# Patient Record
Sex: Male | Born: 1935 | Race: White | Hispanic: No | Marital: Married | Smoking: Never smoker
Health system: Southern US, Community
[De-identification: ages and names within clinical notes are randomized; demographics above are authoritative.]

## PROBLEM LIST (undated history)

## (undated) DIAGNOSIS — G4730 Sleep apnea, unspecified: Secondary | ICD-10-CM

## (undated) DIAGNOSIS — M1990 Unspecified osteoarthritis, unspecified site: Secondary | ICD-10-CM

## (undated) DIAGNOSIS — H409 Unspecified glaucoma: Secondary | ICD-10-CM

## (undated) DIAGNOSIS — Z91018 Allergy to other foods: Secondary | ICD-10-CM

## (undated) DIAGNOSIS — M109 Gout, unspecified: Secondary | ICD-10-CM

## (undated) DIAGNOSIS — M810 Age-related osteoporosis without current pathological fracture: Secondary | ICD-10-CM

## (undated) DIAGNOSIS — G629 Polyneuropathy, unspecified: Secondary | ICD-10-CM

## (undated) DIAGNOSIS — I10 Essential (primary) hypertension: Secondary | ICD-10-CM

## (undated) DIAGNOSIS — I509 Heart failure, unspecified: Secondary | ICD-10-CM

## (undated) DIAGNOSIS — I4891 Unspecified atrial fibrillation: Secondary | ICD-10-CM

## (undated) DIAGNOSIS — C801 Malignant (primary) neoplasm, unspecified: Secondary | ICD-10-CM

## (undated) DIAGNOSIS — K219 Gastro-esophageal reflux disease without esophagitis: Secondary | ICD-10-CM

## (undated) DIAGNOSIS — R011 Cardiac murmur, unspecified: Secondary | ICD-10-CM

## (undated) DIAGNOSIS — I350 Nonrheumatic aortic (valve) stenosis: Secondary | ICD-10-CM

## (undated) DIAGNOSIS — J45909 Unspecified asthma, uncomplicated: Secondary | ICD-10-CM

## (undated) HISTORY — DX: Age-related osteoporosis without current pathological fracture: M81.0

## (undated) HISTORY — DX: Polyneuropathy, unspecified: G62.9

## (undated) HISTORY — DX: Gout, unspecified: M10.9

## (undated) HISTORY — DX: Unspecified glaucoma: H40.9

## (undated) HISTORY — DX: Unspecified atrial fibrillation: I48.91

## (undated) HISTORY — DX: Malignant (primary) neoplasm, unspecified: C80.1

## (undated) HISTORY — DX: Gastro-esophageal reflux disease without esophagitis: K21.9

## (undated) HISTORY — DX: Unspecified asthma, uncomplicated: J45.909

## (undated) HISTORY — PX: JOINT REPLACEMENT: SHX530

## (undated) HISTORY — PX: OTHER SURGICAL HISTORY: SHX169

## (undated) HISTORY — DX: Unspecified osteoarthritis, unspecified site: M19.90

## (undated) HISTORY — DX: Essential (primary) hypertension: I10

## (undated) HISTORY — PX: ARTHROPLASTY: SHX135

## (undated) HISTORY — PX: BUNIONECTOMY: SHX129

---

## 2000-03-30 DIAGNOSIS — C801 Malignant (primary) neoplasm, unspecified: Secondary | ICD-10-CM

## 2000-03-30 HISTORY — DX: Malignant (primary) neoplasm, unspecified: C80.1

## 2005-05-28 ENCOUNTER — Ambulatory Visit: Payer: Self-pay | Admitting: *Deleted

## 2007-01-25 ENCOUNTER — Ambulatory Visit: Payer: Self-pay | Admitting: *Deleted

## 2007-05-02 ENCOUNTER — Ambulatory Visit: Payer: Self-pay | Admitting: *Deleted

## 2009-09-24 ENCOUNTER — Ambulatory Visit: Payer: Self-pay | Admitting: Orthopedic Surgery

## 2009-12-26 ENCOUNTER — Ambulatory Visit: Payer: Self-pay | Admitting: Neurology

## 2009-12-31 ENCOUNTER — Encounter: Admission: RE | Admit: 2009-12-31 | Discharge: 2009-12-31 | Payer: Self-pay | Admitting: Neurology

## 2010-06-04 ENCOUNTER — Ambulatory Visit: Payer: Self-pay | Admitting: Family Medicine

## 2010-07-19 ENCOUNTER — Ambulatory Visit: Payer: Self-pay | Admitting: Internal Medicine

## 2010-09-08 ENCOUNTER — Ambulatory Visit: Payer: Self-pay | Admitting: Unknown Physician Specialty

## 2010-09-09 LAB — PATHOLOGY REPORT

## 2011-02-23 ENCOUNTER — Ambulatory Visit: Payer: Self-pay | Admitting: Ophthalmology

## 2011-02-23 DIAGNOSIS — I4891 Unspecified atrial fibrillation: Secondary | ICD-10-CM

## 2011-02-25 ENCOUNTER — Ambulatory Visit: Payer: Self-pay | Admitting: Ophthalmology

## 2011-05-01 ENCOUNTER — Ambulatory Visit: Payer: Self-pay | Admitting: Family Medicine

## 2011-05-13 ENCOUNTER — Ambulatory Visit: Payer: Self-pay | Admitting: Ophthalmology

## 2012-05-18 ENCOUNTER — Ambulatory Visit: Payer: Self-pay | Admitting: Family Medicine

## 2012-06-06 ENCOUNTER — Ambulatory Visit: Payer: Self-pay | Admitting: Family Medicine

## 2012-06-27 ENCOUNTER — Ambulatory Visit: Payer: Self-pay | Admitting: General Surgery

## 2012-09-22 ENCOUNTER — Ambulatory Visit: Payer: Self-pay | Admitting: Family Medicine

## 2013-07-13 ENCOUNTER — Encounter: Payer: Self-pay | Admitting: Otolaryngology

## 2013-07-28 ENCOUNTER — Encounter: Payer: Self-pay | Admitting: Otolaryngology

## 2014-01-29 ENCOUNTER — Ambulatory Visit: Payer: Self-pay | Admitting: Family Medicine

## 2014-07-22 NOTE — Op Note (Signed)
PATIENT NAME:  Donald Riley, Donald Riley MR#:  [id number] DATE OF BIRTH:  [birth year]  DATE OF PROCEDURE:  05/13/2011  PROCEDURES PERFORMED:  1. Pars plana vitrectomy of the right eye.  2. Internal limiting membrane peel of the right eye.  3. Focal retinal laser of the right eye.  4. Phacoemulsification intraocular lens insertion of the right eye.   PREOPERATIVE DIAGNOSES:  1. Epiretinal membrane of the right eye.  2. Retinal edema of the right eye.  3. Visually significant cataract, right eye.   POSTOPERATIVE DIAGNOSES:  1. Epiretinal membrane of the right eye.  2. Retinal edema of the right eye.  3. Visually significant cataract, right eye.  4. Retinal tear.   SURGEONS: 1. Teresa Pelton. Natia Fahmy, MD for vitrectomy, retinal laser and internal limiting membrane peel.  2. Leandrew Koyanagi, M.D. for phacoemulsification, intraocular lens insertion.   ANESTHESIA: Retrobulbar block of the right eye with monitored anesthesia care.   COMPLICATIONS: None.   ESTIMATED BLOOD LOSS: Less than 1 mL.   INDICATIONS FOR PROCEDURE: This is a patient who has slowly decreasing vision in his right eye. Examination reveals retinal edema associated with a macular pucker and epiretinal membrane. Examination also reveals a visually significant cataract. Risks, benefits and alteratives of the above procedure were discussed and the patient wished to proceed.   DETAILS OF PROCEDURE: After informed consent was obtained, the patient was brought into operative suite at [facility]. Patient was placed in supine position, was given a small dose of Alfenta and a retrobulbar block was performed on the right eye by Dr. Starling Manns without any complications. Right eye was prepped and draped by Dr. Wallace Going. After lid speculum was inserted phacoemulsification, intraocular lens insertion was performed by Dr. Wallace Going which he will dictate in a separate note.   Once the cataract surgery was completed,  attention was turned to the pars plana vitrectomy portion of the case. A 25-gauge trocar was placed inferotemporally through displaced conjunctiva in an oblique fashion 3 mm beyond the limbus. The infusion cannula was turned on and inserted through the trocar and secured into position with Steri-Strips. Two more trocars were placed in a similar fashion superotemporally and superonasally. The vitreous cutter and light pipe were introduced in the eye and a core vitrectomy was performed. Peripheral vitreous was trimmed for 360 degrees. At this time, it was noted that there was a very small retinal tear at approximately 11:30. Peripheral vitreous continued to be trimmed for 360 degrees. Once this was completed indocyanine green was injected onto the posterior and removed within 30 seconds. Epiretinal membrane was peeled for 360 degrees. Internal limiting membrane was then peeled for 360 degrees around the fovea. A scleral depressed exam was then performed for 360 degrees and only the original small retinal tear could be identified. Endolaser was introduced in the eye and three rows of laser was placed around this tear. A complete air-fluid exchange was then performed. All the trocars were removed and the wounds were noted to be airtight. Pressure was confirmed to be approximately 15 mmHg. The Toric lens was examined and its markings noted that it was in its perfect measured position. 5 mg of dexamethasone was given into the inferior fornix and the lid speculum was removed. The eye was cleaned and TobraDex was placed on the eye. A patch and shield were placed over the eye and the patient was taken to postanesthesia care with instructions to remain head up overnight.   ____________________________ Teresa Pelton. Starling Manns, MD mfa:cms  D: 05/13/2011 12:11:48 ET T: 05/13/2011 12:38:31 ET JOB#: [id number]  cc: Teresa Pelton. Starling Manns, MD, <Dictator> Coralee Rud MD ELECTRONICALLY SIGNED 05/20/2011 7:11

## 2014-07-22 NOTE — Op Note (Signed)
PATIENT NAME:  Donald Riley, Donald Riley MR#:  [id number] DATE OF BIRTH:  1933-11-02  DATE OF PROCEDURE:  05/13/2011  PREOPERATIVE DIAGNOSIS:  Senile cataract of the right eye.  POSTOPERATIVE DIAGNOSIS:  Senile cataract of the right eye.  PROCEDURE:  Phacoemulsification with posterior chamber with Toric intraocular lens placement of the right eye.   LENS:  SN6AT3 12.0-diopter posterior chamber Toric intraocular lens with 1.5 diopters of cylindrical power placed at axis 105 degrees.  ULTRASOUND TIME:  7.6% of 1 minute, 35 seconds, CDE 7.3.   SURGEON:  Mali Keimora Swartout, MD  ANESTHESIA:  Topical with tetracaine drops and 2% Xylocaine jelly.  COMPLICATIONS:  None.  DESCRIPTION OF PROCEDURE:  The patient was identified in the holding room and transported to the operating suite and placed in upright position.  The eye was marked so that the 0, 90 and 180 degree axis were clearly marked with purple ink.  Xylocaine gel was placed into the eye and then the eye was prepped and draped in the usual sterile ophthalmic fashion.  The operating microscope was placed into position.   A clear-corneal paracentesis incision was made at the 3 o'clock position.  The anterior chamber was filled with Viscoat.  A 2.6-millimeter near clear corneal incision was then made at the 12 o'clock position. A cystotome and capsulorrhexis forceps were then used to make a curvilinear capsulorrhexis.  Hydrodissect and hydrodelineation were then performed using balanced salt solution.   Phacoemulsification was then used in stop and chop fashion to remove the lens, nucleus and epinucleus.  The remaining cortex was aspirated using the irrigation and aspiration handpiece.  Provisc viscoelastic was then placed into the capsular bag to distend it for lens placement.  An axis marker was then dipped in purple ink and used to mark the axis position of 105 degrees for placement of the toric intraocular lens.  The 105 degree axis has been previously  determined by the toric lens calculator.   A SN6AT3 12.0-diopter lens was then injected into the capsular bag.  It was rotated clockwise until the axis marks on the lens were approximately 15 degrees in the counterclockwise direction to the 105 degree axis mark on the cornea.  The viscoelastic was aspirated from the eye using the irrigation aspiration handpiece.  Then, a Sinskey hook through the side-port incision was used to rotate the lens in a clockwise direction until the axis markings of the intraocular lens were lined up with the axis markings on the cornea.  Balanced salt solution was then used to hydrate the wounds.   The eye was noted to have a physiologic pressure and there was no wound leak noted.  A 10-0 nylon suture was placed through the center of the 2.6-mm incision. Upon completion of the cataract extraction and intraocular lens, Dr. Starling Manns then performed a posterior vitrectomy in its entirety. This is dictated separately by Dr. Starling Manns.   ____________________________ Wyonia Hough, MD crb:drc D: 05/13/2011 12:18:19 ET T: 05/13/2011 12:40:49 ET JOB#: [id number]  cc: Wyonia Hough, MD, <Dictator> Leandrew Koyanagi MD ELECTRONICALLY SIGNED 05/20/2011 13:07

## 2014-09-20 ENCOUNTER — Other Ambulatory Visit: Payer: Self-pay | Admitting: Physical Medicine and Rehabilitation

## 2014-09-20 DIAGNOSIS — M5416 Radiculopathy, lumbar region: Secondary | ICD-10-CM

## 2014-09-26 ENCOUNTER — Ambulatory Visit
Admission: RE | Admit: 2014-09-26 | Discharge: 2014-09-26 | Disposition: A | Discharge status: Home / Self Care | Payer: Medicare Other | Source: Ambulatory Visit | Attending: Physical Medicine and Rehabilitation | Admitting: Physical Medicine and Rehabilitation

## 2014-09-26 DIAGNOSIS — M4696 Unspecified inflammatory spondylopathy, lumbar region: Secondary | ICD-10-CM | POA: Diagnosis not present

## 2014-09-26 DIAGNOSIS — M5136 Other intervertebral disc degeneration, lumbar region: Secondary | ICD-10-CM | POA: Insufficient documentation

## 2014-09-26 DIAGNOSIS — M4697 Unspecified inflammatory spondylopathy, lumbosacral region: Secondary | ICD-10-CM | POA: Diagnosis not present

## 2014-09-26 DIAGNOSIS — M5416 Radiculopathy, lumbar region: Secondary | ICD-10-CM | POA: Diagnosis present

## 2014-09-26 DIAGNOSIS — M4806 Spinal stenosis, lumbar region: Secondary | ICD-10-CM | POA: Insufficient documentation

## 2014-12-25 ENCOUNTER — Encounter: Payer: Self-pay | Admitting: Podiatry

## 2014-12-25 ENCOUNTER — Ambulatory Visit (INDEPENDENT_AMBULATORY_CARE_PROVIDER_SITE_OTHER): Payer: Medicare Other | Admitting: Podiatry

## 2014-12-25 VITALS — BP 124/84 | HR 64 | Resp 18

## 2014-12-25 DIAGNOSIS — B079 Viral wart, unspecified: Secondary | ICD-10-CM | POA: Diagnosis not present

## 2014-12-25 DIAGNOSIS — L84 Corns and callosities: Secondary | ICD-10-CM

## 2014-12-25 DIAGNOSIS — B078 Other viral warts: Secondary | ICD-10-CM

## 2014-12-25 NOTE — Progress Notes (Signed)
   Subjective:    Patient ID: Donald Riley, male    DOB: 1933/12/08, 81 y.o.   MRN: [id number]  HPI  81 year old male presents the office with concerns of a callus in the ball of his left foot is been ongoing for greater than one year. He states he gets a pedicure typically has the area trim down. He has pain to the area as it builds up. He previously had a bunionectomy performed approximately 20 years ago. He denies any redness or drainage from the area. No other complaints at this time.    Review of Systems  Constitutional: Positive for activity change and fatigue.  HENT: Positive for hearing loss.        RINGING IN EARS  Genitourinary:       BLOOD IN URINE  Musculoskeletal: Positive for back pain and gait problem.       MUSCLE AND JOINT PAIN  Hematological: Bruises/bleeds easily.  All other systems reviewed and are negative.      Objective:   Physical Exam AAO x3, NAD DP/PT pulses palpable bilaterally, CRT less than 3 seconds Protective sensation decreased with Simms Weinstein monofilament There is a hyperkeratotic lesion left foot submetatarsal one. Upon debridement no underlying ulceration, drainage or other signs of infection. There is a scar from prior bunionectomy. Hallux it and abducted position the decreased range of motion and plantar flexion. There is good range of motion dorsiflexion of the first MTPJ. There is no swelling erythema or drainage. The distal aspect of the left fourth toe is hyperkeratotic lesion with pinpoint bleeding and evidence of verruca. There is no drainage or purulence. There is mostly erythema or pain to the lesion. No other lesions identified bilaterally. No areas of tenderness to bilateral lower extremities. MMT 5/5, ROM WNL.  No open lesions or pre-ulcerative lesions.  No overlying edema, erythema, increase in warmth to bilateral lower extremities.  No pain with calf compression, swelling, warmth, erythema bilaterally.      Assessment & Plan:    81 year old male left foot submetatarsal 1 hyperkeratotic lesion, fourth toe verruca -Treatment options discussed including all alternatives, risks, and complications -hyperkeratotic lesion sharply debrided x1 left foot submetatarsal one. Offloading pads applied to the patient's orthotic. Dispense other offloading pads. May need to consider new orthotics in the near future. -Left fourth toe lesion sharply debrided without complications. There is evidence of underlying verruca. Area was cleaned. A pad was place around the the lesion followed by salinocaine and a bandage. Post procedure tear was discussed the patient. The lesion does not heal within 2 weeks or call the office for followup appointment. Monitor for any clinical signs or symptoms of infection and directed to call the office immediately should any occur or go to the ER. Follow-up with PCP for other issues mentioned in the ROS.   Celesta Gentile, DPM

## 2014-12-26 ENCOUNTER — Encounter: Payer: Self-pay | Admitting: Podiatry

## 2015-06-12 ENCOUNTER — Other Ambulatory Visit: Payer: Self-pay | Admitting: Cardiology

## 2015-07-02 ENCOUNTER — Encounter: Payer: Self-pay | Admitting: *Deleted

## 2015-07-02 ENCOUNTER — Ambulatory Visit
Admission: RE | Admit: 2015-07-02 | Discharge: 2015-07-02 | Disposition: A | Discharge status: Home / Self Care | Payer: Medicare Other | Source: Ambulatory Visit | Attending: Cardiology | Admitting: Cardiology

## 2015-07-02 ENCOUNTER — Encounter
Admission: RE | Disposition: A | Discharge status: Home / Self Care | Payer: Self-pay | Source: Ambulatory Visit | Attending: Cardiology

## 2015-07-02 DIAGNOSIS — I2510 Atherosclerotic heart disease of native coronary artery without angina pectoris: Secondary | ICD-10-CM | POA: Diagnosis not present

## 2015-07-02 DIAGNOSIS — K449 Diaphragmatic hernia without obstruction or gangrene: Secondary | ICD-10-CM | POA: Diagnosis not present

## 2015-07-02 DIAGNOSIS — I4891 Unspecified atrial fibrillation: Secondary | ICD-10-CM | POA: Insufficient documentation

## 2015-07-02 DIAGNOSIS — I350 Nonrheumatic aortic (valve) stenosis: Secondary | ICD-10-CM | POA: Insufficient documentation

## 2015-07-02 DIAGNOSIS — J4520 Mild intermittent asthma, uncomplicated: Secondary | ICD-10-CM | POA: Insufficient documentation

## 2015-07-02 DIAGNOSIS — Z7901 Long term (current) use of anticoagulants: Secondary | ICD-10-CM | POA: Insufficient documentation

## 2015-07-02 DIAGNOSIS — G629 Polyneuropathy, unspecified: Secondary | ICD-10-CM | POA: Insufficient documentation

## 2015-07-02 DIAGNOSIS — I10 Essential (primary) hypertension: Secondary | ICD-10-CM | POA: Diagnosis not present

## 2015-07-02 DIAGNOSIS — R011 Cardiac murmur, unspecified: Secondary | ICD-10-CM | POA: Insufficient documentation

## 2015-07-02 HISTORY — PX: CARDIAC CATHETERIZATION: SHX172

## 2015-07-02 HISTORY — DX: Cardiac murmur, unspecified: R01.1

## 2015-07-02 HISTORY — DX: Sleep apnea, unspecified: G47.30

## 2015-07-02 SURGERY — RIGHT/LEFT HEART CATH AND CORONARY ANGIOGRAPHY
Anesthesia: Moderate Sedation

## 2015-07-02 MED ORDER — SODIUM CHLORIDE 0.9 % WEIGHT BASED INFUSION: 3.0000 mL/kg/h | INTRAVENOUS | Status: DC

## 2015-07-02 MED ORDER — HEPARIN (PORCINE) IN NACL 2-0.9 UNIT/ML-% IJ SOLN
INTRAMUSCULAR | Status: AC
  Filled 2015-07-02: qty 500

## 2015-07-02 MED ORDER — MIDAZOLAM HCL 2 MG/2ML IJ SOLN
INTRAMUSCULAR | Status: DC | PRN
  Administered 2015-07-02: 0.5 mg via INTRAVENOUS
  Administered 2015-07-02: 1 mg via INTRAVENOUS

## 2015-07-02 MED ORDER — SODIUM CHLORIDE 0.9% FLUSH: 3.0000 mL | INTRAVENOUS | Status: DC | PRN

## 2015-07-02 MED ORDER — MIDAZOLAM HCL 2 MG/2ML IJ SOLN
INTRAMUSCULAR | Status: AC
  Filled 2015-07-02: qty 2

## 2015-07-02 MED ORDER — SODIUM CHLORIDE 0.9% FLUSH
3.0000 mL | Freq: Two times a day (BID) | INTRAVENOUS | Status: DC
  Administered 2015-07-02: 3 mL via INTRAVENOUS

## 2015-07-02 MED ORDER — SODIUM CHLORIDE 0.9% FLUSH: 3.0000 mL | Freq: Two times a day (BID) | INTRAVENOUS | Status: DC

## 2015-07-02 MED ORDER — SODIUM CHLORIDE 0.9 % WEIGHT BASED INFUSION
1.0000 mL/kg/h | INTRAVENOUS | Status: DC
  Administered 2015-07-02: 1 mL/kg/h via INTRAVENOUS

## 2015-07-02 MED ORDER — FENTANYL CITRATE (PF) 100 MCG/2ML IJ SOLN
INTRAMUSCULAR | Status: AC
  Filled 2015-07-02: qty 2

## 2015-07-02 MED ORDER — ASPIRIN 81 MG PO CHEW: 81.0000 mg | CHEWABLE_TABLET | ORAL | Status: DC

## 2015-07-02 MED ORDER — SODIUM CHLORIDE 0.9 % IV SOLN: 250.0000 mL | INTRAVENOUS | Status: DC | PRN

## 2015-07-02 MED ORDER — IOPAMIDOL (ISOVUE-300) INJECTION 61%
INTRAVENOUS | Status: DC | PRN
  Administered 2015-07-02: 100 mL via INTRA_ARTERIAL

## 2015-07-02 MED ORDER — FENTANYL CITRATE (PF) 100 MCG/2ML IJ SOLN
INTRAMUSCULAR | Status: DC | PRN
  Administered 2015-07-02 (×2): 25 ug via INTRAVENOUS

## 2015-07-02 SURGICAL SUPPLY — 14 items
CATH INFINITI 5FR ANG PIGTAIL (CATHETERS) ×3 IMPLANT
CATH INFINITI 5FR JL4 (CATHETERS) ×3 IMPLANT
CATH INFINITI JR4 5F (CATHETERS) ×3 IMPLANT
CATH SWANZ 7F THERMO (CATHETERS) ×3 IMPLANT
GUIDEWIRE EMER 3M J .025X150CM (WIRE) ×3 IMPLANT
KIT MANI 3VAL PERCEP (MISCELLANEOUS) ×3 IMPLANT
KIT RIGHT HEART (MISCELLANEOUS) ×3 IMPLANT
NEEDLE PERC 18GX7CM (NEEDLE) ×3 IMPLANT
PACK CARDIAC CATH (CUSTOM PROCEDURE TRAY) ×3 IMPLANT
SHEATH AVANTI 6FR X 11CM (SHEATH) ×3 IMPLANT
SHEATH PINNACLE 5F 10CM (SHEATH) IMPLANT
SHEATH PINNACLE 7F 10CM (SHEATH) ×3 IMPLANT
WIRE EMERALD 3MM-J .035X150CM (WIRE) ×3 IMPLANT
WIRE EMERALD ST .035X150CM (WIRE) ×3 IMPLANT

## 2015-07-02 NOTE — H&P (Signed)
normal pulsation bilaterally Bruit: None Apex: apical impulse normal  Auscultation Rhythm: normal sinus rhythm S1: normal S2: normal Clicks: no Rub: no Murmurs: 2-3/6 medium pitched mid systolic musical at lower left sternal border  Gallop: None ABDOMEN Liver enlargement: no Pulsatile aorta: no Ascites: no Bruits: no  EXTREMITIES Clubbing: no Edema: 1+ bilateral pedal edema Pulses: peripheral pulses symmetrical Femoral Bruits: no Amputation: no SKIN Rash: no Cyanosis: no Embolic phemonenon: no Bruising: no NEURO Alert and Oriented: yes Non focal: yes LABS Last 3 CBC results: Lab Results  Component Value Date  WBC 6.5 12/06/2013  WBC 7.0 06/20/2013  WBC 15.0 (H) 05/31/2013   Lab Results  Component Value Date  HGB 14.6 12/06/2013  HGB 15.4 06/20/2013  HGB 15.8 05/31/2013   Lab Results  Component Value Date  HCT 0.44 12/06/2013  HCT 0.48 06/20/2013  HCT 0.49 05/31/2013   Lab Results  Component Value Date  MCV 100 (H) 12/06/2013  MCV 102 (H) 06/20/2013  MCV 102 (H) 05/31/2013   Lab Results  Component Value Date  PLT 193 12/06/2013  PLT 182 06/20/2013  PLT 224 05/31/2013   Lab Results  Component Value Date  CREATININE 0.9 03/15/2015  BUN 16 03/15/2015  NA 139 03/15/2015  K 4.8 03/15/2015  CL 103 03/15/2015  CO2 30 03/15/2015   Lab Results  Component Value Date  HDL 103 03/15/2015  HDL 102 01/24/2014  HDL 109 03/09/2013   Lab Results  Component Value Date  LDLCALC 126 03/15/2015  LDLCALC 122 01/24/2014  LDLCALC 81 03/09/2013   Lab Results  Component Value Date  TRIG 58 03/15/2015  TRIG 53 01/24/2014  TRIG 54 03/09/2013   Lab Results  Component Value Date  ALT 18 03/15/2015  AST 20 03/15/2015  ALKPHOS 80 03/15/2015   Lab Results  Component Value Date  TSH 0.99 12/06/2013   Assessment   82 y.o. male with  1. A-fib  2. Atrial fibrillation  Atrial fibrillation rate is well controlled. He remains in sinus rhythm. He is  tolerating current medical regimen including flecainide without any difficulty. He has had no breakthrough arrhythmias. His rate is controlled with diltiazem. He remains on Eliquis for anticoagulation. Will need to come off Eliquis 2 days prior to left cardiac cath.  3. Essential hypertension, benign  Blood pressure is under control  4. Essential hypertension  5. Asthma, mild intermittent, uncomplicated  6. Hiatal hernia  7. Peripheral neuropathy  8. Systolic murmur-echo shows evidence of moderate aortic stenosis. We will proceed with left and right heart cath to better assess the aortic valve anatomy and to evaluate for evidence of coronary disease to guide further therapy to consider S AVR versus T aVR.  Plan   1. Continue with current medications 2. Low fat low cholesterol sodium diet 3. Receive a left to right heart cath to evaluate coronary anatomy and better evaluation of the aortic stenosis. 4. Avoid activity causing symptoms 5. Continue with Eliquis at current dose, will stop 2 days prior to cardiac catheterization 6. Continue with flecainide and metoprolol  Orders Placed This Encounter  Procedures  . CBC w/auto Differential (5 Part)  . Basic Metabolic Panel (BMP)  . Prothrombin Time (INR)   No Follow-up on file.  These notes generated with voice recognition software. I apologize for typographical errors.  Sydnee Levans, MD

## 2015-07-02 NOTE — Discharge Instructions (Signed)

## 2016-01-13 ENCOUNTER — Encounter: Payer: Self-pay | Admitting: Emergency Medicine

## 2016-01-13 ENCOUNTER — Emergency Department
Admission: EM | Admit: 2016-01-13 | Discharge: 2016-01-14 | Payer: Medicare Other | Attending: Student in an Organized Health Care Education/Training Program | Admitting: Student in an Organized Health Care Education/Training Program

## 2016-01-13 DIAGNOSIS — J45909 Unspecified asthma, uncomplicated: Secondary | ICD-10-CM | POA: Insufficient documentation

## 2016-01-13 DIAGNOSIS — K625 Hemorrhage of anus and rectum: Secondary | ICD-10-CM | POA: Diagnosis present

## 2016-01-13 DIAGNOSIS — Z791 Long term (current) use of non-steroidal anti-inflammatories (NSAID): Secondary | ICD-10-CM | POA: Insufficient documentation

## 2016-01-13 DIAGNOSIS — Z7952 Long term (current) use of systemic steroids: Secondary | ICD-10-CM | POA: Insufficient documentation

## 2016-01-13 DIAGNOSIS — K922 Gastrointestinal hemorrhage, unspecified: Secondary | ICD-10-CM | POA: Insufficient documentation

## 2016-01-13 DIAGNOSIS — Z859 Personal history of malignant neoplasm, unspecified: Secondary | ICD-10-CM | POA: Diagnosis not present

## 2016-01-13 DIAGNOSIS — Z79899 Other long term (current) drug therapy: Secondary | ICD-10-CM | POA: Insufficient documentation

## 2016-01-13 LAB — TYPE AND SCREEN
ABO/RH(D): B POS
Antibody Screen: NEGATIVE

## 2016-01-13 LAB — CBC WITH DIFFERENTIAL/PLATELET
Basophils Absolute: 0.1 10*3/uL (ref 0–0.1)
Basophils Relative: 1 %
Eosinophils Absolute: 0.2 10*3/uL (ref 0–0.7)
Eosinophils Relative: 2 %
HCT: 39.7 % — ABNORMAL LOW (ref 40.0–52.0)
Hemoglobin: 13.3 g/dL (ref 13.0–18.0)
Lymphocytes Relative: 19 %
Lymphs Abs: 1.5 10*3/uL (ref 1.0–3.6)
MCH: 32.9 pg (ref 26.0–34.0)
MCHC: 33.4 g/dL (ref 32.0–36.0)
MCV: 98.4 fL (ref 80.0–100.0)
Monocytes Absolute: 0.8 10*3/uL (ref 0.2–1.0)
Monocytes Relative: 10 %
Neutro Abs: 5.5 10*3/uL (ref 1.4–6.5)
Neutrophils Relative %: 68 %
Platelets: 148 10*3/uL — ABNORMAL LOW (ref 150–440)
RBC: 4.03 MIL/uL — ABNORMAL LOW (ref 4.40–5.90)
RDW: 14.2 % (ref 11.5–14.5)
WBC: 8 10*3/uL (ref 3.8–10.6)

## 2016-01-13 LAB — COMPREHENSIVE METABOLIC PANEL
ALT: 16 U/L — ABNORMAL LOW (ref 17–63)
AST: 22 U/L (ref 15–41)
Albumin: 3.3 g/dL — ABNORMAL LOW (ref 3.5–5.0)
Alkaline Phosphatase: 71 U/L (ref 38–126)
Anion gap: 6 (ref 5–15)
BUN: 32 mg/dL — ABNORMAL HIGH (ref 6–20)
CO2: 24 mmol/L (ref 22–32)
Calcium: 8.7 mg/dL — ABNORMAL LOW (ref 8.9–10.3)
Chloride: 108 mmol/L (ref 101–111)
Creatinine, Ser: 1.01 mg/dL (ref 0.61–1.24)
GFR calc Af Amer: >60 mL/min (ref >60)
GFR calc non Af Amer: >60 mL/min (ref >60)
Glucose, Bld: 134 mg/dL — ABNORMAL HIGH (ref 65–99)
Potassium: 4.6 mmol/L (ref 3.5–5.1)
Sodium: 138 mmol/L (ref 135–145)
Total Bilirubin: 0.6 mg/dL (ref 0.3–1.2)
Total Protein: 5.8 g/dL — ABNORMAL LOW (ref 6.5–8.1)

## 2016-01-13 LAB — LACTIC ACID, PLASMA: Lactic Acid, Venous: 1.4 mmol/L (ref 0.5–1.9)

## 2016-01-13 LAB — PROTIME-INR
INR: 1.27
Prothrombin Time: 16 seconds — ABNORMAL HIGH (ref 11.4–15.2)

## 2016-01-13 MED ORDER — SODIUM CHLORIDE 0.9 % IV SOLN
80.0000 mg | Freq: Once | INTRAVENOUS | Status: AC
  Administered 2016-01-13: 80 mg via INTRAVENOUS
  Filled 2016-01-13: qty 80

## 2016-01-13 NOTE — ED Notes (Signed)
Pt comes in reporting bright red rectal bleeding that started at approximately 1830 this evening. Pt denies any abd pain, but reports feeling a "gurgling" sensation in his lower abd. Pt is tender to palpation in the lower quadrants. Pt reports that he has lost count of how many bloody stools he has had prior to coming in. Pt denies any chest pain, SOB, dizziness, N/V.

## 2016-01-13 NOTE — ED Triage Notes (Addendum)
Pt arrived to triage by wheelchair from bathroom with EDT. Pt filled toilet with bright red blood, with clots, assessed/seen by this RN. Pt taken to RM 17. Pt sts bleeding started this afternoon and denies any other symptom. Pt currently taking Eliquis.

## 2016-01-13 NOTE — ED Provider Notes (Signed)
[facility] Emergency Department Provider Note    First MD Initiated Contact with Patient 01/13/16 2127     (approximate)  I have reviewed the triage vital signs and the nursing notes.   HISTORY  Chief Complaint Rectal Bleeding    HPI Donald Riley is a 82 y.o. male  With a history  of Diverticulosis  As well a atrial fibillation on Eliquis.   Who presents with acute left lower quadrant pain and bright red blood per rectum.  Patient denies any recent fevers. Denies any nausea or vomiting. States the symptoms started at just after 6 PM. Has had over 8 episodes of bright red blood per rectum that fill of the toilet and have associated clots.  Patient went to the bathroom here in the ED and filled the commode with sanguinous stool.  Past Medical History:  Diagnosis Date  . Arthritis   . Asthma   . Atrial fibrillation ([place])   . Cancer ([place])   . GERD (gastroesophageal reflux disease)   . Glaucoma   . Gout   . Heart murmur   . Hypertension   . Neuropathy ([place])   . Osteoporosis   . Sleep apnea     There are no active problems to display for this patient.   Past Surgical History:  Procedure Laterality Date  . ARTHROPLASTY Right    ankle  . BUNIONECTOMY    . CARDIAC CATHETERIZATION N/A 07/02/2015   Procedure: Right/Left Heart Cath and Coronary Angiography;  Surgeon: Teodoro Spray, MD;  Location: [place] [facility];  Service: Cardiovascular;  Laterality: N/A;  . JOINT REPLACEMENT    . ostatectomy      Prior to Admission medications   Medication Sig Start Date End Date Taking? Authorizing Provider  albuterol (VENTOLIN HFA) 108 (90 BASE) MCG/ACT inhaler Inhale into the lungs every 4 (four) hours as needed.  01/30/14   Historical Provider, MD  alendronate (FOSAMAX) 70 MG tablet Take 70 mg by mouth. Reported on 07/02/2015    Historical Provider, MD  apixaban (ELIQUIS) 5 MG TABS tablet Take by mouth. 07/18/14   Historical Provider, MD  ARTIFICIAL TEARS  0.1-0.3 % SOLN Reported on 07/02/2015 05/19/12   Historical Provider, MD  beta carotene w/minerals (OCUVITE) tablet Take 1 tablet by mouth.    Historical Provider, MD  brimonidine-timolol (COMBIGAN) 0.2-0.5 % ophthalmic solution 1 drop 2 (two) times daily.    Historical Provider, MD  Calcium Carb-Cholecalciferol (CALCIUM PLUS VITAMIN D3) 600-500 MG-UNIT CAPS Take by mouth 2 (two) times daily.  05/19/12   Historical Provider, MD  colchicine (COLCRYS) 0.6 MG tablet Take 0.6 mg by mouth. 01/31/13   Historical Provider, MD  diltiazem (CARDIZEM CD) 240 MG 24 hr capsule Reported on 07/02/2015 12/19/14   Historical Provider, MD  flecainide (TAMBOCOR) 50 MG tablet Take 100 mg by mouth. Reported on 07/02/2015 05/19/12   Historical Provider, MD  glucosamine-chondroitin (GLUCOSAMINE-CHONDROITIN DS) 500-400 MG tablet Take by mouth.    Historical Provider, MD  Hydrocodone-Acetaminophen 5-300 MG TABS Take by mouth.    Historical Provider, MD  ketotifen (ZADITOR) 0.025 % ophthalmic solution  05/19/12   Historical Provider, MD  latanoprost (XALATAN) 0.005 % ophthalmic solution Reported on 07/02/2015 01/30/13   Historical Provider, MD  losartan (COZAAR) 100 MG tablet Take 100 mg by mouth. Reported on 07/02/2015 05/28/11   Historical Provider, MD  Lutein 20 MG CAPS Take 20 mg by mouth. 05/19/12   Historical Provider, MD  meloxicam (MOBIC) 15 MG  tablet Take 15 mg by mouth. Reported on 07/02/2015 05/28/11   Historical Provider, MD  metoprolol succinate (TOPROL-XL) 25 MG 24 hr tablet Take 25 mg by mouth. Reported on 07/02/2015 05/19/12   Historical Provider, MD  mometasone [facility]) 220 MCG/INH inhaler Inhale 2 puffs into the lungs daily.    Historical Provider, MD  mometasone-formoterol (DULERA) 200-5 MCG/ACT AERO Inhale into the lungs. Reported on 07/02/2015 01/30/14   Historical Provider, MD  pantoprazole (PROTONIX) 40 MG tablet Take 40 mg by mouth. Reported on 07/02/2015 05/19/12   Historical Provider, MD  pramipexole (MIRAPEX) 0.25 MG tablet  Take 0.25 mg by mouth.     Historical Provider, MD  predniSONE (DELTASONE) 20 MG tablet For asthma flare take 60 mg/day for 5 days, 40 mg/d x 5 days, 120 mg/d x 5 days and 10 mg/d x 5 days. 08/01/13   Historical Provider, MD  ranitidine (ZANTAC) 150 MG tablet  12/19/14   Historical Provider, MD  rosuvastatin (CRESTOR) 40 MG tablet Take 140 mg by mouth. Reported on 07/02/2015    Historical Provider, MD  tiotropium (SPIRIVA HANDIHALER) 18 MCG inhalation capsule Place into inhaler and inhale. Reported on 07/02/2015 01/30/14   Historical Provider, MD  traMADol (ULTRAM) 50 MG tablet 50 mg. 03/08/13   Historical Provider, MD    Allergies Review of patient's allergies indicates no known allergies.  History reviewed. No pertinent family history.  Social History Social History  Substance Use Topics  . Smoking status: Never Smoker  . Smokeless tobacco: Never Used  . Alcohol use No    Review of Systems Patient denies headaches, rhinorrhea, blurry vision, numbness, shortness of breath, chest pain, edema, cough, abdominal pain, nausea, vomiting, diarrhea, dysuria, fevers, rashes or hallucinations unless otherwise stated above in HPI. ____________________________________________   PHYSICAL EXAM:  VITAL SIGNS: Vitals:   01/13/16 2127  BP: 127/83  Pulse: 72  Resp: 18  Temp: 98.3 F (36.8 C)    Constitutional: Alert and oriented. Well appearing and in no acute distress. Eyes: Conjunctivae are normal. PERRL. EOMI. Head: Atraumatic. Nose: No congestion/rhinnorhea. Mouth/Throat: Mucous membranes are moist.  Oropharynx non-erythematous. Neck: No stridor. Painless ROM. No cervical spine tenderness to palpation Hematological/Lymphatic/Immunilogical: No cervical lymphadenopathy. Cardiovascular: Normal rate, regular rhythm. Grossly normal heart sounds.  Good peripheral circulation. Respiratory: Normal respiratory effort.  No retractions. Lungs CTAB. Gastrointestinal: Soft and nontender. No distention.  No abdominal bruits. No CVA tenderness. Musculoskeletal: No lower extremity tenderness nor edema.  No joint effusions. Neurologic:  Normal speech and language. No gross focal neurologic deficits are appreciated. No gait instability. Skin:  Skin is warm, dry and intact. No rash noted. Psychiatric: Mood and affect are normal. Speech and behavior are normal.  ____________________________________________   LABS (all labs ordered are listed, but only abnormal results are displayed)  Results for orders placed or performed during the hospital encounter of 01/13/16 (from the past 24 hour(s))  CBC with Differential/Platelet     Status: Abnormal   Collection Time: 01/13/16  9:33 PM  Result Value Ref Range   WBC 8.0 3.8 - 10.6 K/uL   RBC 4.03 (L) 4.40 - 5.90 MIL/uL   Hemoglobin 13.3 13.0 - 18.0 g/dL   HCT 39.7 (L) 40.0 - 52.0 %   MCV 98.4 80.0 - 100.0 fL   MCH 32.9 26.0 - 34.0 pg   MCHC 33.4 32.0 - 36.0 g/dL   RDW 14.2 11.5 - 14.5 %   Platelets 148 (L) 150 - 440 K/uL   Neutrophils Relative % 68 %  Neutro Abs 5.5 1.4 - 6.5 K/uL   Lymphocytes Relative 19 %   Lymphs Abs 1.5 1.0 - 3.6 K/uL   Monocytes Relative 10 %   Monocytes Absolute 0.8 0.2 - 1.0 K/uL   Eosinophils Relative 2 %   Eosinophils Absolute 0.2 0 - 0.7 K/uL   Basophils Relative 1 %   Basophils Absolute 0.1 0 - 0.1 K/uL  Comprehensive metabolic panel     Status: Abnormal   Collection Time: 01/13/16  9:33 PM  Result Value Ref Range   Sodium 138 135 - 145 mmol/L   Potassium 4.6 3.5 - 5.1 mmol/L   Chloride 108 101 - 111 mmol/L   CO2 24 22 - 32 mmol/L   Glucose, Bld 134 (H) 65 - 99 mg/dL   BUN 32 (H) 6 - 20 mg/dL   Creatinine, Ser 1.01 0.61 - 1.24 mg/dL   Calcium 8.7 (L) 8.9 - 10.3 mg/dL   Total Protein 5.8 (L) 6.5 - 8.1 g/dL   Albumin 3.3 (L) 3.5 - 5.0 g/dL   AST 22 15 - 41 U/L   ALT 16 (L) 17 - 63 U/L   Alkaline Phosphatase 71 38 - 126 U/L   Total Bilirubin 0.6 0.3 - 1.2 mg/dL   GFR calc non Af Amer >60 >60 mL/min    GFR calc Af Amer >60 >60 mL/min   Anion gap 6 5 - 15  Lactic acid, plasma     Status: None   Collection Time: 01/13/16  9:33 PM  Result Value Ref Range   Lactic Acid, Venous 1.4 0.5 - 1.9 mmol/L  Type and screen [facility]     Status: None (Preliminary result)   Collection Time: 01/13/16  9:33 PM  Result Value Ref Range   ABO/RH(D) PENDING    Antibody Screen PENDING    Sample Expiration 01/16/2016   Protime-INR     Status: Abnormal   Collection Time: 01/13/16  9:33 PM  Result Value Ref Range   Prothrombin Time 16.0 (H) 11.4 - 15.2 seconds   INR 1.27    ____________________________________________ ____________________________________________  RADIOLOGY  ________________________________________   PROCEDURES  Procedure(s) performed: none    Critical Care performed: no ____________________________________________   INITIAL IMPRESSION / ASSESSMENT AND PLAN / ED COURSE  Pertinent labs & imaging results that were available during my care of the patient were reviewed by me and considered in my medical decision making (see chart for details).  DDX: gi bleed, diverticular bleed, mass, ischemia, ulcerative bleed  JERMIAH LAMPING is a 82 y.o. who presents to the ED with Bright red blood per rectum. Patient is hemodynamic stable and does not have any signs or symptoms of acute blood loss anemia as of yet. Patient with multiple episodes of bright red blood per rectum but remains normotensive. Has a known history of diverticulosis.  Also has a history of reflux but given his left lower quadrant pain and bright red blood and he suspect that this is secondary to diverticular process. Patient be given IV Protonix bolus. I have spoken with Dr. Evelena Leyden at [facility] who agrees to accept patient to the ER for GI consultation.  Have discussed with the patient and available family all diagnostics and treatments performed thus far and all questions were answered to the best  of my ability. The patient demonstrates understanding and agreement with plan.   Clinical Course     ____________________________________________   FINAL CLINICAL IMPRESSION(S) / ED DIAGNOSES  Final diagnoses:  Acute GI bleeding      NEW MEDICATIONS STARTED DURING THIS VISIT:  New Prescriptions   No medications on file     Note:  This document was prepared using Dragon voice recognition software and may include unintentional dictation errors.    Merlyn Lot, MD 01/14/16 [phone]

## 2016-05-21 ENCOUNTER — Other Ambulatory Visit: Payer: Self-pay | Admitting: Family Medicine

## 2016-05-21 DIAGNOSIS — M810 Age-related osteoporosis without current pathological fracture: Secondary | ICD-10-CM

## 2016-09-30 ENCOUNTER — Inpatient Hospital Stay
Admission: EM | Admit: 2016-09-30 | Discharge: 2016-10-06 | DRG: 981 | Disposition: A | Discharge status: Home Health | Payer: Medicare Other | Attending: Internal Medicine | Admitting: Internal Medicine

## 2016-09-30 ENCOUNTER — Inpatient Hospital Stay: Payer: Medicare Other | Admitting: Anesthesiology

## 2016-09-30 ENCOUNTER — Emergency Department: Payer: Medicare Other

## 2016-09-30 DIAGNOSIS — H9190 Unspecified hearing loss, unspecified ear: Secondary | ICD-10-CM | POA: Diagnosis present

## 2016-09-30 DIAGNOSIS — J9600 Acute respiratory failure, unspecified whether with hypoxia or hypercapnia: Secondary | ICD-10-CM | POA: Diagnosis not present

## 2016-09-30 DIAGNOSIS — I4581 Long QT syndrome: Secondary | ICD-10-CM | POA: Diagnosis present

## 2016-09-30 DIAGNOSIS — T782XXA Anaphylactic shock, unspecified, initial encounter: Secondary | ICD-10-CM | POA: Diagnosis present

## 2016-09-30 DIAGNOSIS — Z8546 Personal history of malignant neoplasm of prostate: Secondary | ICD-10-CM

## 2016-09-30 DIAGNOSIS — K219 Gastro-esophageal reflux disease without esophagitis: Secondary | ICD-10-CM | POA: Diagnosis present

## 2016-09-30 DIAGNOSIS — I236 Thrombosis of atrium, auricular appendage, and ventricle as current complications following acute myocardial infarction: Secondary | ICD-10-CM | POA: Diagnosis present

## 2016-09-30 DIAGNOSIS — Z7951 Long term (current) use of inhaled steroids: Secondary | ICD-10-CM

## 2016-09-30 DIAGNOSIS — I350 Nonrheumatic aortic (valve) stenosis: Secondary | ICD-10-CM | POA: Diagnosis present

## 2016-09-30 DIAGNOSIS — I472 Ventricular tachycardia: Secondary | ICD-10-CM | POA: Diagnosis present

## 2016-09-30 DIAGNOSIS — I25118 Atherosclerotic heart disease of native coronary artery with other forms of angina pectoris: Secondary | ICD-10-CM | POA: Diagnosis present

## 2016-09-30 DIAGNOSIS — J988 Other specified respiratory disorders: Secondary | ICD-10-CM | POA: Diagnosis not present

## 2016-09-30 DIAGNOSIS — Z96661 Presence of right artificial ankle joint: Secondary | ICD-10-CM | POA: Diagnosis present

## 2016-09-30 DIAGNOSIS — Q382 Macroglossia: Secondary | ICD-10-CM

## 2016-09-30 DIAGNOSIS — M1990 Unspecified osteoarthritis, unspecified site: Secondary | ICD-10-CM | POA: Diagnosis present

## 2016-09-30 DIAGNOSIS — Z8249 Family history of ischemic heart disease and other diseases of the circulatory system: Secondary | ICD-10-CM

## 2016-09-30 DIAGNOSIS — H3530 Unspecified macular degeneration: Secondary | ICD-10-CM | POA: Diagnosis present

## 2016-09-30 DIAGNOSIS — J45909 Unspecified asthma, uncomplicated: Secondary | ICD-10-CM | POA: Diagnosis present

## 2016-09-30 DIAGNOSIS — M5136 Other intervertebral disc degeneration, lumbar region: Secondary | ICD-10-CM | POA: Diagnosis present

## 2016-09-30 DIAGNOSIS — E785 Hyperlipidemia, unspecified: Secondary | ICD-10-CM | POA: Diagnosis present

## 2016-09-30 DIAGNOSIS — G609 Hereditary and idiopathic neuropathy, unspecified: Secondary | ICD-10-CM | POA: Diagnosis present

## 2016-09-30 DIAGNOSIS — I214 Non-ST elevation (NSTEMI) myocardial infarction: Secondary | ICD-10-CM | POA: Diagnosis present

## 2016-09-30 DIAGNOSIS — Z85828 Personal history of other malignant neoplasm of skin: Secondary | ICD-10-CM

## 2016-09-30 DIAGNOSIS — T783XXA Angioneurotic edema, initial encounter: Secondary | ICD-10-CM | POA: Diagnosis present

## 2016-09-30 DIAGNOSIS — I2102 ST elevation (STEMI) myocardial infarction involving left anterior descending coronary artery: Secondary | ICD-10-CM | POA: Diagnosis not present

## 2016-09-30 DIAGNOSIS — I10 Essential (primary) hypertension: Secondary | ICD-10-CM | POA: Diagnosis present

## 2016-09-30 DIAGNOSIS — J9601 Acute respiratory failure with hypoxia: Secondary | ICD-10-CM | POA: Diagnosis present

## 2016-09-30 DIAGNOSIS — G2581 Restless legs syndrome: Secondary | ICD-10-CM | POA: Diagnosis present

## 2016-09-30 DIAGNOSIS — B379 Candidiasis, unspecified: Secondary | ICD-10-CM | POA: Diagnosis present

## 2016-09-30 DIAGNOSIS — G4733 Obstructive sleep apnea (adult) (pediatric): Secondary | ICD-10-CM | POA: Diagnosis present

## 2016-09-30 DIAGNOSIS — Z8262 Family history of osteoporosis: Secondary | ICD-10-CM

## 2016-09-30 DIAGNOSIS — I4729 Other ventricular tachycardia: Secondary | ICD-10-CM

## 2016-09-30 DIAGNOSIS — R0602 Shortness of breath: Secondary | ICD-10-CM

## 2016-09-30 DIAGNOSIS — I480 Paroxysmal atrial fibrillation: Secondary | ICD-10-CM | POA: Diagnosis present

## 2016-09-30 DIAGNOSIS — M109 Gout, unspecified: Secondary | ICD-10-CM | POA: Diagnosis present

## 2016-09-30 DIAGNOSIS — I2109 ST elevation (STEMI) myocardial infarction involving other coronary artery of anterior wall: Secondary | ICD-10-CM | POA: Diagnosis present

## 2016-09-30 DIAGNOSIS — M810 Age-related osteoporosis without current pathological fracture: Secondary | ICD-10-CM | POA: Diagnosis present

## 2016-09-30 DIAGNOSIS — H409 Unspecified glaucoma: Secondary | ICD-10-CM | POA: Diagnosis present

## 2016-09-30 DIAGNOSIS — M4802 Spinal stenosis, cervical region: Secondary | ICD-10-CM | POA: Diagnosis present

## 2016-09-30 DIAGNOSIS — Z79899 Other long term (current) drug therapy: Secondary | ICD-10-CM

## 2016-09-30 DIAGNOSIS — T783XXD Angioneurotic edema, subsequent encounter: Secondary | ICD-10-CM | POA: Diagnosis not present

## 2016-09-30 DIAGNOSIS — Z7901 Long term (current) use of anticoagulants: Secondary | ICD-10-CM

## 2016-09-30 DIAGNOSIS — E860 Dehydration: Secondary | ICD-10-CM | POA: Diagnosis present

## 2016-09-30 DIAGNOSIS — J9690 Respiratory failure, unspecified, unspecified whether with hypoxia or hypercapnia: Secondary | ICD-10-CM

## 2016-09-30 DIAGNOSIS — I2510 Atherosclerotic heart disease of native coronary artery without angina pectoris: Secondary | ICD-10-CM | POA: Diagnosis not present

## 2016-09-30 LAB — CBC WITH DIFFERENTIAL/PLATELET
Basophils Absolute: 0.1 10*3/uL (ref 0–0.1)
Basophils Relative: 1 %
Eosinophils Absolute: 0.2 10*3/uL (ref 0–0.7)
Eosinophils Relative: 2 %
HCT: 56.1 % — ABNORMAL HIGH (ref 40.0–52.0)
Hemoglobin: 18.6 g/dL — ABNORMAL HIGH (ref 13.0–18.0)
Lymphocytes Relative: 31 %
Lymphs Abs: 3.1 10*3/uL (ref 1.0–3.6)
MCH: 31.4 pg (ref 26.0–34.0)
MCHC: 33.1 g/dL (ref 32.0–36.0)
MCV: 94.8 fL (ref 80.0–100.0)
Monocytes Absolute: 0.9 10*3/uL (ref 0.2–1.0)
Monocytes Relative: 9 %
Neutro Abs: 5.7 10*3/uL (ref 1.4–6.5)
Neutrophils Relative %: 57 %
Platelets: 193 10*3/uL (ref 150–440)
RBC: 5.92 MIL/uL — ABNORMAL HIGH (ref 4.40–5.90)
RDW: 15.3 % — ABNORMAL HIGH (ref 11.5–14.5)
WBC: 10.1 10*3/uL (ref 3.8–10.6)

## 2016-09-30 MED ORDER — AMIODARONE HCL IN DEXTROSE 360-4.14 MG/200ML-% IV SOLN
INTRAVENOUS | Status: AC
  Administered 2016-09-30: 60 mg/h via INTRAVENOUS
  Filled 2016-09-30: qty 200

## 2016-09-30 MED ORDER — PROPOFOL 1000 MG/100ML IV EMUL
5.0000 ug/kg/min | Freq: Once | INTRAVENOUS | Status: DC
  Administered 2016-09-30: 50 mg/h via INTRAVENOUS
  Administered 2016-09-30: 5 ug/kg/min via INTRAVENOUS

## 2016-09-30 MED ORDER — PROPOFOL 1000 MG/100ML IV EMUL
INTRAVENOUS | Status: AC
  Administered 2016-09-30: 50 mg/h via INTRAVENOUS
  Filled 2016-09-30: qty 100

## 2016-09-30 MED ORDER — EPINEPHRINE PF 1 MG/ML IJ SOLN
0.5000 ug/min | INTRAVENOUS | Status: DC
  Administered 2016-09-30: 0.5 ug/min via INTRAVENOUS
  Filled 2016-09-30: qty 4

## 2016-09-30 NOTE — H&P (Signed)
History and Physical   [facility] - [place] @ [facility] Admission History and Physical [employer], D.O.    Patient Name: Donald Riley MR#: [id number] Date of Birth: 1933-05-21 Date of Admission: 09/30/2016  Referring MD/NP/PA: Dr. Beather Arbour Primary Care Physician: Hortencia Pilar, MD Patient coming from: Home  Chief Complaint:  Chief Complaint  Patient presents with  . Allergic Reaction  Please note the entire history is obtained from the patient's emergency department chart, emergency department provider and EMS records Patient's personal history is limited by sedation, intubation   HPI: Donald Riley is a 83 y.o. male with a known history of Atrial fibrillation, hypertension, asthma, GERD, osteoporosis, osteoarthritis and sleep apnea presents to the emergency department for evaluation of allergic reaction.  Patient was in a usual state of health until this evening prior to bed when he developed tongue swelling.  He had been eating food at a barbecue but no new foods or potential allergens. The only other significant finding per family was the use of a TENS unit on his neck for the first time tonight.  EMS/ED Course: Patient received intramuscular epinephrine 3, 125 mg IV Solu-Medrol, 60 mg IV Benadryl, 50 g IV Zantac, for DuoNeb's and 2 albuterol treatments. He was given succinylcholine and ketamine and was intubated by anesthesia in the emergency department. He received an additional 10 mg of IV Decadron and an epinephrine drip. Shortly after he developed a nonsustained run of V. tach for which the epi drip was discontinued and he was started on magnesium and amiodarone drip. Medical admission was requested for ongoing management of anaphylaxis, angioedema of unclear etiology  Review of Systems:  Unable to obtain secondary to sedation, intubation   Past Medical History:  Diagnosis Date  . Arthritis   . Asthma   . Atrial fibrillation ([place])   . Cancer ([place])   . GERD  (gastroesophageal reflux disease)   . Glaucoma   . Gout   . Heart murmur   . Hypertension   . Neuropathy   . Osteoporosis   . Sleep apnea   Active Problems Reconcile with Patient's Chart  Problem Noted Date  Pseudogout 09/21/2016  History of GI bleed 01/28/2016  Aortic stenosis, moderate 01/15/2016  Coronary artery disease of native artery of native heart with stable angina pectoris (CMS-HCC) 08/15/2015  Overview:   Cardiac catheterization done and April 2017 revealed Prox Cx lesion, 10% stenosed.  Ost LAD lesion, 40% stenosed.  1st Diag-1 lesion, 70% stenosed.  1st Diag-2 lesion, 50% stenosed.  Mid LAD lesion, 100% stenosed.  Mid RCA lesion, 10% stenosed. No significant aortic stenosis Preserved LV function   Paroxysmal atrial fibrillation (CMS-HCC) 12/19/2014  Lumbar radiculitis 10/19/2014  Lower extremity weakness 12/13/2013  Degeneration of intervertebral disc 10/31/2013  Esophageal reflux 10/31/2013  Malignant neoplasm of prostate (CMS-HCC) 10/31/2013  Overview:   Overview:  Laparoscopic prostatectomy 2002   Hematuria 04/05/2013  OSA on CPAP 10/18/2012  Asthma 02/18/2012  Ankle arthritis 12/23/2011  Hyperlipidemia, unspecified 03/31/2011  GOUT 11/27/2010  Essential hypertension, benign 11/27/2010  Osteopenia 11/27/2010  Restless leg 11/27/2010  GERD (gastroesophageal reflux disease) 11/27/2010  Hiatal hernia 11/27/2010  Peripheral neuropathy 11/27/2010  HEARING LOSS 11/27/2010  History of skin cancer 11/27/2010  Macular degeneration 11/27/2010  Cervical stenosis of spine 11/27/2010  DDD (degenerative disc disease), lumbar    Medical History   Medical History Date Comments  Atrial fibrillation , unspecified (CMS-HCC) 2000   Gout, unspecified    Essential hypertension, benign    Disorder of  bone and cartilage, unspecified    Asthma    Restless legs syndrome (RLS)    Esophageal reflux    Other specified idiopathic peripheral  neuropathy    Unspecified hearing loss    Personal history of diseases of skin and subcutaneous tissue    Macular degeneration (senile) of retina, unspecified    Spinal stenosis in cervical region    Degeneration of intervertebral disc, site unspecified    OSA on CPAP 10/18/2012   Allergic state 2000 induced hypertension, not a true allergy  Cancer (CMS-HCC) 2002 prostate  Cataract cortical, senile 2013   Glaucoma (increased eye pressure) 2012   Heart murmur, unspecified 1980   Hyperlipidemia, unspecified 2013   Heart murmur, systolic, unspecified 88/41/6606   Heart disease    Prostate cancer (CMS-HCC)    Acute blood loss anemia       Past Surgical History:  Procedure Laterality Date  . ARTHROPLASTY Right    ankle  . BUNIONECTOMY    . CARDIAC CATHETERIZATION N/A 07/02/2015   Procedure: Right/Left Heart Cath and Coronary Angiography;  Surgeon: Teodoro Spray, MD;  Location: [facility];  Service: Cardiovascular;  Laterality: N/A;  . JOINT REPLACEMENT    . ostatectomy    Surgical History   Surgery Date Laterality Comments  BUNIONECTOMY 1992 Left   PROSTATECTOMY 2002    HERNIA REPAIR 2003  RIGHT INGUINAL  ARTHROPLASTY TOTAL ANKLE 02/18/2012 Right Procedure: ARTHROPLASTY TOTAL ANKLE; Surgeon: Amada Kingfisher, MD; Location: [facility]; Service: Orthopedics; Laterality: Right;  INTRAOPERATIVE FLOUROSCOPY 02/18/2012  Procedure: FLUOROSCOPE EXAM >1 HR EXTENSIVE; Surgeon: Amada Kingfisher, MD; Location: [place]; Service: Orthopedics;;  RECESSION GASTROCNEMIUS BAKER LENGTHENING 02/18/2012  Procedure: RECESSION GASTROCNEMIUS BAKER LENGTHENING; Surgeon: Amada Kingfisher, MD; Location: [place]; Service: Orthopedics;;  skin cancer     CATARACT EXTRACTION 2013    JOINT REPLACEMENT 2013    VASECTOMY 1980    COLONOSCOPY 09/08/2010  07/23/2000; Int Hemorrhoids: CBF 06/2015; Recall Ltr mailed 06/03/2015 (dw)  EGD 09/08/2010  No  repeat per RTE  CARDIAC CATHETERIZATION         reports that he has never smoked. He has never used smokeless tobacco. He reports that he does not drink alcohol or use drugs.  No Known Allergies  Family History   Medical History Relation Name Comments  Alcohol abuse Brother    Heart disease Brother    High blood pressure (Hypertension) Brother    Osteoporosis (Thinning of bones) Brother    Skin cancer Brother    Osteoporosis (Thinning of bones) Brother    Skin cancer Brother    No Known Problems Daughter    Heart disease Father    Stroke Father    Coronary Artery Disease (Blocked arteries around heart) Maternal Aunt    Stroke Maternal Aunt    Stroke Maternal Aunt    Coronary Artery Disease (Blocked arteries around heart) Maternal Uncle    Stroke Maternal Uncle    Stroke Maternal Uncle    Gout Mother    Heart disease Mother    High blood pressure (Hypertension) Mother    Osteoporosis (Thinning of bones) Mother    Stroke Mother    Stroke Paternal Grandfather    Alcohol abuse Paternal Uncle    Diabetes Sister    Glaucoma Sister    High blood pressure (Hypertension) Sister    High blood pressure (Hypertension) Sister       Prior to Admission medications   Medication Sig Start Date  End Date Taking? Authorizing Provider  albuterol (VENTOLIN HFA) 108 (90 BASE) MCG/ACT inhaler Inhale into the lungs every 4 (four) hours as needed.  01/30/14   [provider]  alendronate (FOSAMAX) 70 MG tablet Take 70 mg by mouth. Reported on 07/02/2015    [provider]  apixaban (ELIQUIS) 5 MG TABS tablet Take by mouth. 07/18/14   [provider]  ARTIFICIAL TEARS 0.1-0.3 % SOLN Reported on 07/02/2015 05/19/12   [provider]  beta carotene w/minerals (OCUVITE) tablet Take 1 tablet by mouth.    [provider]  brimonidine-timolol (COMBIGAN) 0.2-0.5 % ophthalmic solution 1 drop 2 (two) times daily.     [provider]  Calcium Carb-Cholecalciferol (CALCIUM PLUS VITAMIN D3) 600-500 MG-UNIT CAPS Take by mouth 2 (two) times daily.  05/19/12   [provider]  colchicine (COLCRYS) 0.6 MG tablet Take 0.6 mg by mouth. 01/31/13   [provider]  diltiazem (CARDIZEM CD) 240 MG 24 hr capsule Reported on 07/02/2015 12/19/14   [provider]  flecainide (TAMBOCOR) 50 MG tablet Take 100 mg by mouth. Reported on 07/02/2015 05/19/12   [provider]  glucosamine-chondroitin (GLUCOSAMINE-CHONDROITIN DS) 500-400 MG tablet Take by mouth.    [provider]  Hydrocodone-Acetaminophen 5-300 MG TABS Take by mouth.    [provider]  ketotifen (ZADITOR) 0.025 % ophthalmic solution  05/19/12   [provider]  latanoprost (XALATAN) 0.005 % ophthalmic solution Reported on 07/02/2015 01/30/13   [provider]  losartan (COZAAR) 100 MG tablet Take 100 mg by mouth. Reported on 07/02/2015 05/28/11   [provider]  Lutein 20 MG CAPS Take 20 mg by mouth. 05/19/12   [provider]  meloxicam (MOBIC) 15 MG tablet Take 15 mg by mouth. Reported on 07/02/2015 05/28/11   [provider]  metoprolol succinate (TOPROL-XL) 25 MG 24 hr tablet Take 25 mg by mouth. Reported on 07/02/2015 05/19/12   [provider]  mometasone [facility]) 220 MCG/INH inhaler Inhale 2 puffs into the lungs daily.    [provider]  mometasone-formoterol (DULERA) 200-5 MCG/ACT AERO Inhale into the lungs. Reported on 07/02/2015 01/30/14   [provider]  pantoprazole (PROTONIX) 40 MG tablet Take 40 mg by mouth. Reported on 07/02/2015 05/19/12   [provider]  pramipexole (MIRAPEX) 0.25 MG tablet Take 0.25 mg by mouth.     [provider]  predniSONE (DELTASONE) 20 MG tablet For asthma flare take 60 mg/day for 5 days, 40 mg/d x 5 days, 120 mg/d x 5 days and 10 mg/d x 5 days. 08/01/13   [provider]  ranitidine  (ZANTAC) 150 MG tablet  12/19/14   [provider]  rosuvastatin (CRESTOR) 40 MG tablet Take 140 mg by mouth. Reported on 07/02/2015    [provider]  tiotropium (SPIRIVA HANDIHALER) 18 MCG inhalation capsule Place into inhaler and inhale. Reported son anaphylaxis 07/02/2015 01/30/14   [provider]  traMADol (ULTRAM) 50 MG tablet 50 mg. 03/08/13   [provider]    Physical Exam: Vitals:   09/30/16 2319  BP: (!) 179/124  Pulse: (!) 104  Resp: 19  SpO2: 100%    GENERAL: 83 y.o.-year-old male patient, well-developed, well-nourished lying in the bed in no acute distress.  Sedated, intubated HEENT: Head atraumatic, normocephalic. Pupils equal, round, reactive to light and accommodation. No scleral icterus. Extraocular muscles intact. Nares are patent. Oropharynx is clear. Mucus membranes moist. Severe tongue swelling.  NECK: Supple. No  JVD, no bruit heard. No thyroid enlargement, no tenderness, no cervical lymphadenopathy. CHEST: Normal breath sounds bilaterally. No wheezing, rales, rhonchi or crackles. No use of accessory muscles of respiration.   CARDIOVASCULAR: S1, S2 normal. No murmurs, rubs, or gallops. Cap refill <2 seconds. Pulses intact distally.  ABDOMEN: Soft, nondistended, nontender. No rebound, guarding, rigidity. Normoactive bowel sounds present in all four quadrants. No organomegaly or mass. EXTREMITIES: No pedal edema, cyanosis, or clubbing. No calf tenderness or Homan's sign.  NEUROLOGIC: Unable to comply with exam due to sedation.  SKIN: Warm, dry, and intact without obvious rash, lesion, or ulcer.    Labs on Admission:  CBC:  Recent Labs Lab 09/30/16 2320  WBC 10.1  NEUTROABS 5.7  HGB 18.6*  HCT 56.1*  MCV 94.8  PLT 892   Basic Metabolic Panel: No results for input(s): NA, K, CL, CO2, GLUCOSE, BUN, CREATININE, CALCIUM, MG, PHOS in the last 168 hours. GFR: CrCl cannot be calculated (Patient's most recent lab result is older  than the maximum 21 days allowed.). Liver Function Tests: No results for input(s): AST, ALT, ALKPHOS, BILITOT, PROT, ALBUMIN in the last 168 hours. No results for input(s): LIPASE, AMYLASE in the last 168 hours. No results for input(s): AMMONIA in the last 168 hours. Coagulation Profile: No results for input(s): INR, PROTIME in the last 168 hours. Cardiac Enzymes: No results for input(s): CKTOTAL, CKMB, CKMBINDEX, TROPONINI in the last 168 hours. BNP (last 3 results) No results for input(s): PROBNP in the last 8760 hours. HbA1C: No results for input(s): HGBA1C in the last 72 hours. CBG: No results for input(s): GLUCAP in the last 168 hours. Lipid Profile: No results for input(s): CHOL, HDL, LDLCALC, TRIG, CHOLHDL, LDLDIRECT in the last 72 hours. Thyroid Function Tests: No results for input(s): TSH, T4TOTAL, FREET4, T3FREE, THYROIDAB in the last 72 hours. Anemia Panel: No results for input(s): VITAMINB12, FOLATE, FERRITIN, TIBC, IRON, RETICCTPCT in the last 72 hours. Urine analysis: No results found for: COLORURINE, APPEARANCEUR, LABSPEC, PHURINE, GLUCOSEU, HGBUR, BILIRUBINUR, KETONESUR, PROTEINUR, UROBILINOGEN, NITRITE, LEUKOCYTESUR Sepsis Labs: @LABRCNTIP (procalcitonin:4,lacticidven:4) )No results found for this or any previous visit (from the past 240 hour(s)).   Radiological Exams on Admission: No results found.  EKG: Normal sinus rhythm at 88 bpm with leftward axis, IVCD and nonspecific ST-T wave changes.   Assessment/Plan  This is a 83 y.o. male with a history of Atrial fibrillation, hypertension, asthma, GERD, osteoporosis, osteoarthritis and sleep apnea  now being admitted with:  #. Anaphylaxis -Admit to ICU -Vent management and sedation per CCM -Continue Decadron, Pepcid, Benadryl -CCM consult did  #. Nonsustained V. Tach -Monitor on telemetry -Continue amiodarone drip -Trend troponins, check lipids and TSH -Cardiology consultation has been requested -Check  echo  #. Concern for pneumonia -We will start IV Zosyn and Vanco -Follow-up blood, sputum cultures  #. History of atrial fibrillation -All by mouth meds on hold  #. History of GERD - Continue IV Pepcid  Admission status: Inpatient, ICU IV Fluids: Normal saline Diet/Nutrition: Nothing by mouth Consults called: Critical care, cardiology  DVT Px: Lovenox, SCDs and early ambulation. Code Status: Full Code  Disposition Plan: To be determined  All the records are reviewed and case discussed with ED provider. Management plans discussed with the patient and/or family who express understanding and agree with plan of care.  Zekiah Coen D.O. on 09/30/2016 at 11:53 PM Between 7am to 6pm - Pager - [phone] After 6pm go to [email or web address] - [facility] Office  [phone] CC: Primary care physician; Hortencia Pilar, MD   09/30/2016, 11:53 PM

## 2016-09-30 NOTE — ED Triage Notes (Addendum)
Pt comes via [facility] with c/o respiratory distress. Pt alert at time of arrival with labored breathing and noted tongue swelling and facial swelling. Pt on nonrebreather at 94%. Per EMS pt was given 3 Epi IM, 125 Solu, 50 bendarly, 50 zantac, 4 duonebs, and 2 albuterol. MD and RT at bedside upon pt's arrival.

## 2016-09-30 NOTE — ED Provider Notes (Signed)
[facility] Emergency Department Provider Note   ____________________________________________   First MD Initiated Contact with Patient 09/30/16 2341     (approximate)  I have reviewed the triage vital signs and the nursing notes.   HISTORY  Chief Complaint Respiratory Distress  Level 5 caveat: Limited by respiratory distress  HPI Donald Riley is a 83 y.o. male brought to the ED by EMS with a chief complaint of anaphylaxis/angioedema. Per spouse, patient had no change in his routine or food intake today. Ate hotdogs and hamburgers with the family at 3 PM followed by an ice cream bar. He uses a TENS units nightly. States tonight was the first time he used it on his neck. Prior to going to bed, patient felt like his tongue was tingling, looked in the mirror and told his wife to call 911. En route to the ED, patient was given 3 IM epi, 125 mg IV Solu-Medrol, 50 mg IV Benadryl, 50 mg IV Zantac, for DuoNeb and 2 albuterol nebulizer treatments.Arrives to the treatment room on a nonrebreather at 94%, severe tongue angioedema, drooling and muffled voice.   Past Medical History:  Diagnosis Date  . Arthritis   . Asthma   . Atrial fibrillation ([place])   . Cancer ([place])   . GERD (gastroesophageal reflux disease)   . Glaucoma   . Gout   . Heart murmur   . Hypertension   . Neuropathy   . Osteoporosis   . Sleep apnea     Patient Active Problem List   Diagnosis Date Noted  . Anaphylaxis 09/30/2016    Past Surgical History:  Procedure Laterality Date  . ARTHROPLASTY Right    ankle  . BUNIONECTOMY    . CARDIAC CATHETERIZATION N/A 07/02/2015   Procedure: Right/Left Heart Cath and Coronary Angiography;  Surgeon: Teodoro Spray, MD;  Location: [facility];  Service: Cardiovascular;  Laterality: N/A;  . JOINT REPLACEMENT    . ostatectomy      Prior to Admission medications   Medication Sig Start Date End Date Taking? Authorizing Provider  albuterol  (VENTOLIN HFA) 108 (90 BASE) MCG/ACT inhaler Inhale into the lungs every 4 (four) hours as needed.  01/30/14   [provider]  alendronate (FOSAMAX) 70 MG tablet Take 70 mg by mouth. Reported on 07/02/2015    [provider]  apixaban (ELIQUIS) 5 MG TABS tablet Take by mouth. 07/18/14   [provider]  ARTIFICIAL TEARS 0.1-0.3 % SOLN Reported on 07/02/2015 05/19/12   [provider]  beta carotene w/minerals (OCUVITE) tablet Take 1 tablet by mouth.    [provider]  brimonidine-timolol (COMBIGAN) 0.2-0.5 % ophthalmic solution 1 drop 2 (two) times daily.    [provider]  Calcium Carb-Cholecalciferol (CALCIUM PLUS VITAMIN D3) 600-500 MG-UNIT CAPS Take by mouth 2 (two) times daily.  05/19/12   [provider]  colchicine (COLCRYS) 0.6 MG tablet Take 0.6 mg by mouth. 01/31/13   [provider]  diltiazem (CARDIZEM CD) 240 MG 24 hr capsule Reported on 07/02/2015 12/19/14   [provider]  flecainide (TAMBOCOR) 50 MG tablet Take 100 mg by mouth. Reported on 07/02/2015 05/19/12   [provider]  glucosamine-chondroitin (GLUCOSAMINE-CHONDROITIN DS) 500-400 MG tablet Take by mouth.    [provider]  Hydrocodone-Acetaminophen 5-300 MG TABS Take by mouth.    [provider]  ketotifen (ZADITOR) 0.025 % ophthalmic solution  05/19/12   [provider]  latanoprost (XALATAN) 0.005 %  ophthalmic solution Reported on 07/02/2015 01/30/13   [provider]  losartan (COZAAR) 100 MG tablet Take 100 mg by mouth. Reported on 07/02/2015 05/28/11   [provider]  Lutein 20 MG CAPS Take 20 mg by mouth. 05/19/12   [provider]  meloxicam (MOBIC) 15 MG tablet Take 15 mg by mouth. Reported on 07/02/2015 05/28/11   [provider]  metoprolol succinate (TOPROL-XL) 25 MG 24 hr tablet Take 25 mg by mouth. Reported on 07/02/2015 05/19/12   [provider]  mometasone [facility]) 220  MCG/INH inhaler Inhale 2 puffs into the lungs daily.    [provider]  mometasone-formoterol (DULERA) 200-5 MCG/ACT AERO Inhale into the lungs. Reported on 07/02/2015 01/30/14   [provider]  pantoprazole (PROTONIX) 40 MG tablet Take 40 mg by mouth. Reported on 07/02/2015 05/19/12   [provider]  pramipexole (MIRAPEX) 0.25 MG tablet Take 0.25 mg by mouth.     [provider]  predniSONE (DELTASONE) 20 MG tablet For asthma flare take 60 mg/day for 5 days, 40 mg/d x 5 days, 120 mg/d x 5 days and 10 mg/d x 5 days. 08/01/13   [provider]  ranitidine (ZANTAC) 150 MG tablet  12/19/14   [provider]  rosuvastatin (CRESTOR) 40 MG tablet Take 140 mg by mouth. Reported on 07/02/2015    [provider]  tiotropium (SPIRIVA HANDIHALER) 18 MCG inhalation capsule Place into inhaler and inhale. Reported on 07/02/2015 01/30/14   [provider]  traMADol (ULTRAM) 50 MG tablet 50 mg. 03/08/13   [provider]    Allergies Patient has no known allergies.  No family history on file.  Social History Social History  Substance Use Topics  . Smoking status: Never Smoker  . Smokeless tobacco: Never Used  . Alcohol use No    Review of Systems  Constitutional: No fever/chills. Eyes: No visual changes. ENT: Positive for tongue swelling. No sore throat. Cardiovascular: Denies chest pain. Respiratory: Positive for shortness of breath. Gastrointestinal: No abdominal pain.  No nausea, no vomiting.  No diarrhea.  No constipation. Genitourinary: Negative for dysuria. Musculoskeletal: Negative for back pain. Skin: Negative for rash. Neurological: Negative for headaches, focal weakness or numbness.   ____________________________________________   PHYSICAL EXAM:  VITAL SIGNS: ED Triage Vitals [09/30/16 2319]  Enc Vitals Group     BP (!) 179/124     Pulse Rate (!) 104     Resp 19     Temp      Temp src      SpO2 100 %      Weight      Height      Head Circumference      Peak Flow      Pain Score      Pain Loc      Pain Edu?      Excl. in [place]?     Constitutional: Alert and oriented. Ill appearing and in severe acute distress. Eyes: Conjunctivae are normal. PERRL. EOMI. Head: Atraumatic. Nose: No congestion/rhinnorhea. Mouth/Throat: Severe tongue angioedema. Drooling, unable to tolerate secretions. Muffled voice.  Neck: No stridor.  Brawny edema. Cardiovascular: Normal rate, regular rhythm. Grossly normal heart sounds.  Good peripheral circulation. Respiratory: Increased respiratory effort.  No retractions. Lungs diminished with gurgling. Gastrointestinal: Soft and nontender. No distention. No abdominal bruits. No CVA tenderness. Musculoskeletal: No lower extremity tenderness nor edema.  No joint effusions. Neurologic:  Normal speech and language. No gross focal neurologic deficits are  appreciated.  Skin:  Skin is warm, dry and intact. No rash noted. No urticaria. Psychiatric: Mood and affect are normal. Speech and behavior are normal.  ____________________________________________   LABS (all labs ordered are listed, but only abnormal results are displayed)  Labs Reviewed  CBC WITH DIFFERENTIAL/PLATELET - Abnormal; Notable for the following:       Result Value   RBC 5.92 (*)    Hemoglobin 18.6 (*)    HCT 56.1 (*)    RDW 15.3 (*)    All other components within normal limits  COMPREHENSIVE METABOLIC PANEL  TROPONIN I  BLOOD GAS, ARTERIAL  MAGNESIUM   ____________________________________________  EKG  ED ECG REPORT I, Trinitey Roache J, the attending physician, personally viewed and interpreted this ECG.   Date: 10/01/2016  EKG Time: 2349  Rate: 88  Rhythm: normal EKG, normal sinus rhythm  Axis: LAD  Intervals:nonspecific intraventricular conduction delay  ST&T Change: Nonspecific  ____________________________________________  RADIOLOGY  Dg Chest Portable 1 View  Result Date:  09/30/2016 CLINICAL DATA:  Endotracheal tube placement.  Initial encounter. EXAM: PORTABLE CHEST 1 VIEW COMPARISON:  Chest radiograph performed 07/19/2010 FINDINGS: The patient's endotracheal tube is seen ending 4 cm above the carina. Lung expansion is mildly decreased. Increased interstitial markings are noted. Right apical and perihilar opacity could reflect pneumonia. Mild bibasilar scarring is noted. No pleural effusion or pneumothorax is seen. The cardiomediastinal silhouette is borderline normal in size. No acute osseous abnormalities are seen. External pacing pads are noted. IMPRESSION: 1. Endotracheal tube seen ending 4 cm above the carina. 2. Increased interstitial markings. Right apical and perihilar opacity could reflect pneumonia. Followup PA and lateral chest X-ray is recommended in 3-4 weeks following trial of antibiotic therapy to ensure resolution and exclude underlying malignancy. Electronically Signed   By: Garald Balding M.D.   On: 09/30/2016 23:50    ____________________________________________   PROCEDURES  Procedure(s) performed:   Intubation performed by anesthesia secondary to difficult airway  Procedures  Critical Care performed: Yes, see critical care note(s)  CRITICAL CARE Performed by: Paulette Blanch   Total critical care time: 60 minutes  Critical care time was exclusive of separately billable procedures and treating other patients.  Critical care was necessary to treat or prevent imminent or life-threatening deterioration.  Critical care was time spent personally by me on the following activities: development of treatment plan with patient and/or surrogate as well as nursing, discussions with consultants, evaluation of patient's response to treatment, examination of patient, obtaining history from patient or surrogate, ordering and performing treatments and interventions, ordering and review of laboratory studies, ordering and review of radiographic studies, pulse  oximetry and re-evaluation of patient's condition. ____________________________________________   INITIAL IMPRESSION / ASSESSMENT AND PLAN / ED COURSE  Pertinent labs & imaging results that were available during my care of the patient were reviewed by me and considered in my medical decision making (see chart for details).  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 Anesthesia and ENT called immediately. Patient was given an additional 10 mg IV Decadron. Epi-drip was initiated. Patient successfully intubated per anesthesia under succinylcholine and ketamine. Epi-drip was subsequently held secondary to patient with nonsustained ventricular tachycardia. Magnesium, amiodarone bolus and drip administered. Discussed with hospitalist to evaluate patient in the emergency department for admission. Spouse and daughter updated in the family room.      ____________________________________________   FINAL CLINICAL IMPRESSION(S) / ED DIAGNOSES  Final diagnoses:  Anaphylaxis, initial encounter  Angioedema,  initial encounter  Nonsustained ventricular tachycardia (HCC)      NEW MEDICATIONS STARTED DURING THIS VISIT:  New Prescriptions   No medications on file     Note:  This document was prepared using Dragon voice recognition software and may include unintentional dictation errors.    Paulette Blanch, MD 10/01/16 [phone]

## 2016-10-01 ENCOUNTER — Encounter: Payer: Self-pay | Admitting: Anesthesiology

## 2016-10-01 ENCOUNTER — Inpatient Hospital Stay
Admit: 2016-10-01 | Discharge: 2016-10-01 | Disposition: A | Discharge status: Home / Self Care | Payer: Medicare Other | Attending: Family Medicine | Admitting: Family Medicine

## 2016-10-01 DIAGNOSIS — Q382 Macroglossia: Secondary | ICD-10-CM

## 2016-10-01 DIAGNOSIS — J988 Other specified respiratory disorders: Secondary | ICD-10-CM

## 2016-10-01 DIAGNOSIS — I4729 Other ventricular tachycardia: Secondary | ICD-10-CM

## 2016-10-01 DIAGNOSIS — I472 Ventricular tachycardia: Secondary | ICD-10-CM

## 2016-10-01 DIAGNOSIS — T783XXA Angioneurotic edema, initial encounter: Secondary | ICD-10-CM

## 2016-10-01 DIAGNOSIS — J9600 Acute respiratory failure, unspecified whether with hypoxia or hypercapnia: Secondary | ICD-10-CM

## 2016-10-01 DIAGNOSIS — T783XXD Angioneurotic edema, subsequent encounter: Secondary | ICD-10-CM

## 2016-10-01 DIAGNOSIS — G4733 Obstructive sleep apnea (adult) (pediatric): Secondary | ICD-10-CM

## 2016-10-01 DIAGNOSIS — I214 Non-ST elevation (NSTEMI) myocardial infarction: Secondary | ICD-10-CM

## 2016-10-01 LAB — COMPREHENSIVE METABOLIC PANEL
ALT: 17 U/L (ref 17–63)
ALT: 32 U/L (ref 17–63)
AST: 29 U/L (ref 15–41)
AST: 102 U/L — ABNORMAL HIGH (ref 15–41)
Albumin: 4.1 g/dL (ref 3.5–5.0)
Albumin: 3.4 g/dL — ABNORMAL LOW (ref 3.5–5.0)
Alkaline Phosphatase: 96 U/L (ref 38–126)
Alkaline Phosphatase: 80 U/L (ref 38–126)
Anion gap: 10 (ref 5–15)
Anion gap: 5 (ref 5–15)
BUN: 28 mg/dL — ABNORMAL HIGH (ref 6–20)
BUN: 27 mg/dL — ABNORMAL HIGH (ref 6–20)
CO2: 25 mmol/L (ref 22–32)
CO2: 25 mmol/L (ref 22–32)
Calcium: 9 mg/dL (ref 8.9–10.3)
Calcium: 8.4 mg/dL — ABNORMAL LOW (ref 8.9–10.3)
Chloride: 103 mmol/L (ref 101–111)
Chloride: 106 mmol/L (ref 101–111)
Creatinine, Ser: 1.07 mg/dL (ref 0.61–1.24)
Creatinine, Ser: 1.1 mg/dL (ref 0.61–1.24)
GFR calc Af Amer: >60 mL/min (ref >60)
GFR calc Af Amer: >60 mL/min (ref >60)
GFR calc non Af Amer: >60 mL/min (ref >60)
GFR calc non Af Amer: >60 mL/min (ref >60)
Glucose, Bld: 181 mg/dL — ABNORMAL HIGH (ref 65–99)
Glucose, Bld: 214 mg/dL — ABNORMAL HIGH (ref 65–99)
Potassium: 4.3 mmol/L (ref 3.5–5.1)
Potassium: 4.8 mmol/L (ref 3.5–5.1)
Sodium: 138 mmol/L (ref 135–145)
Sodium: 136 mmol/L (ref 135–145)
Total Bilirubin: 0.9 mg/dL (ref 0.3–1.2)
Total Bilirubin: 0.7 mg/dL (ref 0.3–1.2)
Total Protein: 7 g/dL (ref 6.5–8.1)
Total Protein: 6 g/dL — ABNORMAL LOW (ref 6.5–8.1)

## 2016-10-01 LAB — APTT: aPTT: 31 seconds (ref 24–36)

## 2016-10-01 LAB — BLOOD GAS, ARTERIAL
Acid-base deficit: 4.6 mmol/L — ABNORMAL HIGH (ref 0.0–2.0)
Bicarbonate: 21.7 mmol/L (ref 20.0–28.0)
FIO2: 1
MECHVT: 500 mL
O2 Saturation: 99.5 %
PEEP: 5 cmH2O
Patient temperature: 37
RATE: 20 resp/min
pCO2 arterial: 43 mmHg (ref 32.0–48.0)
pH, Arterial: 7.31 — ABNORMAL LOW (ref 7.350–7.450)
pO2, Arterial: 183 mmHg — ABNORMAL HIGH (ref 83.0–108.0)

## 2016-10-01 LAB — TSH: TSH: 0.303 u[IU]/mL — ABNORMAL LOW (ref 0.350–4.500)

## 2016-10-01 LAB — GLUCOSE, CAPILLARY
Glucose-Capillary: 147 mg/dL — ABNORMAL HIGH (ref 65–99)
Glucose-Capillary: 162 mg/dL — ABNORMAL HIGH (ref 65–99)
Glucose-Capillary: 164 mg/dL — ABNORMAL HIGH (ref 65–99)
Glucose-Capillary: 190 mg/dL — ABNORMAL HIGH (ref 65–99)
Glucose-Capillary: 200 mg/dL — ABNORMAL HIGH (ref 65–99)
Glucose-Capillary: 247 mg/dL — ABNORMAL HIGH (ref 65–99)

## 2016-10-01 LAB — MAGNESIUM: Magnesium: 2.1 mg/dL (ref 1.7–2.4)

## 2016-10-01 LAB — CBC
HCT: 49.3 % (ref 40.0–52.0)
Hemoglobin: 16.4 g/dL (ref 13.0–18.0)
MCH: 31.5 pg (ref 26.0–34.0)
MCHC: 33.3 g/dL (ref 32.0–36.0)
MCV: 94.6 fL (ref 80.0–100.0)
Platelets: 147 10*3/uL — ABNORMAL LOW (ref 150–440)
RBC: 5.21 MIL/uL (ref 4.40–5.90)
RDW: 15.2 % — ABNORMAL HIGH (ref 11.5–14.5)
WBC: 13.6 10*3/uL — ABNORMAL HIGH (ref 3.8–10.6)

## 2016-10-01 LAB — MRSA PCR SCREENING: MRSA by PCR: NEGATIVE

## 2016-10-01 LAB — PROTIME-INR
INR: 1.06
Prothrombin Time: 13.8 seconds (ref 11.4–15.2)

## 2016-10-01 LAB — LIPID PANEL
Cholesterol: 201 mg/dL — ABNORMAL HIGH (ref 0–200)
HDL: 82 mg/dL (ref >40)
LDL Cholesterol: 105 mg/dL — ABNORMAL HIGH (ref 0–99)
Total CHOL/HDL Ratio: 2.5 RATIO
Triglycerides: 68 mg/dL (ref <150)
VLDL: 14 mg/dL (ref 0–40)

## 2016-10-01 LAB — ECHOCARDIOGRAM COMPLETE
Height: 70 in
Weight: 3089.97 oz

## 2016-10-01 LAB — TROPONIN I
Troponin I: <0.03 ng/mL (ref <0.03)
Troponin I: 8.15 ng/mL (ref <0.03)
Troponin I: 18.13 ng/mL (ref <0.03)
Troponin I: 32.29 ng/mL (ref <0.03)

## 2016-10-01 LAB — TRIGLYCERIDES: Triglycerides: 87 mg/dL (ref <150)

## 2016-10-01 MED ORDER — PRAMIPEXOLE DIHYDROCHLORIDE 0.25 MG PO TABS: 0.2500 mg | ORAL_TABLET | Freq: Two times a day (BID) | ORAL | Status: DC

## 2016-10-01 MED ORDER — KETAMINE HCL 10 MG/ML IJ SOLN
100.0000 mg | Freq: Once | INTRAMUSCULAR | Status: AC
  Administered 2016-09-30: 100 mg via INTRAVENOUS

## 2016-10-01 MED ORDER — FUROSEMIDE 10 MG/ML IJ SOLN
40.0000 mg | Freq: Once | INTRAMUSCULAR | Status: AC
  Administered 2016-10-01: 40 mg via INTRAVENOUS
  Filled 2016-10-01: qty 4

## 2016-10-01 MED ORDER — SUCCINYLCHOLINE CHLORIDE 20 MG/ML IJ SOLN
180.0000 mg | Freq: Once | INTRAMUSCULAR | Status: AC
  Administered 2016-09-30: 180 mg via INTRAVENOUS

## 2016-10-01 MED ORDER — DIPHENHYDRAMINE HCL 50 MG/ML IJ SOLN
25.0000 mg | Freq: Four times a day (QID) | INTRAMUSCULAR | Status: DC
Start: 2016-10-01 — End: 2016-10-01
  Administered 2016-10-01: 25 mg via INTRAVENOUS
  Filled 2016-10-01: qty 1

## 2016-10-01 MED ORDER — AMIODARONE IV BOLUS ONLY 150 MG/100ML
150.0000 mg | Freq: Once | INTRAVENOUS | Status: AC
  Administered 2016-09-30: 150 mg via INTRAVENOUS

## 2016-10-01 MED ORDER — ONDANSETRON HCL 4 MG/2ML IJ SOLN: 4.0000 mg | Freq: Four times a day (QID) | INTRAMUSCULAR | Status: DC | PRN

## 2016-10-01 MED ORDER — MAGNESIUM SULFATE 2 GM/50ML IV SOLN
2.0000 g | Freq: Once | INTRAVENOUS | Status: AC
  Administered 2016-09-30: 2 g via INTRAVENOUS

## 2016-10-01 MED ORDER — MIDAZOLAM HCL 2 MG/2ML IJ SOLN
2.0000 mg | Freq: Once | INTRAMUSCULAR | Status: AC
  Administered 2016-10-01: 2 mg via INTRAVENOUS

## 2016-10-01 MED ORDER — ACETAMINOPHEN 325 MG PO TABS: 650.0000 mg | ORAL_TABLET | Freq: Four times a day (QID) | ORAL | Status: DC | PRN

## 2016-10-01 MED ORDER — DEXTROSE 5 % IV SOLN: 60.0000 mg/h | Freq: Once | INTRAVENOUS | Status: DC

## 2016-10-01 MED ORDER — ALUM & MAG HYDROXIDE-SIMETH 200-200-20 MG/5ML PO SUSP
15.0000 mL | ORAL | Status: DC | PRN
  Administered 2016-10-01 – 2016-10-02 (×2): 15 mL via ORAL
  Filled 2016-10-01 (×3): qty 30

## 2016-10-01 MED ORDER — SODIUM CHLORIDE 0.9 % IV SOLN
INTRAVENOUS | Status: DC
  Administered 2016-10-01: 03:00:00 via INTRAVENOUS

## 2016-10-01 MED ORDER — DEXAMETHASONE SODIUM PHOSPHATE 10 MG/ML IJ SOLN
10.0000 mg | Freq: Once | INTRAMUSCULAR | Status: AC
  Administered 2016-09-30: 10 mg via INTRAVENOUS

## 2016-10-01 MED ORDER — TRAMADOL HCL 50 MG PO TABS
50.0000 mg | ORAL_TABLET | Freq: Two times a day (BID) | ORAL | Status: DC
  Administered 2016-10-01 – 2016-10-02 (×3): 50 mg via ORAL
  Filled 2016-10-01 (×5): qty 1

## 2016-10-01 MED ORDER — PRAMIPEXOLE DIHYDROCHLORIDE 0.25 MG PO TABS
0.2500 mg | ORAL_TABLET | Freq: Two times a day (BID) | ORAL | Status: DC
  Administered 2016-10-01: 0.25 mg via ORAL
  Filled 2016-10-01 (×2): qty 1

## 2016-10-01 MED ORDER — ALBUTEROL SULFATE (2.5 MG/3ML) 0.083% IN NEBU: 2.5000 mg | INHALATION_SOLUTION | Freq: Four times a day (QID) | RESPIRATORY_TRACT | Status: DC | PRN

## 2016-10-01 MED ORDER — ACETAMINOPHEN 650 MG RE SUPP: 650.0000 mg | Freq: Four times a day (QID) | RECTAL | Status: DC | PRN

## 2016-10-01 MED ORDER — ORAL CARE MOUTH RINSE
15.0000 mL | OROMUCOSAL | Status: DC
  Administered 2016-10-01: 15 mL via OROMUCOSAL

## 2016-10-01 MED ORDER — INSULIN ASPART 100 UNIT/ML ~~LOC~~ SOLN
0.0000 [IU] | SUBCUTANEOUS | Status: DC
  Administered 2016-10-01: 3 [IU] via SUBCUTANEOUS
  Administered 2016-10-01: 5 [IU] via SUBCUTANEOUS
  Administered 2016-10-01 – 2016-10-02 (×2): 2 [IU] via SUBCUTANEOUS
  Administered 2016-10-02: 5 [IU] via SUBCUTANEOUS
  Administered 2016-10-02: 2 [IU] via SUBCUTANEOUS
  Administered 2016-10-02 (×2): 3 [IU] via SUBCUTANEOUS
  Filled 2016-10-01 (×9): qty 1

## 2016-10-01 MED ORDER — TRAMADOL HCL 50 MG PO TABS: 50.0000 mg | ORAL_TABLET | Freq: Two times a day (BID) | ORAL | Status: DC

## 2016-10-01 MED ORDER — DEXAMETHASONE SODIUM PHOSPHATE 10 MG/ML IJ SOLN
10.0000 mg | Freq: Four times a day (QID) | INTRAMUSCULAR | Status: DC
  Administered 2016-10-01: 10 mg via INTRAVENOUS
  Filled 2016-10-01 (×2): qty 1

## 2016-10-01 MED ORDER — DEXAMETHASONE SODIUM PHOSPHATE 10 MG/ML IJ SOLN
10.0000 mg | Freq: Four times a day (QID) | INTRAMUSCULAR | Status: DC
  Administered 2016-10-01 – 2016-10-04 (×11): 10 mg via INTRAVENOUS
  Filled 2016-10-01 (×15): qty 1

## 2016-10-01 MED ORDER — TRAMADOL HCL 50 MG PO TABS
50.0000 mg | ORAL_TABLET | Freq: Two times a day (BID) | ORAL | Status: DC
  Administered 2016-10-01: 50 mg via ORAL
  Filled 2016-10-01: qty 1

## 2016-10-01 MED ORDER — PANTOPRAZOLE SODIUM 40 MG PO TBEC
40.0000 mg | DELAYED_RELEASE_TABLET | Freq: Every day | ORAL | Status: DC
  Administered 2016-10-02 – 2016-10-06 (×6): 40 mg via ORAL
  Filled 2016-10-01 (×5): qty 1

## 2016-10-01 MED ORDER — VANCOMYCIN HCL IN DEXTROSE 1-5 GM/200ML-% IV SOLN
1000.0000 mg | Freq: Two times a day (BID) | INTRAVENOUS | Status: DC
  Filled 2016-10-01 (×2): qty 200

## 2016-10-01 MED ORDER — PRAMIPEXOLE DIHYDROCHLORIDE 0.25 MG PO TABS
0.2500 mg | ORAL_TABLET | Freq: Two times a day (BID) | ORAL | Status: DC
  Administered 2016-10-01: 0.25 mg via ORAL
  Filled 2016-10-01: qty 1

## 2016-10-01 MED ORDER — IPRATROPIUM BROMIDE 0.02 % IN SOLN: 0.5000 mg | Freq: Four times a day (QID) | RESPIRATORY_TRACT | Status: DC | PRN

## 2016-10-01 MED ORDER — ENOXAPARIN SODIUM 40 MG/0.4ML ~~LOC~~ SOLN
40.0000 mg | SUBCUTANEOUS | Status: DC
  Administered 2016-10-01 – 2016-10-02 (×2): 40 mg via SUBCUTANEOUS
  Filled 2016-10-01 (×2): qty 0.4

## 2016-10-01 MED ORDER — PIPERACILLIN-TAZOBACTAM 3.375 G IVPB
3.3750 g | Freq: Three times a day (TID) | INTRAVENOUS | Status: DC
  Administered 2016-10-01: 3.375 g via INTRAVENOUS
  Filled 2016-10-01 (×3): qty 50

## 2016-10-01 MED ORDER — VANCOMYCIN HCL IN DEXTROSE 1-5 GM/200ML-% IV SOLN
1000.0000 mg | Freq: Once | INTRAVENOUS | Status: AC
  Administered 2016-10-01: 1000 mg via INTRAVENOUS
  Filled 2016-10-01: qty 200

## 2016-10-01 MED ORDER — AMIODARONE HCL IN DEXTROSE 360-4.14 MG/200ML-% IV SOLN
30.0000 mg/h | INTRAVENOUS | Status: DC
  Administered 2016-09-30: 60 mg/h via INTRAVENOUS
  Administered 2016-10-01: 30 mg/h via INTRAVENOUS
  Filled 2016-10-01: qty 200

## 2016-10-01 MED ORDER — AMIODARONE HCL IN DEXTROSE 360-4.14 MG/200ML-% IV SOLN
60.0000 mg/h | INTRAVENOUS | Status: DC
Start: 2016-10-01 — End: 2016-10-01
  Administered 2016-09-30: 60 mg/h via INTRAVENOUS

## 2016-10-01 MED ORDER — ONDANSETRON HCL 4 MG PO TABS: 4.0000 mg | ORAL_TABLET | Freq: Four times a day (QID) | ORAL | Status: DC | PRN

## 2016-10-01 MED ORDER — DEXAMETHASONE SODIUM PHOSPHATE 10 MG/ML IJ SOLN
20.0000 mg | Freq: Four times a day (QID) | INTRAMUSCULAR | Status: DC
  Filled 2016-10-01: qty 2

## 2016-10-01 MED ORDER — DIPHENHYDRAMINE HCL 50 MG/ML IJ SOLN
12.5000 mg | Freq: Four times a day (QID) | INTRAMUSCULAR | Status: AC
  Administered 2016-10-01 – 2016-10-02 (×4): 12.5 mg via INTRAVENOUS
  Filled 2016-10-01 (×4): qty 1

## 2016-10-01 MED ORDER — FAMOTIDINE IN NACL 20-0.9 MG/50ML-% IV SOLN
20.0000 mg | Freq: Two times a day (BID) | INTRAVENOUS | Status: DC
  Administered 2016-10-01 (×2): 20 mg via INTRAVENOUS
  Filled 2016-10-01 (×2): qty 50

## 2016-10-01 MED ORDER — ORAL CARE MOUTH RINSE
15.0000 mL | Freq: Two times a day (BID) | OROMUCOSAL | Status: DC
  Administered 2016-10-01 – 2016-10-06 (×6): 15 mL via OROMUCOSAL

## 2016-10-01 MED ORDER — CHLORHEXIDINE GLUCONATE 0.12% ORAL RINSE (MEDLINE KIT)
15.0000 mL | Freq: Two times a day (BID) | OROMUCOSAL | Status: DC
  Administered 2016-10-01: 15 mL via OROMUCOSAL

## 2016-10-01 MED ORDER — PROPOFOL 1000 MG/100ML IV EMUL
5.0000 ug/kg/min | INTRAVENOUS | Status: DC
  Administered 2016-10-01: 30 ug/kg/min via INTRAVENOUS
  Administered 2016-10-01: 50 ug/kg/min via INTRAVENOUS
  Administered 2016-10-01: 30 ug/kg/min via INTRAVENOUS
  Filled 2016-10-01 (×2): qty 100

## 2016-10-01 MED FILL — Medication: Qty: 1 | Status: AC

## 2016-10-01 NOTE — Anesthesia Postprocedure Evaluation (Signed)
Anesthesia Post Note  Patient: ALEXI GEIBEL  Procedure(s) Performed: * No procedures listed *  Patient location during evaluation: SICU Anesthesia Type: General Level of consciousness: sedated Pain management: pain level controlled Vital Signs Assessment: post-procedure vital signs reviewed and stable Respiratory status: patient remains intubated per anesthesia plan Cardiovascular status: stable Anesthetic complications: no Comments: Patient was intubated twice back to back. Patient had RSI for intubation induction.  With first intubation ETT was visualized passing through vocal cords with glidescope.  But poor color change with CO2 monitor and prolonged desaturation to 50% post intubation so patient was extubated and re intubated by MDA using the glidescope.  However, his saturation increased to 90% between intubations without any mask ventilation.  Posterior oropharynx was clear with no gastric secretions or contents for both intubations.       Last Vitals:  Vitals:   10/01/16 0400 10/01/16 0500  BP: 94/72 94/68  Pulse: 64 61  Resp: 18 20  Temp: 37.5 C 37.3 C    Last Pain:  Vitals:   10/01/16 0315  TempSrc: Oral                 Precious Haws Piscitello

## 2016-10-01 NOTE — Consult Note (Signed)
Name: Donald Riley MRN: [id number] DOB: June 24, 1933    ADMISSION DATE:  09/30/2016  CONSULTATION DATE: 10/01/16  REFERRING MD :  Dr. Almyra Free  CHIEF COMPLAINT: Allergic reaction  BRIEF PATIENT DESCRIPTION: 83 year old male emergently intubated for airway protection secondary to anaphylactic reaction with unclear etiology.  SIGNIFICANT EVENTS  7/5 Patient presented to the ED with respiratory distress secondary to angioedema 7/5 Patient was emergently Intubated for airway protection  STUDIES:  none  HISTORY OF PRESENT ILLNESS:  [name] is an 83 year old male with known history of Asthma, Atrial Fibrillation,GERD,Hypertension, Osteoporosis and sleep apnea.  Patient presents to ED on 7/4 in respiratory distress.  His tongue and throat were noted to be very edematous. Patient had ate at a barbecue place but he did not order any new food and he does not have any food allergies either.  The only other significant finding per family was the use of TENS unit on his neck for the first time.   Patient was emergently intubated  For airway protection by anesthetist as he was a difficult intubation.  Patient is sent to the ICU and CCM team was consulted for further management.  PAST MEDICAL HISTORY :   has a past medical history of Arthritis; Asthma; Atrial fibrillation ([place]); Cancer [facility]); GERD (gastroesophageal reflux disease); Glaucoma; Gout; Heart murmur; Hypertension; Neuropathy; Osteoporosis; and Sleep apnea.  has a past surgical history that includes Bunionectomy; ostatectomy; Joint replacement; Arthroplasty (Right); and Cardiac catheterization (N/A, 07/02/2015). Prior to Admission medications   Medication Sig Start Date End Date Taking? Authorizing Provider  albuterol (VENTOLIN HFA) 108 (90 BASE) MCG/ACT inhaler Inhale into the lungs every 4 (four) hours as needed.  01/30/14   [provider]  alendronate (FOSAMAX) 70 MG tablet Take 70 mg by mouth. Reported on 07/02/2015    [provider]  apixaban (ELIQUIS) 5 MG TABS tablet Take by mouth. 07/18/14   [provider]  ARTIFICIAL TEARS 0.1-0.3 % SOLN Reported on 07/02/2015 05/19/12   [provider]  beta carotene w/minerals (OCUVITE) tablet Take 1 tablet by mouth.    [provider]  brimonidine-timolol (COMBIGAN) 0.2-0.5 % ophthalmic solution 1 drop 2 (two) times daily.    [provider]  Calcium Carb-Cholecalciferol (CALCIUM PLUS VITAMIN D3) 600-500 MG-UNIT CAPS Take by mouth 2 (two) times daily.  05/19/12   [provider]  colchicine (COLCRYS) 0.6 MG tablet Take 0.6 mg by mouth. 01/31/13   [provider]  diltiazem (CARDIZEM CD) 240 MG 24 hr capsule Reported on 07/02/2015 12/19/14   [provider]  flecainide (TAMBOCOR) 50 MG tablet Take 100 mg by mouth. Reported on 07/02/2015 05/19/12   [provider]  glucosamine-chondroitin (GLUCOSAMINE-CHONDROITIN DS) 500-400 MG tablet Take by mouth.    [provider]  Hydrocodone-Acetaminophen 5-300 MG TABS Take by mouth.    [provider]  ketotifen (ZADITOR) 0.025 % ophthalmic solution  05/19/12   [provider]  latanoprost (XALATAN) 0.005 % ophthalmic solution Reported on 07/02/2015 01/30/13   [provider]  losartan (COZAAR) 100 MG tablet Take 100 mg by mouth. Reported on 07/02/2015 05/28/11   [provider]  Lutein 20 MG CAPS Take 20 mg by mouth. 05/19/12   [provider]  meloxicam (MOBIC) 15 MG tablet Take 15 mg by mouth. Reported on 07/02/2015 05/28/11   [provider]  metoprolol succinate (TOPROL-XL) 25 MG 24 hr tablet Take 25 mg by mouth. Reported on 07/02/2015 05/19/12  [provider]  mometasone [facility]) 220 MCG/INH inhaler Inhale 2 puffs into the lungs daily.    [provider]  mometasone-formoterol (DULERA) 200-5 MCG/ACT AERO Inhale into the lungs. Reported on 07/02/2015 01/30/14   [provider]  pantoprazole  (PROTONIX) 40 MG tablet Take 40 mg by mouth. Reported on 07/02/2015 05/19/12   [provider]  pramipexole (MIRAPEX) 0.25 MG tablet Take 0.25 mg by mouth.     [provider]  predniSONE (DELTASONE) 20 MG tablet For asthma flare take 60 mg/day for 5 days, 40 mg/d x 5 days, 120 mg/d x 5 days and 10 mg/d x 5 days. 08/01/13   [provider]  ranitidine (ZANTAC) 150 MG tablet  12/19/14   [provider]  rosuvastatin (CRESTOR) 40 MG tablet Take 140 mg by mouth. Reported on 07/02/2015    [provider]  tiotropium (SPIRIVA HANDIHALER) 18 MCG inhalation capsule Place into inhaler and inhale. Reported on 07/02/2015 01/30/14   [provider]  traMADol (ULTRAM) 50 MG tablet 50 mg. 03/08/13   [provider]   No Known Allergies  FAMILY HISTORY:  family history is not on file. SOCIAL HISTORY:  reports that he has never smoked. He has never used smokeless tobacco. He reports that he does not drink alcohol or use drugs.  REVIEW OF SYSTEMS:   Constitutional: Negative for fever, chills, weight loss, malaise/fatigue and diaphoresis.  HENT: Negative for hearing loss, ear pain, nosebleeds, congestion, sore throat, neck pain, tinnitus and ear discharge.   Eyes: Negative for blurred vision, double vision, photophobia, pain, discharge and redness.  Respiratory: Negative for cough, hemoptysis, sputum production, shortness of breath, wheezing and stridor.   Cardiovascular: Negative for chest pain, palpitations, orthopnea, claudication, leg swelling and PND.  Gastrointestinal: Negative for heartburn, nausea, vomiting, abdominal pain, diarrhea, constipation, blood in stool and melena.  Genitourinary: Negative for dysuria, urgency, frequency, hematuria and flank pain.  Musculoskeletal: Negative for myalgias, back pain, joint pain and falls.  Skin: Negative for itching and rash.  Neurological: Negative for dizziness, tingling, tremors, sensory change, speech  change, focal weakness, seizures, loss of consciousness, weakness and headaches.  Endo/Heme/Allergies: Negative for environmental allergies and polydipsia. Does not bruise/bleed easily.  SUBJECTIVE: unable to obtain as the patient is intubated and on mechanical ventilation  VITAL SIGNS: Temp:  [97.3 F (36.3 C)-100.6 F (38.1 C)] 99.9 F (37.7 C) (07/05 0230) Pulse Rate:  [28-105] 70 (07/05 0230) Resp:  [14-28] 22 (07/05 0230) BP: (107-179)/(70-160) 116/88 (07/05 0230) SpO2:  [74 %-100 %] 98 % (07/05 0230) FiO2 (%):  [100 %] 100 % (07/04 2345) Weight:  [190 lb (86.2 kg)] 190 lb (86.2 kg) (07/04 2313)  PHYSICAL EXAMINATION: General:  Elderly male, intubated and on mechanical ventilation Neuro: sedated HEENT:  AT,[place], Swollen lips and tongue, no JVD Cardiovascular:  S1S2,regular Lungs:  Clear bilaterally , no wheezes,crackles,rhonchi Abdomen: obese,round,soft,+BS Musculoskeletal:  No edema,cyanosis noted Skin:  Warm,dry and intact   Recent Labs Lab 09/30/16 2320  NA 138  K 4.3  CL 103  CO2 25  BUN 28*  CREATININE 1.07  GLUCOSE 181*    Recent Labs Lab 09/30/16 2320  HGB 18.6*  HCT 56.1*  WBC 10.1  PLT 193   Dg Chest Portable 1 View  Result Date: 09/30/2016 CLINICAL DATA:  Endotracheal tube placement.  Initial encounter. EXAM: PORTABLE CHEST 1 VIEW COMPARISON:  Chest radiograph performed 07/19/2010 FINDINGS: The patient's endotracheal tube is seen ending 4 cm above the carina. Lung expansion  is mildly decreased. Increased interstitial markings are noted. Right apical and perihilar opacity could reflect pneumonia. Mild bibasilar scarring is noted. No pleural effusion or pneumothorax is seen. The cardiomediastinal silhouette is borderline normal in size. No acute osseous abnormalities are seen. External pacing pads are noted. IMPRESSION: 1. Endotracheal tube seen ending 4 cm above the carina. 2. Increased interstitial markings. Right apical and perihilar opacity could  reflect pneumonia. Followup PA and lateral chest X-ray is recommended in 3-4 weeks following trial of antibiotic therapy to ensure resolution and exclude underlying malignancy. Electronically Signed   By: Garald Balding M.D.   On: 09/30/2016 23:50    ASSESSMENT / PLAN:  Acute Respiratory failure secondary to angioedema of uncertain etiology Anaphylactic reaction, source unknown ?PNA Non Sustained V-Tach  Plan Vent settings established VAP bundle implemented Continue Decadron Continue Pepcid Continue Benadryl Vanc/zosyn  Follow cultures Monitor fever,cbc F/u Echo Trend troponin F/u TSH Bronchodilators  Amio gtt Cards consulted   Bincy Varughese,AG-ACNP Pulmonary and Critical Care Medicine [facility]   10/01/2016, 2:38 AM  PCCM ATTENDING ATTESTATION: I have evaluated patient with the APP Varughese, reviewed database in its entirety and discussed care plan in detail. In addition, this patient was discussed on multidisciplinary rounds.   Important exam findings: Intubated, lethargic Tongue swelling appears resolved Cuff leak present when endotracheal tube cuff is deflated Regular, no murmurs Chest clear Abdomen soft Extremities warm No focal neurologic deficits  CXR: Edema pattern Troponin I: 8.15 EKG: No acute ischemic changes  Major problems addressed by PCCM team: Macroglossia - suspected angioedema. Appears to be markedly improved Elevated troponin I - non-STEMI. Has history of coronary artery disease Doubt pneumonia   PLAN/REC: Extubated under my supervision this morning - his speech pattern suggests mild persistent tongue swelling but there is no evidence of airway obstruction Continue IV diphenhydramine, famotidine Continue to monitor in ICU post extubation NPO until tongue swelling resolved Cardiology consultation requested. Echocardiogram ordered. Discontinue antibiotics  CCM X 45 mins including multiple rechecks post extubation  Merton Border, MD PCCM service Mobile [phone] Pager [phone] 10/01/2016 11:57 AM

## 2016-10-01 NOTE — Anesthesia Preprocedure Evaluation (Signed)
Anesthesia Evaluation  Patient identified by MRN, date of birth, ID band Patient awake    Reviewed: Allergy & Precautions, H&P , NPO status , Patient's Chart, lab work & pertinent test results  Airway Mallampati: IV  TM Distance: >3 FB Neck ROM: limited    Dental  (+) Poor Dentition, Chipped   Pulmonary asthma , sleep apnea ,           Cardiovascular hypertension, + Valvular Problems/Murmurs      Neuro/Psych negative neurological ROS  negative psych ROS   GI/Hepatic Neg liver ROS, GERD  ,  Endo/Other  negative endocrine ROS  Renal/GU      Musculoskeletal  (+) Arthritis ,   Abdominal   Peds  Hematology negative hematology ROS (+)   Anesthesia Other Findings Angioedema    Past Medical History: No date: Arthritis No date: Asthma No date: Atrial fibrillation (HCC) No date: Cancer ([place]) No date: GERD (gastroesophageal reflux disease) No date: Glaucoma No date: Gout No date: Heart murmur No date: Hypertension No date: Neuropathy No date: Osteoporosis No date: Sleep apnea  Past Surgical History: No date: ARTHROPLASTY Right     Comment: ankle No date: BUNIONECTOMY 07/02/2015: CARDIAC CATHETERIZATION N/A     Comment: Procedure: Right/Left Heart Cath and Coronary               Angiography;  Surgeon: Teodoro Spray, MD;                Location: [facility];  Service:               Cardiovascular;  Laterality: N/A; No date: JOINT REPLACEMENT No date: ostatectomy  BMI    Body Mass Index:  30.67 kg/m      Reproductive/Obstetrics negative OB ROS                             Anesthesia Physical Anesthesia Plan  ASA: V and emergent  Anesthesia Plan: General ETT, Rapid Sequence and Cricoid Pressure   Post-op Pain Management:    Induction: Intravenous  PONV Risk Score and Plan:   Airway Management Planned: Oral ETT  Additional Equipment:   Intra-op Plan:    Post-operative Plan: Extubation in OR  Informed Consent: I have reviewed the patients History and Physical, chart, labs and discussed the procedure including the risks, benefits and alternatives for the proposed anesthesia with the patient or authorized representative who has indicated his/her understanding and acceptance.   Dental Advisory Given  Plan Discussed with: Anesthesiologist, CRNA and Surgeon  Anesthesia Plan Comments: (Emergency verbal consent   Patient consented for risks of anesthesia including but not limited to:  - adverse reactions to medications - damage to teeth, lips or other oral mucosa - sore throat or hoarseness - Damage to heart, brain, lungs or loss of life  Patient voiced understanding.)        Anesthesia Quick Evaluation

## 2016-10-01 NOTE — Progress Notes (Signed)
Pt. Was suctioned prior to extubation for a small amount of clear secretions. Per Dr. Alva Garnet order, the pt. Was extubated. He was placed on a 4 L nasal cannula. He has a fairly strong cough and a lot of oral secretions. Minutes later he is voicing.

## 2016-10-01 NOTE — Progress Notes (Signed)
CRITICAL VALUE ALERT  Critical Value:  Troponin 8.15  Date & Time Notied:  10/01/2016 0820  Provider Notified: Dr. Alva Garnet

## 2016-10-01 NOTE — Progress Notes (Signed)
PT Cancellation Note  Patient Details Name: Donald Riley MRN: [id number] DOB: 03/28/1934   Cancelled Treatment:    Reason Eval/Treat Not Completed: Other (comment): Order received and chart reviewed. Today's evaluation contraindicated due to pt's rising troponin levels (<.03 to 8.15 to 18.13) this morning. Will re-attempt evaluation once pt is medically stable/appropriate for therapy.  [name], PT, SPT  [place] 10/01/2016, 1:18 PM

## 2016-10-01 NOTE — Anesthesia Procedure Notes (Signed)
Procedure Name: Intubation Date/Time: 09/30/2016 11:30 AM Performed by: Andria Frames Pre-anesthesia Checklist: Patient identified, Patient being monitored, Timeout performed, Emergency Drugs available and Suction available Patient Re-evaluated:Patient Re-evaluated prior to inductionOxygen Delivery Method: Ambu bag Preoxygenation: Pre-oxygenation with 100% oxygen Intubation Type: IV induction, Rapid sequence and Cricoid Pressure applied Ventilation: Mask ventilation without difficulty Laryngoscope Size: Glidescope and 4 Grade View: Grade II Tube type: Oral Tube size: 6.0 mm Number of attempts: 2 (First by CRNA, 2nd by MD) Airway Equipment and Method: Stylet,  Bougie stylet and Video-laryngoscopy Placement Confirmation: ETT inserted through vocal cords under direct vision,  breath sounds checked- equal and bilateral,  CO2 detector and positive ETCO2 Secured at: 24 cm Tube secured with: Tape Dental Injury: Teeth and Oropharynx as per pre-operative assessment  Difficulty Due To: Difficulty was anticipated, Difficult Airway- due to large tongue, Difficult Airway- due to reduced neck mobility, Difficult Airway- due to anterior larynx, Difficult Airway-  due to edematous airway and Difficult Airway- due to limited oral opening Comments: With first intubation by CRNA ETT visualized passing through vocal cords with glidescope.  But poor color change with CO2 monitor.  Prolong desaturation to 50% post intubation so patient was extubated and re intubated by MD. No gastric contents seen in posterior OP with either intubation.

## 2016-10-01 NOTE — ED Notes (Signed)
Pt very restless. Sedation medication increased

## 2016-10-01 NOTE — ED Notes (Signed)
Pt very restless at this time

## 2016-10-01 NOTE — Progress Notes (Signed)
CRITICAL VALUE ALERT  Critical Value: Troponin 18.13  Date & Time Notied:  10/01/2016 1220  Provider Notified: Dr. Ubaldo Glassing

## 2016-10-01 NOTE — Progress Notes (Signed)
Transported pt to ICU 3 on the vent without incident.

## 2016-10-01 NOTE — Consult Note (Signed)
[facility]  CARDIOLOGY CONSULT NOTE  Patient ID: Donald Riley MRN: [id number] DOB/AGE: [birth year] 83 y.o.  Admit date: 09/30/2016 Referring Physician Dr. Bridgett Larsson Primary Physician   Primary Cardiologist Dr. Ubaldo Glassing Reason for Consultation anaphalaxis/elevated troponin/nsvt  HPI: Pt is an 83 yo male with history of paroxysmal afib which has been in nsr for quite some time on flecainide and diltiazem, not anticoagulated due to history of bleeding, history of hypertension, mild aortic stenosis, cad with cardiac cath in 4/17 revealing occluded lad with collaterals from the left with insignificant disease in lcx and rca with normal lv function who was doing well with no symptoms until yesterday evening developed acute onset of tongue swelling and sob. He was brought to er where he was intubated for airway protection and felt to have an anaphalactic reaction. He was given iv epinephrine for the symptoms and developed nonsustained wide complex tachycardia which spontaneously resolved. He was loaded with iv amiodarone and has subsequantly been extubated. He is currently somewhat weak but denies chet pain or sob. He had taken no new medications yesterday and as far as he knows, ate no new food types. His serum troponin has increased to 18. EKG currently shows nsr. He is hemodynamically stable. Echo is pending.  Review of Systems  Constitutional: Negative.   HENT: Positive for sore throat.   Eyes: Negative.   Respiratory: Negative.   Cardiovascular: Negative.   Gastrointestinal: Negative.   Genitourinary: Negative.   Musculoskeletal: Negative.   Skin: Negative.   Neurological: Negative.   Endo/Heme/Allergies: Negative.   Psychiatric/Behavioral: Negative.     Past Medical History:  Diagnosis Date  . Arthritis   . Asthma   . Atrial fibrillation ([place])   . Cancer ([place])   . GERD (gastroesophageal reflux disease)   . Glaucoma   . Gout   . Heart  murmur   . Hypertension   . Neuropathy   . Osteoporosis   . Sleep apnea     History reviewed. No pertinent family history.  Social History   Social History  . Marital status: Married    Spouse name: N/A  . Number of children: N/A  . Years of education: N/A   Occupational History  . Not on file.   Social History Main Topics  . Smoking status: Never Smoker  . Smokeless tobacco: Never Used  . Alcohol use No  . Drug use: No  . Sexual activity: Not on file   Other Topics Concern  . Not on file   Social History Narrative  . No narrative on file    Past Surgical History:  Procedure Laterality Date  . ARTHROPLASTY Right    ankle  . BUNIONECTOMY    . CARDIAC CATHETERIZATION N/A 07/02/2015   Procedure: Right/Left Heart Cath and Coronary Angiography;  Surgeon: Teodoro Spray, MD;  Location: [facility] [place] [facility];  Service: Cardiovascular;  Laterality: N/A;  . JOINT REPLACEMENT    . ostatectomy       Prescriptions Prior to Admission  Medication Sig Dispense Refill Last Dose  . albuterol (VENTOLIN HFA) 108 (90 BASE) MCG/ACT inhaler Inhale into the lungs every 4 (four) hours as needed.    prn at prn  . Calcium Carb-Cholecalciferol (CALCIUM PLUS VITAMIN D3) 600-500 MG-UNIT CAPS Take by mouth 2 (two) times daily.    Unknown at Unknown  . colchicine (COLCRYS) 0.6 MG tablet Take 0.6 mg by mouth daily.    Past Week at Unknown  time  . diltiazem (CARDIZEM CD) 120 MG 24 hr capsule Take 240 mg by mouth daily.    Past Week at Unknown time  . flecainide (TAMBOCOR) 100 MG tablet Take 100 mg by mouth 2 (two) times daily.    Past Week at Unknown time  . gabapentin (NEURONTIN) 100 MG capsule Take 100 mg by mouth 2 (two) times daily.   Past Week at Unknown time  . glucosamine-chondroitin (GLUCOSAMINE-CHONDROITIN DS) 500-400 MG tablet Take by mouth.   Unknown at Unknown  . meloxicam (MOBIC) 15 MG tablet Take 15 mg by mouth daily as needed for pain. Reported on 07/02/2015   Past Week at Unknown time    . pantoprazole (PROTONIX) 40 MG tablet Take 40 mg by mouth daily. Reported on 07/02/2015   Past Week at Unknown time  . pramipexole (MIRAPEX) 0.25 MG tablet Take 0.25-0.5 mg by mouth 2 (two) times daily.    Past Week at Unknown time  . latanoprost (XALATAN) 0.005 % ophthalmic solution Place 1 drop into both eyes at bedtime.    Not Taking at Unknown time  . tiotropium (SPIRIVA HANDIHALER) 18 MCG inhalation capsule Place 18 mcg into inhaler and inhale daily.    Not Taking at Unknown time    Physical Exam: Blood pressure (!) 148/104, pulse 85, temperature 97.6 F (36.4 C), temperature source Oral, resp. rate 19, height 5\' 10"  (1.778 m), weight 87.6 kg (193 lb 2 oz), SpO2 96 %.   Wt Readings from Last 1 Encounters:  10/01/16 87.6 kg (193 lb 2 oz)     General appearance: alert and cooperative Head: Normocephalic, without obvious abnormality, atraumatic Resp: clear to auscultation bilaterally Chest wall: no tenderness Cardio: regular rate and rhythm GI: soft, non-tender; bowel sounds normal; no masses,  no organomegaly Extremities: extremities normal, atraumatic, no cyanosis or edema Pulses: 2+ and symmetric Neurologic: Grossly normal  Labs:   Lab Results  Component Value Date   WBC 13.6 (H) 10/01/2016   HGB 16.4 10/01/2016   HCT 49.3 10/01/2016   MCV 94.6 10/01/2016   PLT 147 (L) 10/01/2016    Recent Labs Lab 10/01/16 0623  NA 136  K 4.8  CL 106  CO2 25  BUN 27*  CREATININE 1.10  CALCIUM 8.4*  PROT 6.0*  BILITOT 0.7  ALKPHOS 80  ALT 32  AST 102*  GLUCOSE 214*   Lab Results  Component Value Date   TROPONINI 18.13 ([place]) 10/01/2016      Radiology: mild interstitial markings with possible airspace disease. No pulmonary edema EKG: nsr with no ischemia  ASSESSMENT AND PLAN:  83 yo male with history of cad with cardiac cath in April 2017 showing occluded lad with collaterals , insignificant disease in the rca and lcx, Normal ef, remote paroxysmal afib treated with  flecainide and diltiazem and has been in nsr for many years, history of gi bleed with anticoagulation, who was admitted after suffering an anaphalctic reaction of unclear etiology. Had no symptoms of acs prior to or after the event. Troponin elevated to 18 so far. This may be demand due to collaterals in coronary anatomy with episode of hypoxia and difficulty breathing along with an episode of wide complex tachycardia after receiving iv epinephrine. ACS is possible etiology but unlikely etiology. Will review echo when available and if there is an appreciable change, would consider cath , otherwise, will continue amiodarone iv and convert to po. Will d/c flecainide and diltiazem. No anticoagulation at present given gi bleed.  Continue to  attempt to determine etiology of anaphelaxis.  Signed: Teodoro Spray MD, [facility] 10/01/2016, 2:12 PM

## 2016-10-01 NOTE — Progress Notes (Signed)
Pharmacy Antibiotic Note  Donald Riley is a 83 y.o. male admitted on 09/30/2016 with aspiration pneumonia.  Pharmacy has been consulted for vanc/zosyn dosing.  Plan: Will start patient on vanc 1g IV x 1 and zosyn 3.375g IV q8h extended interval infusion  Will follow up w/ vanc 1g q12h w/ 6 hour stack dose. Will draw trough 7/6 @ 2200 prior to 4th dose. Ke 0.0544 T1/2 12 hours Goal trough 15 - 20 mcg/mL  Height: 5\' 10"  (177.8 cm) Weight: 193 lb 2 oz (87.6 kg) IBW/kg (Calculated) : 73  Temp (24hrs), Avg:100 F (37.8 C), Min:97.3 F (36.3 C), Max:100.6 F (38.1 C)   Recent Labs Lab 09/30/16 2320  WBC 10.1  CREATININE 1.07    Estimated Creatinine Clearance: 60.3 mL/min (by C-G formula based on SCr of 1.07 mg/dL).    No Known Allergies   Thank you for allowing pharmacy to be a part of this patient's care.  Tobie Lords, PharmD, BCPS Clinical Pharmacist 10/01/2016

## 2016-10-01 NOTE — Progress Notes (Signed)
[place] [facility] [place] NAME: Donald Riley    MR#:  [id number]  DATE OF BIRTH:  [birth year]  SUBJECTIVE:  CHIEF COMPLAINT:   Chief Complaint  Patient presents with  . Respiratory Distress   No chest pain, palpitation, SOB or leg swelling. Status post extubation this morning, on O2 [place] 4 L. REVIEW OF SYSTEMS:  Review of Systems  Constitutional: Positive for malaise/fatigue. Negative for chills and fever.  HENT: Negative for sore throat.   Eyes: Negative for blurred vision and double vision.  Respiratory: Negative for cough, shortness of breath, wheezing and stridor.   Cardiovascular: Negative for chest pain, palpitations and leg swelling.  Gastrointestinal: Negative for abdominal pain, blood in stool, diarrhea, melena, nausea and vomiting.  Genitourinary: Negative for dysuria, flank pain and hematuria.  Musculoskeletal: Negative for back pain.  Neurological: Negative for dizziness, focal weakness, loss of consciousness, weakness and headaches.  Psychiatric/Behavioral: Negative for depression. The patient is not nervous/anxious.     DRUG ALLERGIES:  No Known Allergies VITALS:  Blood pressure (!) 143/92, pulse 88, temperature 98.2 F (36.8 C), temperature source Axillary, resp. rate (!) 21, height 5\' 10"  (1.778 m), weight 193 lb 2 oz (87.6 kg), SpO2 93 %. PHYSICAL EXAMINATION:  Physical Exam  Constitutional: He is oriented to person, place, and time and well-developed, well-nourished, and in no distress.  HENT:  Head: Normocephalic.  Mouth/Throat: Oropharynx is clear and moist.  Eyes: Conjunctivae and EOM are normal.  Neck: Normal range of motion. Neck supple. No JVD present. No tracheal deviation present.  Cardiovascular: Normal rate and regular rhythm.   Murmur heard. Pulmonary/Chest: Effort normal and breath sounds normal. No respiratory distress. He has no wheezes. He has no rales.  Abdominal: Soft. Bowel sounds are normal. He  exhibits no distension. There is no tenderness.  Musculoskeletal: Normal range of motion. He exhibits no edema or tenderness.  Neurological: He is alert and oriented to person, place, and time. No cranial nerve deficit.  Skin: No rash noted. No erythema.  Psychiatric: Affect normal.   LABORATORY PANEL:  Male CBC  Recent Labs Lab 10/01/16 0623  WBC 13.6*  HGB 16.4  HCT 49.3  PLT 147*   ------------------------------------------------------------------------------------------------------------------ Chemistries   Recent Labs Lab 09/30/16 2320 10/01/16 0623  NA 138 136  K 4.3 4.8  CL 103 106  CO2 25 25  GLUCOSE 181* 214*  BUN 28* 27*  CREATININE 1.07 1.10  CALCIUM 9.0 8.4*  MG 2.1  --   AST 29 102*  ALT 17 32  ALKPHOS 96 80  BILITOT 0.9 0.7   RADIOLOGY:  Dg Chest Portable 1 View  Result Date: 09/30/2016 CLINICAL DATA:  Endotracheal tube placement.  Initial encounter. EXAM: PORTABLE CHEST 1 VIEW COMPARISON:  Chest radiograph performed 07/19/2010 FINDINGS: The patient's endotracheal tube is seen ending 4 cm above the carina. Lung expansion is mildly decreased. Increased interstitial markings are noted. Right apical and perihilar opacity could reflect pneumonia. Mild bibasilar scarring is noted. No pleural effusion or pneumothorax is seen. The cardiomediastinal silhouette is borderline normal in size. No acute osseous abnormalities are seen. External pacing pads are noted. IMPRESSION: 1. Endotracheal tube seen ending 4 cm above the carina. 2. Increased interstitial markings. Right apical and perihilar opacity could reflect pneumonia. Followup PA and lateral chest X-ray is recommended in 3-4 weeks following trial of antibiotic therapy to ensure resolution and exclude underlying malignancy. Electronically Signed   By: Francoise Schaumann.D.  On: 09/30/2016 23:50   ASSESSMENT AND PLAN:   This is a 83 y.o. male with a history of Atrial fibrillation, hypertension, asthma, GERD,  osteoporosis, osteoarthritis and sleep apnea  now being admitted with:  #. Anaphylaxis, Macroglossia - suspected angioedema. -Continue Decadron, Pepcid, Benadryl NPO until tongue swelling resolved  Acute respiratory failure due to above. Status post extubation. Try to wean off oxygen [place]. NEB prn.  #. Elevated troponin, possible due to demanding ischemia secondary to Nonsustained V. Tach, history of CAD. Tronponin 18, ACS is possible etiology but unlikely etiology per Dr. Ubaldo Glassing. given amiodarone 150 mg iv once. Echo: EF 40-45%. Mild aortic stenosis.  #. No pneumonia Discontinued IV Zosyn and Vanco  #. History of atrial fibrillation Not on anticoagulation due to history of GI bleeding.  #. History of GERD - Continue IV Pepcid  I discussed with Dr. Ubaldo Glassing. All the records are reviewed and case discussed with Care Management/Social Worker. Management plans discussed with the patient, His wife and they are in agreement.  CODE STATUS: Full Code  TOTAL TIME TAKING CARE OF THIS PATIENT: 37 minutes.   More than 50% of the time was spent in counseling/coordination of care: YES  POSSIBLE D/C IN 2 DAYS, DEPENDING ON CLINICAL CONDITION.   Demetrios Loll M.D on 10/01/2016 at 5:23 PM  Between 7am to 6pm - Pager - [phone]  After 6pm go to [email or web address] - Proofreader  [facility]  Office  [phone]  CC: Primary care physician; Hortencia Pilar, MD  Note: This dictation was prepared with Dragon dictation along with smaller phrase technology. Any transcriptional errors that result from this process are unintentional.

## 2016-10-01 NOTE — Progress Notes (Signed)
Patient doing well post extubation. Patient alert, oriented, and talking with staff. Patient suctioning own mouth as needed. Patient stated multiple doctors have asked him about what he ate before the reaction. He said he just remembered his night time medications. He states he took Gabapentin, Mirapex, Tramadol, and Latanoprost. Patient states he feels like there was something else but he cannot remember any other medications at this time.

## 2016-10-01 NOTE — Progress Notes (Signed)
*  PRELIMINARY RESULTS* Echocardiogram 2D Echocardiogram has been performed.  Donald Riley 10/01/2016, 3:05 PM

## 2016-10-02 ENCOUNTER — Inpatient Hospital Stay: Payer: Medicare Other

## 2016-10-02 ENCOUNTER — Encounter
Admission: EM | Disposition: A | Discharge status: Home / Self Care | Payer: Self-pay | Source: Home / Self Care | Attending: Internal Medicine

## 2016-10-02 ENCOUNTER — Encounter: Payer: Self-pay | Admitting: *Deleted

## 2016-10-02 DIAGNOSIS — T783XXA Angioneurotic edema, initial encounter: Secondary | ICD-10-CM

## 2016-10-02 DIAGNOSIS — I2510 Atherosclerotic heart disease of native coronary artery without angina pectoris: Secondary | ICD-10-CM

## 2016-10-02 DIAGNOSIS — I2109 ST elevation (STEMI) myocardial infarction involving other coronary artery of anterior wall: Secondary | ICD-10-CM

## 2016-10-02 DIAGNOSIS — I2102 ST elevation (STEMI) myocardial infarction involving left anterior descending coronary artery: Secondary | ICD-10-CM

## 2016-10-02 HISTORY — PX: CORONARY/GRAFT ACUTE MI REVASCULARIZATION: CATH118305

## 2016-10-02 LAB — CBC
HCT: 50.3 % (ref 40.0–52.0)
Hemoglobin: 16.5 g/dL (ref 13.0–18.0)
MCH: 30.8 pg (ref 26.0–34.0)
MCHC: 32.7 g/dL (ref 32.0–36.0)
MCV: 94.3 fL (ref 80.0–100.0)
Platelets: 147 10*3/uL — ABNORMAL LOW (ref 150–440)
RBC: 5.34 MIL/uL (ref 4.40–5.90)
RDW: 15 % — ABNORMAL HIGH (ref 11.5–14.5)
WBC: 15.3 10*3/uL — ABNORMAL HIGH (ref 3.8–10.6)

## 2016-10-02 LAB — BASIC METABOLIC PANEL
Anion gap: 8 (ref 5–15)
BUN: 25 mg/dL — ABNORMAL HIGH (ref 6–20)
CO2: 25 mmol/L (ref 22–32)
Calcium: 8.7 mg/dL — ABNORMAL LOW (ref 8.9–10.3)
Chloride: 104 mmol/L (ref 101–111)
Creatinine, Ser: 0.9 mg/dL (ref 0.61–1.24)
GFR calc Af Amer: >60 mL/min (ref >60)
GFR calc non Af Amer: >60 mL/min (ref >60)
Glucose, Bld: 160 mg/dL — ABNORMAL HIGH (ref 65–99)
Potassium: 4.1 mmol/L (ref 3.5–5.1)
Sodium: 137 mmol/L (ref 135–145)

## 2016-10-02 LAB — GLUCOSE, CAPILLARY
Glucose-Capillary: 183 mg/dL — ABNORMAL HIGH (ref 65–99)
Glucose-Capillary: 138 mg/dL — ABNORMAL HIGH (ref 65–99)
Glucose-Capillary: 157 mg/dL — ABNORMAL HIGH (ref 65–99)
Glucose-Capillary: 199 mg/dL — ABNORMAL HIGH (ref 65–99)
Glucose-Capillary: 206 mg/dL — ABNORMAL HIGH (ref 65–99)

## 2016-10-02 LAB — TROPONIN I: Troponin I: 24.35 ng/mL (ref <0.03)

## 2016-10-02 LAB — POCT ACTIVATED CLOTTING TIME: Activated Clotting Time: 395 seconds

## 2016-10-02 SURGERY — CORONARY/GRAFT ACUTE MI REVASCULARIZATION
Anesthesia: Moderate Sedation

## 2016-10-02 MED ORDER — CLOPIDOGREL BISULFATE 300 MG PO TABS
ORAL_TABLET | ORAL | Status: AC
  Filled 2016-10-02: qty 2

## 2016-10-02 MED ORDER — ASPIRIN 81 MG PO CHEW
CHEWABLE_TABLET | ORAL | Status: DC | PRN
  Administered 2016-10-02: 324 mg via ORAL

## 2016-10-02 MED ORDER — FENTANYL CITRATE (PF) 100 MCG/2ML IJ SOLN
INTRAMUSCULAR | Status: DC | PRN
  Administered 2016-10-02 (×2): 25 ug via INTRAVENOUS

## 2016-10-02 MED ORDER — VERAPAMIL HCL 2.5 MG/ML IV SOLN
INTRAVENOUS | Status: DC | PRN
  Administered 2016-10-02: 2.5 mg via INTRA_ARTERIAL

## 2016-10-02 MED ORDER — ASPIRIN 81 MG PO CHEW
81.0000 mg | CHEWABLE_TABLET | ORAL | Status: DC
Start: 2016-10-03 — End: 2016-10-02

## 2016-10-02 MED ORDER — SODIUM CHLORIDE 0.9% FLUSH
3.0000 mL | Freq: Two times a day (BID) | INTRAVENOUS | Status: DC
  Administered 2016-10-02 – 2016-10-05 (×3): 3 mL via INTRAVENOUS

## 2016-10-02 MED ORDER — SODIUM CHLORIDE 0.9 % IV SOLN: 250.0000 mL | INTRAVENOUS | Status: DC | PRN

## 2016-10-02 MED ORDER — MIDAZOLAM HCL 2 MG/2ML IJ SOLN
INTRAMUSCULAR | Status: DC | PRN
  Administered 2016-10-02: 1 mg via INTRAVENOUS

## 2016-10-02 MED ORDER — SODIUM CHLORIDE 0.9% FLUSH: 3.0000 mL | INTRAVENOUS | Status: DC | PRN

## 2016-10-02 MED ORDER — NITROGLYCERIN 5 MG/ML IV SOLN
INTRAVENOUS | Status: AC
  Filled 2016-10-02: qty 10

## 2016-10-02 MED ORDER — PRAMIPEXOLE DIHYDROCHLORIDE 0.25 MG PO TABS
0.2500 mg | ORAL_TABLET | Freq: Three times a day (TID) | ORAL | Status: DC | PRN
  Administered 2016-10-02 – 2016-10-03 (×3): 0.25 mg via ORAL
  Filled 2016-10-02 (×2): qty 1

## 2016-10-02 MED ORDER — CLOPIDOGREL BISULFATE 75 MG PO TABS
75.0000 mg | ORAL_TABLET | Freq: Every day | ORAL | Status: DC
  Administered 2016-10-03 – 2016-10-06 (×4): 75 mg via ORAL
  Filled 2016-10-02 (×4): qty 1

## 2016-10-02 MED ORDER — ASPIRIN 81 MG PO CHEW
81.0000 mg | CHEWABLE_TABLET | Freq: Every day | ORAL | Status: DC
  Administered 2016-10-03 – 2016-10-06 (×5): 81 mg via ORAL
  Filled 2016-10-02 (×4): qty 1

## 2016-10-02 MED ORDER — NITROGLYCERIN 0.4 MG SL SUBL
SUBLINGUAL_TABLET | SUBLINGUAL | Status: AC
  Filled 2016-10-02: qty 1

## 2016-10-02 MED ORDER — FENTANYL CITRATE (PF) 100 MCG/2ML IJ SOLN
INTRAMUSCULAR | Status: AC
  Filled 2016-10-02: qty 2

## 2016-10-02 MED ORDER — SODIUM CHLORIDE 0.9 % IV SOLN
INTRAVENOUS | Status: AC
  Administered 2016-10-02: 12:00:00 via INTRAVENOUS

## 2016-10-02 MED ORDER — ATORVASTATIN CALCIUM 20 MG PO TABS
20.0000 mg | ORAL_TABLET | Freq: Every day | ORAL | Status: DC
  Administered 2016-10-02: 20 mg via ORAL
  Filled 2016-10-02: qty 1

## 2016-10-02 MED ORDER — SODIUM CHLORIDE 0.9% FLUSH: 3.0000 mL | Freq: Two times a day (BID) | INTRAVENOUS | Status: DC

## 2016-10-02 MED ORDER — ASPIRIN 81 MG PO CHEW
CHEWABLE_TABLET | ORAL | Status: AC
Start: 2016-10-02 — End: 2016-10-02
  Filled 2016-10-02: qty 4

## 2016-10-02 MED ORDER — MIDAZOLAM HCL 2 MG/2ML IJ SOLN
INTRAMUSCULAR | Status: AC
  Filled 2016-10-02: qty 2

## 2016-10-02 MED ORDER — VERAPAMIL HCL 2.5 MG/ML IV SOLN
INTRAVENOUS | Status: AC
  Filled 2016-10-02: qty 2

## 2016-10-02 MED ORDER — HEPARIN (PORCINE) IN NACL 100-0.45 UNIT/ML-% IJ SOLN
1050.0000 [IU]/h | INTRAMUSCULAR | Status: DC
  Administered 2016-10-02: 1150 [IU]/h via INTRAVENOUS
  Filled 2016-10-02: qty 250

## 2016-10-02 MED ORDER — HEPARIN SODIUM (PORCINE) 1000 UNIT/ML IJ SOLN
INTRAMUSCULAR | Status: AC
  Filled 2016-10-02: qty 1

## 2016-10-02 MED ORDER — IOPAMIDOL (ISOVUE-300) INJECTION 61%
INTRAVENOUS | Status: DC | PRN
  Administered 2016-10-02: 300 mL via INTRA_ARTERIAL

## 2016-10-02 MED ORDER — SODIUM CHLORIDE 0.9 % WEIGHT BASED INFUSION
3.0000 mL/kg/h | INTRAVENOUS | Status: DC
  Administered 2016-10-02: 3 mL/kg/h via INTRAVENOUS

## 2016-10-02 MED ORDER — MORPHINE SULFATE (PF) 2 MG/ML IV SOLN
INTRAVENOUS | Status: AC
  Filled 2016-10-02: qty 1

## 2016-10-02 MED ORDER — CLOPIDOGREL BISULFATE 75 MG PO TABS
ORAL_TABLET | ORAL | Status: DC | PRN
  Administered 2016-10-02: 600 mg via ORAL

## 2016-10-02 MED ORDER — INSULIN ASPART 100 UNIT/ML ~~LOC~~ SOLN
0.0000 [IU] | Freq: Three times a day (TID) | SUBCUTANEOUS | Status: DC
  Administered 2016-10-03: 2 [IU] via SUBCUTANEOUS
  Administered 2016-10-03: 3 [IU] via SUBCUTANEOUS
  Administered 2016-10-03: 2 [IU] via SUBCUTANEOUS
  Administered 2016-10-03 – 2016-10-04 (×2): 3 [IU] via SUBCUTANEOUS
  Administered 2016-10-04: 2 [IU] via SUBCUTANEOUS
  Administered 2016-10-04: 3 [IU] via SUBCUTANEOUS
  Administered 2016-10-04 – 2016-10-05 (×3): 2 [IU] via SUBCUTANEOUS
  Filled 2016-10-02 (×10): qty 1

## 2016-10-02 MED ORDER — SODIUM CHLORIDE 0.9 % WEIGHT BASED INFUSION: 1.0000 mL/kg/h | INTRAVENOUS | Status: DC

## 2016-10-02 MED ORDER — HEPARIN SODIUM (PORCINE) 1000 UNIT/ML IJ SOLN
INTRAMUSCULAR | Status: DC | PRN
  Administered 2016-10-02: 10000 [IU] via INTRAVENOUS

## 2016-10-02 MED ORDER — NITROGLYCERIN 1 MG/10 ML FOR IR/CATH LAB
INTRA_ARTERIAL | Status: DC | PRN
  Administered 2016-10-02: 100 ug via INTRACORONARY
  Administered 2016-10-02: 200 ug via INTRACORONARY

## 2016-10-02 SURGICAL SUPPLY — 12 items
BALLN TREK RX 2.5X15 (BALLOONS) ×3
BALLOON TREK RX 2.5X15 (BALLOONS) ×1 IMPLANT
CATH INFINITI JR4 5F (CATHETERS) ×3 IMPLANT
CATH VISTA GUIDE 6FR XB3.5 (CATHETERS) ×3 IMPLANT
DEVICE INFLAT 30 PLUS (MISCELLANEOUS) ×3 IMPLANT
DEVICE RAD TR BAND REGULAR (VASCULAR PRODUCTS) ×3 IMPLANT
GLIDESHEATH SLEND SS 6F .021 (SHEATH) ×3 IMPLANT
KIT MANI 3VAL PERCEP (MISCELLANEOUS) ×3 IMPLANT
PACK CARDIAC CATH (CUSTOM PROCEDURE TRAY) ×3 IMPLANT
STENT RESOLUTE ONYX 3.0X18 (Permanent Stent) ×3 IMPLANT
WIRE INTUITION PROPEL ST 180CM (WIRE) ×6 IMPLANT
WIRE ROSEN-J .035X260CM (WIRE) ×3 IMPLANT

## 2016-10-02 NOTE — Progress Notes (Signed)
[facility]  SUBJECTIVE: Chest, right arm pain with impeding doom in precath area.   Vitals:   10/02/16 0600 10/02/16 0700 10/02/16 0800 10/02/16 0914  BP: (!) 136/95 (!) 138/98 (!) 138/99 (!) 133/93  Pulse: 80 83 84 87  Resp: 15 18 (!) 22 17  Temp:    98.1 F (36.7 C)  TempSrc:    Oral  SpO2: 93% 95% 95% 92%  Weight:      Height:        Intake/Output Summary (Last 24 hours) at 10/02/16 1242 Last data filed at 10/02/16 1218  Gross per 24 hour  Intake              480 ml  Output             1010 ml  Net             -530 ml    LABS: Basic Metabolic Panel:  Recent Labs  09/30/16 2320 10/01/16 0623 10/02/16 0309  NA 138 136 137  K 4.3 4.8 4.1  CL 103 106 104  CO2 25 25 25   GLUCOSE 181* 214* 160*  BUN 28* 27* 25*  CREATININE 1.07 1.10 0.90  CALCIUM 9.0 8.4* 8.7*  MG 2.1  --   --    Liver Function Tests:  Recent Labs  09/30/16 2320 10/01/16 0623  AST 29 102*  ALT 17 32  ALKPHOS 96 80  BILITOT 0.9 0.7  PROT 7.0 6.0*  ALBUMIN 4.1 3.4*   No results for input(s): LIPASE, AMYLASE in the last 72 hours. CBC:  Recent Labs  09/30/16 2320 10/01/16 0623 10/02/16 0309  WBC 10.1 13.6* 15.3*  NEUTROABS 5.7  --   --   HGB 18.6* 16.4 16.5  HCT 56.1* 49.3 50.3  MCV 94.8 94.6 94.3  PLT 193 147* 147*   Cardiac Enzymes:  Recent Labs  10/01/16 1136 10/01/16 1754 10/02/16 0309  TROPONINI 18.13* 32.29* 24.35*   BNP: Invalid input(s): POCBNP D-Dimer: No results for input(s): DDIMER in the last 72 hours. Hemoglobin A1C: No results for input(s): HGBA1C in the last 72 hours. Fasting Lipid Panel:  Recent Labs  10/01/16 0623  CHOL 201*  HDL 82  LDLCALC 105*  TRIG 68  87  CHOLHDL 2.5   Thyroid Function Tests:  Recent Labs  10/01/16 0623  TSH 0.303*   Anemia Panel: No results for input(s): VITAMINB12, FOLATE, FERRITIN, TIBC, IRON, RETICCTPCT in the last 72 hours.   Physical Exam: Blood  pressure (!) 133/93, pulse 87, temperature 98.1 F (36.7 C), temperature source Oral, resp. rate 17, height 5\' 10"  (1.778 m), weight 87.6 kg (193 lb 2 oz), SpO2 92 %.   Wt Readings from Last 1 Encounters:  10/01/16 87.6 kg (193 lb 2 oz)     General appearance: alert and cooperative Resp: clear to auscultation bilaterally Cardio: regular rate and rhythm GI: soft, non-tender; bowel sounds normal; no masses,  no organomegaly Pulses: 2+ and symmetric Neurologic: Grossly normal  TELEMETRY: Reviewed telemetry pt in nsr:  ASSESSMENT AND PLAN:  Active Problems:   Anaphylaxis-etiology unclear   Angio-edema   Nonsustained ventricular tachycardia (HCC)-stable on amiodarone. Will convert to po amiodarone in am.    Acute respiratory failure (HCC)   Airway obstruction   Macroglossia   Non-STEMI (non-ST elevated myocardial infarction) (HCC)   Acute ST elevation myocardial infarction (STEMI) of anterolateral wall (HCC)-had stemi symptoms and ekg in precath holding area and  underwent pci of proximal ald and angioplasty of distal left circumflex.  Will be on heparin overnight and asa and plavix in addition to previous meds .     Teodoro Spray, MD, [facility] 10/02/2016 12:42 PM

## 2016-10-02 NOTE — Progress Notes (Signed)
Patient alert and able to make needs known. Patient complained of indigestion, requested protonix and maalox. Pepcid IV d/c'd. Requested time change for Mirapex and Ultram to in the afternoon and HS. Order received. Patient received Mirapex and Ultram prior to going to sleep. Patient sleeping up in a chair as he states he feels more comfortable sleeping in the chair. Using CPAP. Currently NPO after midnight for possible cardiac cath tomorrow.

## 2016-10-02 NOTE — Progress Notes (Addendum)
[facility] Progess Note    SYNOPSIS   Donald Riley is an 83 year old male with known history of Asthma, Atrial Fibrillation,GERD,Hypertension, Osteoporosis and sleep apnea.  Patient presents to ED on 7/4 in respiratory distress.  His tongue and throat were noted to be very edematous. Patient had ate at a barbecue place but he did not order any new food and he does not have any food allergies either.  The only other significant finding per family was the use of TENS unit on his neck for the first time.   Patient was emergently intubated  For airway protection by anesthetist as he was a difficult intubation.  Patient is sent to the ICU and CCM team was consulted for further management.  ASSESSMENT/PLAN   Status post upper airway swelling requiring intubation. Unclear precipitating etiology. Anaphylactic reaction. Patient is doing well at this time, status post successful extubation presently on Decadron, Pepcid, Benadryl.   Atrial fibrillation. Presently in sinus rhythm. Echocardiography showed ejection fraction 40-45%  Leukocytosis. Had received a dose of vancomycin and Zosyn. No clear evidence of infection at this time, will follow clinically  Prerenal indices. Elevated BUN/creatinine ratio, gentle rehydration  STEMI. Pending catheterization per cardiology. On aspirin, statin, Plavix    Intake/Output Summary (Last 24 hours) at 10/02/16 0817 Last data filed at 10/02/16 0759  Gross per 24 hour  Intake              530 ml  Output             2235 ml  Net            -1705 ml    Name: Donald Riley MRN: [id number] DOB: 1933-09-11    ADMISSION DATE:  09/30/2016  SUBJECTIVE:   Over the last 24 hours patient was successfully extubated. Doing well this morning. No difficulty with respirations or oxygen saturation. Tongue swelling has resolved  VITAL SIGNS: Temp:  [97.6 F (36.4 C)-98.7 F (37.1 C)] 98.4 F (36.9 C) (07/06 0200) Pulse Rate:  [68-92] 80  (07/06 0600) Resp:  [13-28] 15 (07/06 0600) BP: (119-148)/(89-110) 136/95 (07/06 0600) SpO2:  [93 %-98 %] 93 % (07/06 0600)   PHYSICAL EXAMINATION: Physical Examination:   VS: BP (!) 136/95   Pulse 80   Temp 98.4 F (36.9 C) (Axillary)   Resp 15   Ht 5\' 10"  (1.778 m)   Wt 87.6 kg (193 lb 2 oz)   SpO2 93%   BMI 27.71 kg/m   General Appearance: No distress  Neuro:without focal findings, mental status normal. HEENT: PERRLA, EOM intact. Pulmonary: normal breath sounds   Cardiovascular regular rate and rhythm, systolic murmur appreciated aortic post and left parasternal border Abdomen: Benign, Soft, non-tender. Skin:   warm, no rashes, no ecchymosis  Extremities: normal, no cyanosis, clubbing.    LABORATORY PANEL:   CBC  Recent Labs Lab 10/02/16 0309  WBC 15.3*  HGB 16.5  HCT 50.3  PLT 147*    Chemistries   Recent Labs Lab 09/30/16 2320 10/01/16 0623 10/02/16 0309  NA 138 136 137  K 4.3 4.8 4.1  CL 103 106 104  CO2 25 25 25   GLUCOSE 181* 214* 160*  BUN 28* 27* 25*  CREATININE 1.07 1.10 0.90  CALCIUM 9.0 8.4* 8.7*  MG 2.1  --   --   AST 29 102*  --   ALT 17 32  --   ALKPHOS 96 80  --   BILITOT 0.9 0.7  --  Recent Labs Lab 10/01/16 1229 10/01/16 1557 10/01/16 1938 10/01/16 2342 10/02/16 0342 10/02/16 0725  GLUCAP 164* 147* 247* 162* 183* 138*    Recent Labs Lab 10/01/16 0024  PHART 7.31*  PCO2ART 43  PO2ART 183*    Recent Labs Lab 09/30/16 2320 10/01/16 0623  AST 29 102*  ALT 17 32  ALKPHOS 96 80  BILITOT 0.9 0.7  ALBUMIN 4.1 3.4*    Cardiac Enzymes  Recent Labs Lab 10/02/16 0309  TROPONINI 24.35*    RADIOLOGY:  Dg Chest Port 1 View  Result Date: 10/02/2016 CLINICAL DATA:  Respiratory failure EXAM: PORTABLE CHEST 1 VIEW COMPARISON:  September 30, 2016 FINDINGS: The heart size and mediastinal contours are stable. Previously noted endotracheal tube has been removed. There is mild increased pulmonary interstitium  bilaterally. There is a small left pleural effusion with patchy opacity of left lung base. The visualized skeletal structures are stable. IMPRESSION: Mild increased pulmonary interstitium bilaterally improved compared prior exam. Patchy consolidation of left lung base pneumonia is not excluded. There is probably a small left pleural effusion. Electronically Signed   By: Abelardo Diesel M.D.   On: 10/02/2016 07:14   Dg Chest Portable 1 View  Result Date: 09/30/2016 CLINICAL DATA:  Endotracheal tube placement.  Initial encounter. EXAM: PORTABLE CHEST 1 VIEW COMPARISON:  Chest radiograph performed 07/19/2010 FINDINGS: The patient's endotracheal tube is seen ending 4 cm above the carina. Lung expansion is mildly decreased. Increased interstitial markings are noted. Right apical and perihilar opacity could reflect pneumonia. Mild bibasilar scarring is noted. No pleural effusion or pneumothorax is seen. The cardiomediastinal silhouette is borderline normal in size. No acute osseous abnormalities are seen. External pacing pads are noted. IMPRESSION: 1. Endotracheal tube seen ending 4 cm above the carina. 2. Increased interstitial markings. Right apical and perihilar opacity could reflect pneumonia. Followup PA and lateral chest X-ray is recommended in 3-4 weeks following trial of antibiotic therapy to ensure resolution and exclude underlying malignancy. Electronically Signed   By: Garald Balding M.D.   On: 09/30/2016 23:50    Hermelinda Dellen, DO [facility] Office Number: [phone]   10/02/2016

## 2016-10-02 NOTE — Progress Notes (Signed)
Pt transported to Cath Lab via bed, by orderly.

## 2016-10-02 NOTE — Progress Notes (Signed)
On home cpap tolerating well

## 2016-10-02 NOTE — Progress Notes (Signed)
[facility] [place] NAME: Furious Chiarelli    MR#:  [id number]  DATE OF BIRTH:  1933-09-16  SUBJECTIVE:  CHIEF COMPLAINT:   Chief Complaint  Patient presents with  . Respiratory Distress   No chest pain, palpitation, SOB or leg swelling. Chest pain before cardiac cath this am. S/p PCI today. REVIEW OF SYSTEMS:  Review of Systems  Constitutional: Positive for malaise/fatigue. Negative for chills and fever.  HENT: Negative for sore throat.   Eyes: Negative for blurred vision and double vision.  Respiratory: Negative for cough, shortness of breath, wheezing and stridor.   Cardiovascular: Negative for chest pain, palpitations and leg swelling.  Gastrointestinal: Negative for abdominal pain, blood in stool, diarrhea, melena, nausea and vomiting.  Genitourinary: Negative for dysuria, flank pain and hematuria.  Musculoskeletal: Negative for back pain.  Neurological: Negative for dizziness, focal weakness, loss of consciousness, weakness and headaches.  Psychiatric/Behavioral: Negative for depression. The patient is not nervous/anxious.     DRUG ALLERGIES:  No Known Allergies VITALS:  Blood pressure (!) 126/91, pulse 92, temperature 98 F (36.7 C), temperature source Oral, resp. rate 16, height 5\' 10"  (1.778 m), weight 193 lb 2 oz (87.6 kg), SpO2 92 %. PHYSICAL EXAMINATION:  Physical Exam  Constitutional: He is oriented to person, place, and time and well-developed, well-nourished, and in no distress.  HENT:  Head: Normocephalic.  Mouth/Throat: Oropharynx is clear and moist.  Eyes: Conjunctivae and EOM are normal.  Neck: Normal range of motion. Neck supple. No JVD present. No tracheal deviation present.  Cardiovascular: Normal rate and regular rhythm.   Murmur heard. Pulmonary/Chest: Effort normal and breath sounds normal. No respiratory distress. He has no wheezes. He has no rales.  Abdominal: Soft. Bowel sounds are normal. He exhibits no  distension. There is no tenderness.  Musculoskeletal: Normal range of motion. He exhibits no edema or tenderness.  Neurological: He is alert and oriented to person, place, and time. No cranial nerve deficit.  Skin: No rash noted. No erythema.  Psychiatric: Affect normal.   LABORATORY PANEL:  Male CBC  Recent Labs Lab 10/02/16 0309  WBC 15.3*  HGB 16.5  HCT 50.3  PLT 147*   ------------------------------------------------------------------------------------------------------------------ Chemistries   Recent Labs Lab 09/30/16 2320 10/01/16 0623 10/02/16 0309  NA 138 136 137  K 4.3 4.8 4.1  CL 103 106 104  CO2 25 25 25   GLUCOSE 181* 214* 160*  BUN 28* 27* 25*  CREATININE 1.07 1.10 0.90  CALCIUM 9.0 8.4* 8.7*  MG 2.1  --   --   AST 29 102*  --   ALT 17 32  --   ALKPHOS 96 80  --   BILITOT 0.9 0.7  --    RADIOLOGY:  Dg Chest Port 1 View  Result Date: 10/02/2016 CLINICAL DATA:  Respiratory failure EXAM: PORTABLE CHEST 1 VIEW COMPARISON:  September 30, 2016 FINDINGS: The heart size and mediastinal contours are stable. Previously noted endotracheal tube has been removed. There is mild increased pulmonary interstitium bilaterally. There is a small left pleural effusion with patchy opacity of left lung base. The visualized skeletal structures are stable. IMPRESSION: Mild increased pulmonary interstitium bilaterally improved compared prior exam. Patchy consolidation of left lung base pneumonia is not excluded. There is probably a small left pleural effusion. Electronically Signed   By: Abelardo Diesel M.D.   On: 10/02/2016 07:14   ASSESSMENT AND PLAN:   This is a 83 y.o. male with a  history of Atrial fibrillation, hypertension, asthma, GERD, osteoporosis, osteoarthritis and sleep apnea  now being admitted with:  #. Anaphylaxis, Macroglossia - suspected angioedema. Treated with Decadron, Pepcid, Benadryl, improved.  Acute respiratory failure due to above. Status post extubation.   weaned off oxygen [place]. NEB prn.  Nonsustained ventricular tachycardia (HCC)-stable on amiodarone. Will convert to po amiodarone in am.   #. Acute ST elevation myocardial infarction (STEMI) of anterolateral wall. Elevated troponin, S/p PCI today, started aspirin, Plavix and heparin drip per Dr. Ubaldo Glassing. Echo: EF 40-45%. Mild aortic stenosis.  #. No pneumonia Discontinued IV Zosyn and Vanco  #. History of atrial fibrillation Not on anticoagulation due to history of GI bleeding.  #. History of GERD On protonix.  All the records are reviewed and case discussed with Care Management/Social Worker. Management plans discussed with the patient, His wife and they are in agreement.  CODE STATUS: Full Code  TOTAL TIME TAKING CARE OF THIS PATIENT: 37 minutes.   More than 50% of the time was spent in counseling/coordination of care: YES  POSSIBLE D/C IN 2 DAYS, DEPENDING ON CLINICAL CONDITION.   Demetrios Loll M.D on 10/02/2016 at 3:51 PM  Between 7am to 6pm - Pager - [phone]  After 6pm go to [email or web address] - Proofreader  [facility]  Office  [phone]  CC: Primary care physician; Hortencia Pilar, MD  Note: This dictation was prepared with Dragon dictation along with smaller phrase technology. Any transcriptional errors that result from this process are unintentional.

## 2016-10-02 NOTE — Progress Notes (Signed)
Patient transferred to room 259. Report called to [place], Therapist, sports.

## 2016-10-02 NOTE — Progress Notes (Signed)
[facility]  SUBJECTIVE: no chest pain. Feels weak   Vitals:   10/02/16 0300 10/02/16 0400 10/02/16 0500 10/02/16 0600  BP: (!) 144/99 (!) 137/93  (!) 136/95  Pulse: 86 85 89 80  Resp: 16 15 19 15   Temp:      TempSrc:      SpO2: 94% 98% 95% 93%  Weight:      Height:        Intake/Output Summary (Last 24 hours) at 10/02/16 0753 Last data filed at 10/02/16 0600  Gross per 24 hour  Intake           541.21 ml  Output             2135 ml  Net         -1593.79 ml    LABS: Basic Metabolic Panel:  Recent Labs  09/30/16 2320 10/01/16 0623 10/02/16 0309  NA 138 136 137  K 4.3 4.8 4.1  CL 103 106 104  CO2 25 25 25   GLUCOSE 181* 214* 160*  BUN 28* 27* 25*  CREATININE 1.07 1.10 0.90  CALCIUM 9.0 8.4* 8.7*  MG 2.1  --   --    Liver Function Tests:  Recent Labs  09/30/16 2320 10/01/16 0623  AST 29 102*  ALT 17 32  ALKPHOS 96 80  BILITOT 0.9 0.7  PROT 7.0 6.0*  ALBUMIN 4.1 3.4*   No results for input(s): LIPASE, AMYLASE in the last 72 hours. CBC:  Recent Labs  09/30/16 2320 10/01/16 0623 10/02/16 0309  WBC 10.1 13.6* 15.3*  NEUTROABS 5.7  --   --   HGB 18.6* 16.4 16.5  HCT 56.1* 49.3 50.3  MCV 94.8 94.6 94.3  PLT 193 147* 147*   Cardiac Enzymes:  Recent Labs  10/01/16 1136 10/01/16 1754 10/02/16 0309  TROPONINI 18.13* 32.29* 24.35*   BNP: Invalid input(s): POCBNP D-Dimer: No results for input(s): DDIMER in the last 72 hours. Hemoglobin A1C: No results for input(s): HGBA1C in the last 72 hours. Fasting Lipid Panel:  Recent Labs  10/01/16 0623  CHOL 201*  HDL 82  LDLCALC 105*  TRIG 68  87  CHOLHDL 2.5   Thyroid Function Tests:  Recent Labs  10/01/16 0623  TSH 0.303*   Anemia Panel: No results for input(s): VITAMINB12, FOLATE, FERRITIN, TIBC, IRON, RETICCTPCT in the last 72 hours.   Physical Exam: Blood pressure (!) 136/95, pulse 80, temperature 98.4 F (36.9 C),  temperature source Axillary, resp. rate 15, height 5\' 10"  (1.778 m), weight 87.6 kg (193 lb 2 oz), SpO2 93 %.   Wt Readings from Last 1 Encounters:  10/01/16 87.6 kg (193 lb 2 oz)     General appearance: alert and cooperative Resp: clear to auscultation bilaterally Cardio: regular rate and rhythm GI: soft, non-tender; bowel sounds normal; no masses,  no organomegaly Extremities: extremities normal, atraumatic, no cyanosis or edema Neurologic: Grossly normal  TELEMETRY: Reviewed telemetry pt in nsr:  ASSESSMENT AND PLAN:  Active Problems:   Anaphylaxis-etiology unclear after long discussion regarding possible meds and/or food groups. No obvious etiology.   Angio-edema   Nonsustained ventricular tachycardia (HCC)-may have been secondary to getting iv epinephrine vs ischemia. Will continue with iv amiodarone and convert to po at discharge   Acute respiratory failure (HCC)   Airway obstruction   Macroglossia   Non-STEMI (non-ST elevated myocardial infarction) (HCC)-etiology unclear. Had occluded lad in the past with collaterals. Medical management was  recommended after cath in 4/17. Troponin increased to greater than 30. Will proceed with left cardiac cath after discussion with patient and his son. Further recs after cath.     Teodoro Spray, MD, [facility] [address] 10/02/2016 7:53 AM

## 2016-10-02 NOTE — Progress Notes (Signed)
CH responded to a PG from the Cath Lab. Pt was scheduled for a procedure which was stepped up as the Pt was having a heart attack. [place] located Wife and Daughter in the [place] area. [place] spent time with the family as they waited. CH provided the ministry of prayer. Family appreciated the visit and stated that their pastor has been contacted. Pt has been transferred to IC-03.    10/02/16 1300  Clinical Encounter Type  Visited With Family  Visit Type Initial;Spiritual support;Patient in surgery;Code (Code Stemi)  Referral From Nurse  Consult/Referral To Chaplain  Spiritual Encounters  Spiritual Needs Prayer;Emotional

## 2016-10-02 NOTE — Progress Notes (Signed)
ANTICOAGULATION CONSULT NOTE  Pharmacy Consult for Heparin Indication: chest pain/ACS  STEMI s/p PCI  No Known Allergies  Patient Measurements: Height: 5\' 10"  (177.8 cm) Weight: 193 lb 2 oz (87.6 kg) IBW/kg (Calculated) : 73 Heparin Dosing Weight: 87.6 kg  Vital Signs: Temp: 98.1 F (36.7 C) (07/06 0914) Temp Source: Oral (07/06 0914) BP: 133/93 (07/06 0914) Pulse Rate: 87 (07/06 0914)  Labs:  Recent Labs  09/30/16 2320 10/01/16 0623 10/01/16 1136 10/01/16 1754 10/02/16 0309  HGB 18.6* 16.4  --   --  16.5  HCT 56.1* 49.3  --   --  50.3  PLT 193 147*  --   --  147*  APTT  --  31  --   --   --   LABPROT  --  13.8  --   --   --   INR  --  1.06  --   --   --   CREATININE 1.07 1.10  --   --  0.90  TROPONINI <0.03 8.15* 18.13* 32.29* 24.35*    Estimated Creatinine Clearance: 71.7 mL/min (by C-G formula based on SCr of 0.9 mg/dL).   Medical History: Past Medical History:  Diagnosis Date  . Arthritis   . Asthma   . Atrial fibrillation ([place])   . Cancer ([place])   . GERD (gastroesophageal reflux disease)   . Glaucoma   . Gout   . Heart murmur   . Hypertension   . Neuropathy   . Osteoporosis   . Sleep apnea     Assessment: 83 y/o M admitted with anaphylaxis with subsequent acute coronary syndrome during hospitalization requiring PCI.   Goal of Therapy:  Heparin level 0.3-0.7 units/ml Monitor platelets by anticoagulation protocol: Yes   Plan:  No initial bolus.  Start heparin infusion at 1150 units/hr Check anti-Xa level in 8 hours and daily while on heparin Continue to monitor H&H and platelets  Britney, Captain D 10/02/2016,1:24 PM

## 2016-10-02 NOTE — Progress Notes (Signed)
Dr. Kyra Searles at bedside to speak with patient about Cath.

## 2016-10-02 NOTE — Progress Notes (Signed)
PT Cancellation Note  Patient Details Name: Donald Riley MRN: [id number] DOB: 05-19-1933   Cancelled Treatment:    Reason Eval/Treat Not Completed: Other (comment). Therapy will hold evaluation this date as pt currently pending heart cath. Will re-attempt next date.   Lash Matulich 10/02/2016, 9:25 AM  Greggory Stallion, PT, DPT [phone]

## 2016-10-02 NOTE — Progress Notes (Signed)
PT Cancellation Note  Patient Details Name: Donald Riley MRN: [id number] DOB: [birth year]   Cancelled Treatment:    Reason Eval/Treat Not Completed: Other (comment): Order received and chart reviewed. PT spoke with RN - both agreed it would be better to hold evaluation until tomorrow given pt's current status (fatigue) after heart cath and STEMI in last 12hrs. Will re-attempt tomorrow.  [name], PT, SPT  [place] 10/02/2016, 3:25 PM

## 2016-10-02 NOTE — Progress Notes (Signed)
Pt care assumed at this time.  Report received.  Pt found resting on bed, NAD noted.  Resp even and unlabored. Denies chest pain or SOB.

## 2016-10-02 NOTE — Progress Notes (Signed)
CH made a follow up visit. Pt is resting but awake. Wife is bedside. Pt states he is feeling much better and very fortunate to be in the right place at the right time. Wife is appreciative of all the care given.    10/02/16 1500  Clinical Encounter Type  Visited With Patient;Patient and family together  Visit Type Follow-up;Post-op;Critical Care  Consult/Referral To Chaplain

## 2016-10-03 LAB — CBC
HCT: 44.8 % (ref 40.0–52.0)
Hemoglobin: 15.1 g/dL (ref 13.0–18.0)
MCH: 31.6 pg (ref 26.0–34.0)
MCHC: 33.7 g/dL (ref 32.0–36.0)
MCV: 93.8 fL (ref 80.0–100.0)
Platelets: 133 10*3/uL — ABNORMAL LOW (ref 150–440)
RBC: 4.78 MIL/uL (ref 4.40–5.90)
RDW: 14.9 % — ABNORMAL HIGH (ref 11.5–14.5)
WBC: 18.6 10*3/uL — ABNORMAL HIGH (ref 3.8–10.6)

## 2016-10-03 LAB — BASIC METABOLIC PANEL
Anion gap: 5 (ref 5–15)
BUN: 22 mg/dL — ABNORMAL HIGH (ref 6–20)
CO2: 27 mmol/L (ref 22–32)
Calcium: 8.2 mg/dL — ABNORMAL LOW (ref 8.9–10.3)
Chloride: 108 mmol/L (ref 101–111)
Creatinine, Ser: 0.88 mg/dL (ref 0.61–1.24)
GFR calc Af Amer: >60 mL/min (ref >60)
GFR calc non Af Amer: >60 mL/min (ref >60)
Glucose, Bld: 130 mg/dL — ABNORMAL HIGH (ref 65–99)
Potassium: 4.5 mmol/L (ref 3.5–5.1)
Sodium: 140 mmol/L (ref 135–145)

## 2016-10-03 LAB — GLUCOSE, CAPILLARY
Glucose-Capillary: 189 mg/dL — ABNORMAL HIGH (ref 65–99)
Glucose-Capillary: 144 mg/dL — ABNORMAL HIGH (ref 65–99)
Glucose-Capillary: 157 mg/dL — ABNORMAL HIGH (ref 65–99)
Glucose-Capillary: 122 mg/dL — ABNORMAL HIGH (ref 65–99)
Glucose-Capillary: 123 mg/dL — ABNORMAL HIGH (ref 65–99)

## 2016-10-03 LAB — HEPARIN LEVEL (UNFRACTIONATED)
Heparin Unfractionated: 0.79 IU/mL — ABNORMAL HIGH (ref 0.30–0.70)
Heparin Unfractionated: 0.81 IU/mL — ABNORMAL HIGH (ref 0.30–0.70)
Heparin Unfractionated: 0.44 IU/mL (ref 0.30–0.70)

## 2016-10-03 MED ORDER — ATORVASTATIN CALCIUM 20 MG PO TABS
40.0000 mg | ORAL_TABLET | Freq: Every day | ORAL | Status: DC
  Administered 2016-10-03 – 2016-10-05 (×3): 40 mg via ORAL
  Filled 2016-10-03 (×3): qty 2

## 2016-10-03 MED ORDER — DIGOXIN 0.25 MG/ML IJ SOLN
0.2500 mg | Freq: Once | INTRAMUSCULAR | Status: AC
  Administered 2016-10-03: 0.25 mg via INTRAVENOUS
  Filled 2016-10-03: qty 1

## 2016-10-03 MED ORDER — OXYCODONE-ACETAMINOPHEN 5-325 MG PO TABS
2.0000 | ORAL_TABLET | Freq: Four times a day (QID) | ORAL | Status: DC | PRN
  Administered 2016-10-03: 2 via ORAL
  Filled 2016-10-03: qty 2

## 2016-10-03 MED ORDER — TRAMADOL HCL 50 MG PO TABS
50.0000 mg | ORAL_TABLET | Freq: Two times a day (BID) | ORAL | Status: DC | PRN
  Administered 2016-10-03 – 2016-10-05 (×3): 50 mg via ORAL
  Filled 2016-10-03 (×2): qty 1

## 2016-10-03 MED ORDER — AMIODARONE HCL IN DEXTROSE 360-4.14 MG/200ML-% IV SOLN
INTRAVENOUS | Status: AC
  Filled 2016-10-03: qty 400

## 2016-10-03 MED ORDER — SODIUM CHLORIDE 0.9 % IV SOLN
INTRAVENOUS | Status: DC
  Administered 2016-10-03 – 2016-10-05 (×3): via INTRAVENOUS

## 2016-10-03 MED ORDER — AMIODARONE LOAD VIA INFUSION
150.0000 mg | Freq: Once | INTRAVENOUS | Status: AC
  Administered 2016-10-03: 150 mg via INTRAVENOUS
  Filled 2016-10-03: qty 83.34

## 2016-10-03 MED ORDER — AMIODARONE HCL IN DEXTROSE 360-4.14 MG/200ML-% IV SOLN
60.0000 mg/h | INTRAVENOUS | Status: DC
  Administered 2016-10-03: 60 mg/h via INTRAVENOUS

## 2016-10-03 MED ORDER — AMIODARONE HCL IN DEXTROSE 360-4.14 MG/200ML-% IV SOLN
30.0000 mg/h | INTRAVENOUS | Status: DC
  Administered 2016-10-03 – 2016-10-05 (×4): 30 mg/h via INTRAVENOUS
  Filled 2016-10-03 (×3): qty 200

## 2016-10-03 MED ORDER — HEPARIN (PORCINE) IN NACL 100-0.45 UNIT/ML-% IJ SOLN
850.0000 [IU]/h | INTRAMUSCULAR | Status: DC
  Administered 2016-10-03: 1050 [IU]/h via INTRAVENOUS
  Administered 2016-10-03 – 2016-10-04 (×2): 850 [IU]/h via INTRAVENOUS
  Filled 2016-10-03 (×2): qty 250

## 2016-10-03 NOTE — Progress Notes (Signed)
PT Cancellation Note  Patient Details Name: Donald Riley MRN: [id number] DOB: 17-Jan-1934   Cancelled Treatment:    Reason Eval/Treat Not Completed: Other (comment).  Recent STEMI, elevated troponins and jaw pain this AM.  Will try this afternoon and see how progressing.   Ramond Dial 10/03/2016, 9:44 AM   Mee Hives, PT MS Acute Rehab Dept. Number: [place] [facility] [place]

## 2016-10-03 NOTE — Progress Notes (Signed)
On reassessment, pt states "whatever that was this morning is gone now".

## 2016-10-03 NOTE — Progress Notes (Signed)
Pt's QTc is currently at 480 per central tele. Dr. Ubaldo Glassing notified.

## 2016-10-03 NOTE — Progress Notes (Signed)
  Amiodarone Drug - Drug Interaction Consult Note  Recommendations: Educate on statin ADE  Amiodarone is metabolized by the cytochrome P450 system and therefore has the potential to cause many drug interactions. Amiodarone has an average plasma half-life of 50 days (range 20 to 100 days).   There is potential for drug interactions to occur several weeks or months after stopping treatment and the onset of drug interactions may be slow after initiating amiodarone.   [x]  Statins: Increased risk of myopathy. Simvastatin- restrict dose to 20mg  daily. Other statins: counsel patients to report any muscle pain or weakness immediately.  []  Anticoagulants: Amiodarone can increase anticoagulant effect. Consider warfarin dose reduction. Patients should be monitored closely and the dose of anticoagulant altered accordingly, remembering that amiodarone levels take several weeks to stabilize.  []  Antiepileptics: Amiodarone can increase plasma concentration of phenytoin, the dose should be reduced. Note that small changes in phenytoin dose can result in large changes in levels. Monitor patient and counsel on signs of toxicity.  []  Beta blockers: increased risk of bradycardia, AV block and myocardial depression. Sotalol - avoid concomitant use.  []   Calcium channel blockers (diltiazem and verapamil): increased risk of bradycardia, AV block and myocardial depression.  []   Cyclosporine: Amiodarone increases levels of cyclosporine. Reduced dose of cyclosporine is recommended.  []  Digoxin dose should be halved when amiodarone is started.  []  Diuretics: increased risk of cardiotoxicity if hypokalemia occurs.  []  Oral hypoglycemic agents (glyburide, glipizide, glimepiride): increased risk of hypoglycemia. Patient's glucose levels should be monitored closely when initiating amiodarone therapy.   []  Drugs that prolong the QT interval:  Torsades de pointes risk may be increased with concurrent use - avoid if  possible.  Monitor QTc, also keep magnesium/potassium WNL if concurrent therapy can't be avoided. Marland Kitchen Antibiotics: e.g. fluoroquinolones, erythromycin. . Antiarrhythmics: e.g. quinidine, procainamide, disopyramide, sotalol. . Antipsychotics: e.g. phenothiazines, haloperidol.  . Lithium, tricyclic antidepressants, and methadone. Thank You,   Laural Benes  10/03/2016 3:10 PM

## 2016-10-03 NOTE — Progress Notes (Addendum)
ANTICOAGULATION CONSULT NOTE  Pharmacy Consult for Heparin Indication: chest pain/ACS  STEMI s/p PCI  No Known Allergies  Patient Measurements: Height: 5\' 10"  (177.8 cm) Weight: 193 lb 2 oz (87.6 kg) IBW/kg (Calculated) : 73 Heparin Dosing Weight: 87.6 kg  Vital Signs: Temp: 97.8 F (36.6 C) (07/06 1905) Temp Source: Oral (07/06 1905) BP: 116/74 (07/06 1905) Pulse Rate: 93 (07/06 1905)  Labs:  Recent Labs  10/01/16 0623 10/01/16 1136 10/01/16 1754 10/02/16 0309 10/03/16 0101  HGB 16.4  --   --  16.5 15.1  HCT 49.3  --   --  50.3 44.8  PLT 147*  --   --  147* 133*  APTT 31  --   --   --   --   LABPROT 13.8  --   --   --   --   INR 1.06  --   --   --   --   HEPARINUNFRC  --   --   --   --  0.79*  CREATININE 1.10  --   --  0.90 0.88  TROPONINI 8.15* 18.13* 32.29* 24.35*  --     Estimated Creatinine Clearance: 73.4 mL/min (by C-G formula based on SCr of 0.88 mg/dL).   Medical History: Past Medical History:  Diagnosis Date  . Arthritis   . Asthma   . Atrial fibrillation ([place])   . Cancer ([place])   . GERD (gastroesophageal reflux disease)   . Glaucoma   . Gout   . Heart murmur   . Hypertension   . Neuropathy   . Osteoporosis   . Sleep apnea     Assessment: 83 y/o M admitted with anaphylaxis with subsequent acute coronary syndrome during hospitalization requiring PCI.   Goal of Therapy:  Heparin level 0.3-0.7 units/ml Monitor platelets by anticoagulation protocol: Yes   Plan:  No initial bolus.  Start heparin infusion at 1150 units/hr Check anti-Xa level in 8 hours and daily while on heparin Continue to monitor H&H and platelets   7/7 @ 0100 HL 0.79 supratherapeutic. Will decrease rate to 1050 units/hr and will recheck HL @ 0900. CBC trended down slightly but stable.   Tobie Lords, PharmD, BCPS Clinical Pharmacist 10/03/2016

## 2016-10-03 NOTE — Progress Notes (Signed)
Pt's urine output is amber and has only been 273ml over the last 9 hours. Dr. Bridgett Larsson notified, per his order, will start NS @ 19ml/hr

## 2016-10-03 NOTE — Progress Notes (Signed)
ANTICOAGULATION CONSULT NOTE  Pharmacy Consult for Heparin Indication: chest pain/ACS  STEMI s/p PCI  No Known Allergies  Patient Measurements: Height: 5\' 10"  (177.8 cm) Weight: 193 lb 2 oz (87.6 kg) IBW/kg (Calculated) : 73 Heparin Dosing Weight: 87.6 kg  Vital Signs: Temp: 98.2 F (36.8 C) (07/07 0840) Temp Source: Oral (07/07 0840) BP: 108/64 (07/07 0840) Pulse Rate: 85 (07/07 0840)  Labs:  Recent Labs  10/01/16 0623 10/01/16 1136 10/01/16 1754 10/02/16 0309 10/03/16 0101 10/03/16 0911  HGB 16.4  --   --  16.5 15.1  --   HCT 49.3  --   --  50.3 44.8  --   PLT 147*  --   --  147* 133*  --   APTT 31  --   --   --   --   --   LABPROT 13.8  --   --   --   --   --   INR 1.06  --   --   --   --   --   HEPARINUNFRC  --   --   --   --  0.79* 0.81*  CREATININE 1.10  --   --  0.90 0.88  --   TROPONINI 8.15* 18.13* 32.29* 24.35*  --   --     Estimated Creatinine Clearance: 73.4 mL/min (by C-G formula based on SCr of 0.88 mg/dL).   Medical History: Past Medical History:  Diagnosis Date  . Arthritis   . Asthma   . Atrial fibrillation ([place])   . Cancer ([place])   . GERD (gastroesophageal reflux disease)   . Glaucoma   . Gout   . Heart murmur   . Hypertension   . Neuropathy   . Osteoporosis   . Sleep apnea     Assessment: 83 y/o M admitted with anaphylaxis with subsequent acute coronary syndrome during hospitalization requiring PCI.   Goal of Therapy:  Heparin level 0.3-0.7 units/ml Monitor platelets by anticoagulation protocol: Yes   Plan:  No initial bolus.  Start heparin infusion at 1150 units/hr Check anti-Xa level in 8 hours and daily while on heparin Continue to monitor H&H and platelets   7/7 @ 0100 HL 0.79 supratherapeutic. Will decrease rate to 1050 units/hr and will recheck HL @ 0900. CBC trended down slightly but stable.   7/7 HL@ 0911= 0.81. Will decrease rate to 850 units/hr and recheck HL at 1900.  Chinita [place] PharmD Clinical  Pharmacist 10/03/2016

## 2016-10-03 NOTE — Progress Notes (Signed)
Pt's HR has increased to the 130s and has sustained. Dr. Ubaldo Glassing called. Per his order I will administer a one time dose of IV digoxin 0.25mg 

## 2016-10-03 NOTE — Progress Notes (Signed)
Pt's heparin level is elevated at 0.81. Pharmacy notified, they stated they will modify rate.

## 2016-10-03 NOTE — Progress Notes (Signed)
ANTICOAGULATION CONSULT NOTE  Pharmacy Consult for Heparin Indication: chest pain/ACS  STEMI s/p PCI  No Known Allergies  Patient Measurements: Height: 5\' 10"  (177.8 cm) Weight: 193 lb 2 oz (87.6 kg) IBW/kg (Calculated) : 73 Heparin Dosing Weight: 87.6 kg  Vital Signs: Temp: 97.7 F (36.5 C) (07/07 1105) Temp Source: Oral (07/07 1105) BP: 100/74 (07/07 1813) Pulse Rate: 130 (07/07 1813)  Labs:  Recent Labs  10/01/16 0623 10/01/16 1136 10/01/16 1754 10/02/16 0309 10/03/16 0101 10/03/16 0911 10/03/16 1853  HGB 16.4  --   --  16.5 15.1  --   --   HCT 49.3  --   --  50.3 44.8  --   --   PLT 147*  --   --  147* 133*  --   --   APTT 31  --   --   --   --   --   --   LABPROT 13.8  --   --   --   --   --   --   INR 1.06  --   --   --   --   --   --   HEPARINUNFRC  --   --   --   --  0.79* 0.81* 0.44  CREATININE 1.10  --   --  0.90 0.88  --   --   TROPONINI 8.15* 18.13* 32.29* 24.35*  --   --   --     Estimated Creatinine Clearance: 73.4 mL/min (by C-G formula based on SCr of 0.88 mg/dL).   Medical History: Past Medical History:  Diagnosis Date  . Arthritis   . Asthma   . Atrial fibrillation ([place])   . Cancer ([place])   . GERD (gastroesophageal reflux disease)   . Glaucoma   . Gout   . Heart murmur   . Hypertension   . Neuropathy   . Osteoporosis   . Sleep apnea     Assessment: 83 y/o M admitted with anaphylaxis with subsequent acute coronary syndrome during hospitalization requiring PCI.   Goal of Therapy:  Heparin level 0.3-0.7 units/ml Monitor platelets by anticoagulation protocol: Yes   Plan:  No initial bolus.  Start heparin infusion at 1150 units/hr Check anti-Xa level in 8 hours and daily while on heparin Continue to monitor H&H and platelets   7/7 @ 0100 HL 0.79 supratherapeutic. Will decrease rate to 1050 units/hr and will recheck HL @ 0900. CBC trended down slightly but stable.   7/7 HL@ 0911= 0.81. Will decrease rate to 850 units/hr and  recheck HL at 1900.  7/7 1853 HL therapeutic x 1. Continue current rate. Will recheck in 8 hours.  Makara Lanzo A. [employer], [place].D., BCPS Clinical Pharmacist 10/03/2016

## 2016-10-03 NOTE — Progress Notes (Signed)
[place] at [place] NAME: Donald Riley    MR#:  [id number]  DATE OF BIRTH:  December 08, 1933  SUBJECTIVE:  CHIEF COMPLAINT:   Chief Complaint  Patient presents with  . Respiratory Distress   No chest pain, palpitation, SOB or leg swelling. Chest pain last night. REVIEW OF SYSTEMS:  Review of Systems  Constitutional: Positive for malaise/fatigue. Negative for chills and fever.  HENT: Negative for sore throat.   Eyes: Negative for blurred vision and double vision.  Respiratory: Negative for cough, shortness of breath, wheezing and stridor.   Cardiovascular: Negative for chest pain, palpitations and leg swelling.  Gastrointestinal: Negative for abdominal pain, blood in stool, diarrhea, melena, nausea and vomiting.  Genitourinary: Negative for dysuria, flank pain and hematuria.  Musculoskeletal: Negative for back pain.  Neurological: Negative for dizziness, focal weakness, loss of consciousness, weakness and headaches.  Psychiatric/Behavioral: Negative for depression. The patient is not nervous/anxious.     DRUG ALLERGIES:  No Known Allergies VITALS:  Blood pressure 102/70, pulse 73, temperature 97.7 F (36.5 C), temperature source Oral, resp. rate 16, height 5\' 10"  (1.778 m), weight 193 lb 2 oz (87.6 kg), SpO2 97 %. PHYSICAL EXAMINATION:  Physical Exam  Constitutional: He is oriented to person, place, and time and well-developed, well-nourished, and in no distress.  HENT:  Head: Normocephalic.  Mouth/Throat: Oropharynx is clear and moist.  Eyes: Conjunctivae and EOM are normal.  Neck: Normal range of motion. Neck supple. No JVD present. No tracheal deviation present.  Cardiovascular: Normal rate and regular rhythm.   Murmur heard. Pulmonary/Chest: Effort normal and breath sounds normal. No respiratory distress. He has no wheezes. He has no rales.  Abdominal: Soft. Bowel sounds are normal. He exhibits no distension. There is no  tenderness.  Musculoskeletal: Normal range of motion. He exhibits no edema or tenderness.  Neurological: He is alert and oriented to person, place, and time. No cranial nerve deficit.  Skin: No rash noted. No erythema.  Psychiatric: Affect normal.   LABORATORY PANEL:  Male CBC  Recent Labs Lab 10/03/16 0101  WBC 18.6*  HGB 15.1  HCT 44.8  PLT 133*   ------------------------------------------------------------------------------------------------------------------ Chemistries   Recent Labs Lab 09/30/16 2320 10/01/16 0623  10/03/16 0101  NA 138 136  < > 140  K 4.3 4.8  < > 4.5  CL 103 106  < > 108  CO2 25 25  < > 27  GLUCOSE 181* 214*  < > 130*  BUN 28* 27*  < > 22*  CREATININE 1.07 1.10  < > 0.88  CALCIUM 9.0 8.4*  < > 8.2*  MG 2.1  --   --   --   AST 29 102*  --   --   ALT 17 32  --   --   ALKPHOS 96 80  --   --   BILITOT 0.9 0.7  --   --   < > = values in this interval not displayed. RADIOLOGY:  No results found. ASSESSMENT AND PLAN:   This is a 83 y.o. male with a history of Atrial fibrillation, hypertension, asthma, GERD, osteoporosis, osteoarthritis and sleep apnea  now being admitted with:  #. Anaphylaxis, Macroglossia - suspected angioedema. Treated with Decadron, Pepcid, Benadryl, improved.  Acute respiratory failure due to above. Status post extubation.  weaned off oxygen [place]. NEB prn.  Nonsustained ventricular tachycardia (HCC)-stable on amiodarone. converted to po amiodarone.   #. Acute ST elevation myocardial infarction (STEMI)  of anterolateral wall. Elevated troponin, S/p PCI, started aspirin, Plavix, continue heparin drip per Dr. Ubaldo Glassing. Echo: EF 40-45%. Mild aortic stenosis.  #. No pneumonia Discontinued IV Zosyn and Vanco  #. History of atrial fibrillation Not on anticoagulation due to history of GI bleeding.  #. History of GERD On protonix.  All the records are reviewed and case discussed with Care Dr. Ubaldo Glassing. Management plans  discussed with the patient, His wife and they are in agreement.  CODE STATUS: Full Code  TOTAL TIME TAKING CARE OF THIS PATIENT: 36 minutes.   More than 50% of the time was spent in counseling/coordination of care: YES  POSSIBLE D/C IN 2 DAYS, DEPENDING ON CLINICAL CONDITION.   Demetrios Loll M.D on 10/03/2016 at 12:40 PM  Between 7am to 6pm - Pager - [phone]  After 6pm go to [email or web address] - Proofreader  [facility]  Office  [phone]  CC: Primary care physician; Hortencia Pilar, MD  Note: This dictation was prepared with Dragon dictation along with smaller phrase technology. Any transcriptional errors that result from this process are unintentional.

## 2016-10-03 NOTE — Progress Notes (Signed)
Patient called the RN to his bedside c/o SOB and feeling hot per his statement. . V/S are stable. 94% on R/A. 02 at 2 L applied for comfort. Patient is also complaining of right arm site and bilateral jaw  pain of 8/10 on the pain scale. Dr. Estanislado Pandy notified with a new order for Percocet 5/325 mg 2 tabs every 6 hours for moderate to severer pain. Will continue to monitor.

## 2016-10-03 NOTE — Progress Notes (Signed)
1930 dose of Decadron not given per MD Vachhani's order due to patient's reactions from last two doses. Will reassess patient status closer to next dose. Instructed patient to notify RN of any sudden changes. Earleen Reaper, RN

## 2016-10-03 NOTE — Progress Notes (Addendum)
Central tele called to report that pt transitioned into a-fib with a HR of 155. Pt assessed he states that he can tell he is in a-fib and he is complaining of jaw pain as well. Dr. Bridgett Larsson notified and he asked me to call Dr. Ubaldo Glassing. Dr. Ubaldo Glassing paged, and he stated to start pt back on amiodarone drip with administration of 150mg  loading dose. Orders entered into EPIC per Dr. Ubaldo Glassing.

## 2016-10-03 NOTE — Progress Notes (Signed)
[place]  SUBJECTIVE: chest an jaw pain with recurrent afib. Will treat with iv amiodaorne.    Vitals:   10/03/16 0643 10/03/16 0840 10/03/16 1105 10/03/16 1445  BP: 116/79 108/64 102/70 102/81  Pulse: 100 85 73   Resp:  16    Temp: 98.1 F (36.7 C) 98.2 F (36.8 C) 97.7 F (36.5 C)   TempSrc: Oral Oral Oral   SpO2: 94% 95% 97%   Weight:      Height:        Intake/Output Summary (Last 24 hours) at 10/03/16 1507 Last data filed at 10/03/16 1300  Gross per 24 hour  Intake           181.08 ml  Output              400 ml  Net          -218.92 ml    LABS: Basic Metabolic Panel:  Recent Labs  09/30/16 2320  10/02/16 0309 10/03/16 0101  NA 138  < > 137 140  K 4.3  < > 4.1 4.5  CL 103  < > 104 108  CO2 25  < > 25 27  GLUCOSE 181*  < > 160* 130*  BUN 28*  < > 25* 22*  CREATININE 1.07  < > 0.90 0.88  CALCIUM 9.0  < > 8.7* 8.2*  MG 2.1  --   --   --   < > = values in this interval not displayed. Liver Function Tests:  Recent Labs  09/30/16 2320 10/01/16 0623  AST 29 102*  ALT 17 32  ALKPHOS 96 80  BILITOT 0.9 0.7  PROT 7.0 6.0*  ALBUMIN 4.1 3.4*   No results for input(s): LIPASE, AMYLASE in the last 72 hours. CBC:  Recent Labs  09/30/16 2320  10/02/16 0309 10/03/16 0101  WBC 10.1  < > 15.3* 18.6*  NEUTROABS 5.7  --   --   --   HGB 18.6*  < > 16.5 15.1  HCT 56.1*  < > 50.3 44.8  MCV 94.8  < > 94.3 93.8  PLT 193  < > 147* 133*  < > = values in this interval not displayed. Cardiac Enzymes:  Recent Labs  10/01/16 1136 10/01/16 1754 10/02/16 0309  TROPONINI 18.13* 32.29* 24.35*   BNP: Invalid input(s): POCBNP D-Dimer: No results for input(s): DDIMER in the last 72 hours. Hemoglobin A1C: No results for input(s): HGBA1C in the last 72 hours. Fasting Lipid Panel:  Recent Labs  10/01/16 0623  CHOL 201*  HDL 82  LDLCALC 105*  TRIG 68  87  CHOLHDL 2.5   Thyroid Function  Tests:  Recent Labs  10/01/16 0623  TSH 0.303*   Anemia Panel: No results for input(s): VITAMINB12, FOLATE, FERRITIN, TIBC, IRON, RETICCTPCT in the last 72 hours.   Physical Exam: Blood pressure 102/81, pulse 73, temperature 97.7 F (36.5 C), temperature source Oral, resp. rate 16, height 5\' 10"  (1.778 m), weight 87.6 kg (193 lb 2 oz), SpO2 97 %.   Wt Readings from Last 1 Encounters:  10/01/16 87.6 kg (193 lb 2 oz)     [place]-clear COR-rrr no murmur Radial cath site-ecchymotic with no hematoma Neuro-nonfocal  TELEMETRY: Reviewed telemetry pt in nsr with recurrent afib with rvr this afternoon.   ASSESSMENT AND PLAN:  Active Problems:   Anaphylaxis   Angio-edema   Nonsustained ventricular tachycardia (HCC)-no further vt continue with amiodarone.  Acute respiratory failure (HCC)   Airway obstruction   Macroglossia   Non-STEMI (non-ST elevated myocardial infarction) (HCC)-cath revealed occluded proximal lad involving chronically occluded small diagnonal. Received emergent pci of proximal lad. Continue iwht iv heparin for 24 more hours given thrombus in lesion.    Acute ST elevation myocardial infarction (STEMI) of anterolateral wall (HCC)  afib with rvr-pt had recuvvent afib with rvr this afternoon. Will reload with iv amiodarone and continue heparin. Teodoro Spray, MD, [facility] 10/03/2016 3:07 PM

## 2016-10-04 LAB — BASIC METABOLIC PANEL
Anion gap: 5 (ref 5–15)
BUN: 21 mg/dL — ABNORMAL HIGH (ref 6–20)
CO2: 25 mmol/L (ref 22–32)
Calcium: 7.8 mg/dL — ABNORMAL LOW (ref 8.9–10.3)
Chloride: 106 mmol/L (ref 101–111)
Creatinine, Ser: 0.82 mg/dL (ref 0.61–1.24)
GFR calc Af Amer: >60 mL/min (ref >60)
GFR calc non Af Amer: >60 mL/min (ref >60)
Glucose, Bld: 141 mg/dL — ABNORMAL HIGH (ref 65–99)
Potassium: 4.3 mmol/L (ref 3.5–5.1)
Sodium: 136 mmol/L (ref 135–145)

## 2016-10-04 LAB — CBC
HCT: 43.9 % (ref 40.0–52.0)
Hemoglobin: 14.7 g/dL (ref 13.0–18.0)
MCH: 31.9 pg (ref 26.0–34.0)
MCHC: 33.6 g/dL (ref 32.0–36.0)
MCV: 95.1 fL (ref 80.0–100.0)
Platelets: 118 10*3/uL — ABNORMAL LOW (ref 150–440)
RBC: 4.62 MIL/uL (ref 4.40–5.90)
RDW: 15 % — ABNORMAL HIGH (ref 11.5–14.5)
WBC: 12.8 10*3/uL — ABNORMAL HIGH (ref 3.8–10.6)

## 2016-10-04 LAB — GLUCOSE, CAPILLARY
Glucose-Capillary: 148 mg/dL — ABNORMAL HIGH (ref 65–99)
Glucose-Capillary: 135 mg/dL — ABNORMAL HIGH (ref 65–99)
Glucose-Capillary: 177 mg/dL — ABNORMAL HIGH (ref 65–99)
Glucose-Capillary: 133 mg/dL — ABNORMAL HIGH (ref 65–99)
Glucose-Capillary: 126 mg/dL — ABNORMAL HIGH (ref 65–99)

## 2016-10-04 LAB — HEPARIN LEVEL (UNFRACTIONATED): Heparin Unfractionated: 0.39 IU/mL (ref 0.30–0.70)

## 2016-10-04 LAB — MAGNESIUM: Magnesium: 2.2 mg/dL (ref 1.7–2.4)

## 2016-10-04 MED ORDER — FUROSEMIDE 20 MG PO TABS
20.0000 mg | ORAL_TABLET | Freq: Every day | ORAL | Status: DC
  Administered 2016-10-05 – 2016-10-06 (×2): 20 mg via ORAL
  Filled 2016-10-04 (×2): qty 1

## 2016-10-04 MED ORDER — PRAMIPEXOLE DIHYDROCHLORIDE 0.25 MG PO TABS
0.2500 mg | ORAL_TABLET | Freq: Four times a day (QID) | ORAL | Status: DC | PRN
  Administered 2016-10-04 – 2016-10-05 (×3): 0.25 mg via ORAL
  Filled 2016-10-04 (×3): qty 1

## 2016-10-04 MED ORDER — SENNOSIDES-DOCUSATE SODIUM 8.6-50 MG PO TABS: 1.0000 | ORAL_TABLET | Freq: Every evening | ORAL | Status: DC | PRN

## 2016-10-04 MED ORDER — NYSTATIN 100000 UNIT/ML MT SUSP
5.0000 mL | Freq: Four times a day (QID) | OROMUCOSAL | Status: DC
  Administered 2016-10-04 – 2016-10-05 (×5): 500000 [IU] via ORAL
  Filled 2016-10-04 (×5): qty 5

## 2016-10-04 MED ORDER — BISACODYL 5 MG PO TBEC: 5.0000 mg | DELAYED_RELEASE_TABLET | Freq: Every day | ORAL | Status: DC | PRN

## 2016-10-04 MED ORDER — OXYCODONE-ACETAMINOPHEN 5-325 MG PO TABS
1.0000 | ORAL_TABLET | Freq: Four times a day (QID) | ORAL | Status: DC | PRN
  Administered 2016-10-04: 1 via ORAL
  Filled 2016-10-04: qty 1

## 2016-10-04 NOTE — Progress Notes (Signed)
[place] at [place] NAME: Donald Riley    MR#:  [id number]  DATE OF BIRTH:  February 19, 1934  SUBJECTIVE:  CHIEF COMPLAINT:   Chief Complaint  Patient presents with  . Respiratory Distress   No chest pain, palpitation, SOB or leg swelling. But feels sick and Miserable. He developed new onset of A. fib yesterday and put on amiodarone drip. Heart rate is about 110-120s. REVIEW OF SYSTEMS:  Review of Systems  Constitutional: Positive for malaise/fatigue. Negative for chills and fever.  HENT: Positive for sore throat.   Eyes: Negative for blurred vision and double vision.  Respiratory: Negative for cough, shortness of breath, wheezing and stridor.   Cardiovascular: Negative for chest pain, palpitations and leg swelling.  Gastrointestinal: Negative for abdominal pain, blood in stool, diarrhea, melena, nausea and vomiting.  Genitourinary: Negative for dysuria, flank pain and hematuria.  Musculoskeletal: Negative for back pain.  Neurological: Negative for dizziness, focal weakness, loss of consciousness, weakness and headaches.  Psychiatric/Behavioral: Negative for depression. The patient is not nervous/anxious.     DRUG ALLERGIES:  No Known Allergies VITALS:  Blood pressure 99/68, pulse (!) 110, temperature 98 F (36.7 C), temperature source Oral, resp. rate 18, height 5\' 10"  (1.778 m), weight 193 lb 2 oz (87.6 kg), SpO2 95 %. PHYSICAL EXAMINATION:  Physical Exam  Constitutional: He is oriented to person, place, and time and well-developed, well-nourished, and in no distress.  HENT:  Head: Normocephalic.  Mouth/Throat: Oropharynx is clear and moist.  Eyes: Conjunctivae and EOM are normal.  Neck: Normal range of motion. Neck supple. No JVD present. No tracheal deviation present.  Cardiovascular: Normal rate and regular rhythm.   Murmur heard. Pulmonary/Chest: Effort normal and breath sounds normal. No respiratory distress. He has no wheezes.  He has no rales.  Abdominal: Soft. Bowel sounds are normal. He exhibits no distension. There is no tenderness.  Musculoskeletal: Normal range of motion. He exhibits no edema or tenderness.  Neurological: He is alert and oriented to person, place, and time. No cranial nerve deficit.  Skin: No rash noted. No erythema.  Psychiatric: Affect normal.   LABORATORY PANEL:  Male CBC  Recent Labs Lab 10/04/16 0305  WBC 12.8*  HGB 14.7  HCT 43.9  PLT 118*   ------------------------------------------------------------------------------------------------------------------ Chemistries   Recent Labs Lab 10/01/16 0623  10/04/16 0305  NA 136  < > 136  K 4.8  < > 4.3  CL 106  < > 106  CO2 25  < > 25  GLUCOSE 214*  < > 141*  BUN 27*  < > 21*  CREATININE 1.10  < > 0.82  CALCIUM 8.4*  < > 7.8*  MG  --   --  2.2  AST 102*  --   --   ALT 32  --   --   ALKPHOS 80  --   --   BILITOT 0.7  --   --   < > = values in this interval not displayed. RADIOLOGY:  No results found. ASSESSMENT AND PLAN:   This is a 83 y.o. male with a history of Atrial fibrillation, hypertension, asthma, GERD, osteoporosis, osteoarthritis and sleep apnea  now being admitted with:  #. Anaphylaxis, Macroglossia - suspected angioedema. Treated with Decadron, Pepcid, Benadryl, improved.  Acute respiratory failure due to above. Status post extubation.  weaned off oxygen [place]. NEB prn.  #. Acute ST elevation myocardial infarction (STEMI) of anterolateral wall. Elevated troponin, S/p PCI, continue aspirin,  Plavix, lipitor, continue heparin drip per Dr. Ubaldo Glassing. Echo: EF 40-45%. Mild aortic stenosis.  New-onset A. Fib. Continue amiodarone drip and heparin drip per Dr. Ubaldo Glassing.  Dehydration. on normal saline IV and lasix due to less urine output,  follow-up BMP. Watch for fluid overload.  Nonsustained ventricular tachycardia (HCC) on amiodarone.  #. No pneumonia Discontinued IV Zosyn and Vanco  #. History of atrial  fibrillation Not on anticoagulation due to history of GI bleeding.  #. History of GERD On protonix.  All the records are reviewed and case discussed with Care Dr. Ubaldo Glassing. Management plans discussed with the patient, His wife and they are in agreement.  CODE STATUS: Full Code  TOTAL TIME TAKING CARE OF THIS PATIENT: 36 minutes.   More than 50% of the time was spent in counseling/coordination of care: YES  POSSIBLE D/C IN 2 DAYS, DEPENDING ON CLINICAL CONDITION.   Demetrios Loll M.D on 10/04/2016 at 10:57 AM  Between 7am to 6pm - Pager - [phone]  After 6pm go to [email or web address] - Proofreader  [facility]  Office  [phone]  CC: Primary care physician; Hortencia Pilar, MD  Note: This dictation was prepared with Dragon dictation along with smaller phrase technology. Any transcriptional errors that result from this process are unintentional.

## 2016-10-04 NOTE — Progress Notes (Signed)
[facility]  SUBJECTIVE: sob and jaw pain when afib rate increases   Vitals:   10/03/16 1539 10/03/16 1813 10/03/16 1931 10/04/16 0418  BP: 103/80 100/74 94/66 98/72   Pulse: (!) 135 (!) 130 (!) 109 (!) 105  Resp:   18 18  Temp:   98.3 F (36.8 C) 97.7 F (36.5 C)  TempSrc:   Oral Oral  SpO2:   95% 93%  Weight:      Height:        Intake/Output Summary (Last 24 hours) at 10/04/16 0841 Last data filed at 10/04/16 0420  Gross per 24 hour  Intake           599.12 ml  Output             1000 ml  Net          -400.88 ml    LABS: Basic Metabolic Panel:  Recent Labs  10/02/16 0309 10/03/16 0101  NA 137 140  K 4.1 4.5  CL 104 108  CO2 25 27  GLUCOSE 160* 130*  BUN 25* 22*  CREATININE 0.90 0.88  CALCIUM 8.7* 8.2*   Liver Function Tests: No results for input(s): AST, ALT, ALKPHOS, BILITOT, PROT, ALBUMIN in the last 72 hours. No results for input(s): LIPASE, AMYLASE in the last 72 hours. CBC:  Recent Labs  10/03/16 0101 10/04/16 0305  WBC 18.6* 12.8*  HGB 15.1 14.7  HCT 44.8 43.9  MCV 93.8 95.1  PLT 133* 118*   Cardiac Enzymes:  Recent Labs  10/01/16 1136 10/01/16 1754 10/02/16 0309  TROPONINI 18.13* 32.29* 24.35*   BNP: Invalid input(s): POCBNP D-Dimer: No results for input(s): DDIMER in the last 72 hours. Hemoglobin A1C: No results for input(s): HGBA1C in the last 72 hours. Fasting Lipid Panel: No results for input(s): CHOL, HDL, LDLCALC, TRIG, CHOLHDL, LDLDIRECT in the last 72 hours. Thyroid Function Tests: No results for input(s): TSH, T4TOTAL, T3FREE, THYROIDAB in the last 72 hours.  Invalid input(s): FREET3 Anemia Panel: No results for input(s): VITAMINB12, FOLATE, FERRITIN, TIBC, IRON, RETICCTPCT in the last 72 hours.   Physical Exam: Blood pressure 98/72, pulse (!) 105, temperature 97.7 F (36.5 C), temperature source Oral, resp. rate 18, height 5\' 10"  (1.778 m), weight 87.6 kg (193  lb 2 oz), SpO2 93 %.   Wt Readings from Last 1 Encounters:  10/01/16 87.6 kg (193 lb 2 oz)     General appearance: alert and cooperative Resp: rhonchi bibasilar Cardio: irregularly irregular rhythm GI: soft, non-tender; bowel sounds normal; no masses,  no organomegaly Extremities: ecchymosis and tenderness at radial cath site.  Neurologic: Grossly normal  TELEMETRY: Reviewed telemetry pt in afib with variable and occasionally rapid vr:  ASSESSMENT AND PLAN:  Active Problems:   Anaphylaxis-no further symptoms. Will stop decadron.    Angio-edema-etiology unclear. Will discontinue decadron.     Nonsustained ventricular tachycardia (HCC)-no evidence of ventricular tachycardia. On amiodarone iv load at present.     Acute respiratory failure (HCC)-stable but sob. Will try low dose lasix.     Airway obstruction   Macroglossia-possible thrush. Will add nystatin s/w    Non-STEMI (non-ST elevated myocardial infarction) (HCC)-s/p pci of proximal lad with poba of distal lcx. Continue with asa, plavix, atorvastatin.    Acute ST elevation myocardial infarction (STEMI) of anterolateral wall (HCC)-s/p pci of proximal lad.   afib-rate has been somewhat difficult to control. On iv heparin at present. Will conitnue. Had gi  bleed with long acting anticoagulation at present. Currentl with no evidence of bleeding on asa, plavix and heparin. Will need to discuss chronic anticoagulation at discharge.  Will continue with iv amiodarone for now with digoxin prn for sustained rates greater than 120    Teodoro Spray, MD, [facility] 10/04/2016 8:41 AM

## 2016-10-04 NOTE — Progress Notes (Signed)
Advanced Care Plan.  Purpose of Encounter: CODE STATUS. Parties in Attendance: The patient, RN and me. Patient's Decisional Capacity: yes. Medical Story: This is a 83 y.o.malewith a history of Atrial fibrillation, hypertension, asthma, GERD, osteoporosis, osteoarthritis and sleep apnea. He is treated for anaphylaxis and developed STEMI and new onset A. fib. He is on heparin drip and amiodarone drip. I discussed with the patient about his condition and CODE STATUS. He said he has a living will of DO NOT RESUSCITATE. But he wants full code for now. Goals of Care Determinations: Full code for now.  Plan:  Code Status: Full code Time spent discussing advance care planning: 17 minutes.

## 2016-10-04 NOTE — Progress Notes (Signed)
ANTICOAGULATION CONSULT NOTE  Pharmacy Consult for Heparin Indication: chest pain/ACS  STEMI s/p PCI  No Known Allergies  Patient Measurements: Height: 5\' 10"  (177.8 cm) Weight: 193 lb 2 oz (87.6 kg) IBW/kg (Calculated) : 73 Heparin Dosing Weight: 87.6 kg  Vital Signs: Temp: 98.3 F (36.8 C) (07/07 1931) Temp Source: Oral (07/07 1931) BP: 94/66 (07/07 1931) Pulse Rate: 109 (07/07 1931)  Labs:  Recent Labs  10/01/16 0488 10/01/16 1136 10/01/16 1754 10/02/16 0309  10/03/16 0101 10/03/16 0911 10/03/16 1853 10/04/16 0305  HGB 16.4  --   --  16.5  --  15.1  --   --  14.7  HCT 49.3  --   --  50.3  --  44.8  --   --  43.9  PLT 147*  --   --  147*  --  133*  --   --  118*  APTT 31  --   --   --   --   --   --   --   --   LABPROT 13.8  --   --   --   --   --   --   --   --   INR 1.06  --   --   --   --   --   --   --   --   HEPARINUNFRC  --   --   --   --   < > 0.79* 0.81* 0.44 0.39  CREATININE 1.10  --   --  0.90  --  0.88  --   --   --   TROPONINI 8.15* 18.13* 32.29* 24.35*  --   --   --   --   --   < > = values in this interval not displayed.  Estimated Creatinine Clearance: 73.4 mL/min (by C-G formula based on SCr of 0.88 mg/dL).   Medical History: Past Medical History:  Diagnosis Date  . Arthritis   . Asthma   . Atrial fibrillation ([place])   . Cancer ([place])   . GERD (gastroesophageal reflux disease)   . Glaucoma   . Gout   . Heart murmur   . Hypertension   . Neuropathy   . Osteoporosis   . Sleep apnea     Assessment: 83 y/o M admitted with anaphylaxis with subsequent acute coronary syndrome during hospitalization requiring PCI.   Goal of Therapy:  Heparin level 0.3-0.7 units/ml Monitor platelets by anticoagulation protocol: Yes   Plan:  No initial bolus.  Start heparin infusion at 1150 units/hr Check anti-Xa level in 8 hours and daily while on heparin Continue to monitor H&H and platelets   7/7 @ 0100 HL 0.79 supratherapeutic. Will decrease rate to  1050 units/hr and will recheck HL @ 0900. CBC trended down slightly but stable.   7/7 HL@ 0911= 0.81. Will decrease rate to 850 units/hr and recheck HL at 1900.  7/7 1853 HL therapeutic x 1. Continue current rate. Will recheck in 8 hours.  7/8 @ 0305 HL 0.39 therapeutic. Will continue current rate and will recheck HL w/ am labs.  Tobie Lords, PharmD, BCPS Clinical Pharmacist 10/04/2016

## 2016-10-04 NOTE — Evaluation (Signed)
Physical Therapy Evaluation Patient Details Name: Donald Riley MRN: [id number] DOB: 07/18/1933 Today's Date: 10/04/2016   History of Present Illness  83 yo Male presents with known history of Atrial fibrillation, hypertension, asthma, GERD, osteoporosis, osteoarthritis and sleep apnea presents to the emergency department for evaluation of allergic reaction. While in hospital he had elevated troponins and was diagnosed with STEMI s/p heart cath.   Clinical Impression  83 yo Male s/p STEMI, heart cath presents with increased weakness and decreased mobility; Prior to admittance he was mod I in self care ADLs and used a SPC for ambulation. Patient does have co-morbidities that affect mobility including OA in spine and peripheral neuropathy. Patient exhibits decreased light touch sensation in right foot. He requires supervision for bed mobility. He requires min A for sit<>stand transfer using SPC and min A with gait x10 feet with SPC and 1 HHA. Patient ambulated with short step length and decreased gait speed. He states that he felt unsteady with SPC and would like to try RW with next gait trial. Patient was on 2L O2 while in bed with SPO2 being 96%. Following gait while on room air, SPO2 dropped to 81%, HR 79. Patient denies any shortness of breath. RN was notified and O2 was resumed for better O2 support. Patient would benefit from skilled PT intervention to improve strength, balance and gait safety. He would benefit from home health PT for safety once discharged and may need RW for gait at home to reduce fall risk;     Follow Up Recommendations Home health PT    Equipment Recommendations  Rolling walker with 5" wheels    Recommendations for Other Services Rehab consult     Precautions / Restrictions Precautions Precautions: Fall Restrictions Weight Bearing Restrictions: No      Mobility  Bed Mobility Overal bed mobility: Needs Assistance Bed Mobility: Supine to Sit     Supine to sit:  Supervision     General bed mobility comments: with bed rail and elevated head of bed; requires cues for hand placement and positioning;   Transfers Overall transfer level: Needs assistance Equipment used: Straight cane Transfers: Sit to/from Stand Sit to Stand: Min assist         General transfer comment: with cues for weight shift and hand/cane placement;   Ambulation/Gait Ambulation/Gait assistance: Min assist Ambulation Distance (Feet): 10 Feet Assistive device: Straight cane Gait Pattern/deviations: Step-through pattern;Decreased step length - right;Decreased step length - left;Decreased dorsiflexion - right;Decreased dorsiflexion - left;Wide base of support;Trunk flexed Gait velocity: decreased   General Gait Details: decreased gait speed; would hold onto furniture with hand not using SPC; required increased time with gait. Denies any shortness of breath; SPo2 following ambulation was 81% while on room air; Pulse ox did require increased time to determine vitals so concerned about circulation limiting accuracy of SPO2;   Stairs            Wheelchair Mobility    Modified Rankin (Stroke Patients Only)       Balance Overall balance assessment: Needs assistance Sitting-balance support: No upper extremity supported Sitting balance-Leahy Scale: Fair Sitting balance - Comments: static balance is normal, dynamic balance is fair with slight loss of balance following pertubations;    Standing balance support: Bilateral upper extremity supported Standing balance-Leahy Scale: Fair Standing balance comment: requires min A for steady with 2 HHA  Pertinent Vitals/Pain Pain Assessment: No/denies pain    Home Living Family/patient expects to be discharged to:: Private residence Living Arrangements: Spouse/significant other Available Help at Discharge: Family Type of Home: House Home Access: Level entry     [place] - single point;Shower seat - built in;Other (comment) (knee scooter)      Prior Function Level of Independence: Independent with assistive device(s)         Comments: reports being mod I for self care ADLs; would use cane for ambulation for safety;      Hand Dominance        Extremity/Trunk Assessment   Upper Extremity Assessment Upper Extremity Assessment: Overall WFL for tasks assessed    Lower Extremity Assessment Lower Extremity Assessment: RLE deficits/detail;LLE deficits/detail RLE Deficits / Details: 4-/5 grossly; decreased light touch sensation in feet; intact proprioception;  LLE Deficits / Details: 4-/5 grossly; decreased light touch sensation in feet; intact proprioception;     Cervical / Trunk Assessment Cervical / Trunk Assessment: Kyphotic  Communication   Communication: No difficulties  Cognition Arousal/Alertness: Awake/alert Behavior During Therapy: WFL for tasks assessed/performed Overall Cognitive Status: Within Functional Limits for tasks assessed                                        General Comments      Exercises Other Exercises Other Exercises: Instructed patient in ankle pump and LAQ while sitting in chair x3 min   Assessment/Plan    PT Assessment Patient needs continued PT services  PT Problem List Decreased strength;Decreased mobility;Decreased safety awareness;Decreased activity tolerance;Cardiopulmonary status limiting activity;Decreased balance;Impaired sensation       PT Treatment Interventions Gait training;Functional mobility training;Neuromuscular re-education;Balance training;Therapeutic exercise;Therapeutic activities;Stair training;Patient/family education    PT Goals (Current goals can be found in the Care Plan section)  Acute Rehab PT Goals Patient Stated Goal: "I want to go home."  PT Goal Formulation: With patient Time For Goal Achievement: 10/18/16 Potential to  Achieve Goals: Good    Frequency Min 2X/week   Barriers to discharge   none    Co-evaluation               AM-PAC PT "6 Clicks" Daily Activity  Outcome Measure Difficulty turning over in bed (including adjusting bedclothes, sheets and blankets)?: A Little Difficulty moving from lying on back to sitting on the side of the bed? : A Lot Difficulty sitting down on and standing up from a chair with arms (e.g., wheelchair, bedside commode, etc,.)?: Total Help needed moving to and from a bed to chair (including a wheelchair)?: A Little Help needed walking in hospital room?: A Little Help needed climbing 3-5 steps with a railing? : A Lot 6 Click Score: 14    End of Session Equipment Utilized During Treatment: Gait belt Activity Tolerance: Patient tolerated treatment well;No increased pain Patient left: in chair;with call bell/phone within reach;with family/visitor present;with chair alarm set Nurse Communication: Mobility status PT Visit Diagnosis: Unsteadiness on feet (R26.81);Muscle weakness (generalized) (M62.81)    Time: 1941-7408 PT Time Calculation (min) (ACUTE ONLY): 26 min   Charges:   PT Evaluation $PT Eval Low Complexity: 1 Procedure     PT G Codes:        Trotter,Margaret PT, DPT 10/04/2016, 12:11 PM

## 2016-10-05 ENCOUNTER — Inpatient Hospital Stay: Payer: Medicare Other

## 2016-10-05 LAB — HEPARIN LEVEL (UNFRACTIONATED): Heparin Unfractionated: 0.31 IU/mL (ref 0.30–0.70)

## 2016-10-05 LAB — BASIC METABOLIC PANEL
Anion gap: 5 (ref 5–15)
BUN: 22 mg/dL — ABNORMAL HIGH (ref 6–20)
CO2: 26 mmol/L (ref 22–32)
Calcium: 7.7 mg/dL — ABNORMAL LOW (ref 8.9–10.3)
Chloride: 106 mmol/L (ref 101–111)
Creatinine, Ser: 0.74 mg/dL (ref 0.61–1.24)
GFR calc Af Amer: >60 mL/min (ref >60)
GFR calc non Af Amer: >60 mL/min (ref >60)
Glucose, Bld: 116 mg/dL — ABNORMAL HIGH (ref 65–99)
Potassium: 3.8 mmol/L (ref 3.5–5.1)
Sodium: 137 mmol/L (ref 135–145)

## 2016-10-05 LAB — GLUCOSE, CAPILLARY
Glucose-Capillary: 109 mg/dL — ABNORMAL HIGH (ref 65–99)
Glucose-Capillary: 117 mg/dL — ABNORMAL HIGH (ref 65–99)
Glucose-Capillary: 102 mg/dL — ABNORMAL HIGH (ref 65–99)
Glucose-Capillary: 111 mg/dL — ABNORMAL HIGH (ref 65–99)
Glucose-Capillary: 146 mg/dL — ABNORMAL HIGH (ref 65–99)
Glucose-Capillary: 129 mg/dL — ABNORMAL HIGH (ref 65–99)

## 2016-10-05 MED ORDER — MAGIC MOUTHWASH
5.0000 mL | Freq: Three times a day (TID) | ORAL | Status: DC | PRN
  Filled 2016-10-05: qty 5

## 2016-10-05 MED ORDER — AMIODARONE HCL 200 MG PO TABS
400.0000 mg | ORAL_TABLET | Freq: Two times a day (BID) | ORAL | Status: DC
  Administered 2016-10-05 – 2016-10-06 (×3): 400 mg via ORAL
  Filled 2016-10-05 (×4): qty 2

## 2016-10-05 MED ORDER — MAGIC MOUTHWASH W/LIDOCAINE
5.0000 mL | Freq: Three times a day (TID) | ORAL | Status: DC | PRN
Start: 2016-10-05 — End: 2016-10-05
  Filled 2016-10-05: qty 5

## 2016-10-05 MED ORDER — TRAMADOL HCL 50 MG PO TABS
50.0000 mg | ORAL_TABLET | Freq: Four times a day (QID) | ORAL | Status: DC | PRN
  Administered 2016-10-05 – 2016-10-06 (×2): 50 mg via ORAL
  Filled 2016-10-05 (×2): qty 1

## 2016-10-05 MED ORDER — LIDOCAINE VISCOUS 2 % MT SOLN
5.0000 mL | Freq: Three times a day (TID) | OROMUCOSAL | Status: DC | PRN
  Administered 2016-10-05: 5 mL via OROMUCOSAL
  Filled 2016-10-05 (×2): qty 5

## 2016-10-05 NOTE — Progress Notes (Signed)
Physical Therapy Treatment Patient Details Name: Donald Riley MRN: [id number] DOB: 1934/02/06 Today's Date: 10/05/2016    History of Present Illness 83 yo Male presents with known history of Atrial fibrillation, hypertension, asthma, GERD, osteoporosis, osteoarthritis and sleep apnea presents to the emergency department for evaluation of allergic reaction. While in hospital he had elevated troponins and was diagnosed with STEMI s/p heart cath.     PT Comments    Pt demonstrates significant improvement in ambulation with therapist on this date. Pt continues to take short shuffling steps with rolling walker. Utilized platform for RUE due to R radial cath with significant bruising in R forearm. Pt able to ambulate a full lap around RN station. SaO2 at or above 95% on room air throughout. HR remains in the 80's bpm. Pt reports mild DOE but no obvious signs of respiratory distress. Two standing rest breaks to assess SaO2. Pt reports significant improvement in his confidence and would like to discharge home with [facility]. Daughter is in agreement. Recommend he use rolling walker at discharge. If available he would benefit from RUE platform due to radial artery heart catheterization with significant R forearm bruising. Pt will benefit from PT services to address deficits in strength, balance, and mobility in order to return to full function at home.    Follow Up Recommendations  Home health PT     Equipment Recommendations  Rolling walker with 5" wheels;Other (comment) (RUE platform due to R radial cath with significant bruising)    Recommendations for Other Services       Precautions / Restrictions Precautions Precautions: Fall Restrictions Weight Bearing Restrictions: No    Mobility  Bed Mobility               General bed mobility comments: Received and left upright in recliner. Not assessed at this time  Transfers Overall transfer level: Needs assistance Equipment used: Rolling  walker (2 wheeled) (Platform RUE) Transfers: Sit to/from Stand Sit to Stand: Min guard         General transfer comment: Pt demonstrates good stability with transfers. Requires increased time due to LE weakness but no external assist  Ambulation/Gait Ambulation/Gait assistance: Min guard Ambulation Distance (Feet): 200 Feet Assistive device: Rolling walker (2 wheeled) (platform for RUE) Gait Pattern/deviations: Step-through pattern;Decreased step length - right;Decreased step length - left;Decreased dorsiflexion - right;Decreased dorsiflexion - left;Wide base of support;Trunk flexed Gait velocity: decreased Gait velocity interpretation: <1.8 ft/sec, indicative of risk for recurrent falls General Gait Details: Pt takes short shuffling steps with rolling walker. Utilized platform for RUE due to R radial cath with significant bruising in R forearm. Pt able to ambulate a full lap around RN station. SaO2 at or above 95% on room air throughout. HR remains in the 80's bpm. Pt reports mild DOE but no obvious signs of respiratory distress. Two standing rest breaks to assess SaO2.   Stairs            Wheelchair Mobility    Modified Rankin (Stroke Patients Only)       Balance Overall balance assessment: Needs assistance Sitting-balance support: No upper extremity supported Sitting balance-Leahy Scale: Fair     Standing balance support: Bilateral upper extremity supported Standing balance-Leahy Scale: Fair Standing balance comment: Able to remain in standing with UE support on rolling walker and no external assist  Cognition Arousal/Alertness: Awake/alert Behavior During Therapy: WFL for tasks assessed/performed Overall Cognitive Status: Within Functional Limits for tasks assessed                                        Exercises      General Comments        Pertinent Vitals/Pain Pain Assessment: No/denies pain     Home Living                      Prior Function            PT Goals (current goals can now be found in the care plan section) Acute Rehab PT Goals Patient Stated Goal: "I want to go home."  PT Goal Formulation: With patient Time For Goal Achievement: 10/18/16 Potential to Achieve Goals: Good Progress towards PT goals: Progressing toward goals    Frequency    Min 2X/week      PT Plan Current plan remains appropriate    Co-evaluation              AM-PAC PT "6 Clicks" Daily Activity  Outcome Measure  Difficulty turning over in bed (including adjusting bedclothes, sheets and blankets)?: A Little Difficulty moving from lying on back to sitting on the side of the bed? : A Lot Difficulty sitting down on and standing up from a chair with arms (e.g., wheelchair, bedside commode, etc,.)?: A Little Help needed moving to and from a bed to chair (including a wheelchair)?: A Little Help needed walking in hospital room?: A Little Help needed climbing 3-5 steps with a railing? : A Lot 6 Click Score: 16    End of Session Equipment Utilized During Treatment: Gait belt Activity Tolerance: Patient tolerated treatment well;No increased pain Patient left: in chair;with call bell/phone within reach;with family/visitor present;with chair alarm set;Other (comment) (On room air, RN notified) Nurse Communication: Mobility status;Other (comment) (SaO2 readings, left on room air) PT Visit Diagnosis: Unsteadiness on feet (R26.81);Muscle weakness (generalized) (M62.81)     Time: 7322-0254 PT Time Calculation (min) (ACUTE ONLY): 30 min  Charges:  $Gait Training: 23-37 mins                    G Codes:      Lyndel Safe Huprich PT, DPT     Huprich,Jason 10/05/2016, 4:53 PM

## 2016-10-05 NOTE — Progress Notes (Addendum)
[facility] [facility] [place] NAME: Donald Riley    MR#:  [id number]  DATE OF BIRTH:  03/03/1934  SUBJECTIVE:  CHIEF COMPLAINT:   Chief Complaint  Patient presents with  . Respiratory Distress   No chest pain, palpitation, mild SOB, on O2 [place] 2L HR is controlled with amiodarone drip. REVIEW OF SYSTEMS:  Review of Systems  Constitutional: Positive for malaise/fatigue. Negative for chills and fever.  HENT: Negative for sore throat.   Eyes: Negative for blurred vision and double vision.  Respiratory: Positive for shortness of breath. Negative for cough, wheezing and stridor.   Cardiovascular: Negative for chest pain, palpitations and leg swelling.  Gastrointestinal: Negative for abdominal pain, blood in stool, diarrhea, melena, nausea and vomiting.  Genitourinary: Negative for dysuria, flank pain and hematuria.  Musculoskeletal: Negative for back pain.  Neurological: Negative for dizziness, focal weakness, loss of consciousness, weakness and headaches.  Psychiatric/Behavioral: Negative for depression. The patient is not nervous/anxious.     DRUG ALLERGIES:  No Known Allergies VITALS:  Blood pressure 100/63, pulse 77, temperature (!) 97.4 F (36.3 C), temperature source Oral, resp. rate 20, height 5\' 10"  (1.778 m), weight 193 lb 2 oz (87.6 kg), SpO2 97 %. PHYSICAL EXAMINATION:  Physical Exam  Constitutional: He is oriented to person, place, and time and well-developed, well-nourished, and in no distress.  HENT:  Head: Normocephalic.  Mouth/Throat: Oropharynx is clear and moist.  Eyes: Conjunctivae and EOM are normal.  Neck: Normal range of motion. Neck supple. No JVD present. No tracheal deviation present.  Cardiovascular: Normal rate and regular rhythm.   Murmur heard. Pulmonary/Chest: Effort normal. No respiratory distress. He has no wheezes. He has no rales.  DIMINISHED LUNG SOUNDS  Abdominal: Soft. Bowel sounds are normal. He exhibits no  distension. There is no tenderness.  Musculoskeletal: Normal range of motion. He exhibits no edema or tenderness.  Neurological: He is alert and oriented to person, place, and time. No cranial nerve deficit.  Skin: No rash noted. No erythema.  Psychiatric: Affect normal.   LABORATORY PANEL:  Male CBC  Recent Labs Lab 10/04/16 0305  WBC 12.8*  HGB 14.7  HCT 43.9  PLT 118*   ------------------------------------------------------------------------------------------------------------------ Chemistries   Recent Labs Lab 10/01/16 0623  10/04/16 0305 10/05/16 0559  NA 136  < > 136 137  K 4.8  < > 4.3 3.8  CL 106  < > 106 106  CO2 25  < > 25 26  GLUCOSE 214*  < > 141* 116*  BUN 27*  < > 21* 22*  CREATININE 1.10  < > 0.82 0.74  CALCIUM 8.4*  < > 7.8* 7.7*  MG  --   --  2.2  --   AST 102*  --   --   --   ALT 32  --   --   --   ALKPHOS 80  --   --   --   BILITOT 0.7  --   --   --   < > = values in this interval not displayed. RADIOLOGY:  Dg Chest Port 1 View  Result Date: 10/05/2016 CLINICAL DATA:  Anaphylaxis.  Acute coronary syndrome. EXAM: PORTABLE CHEST 1 VIEW COMPARISON:  10/02/2016 FINDINGS: 1233 hours. The cardio pericardial silhouette is enlarged. There is pulmonary vascular congestion without overt pulmonary edema. No overt airspace pulmonary edema or focal lung consolidation. No substantial pleural effusion. The visualized bony structures of the thorax are intact. Telemetry leads overlie  the chest. IMPRESSION: Cardiomegaly with vascular congestion. Electronically Signed   By: Donald Stanley M.D.   On: 10/05/2016 12:43   ASSESSMENT AND PLAN:   This is a 83 y.o. male with a history of Atrial fibrillation, hypertension, asthma, GERD, osteoporosis, osteoarthritis and sleep apnea  now being admitted with:  #. Anaphylaxis, Macroglossia - suspected angioedema. Treated with Decadron, Pepcid, Benadryl, improved.  Acute respiratory failure with hypoxia Status post extubation.   On oxygen [place]. NEB prn.  #. Acute ST elevation myocardial infarction (STEMI) of anterolateral wall. Elevated troponin, S/p PCI, continue aspirin, Plavix, lipitor, discontiued heparin drip by Dr. Ubaldo Riley. Echo: EF 40-45%. Mild aortic stenosis.  New-onset A. Fib. Discontinued amiodarone drip and heparin drip by Dr. Ubaldo Riley.  Changed to po amiodarone.  Dehydration. discontinued normal saline IV due to dyspnea.  Nonsustained ventricular tachycardia (HCC) on amiodarone.  #. No pneumonia Discontinued IV Zosyn and Vanco  #. History of atrial fibrillation Not on anticoagulation due to history of GI bleeding.  #. History of GERD On protonix.  Generalized weakness. PT evaluation suggested home health and PT, but the patient may need SNF placement. All the records are reviewed and case discussed with Care CM. Management plans discussed with the patient, His wife and they are in agreement.  CODE STATUS: Full Code  TOTAL TIME TAKING CARE OF THIS PATIENT: 42 minutes.   More than 50% of the time was spent in counseling/coordination of care: YES  POSSIBLE D/C IN 2 DAYS, DEPENDING ON CLINICAL CONDITION.   Donald Riley M.D on 10/05/2016 at 1:22 PM  Between 7am to 6pm - Pager - [phone]  After 6pm go to [email or web address] - Proofreader  [facility]  Office  [phone]  CC: Primary care physician; Donald Pilar, MD  Note: This dictation was prepared with Dragon dictation along with smaller phrase technology. Any transcriptional errors that result from this process are unintentional.

## 2016-10-05 NOTE — Progress Notes (Signed)
[facility]  SUBJECTIVE: Feels somewhat fatigued and has a sore mouth   Vitals:   10/04/16 2330 10/05/16 0433 10/05/16 0912 10/05/16 1212  BP:  103/77 111/68 100/63  Pulse: 71 64 71 77  Resp:  18 18 20   Temp:  97.6 F (36.4 C) 98.4 F (36.9 C) (!) 97.4 F (36.3 C)  TempSrc:  Oral Oral Oral  SpO2: 95% 96% 97% 97%  Weight:      Height:        Intake/Output Summary (Last 24 hours) at 10/05/16 1556 Last data filed at 10/05/16 1316  Gross per 24 hour  Intake          4502.05 ml  Output             1300 ml  Net          3202.05 ml    LABS: Basic Metabolic Panel:  Recent Labs  10/04/16 0305 10/05/16 0559  NA 136 137  K 4.3 3.8  CL 106 106  CO2 25 26  GLUCOSE 141* 116*  BUN 21* 22*  CREATININE 0.82 0.74  CALCIUM 7.8* 7.7*  MG 2.2  --    Liver Function Tests: No results for input(s): AST, ALT, ALKPHOS, BILITOT, PROT, ALBUMIN in the last 72 hours. No results for input(s): LIPASE, AMYLASE in the last 72 hours. CBC:  Recent Labs  10/03/16 0101 10/04/16 0305  WBC 18.6* 12.8*  HGB 15.1 14.7  HCT 44.8 43.9  MCV 93.8 95.1  PLT 133* 118*   Cardiac Enzymes: No results for input(s): CKTOTAL, CKMB, CKMBINDEX, TROPONINI in the last 72 hours. BNP: Invalid input(s): POCBNP D-Dimer: No results for input(s): DDIMER in the last 72 hours. Hemoglobin A1C: No results for input(s): HGBA1C in the last 72 hours. Fasting Lipid Panel: No results for input(s): CHOL, HDL, LDLCALC, TRIG, CHOLHDL, LDLDIRECT in the last 72 hours. Thyroid Function Tests: No results for input(s): TSH, T4TOTAL, T3FREE, THYROIDAB in the last 72 hours.  Invalid input(s): FREET3 Anemia Panel: No results for input(s): VITAMINB12, FOLATE, FERRITIN, TIBC, IRON, RETICCTPCT in the last 72 hours.   Physical Exam: Blood pressure 100/63, pulse 77, temperature (!) 97.4 F (36.3 C), temperature source Oral, resp. rate 20, height 5\' 10"  (1.778 m), weight  87.6 kg (193 lb 2 oz), SpO2 97 %.   Wt Readings from Last 1 Encounters:  10/01/16 87.6 kg (193 lb 2 oz)     General appearance: alert and cooperative Throat: abnormal findings: thrush Resp: clear to auscultation bilaterally Cardio: regular rate and rhythm GI: soft, non-tender; bowel sounds normal; no masses,  no organomegaly Extremities: extremities normal, atraumatic, no cyanosis or edema Neurologic: Grossly normal  TELEMETRY: Reviewed telemetry pt in nsr:  ASSESSMENT AND PLAN:  Active Problems:   Anaphylaxis-stable   Angio-edema   Nonsustained ventricular tachycardia (HCC)-no further ventricular arrhythmia   Acute respiratory failure (HCC)-cxr showed no airspace disease. Mild vlume overload. Has been taken off of iv fluids. Continue with po lasix.    Airway obstruction   Macroglossia   Non-STEMI (non-ST elevated myocardial infarction) (HCC)-s/p pci of lad. Continue with asa and plavix.    Acute ST elevation myocardial infarction (STEMI) of anterolateral wall (HCC)-as per above  Afib-converted to nsr with amiodarone. Will convert to po at 400 bid for now and to 400 daily at discharge. Will discontinue heparin and continue with asa and plavix.   thrush-nystatin ineffective. Will try magic mouthwash with lidocaine.  Teodoro Spray, MD, [facility] 10/05/2016 3:56 PM

## 2016-10-05 NOTE — Clinical Social Work Note (Signed)
CSW received referral for SNF.  Case discussed with case manager and plan is to discharge home with home health.  CSW to sign off please re-consult if social work needs arise.  Starling Jessie R. Shella Lahman, MSW, LCSWA [phone]  

## 2016-10-05 NOTE — Progress Notes (Signed)
ANTICOAGULATION CONSULT NOTE  Pharmacy Consult for Heparin Indication: chest pain/ACS  STEMI s/p PCI  No Known Allergies  Patient Measurements: Height: 5\' 10"  (177.8 cm) Weight: 193 lb 2 oz (87.6 kg) IBW/kg (Calculated) : 73 Heparin Dosing Weight: 87.6 kg  Vital Signs: Temp: 97.6 F (36.4 C) (07/09 0433) Temp Source: Oral (07/09 0433) BP: 103/77 (07/09 0433) Pulse Rate: 64 (07/09 0433)  Labs:  Recent Labs  10/03/16 0101  10/03/16 1853 10/04/16 0305 10/05/16 0559  HGB 15.1  --   --  14.7  --   HCT 44.8  --   --  43.9  --   PLT 133*  --   --  118*  --   HEPARINUNFRC 0.79*  < > 0.44 0.39 0.31  CREATININE 0.88  --   --  0.82 0.74  < > = values in this interval not displayed.  Estimated Creatinine Clearance: 80.7 mL/min (by C-G formula based on SCr of 0.74 mg/dL).   Medical History: Past Medical History:  Diagnosis Date  . Arthritis   . Asthma   . Atrial fibrillation ([place])   . Cancer ([place])   . GERD (gastroesophageal reflux disease)   . Glaucoma   . Gout   . Heart murmur   . Hypertension   . Neuropathy   . Osteoporosis   . Sleep apnea     Assessment: 83 y/o M admitted with anaphylaxis with subsequent acute coronary syndrome during hospitalization requiring PCI.   Goal of Therapy:  Heparin level 0.3-0.7 units/ml Monitor platelets by anticoagulation protocol: Yes   Plan:  No initial bolus.  Start heparin infusion at 1150 units/hr Check anti-Xa level in 8 hours and daily while on heparin Continue to monitor H&H and platelets   7/7 @ 0100 HL 0.79 supratherapeutic. Will decrease rate to 1050 units/hr and will recheck HL @ 0900. CBC trended down slightly but stable.   7/7 HL@ 0911= 0.81. Will decrease rate to 850 units/hr and recheck HL at 1900.  7/7 1853 HL therapeutic x 1. Continue current rate. Will recheck in 8 hours.  7/8 @ 0305 HL 0.39 therapeutic. Will continue current rate and will recheck HL w/ am labs.  7/9 @ 0600 HL 0.31 therapeutic. Will  continue current rate and will recheck HL w/ am labs.  Tobie Lords, PharmD, BCPS Clinical Pharmacist 10/05/2016

## 2016-10-05 NOTE — Progress Notes (Signed)
Mirapex given for restless leg.

## 2016-10-05 NOTE — Care Management (Addendum)
Patient presents from home.  Lives with his wife and is a retired Engineer, drilling.  Prior to the episode of illness patient was able to perform his own adls.  Patient has been left with profound debility and weakness from his recent illness.  He/8 with physical therapy.  dropped his oxygen sats to 81% on room air with exertion on 7/8.  Patient and his wife feel patient would benefit from skilled nursing placement.  Reaching out to physical therapy to discuss discharge disposition .  Updated CSW

## 2016-10-06 LAB — GLUCOSE, CAPILLARY
Glucose-Capillary: 96 mg/dL (ref 65–99)
Glucose-Capillary: 90 mg/dL (ref 65–99)
Glucose-Capillary: 107 mg/dL — ABNORMAL HIGH (ref 65–99)

## 2016-10-06 MED ORDER — FERROUS SULFATE 325 (65 FE) MG PO TABS
325.0000 mg | ORAL_TABLET | Freq: Every day | ORAL | Status: DC
  Administered 2016-10-06: 325 mg via ORAL
  Filled 2016-10-06: qty 1

## 2016-10-06 MED ORDER — EPINEPHRINE 0.3 MG/0.3ML IJ SOAJ: 0.3000 mg | Freq: Once | INTRAMUSCULAR | 0 refills | Status: AC | PRN

## 2016-10-06 MED ORDER — EPINEPHRINE 0.3 MG/0.3ML IJ SOAJ
0.3000 mg | Freq: Once | INTRAMUSCULAR | Status: DC
  Filled 2016-10-06: qty 0.3

## 2016-10-06 MED ORDER — FERROUS SULFATE 325 (65 FE) MG PO TABS: 325.0000 mg | ORAL_TABLET | Freq: Every day | ORAL | 2 refills | Status: DC

## 2016-10-06 MED ORDER — AMIODARONE HCL 400 MG PO TABS: 400.0000 mg | ORAL_TABLET | Freq: Two times a day (BID) | ORAL | 0 refills | Status: DC

## 2016-10-06 MED ORDER — ASPIRIN 81 MG PO CHEW: 81.0000 mg | CHEWABLE_TABLET | Freq: Every day | ORAL | 2 refills | Status: AC

## 2016-10-06 MED ORDER — OXYCODONE-ACETAMINOPHEN 5-325 MG PO TABS: 1.0000 | ORAL_TABLET | Freq: Four times a day (QID) | ORAL | 0 refills | Status: DC | PRN

## 2016-10-06 MED ORDER — ATORVASTATIN CALCIUM 40 MG PO TABS: 40.0000 mg | ORAL_TABLET | Freq: Every day | ORAL | 2 refills | Status: DC

## 2016-10-06 MED ORDER — CLOPIDOGREL BISULFATE 75 MG PO TABS: 75.0000 mg | ORAL_TABLET | Freq: Every day | ORAL | 2 refills | Status: AC

## 2016-10-06 MED ORDER — FUROSEMIDE 20 MG PO TABS: 20.0000 mg | ORAL_TABLET | Freq: Every day | ORAL | 2 refills | Status: AC

## 2016-10-06 NOTE — Plan of Care (Signed)
Problem: Health Behavior/Discharge Planning: Goal: Ability to manage health-related needs will improve Outcome: Completed/Met Date Met: 10/06/16 Will go home with home services; pt, ot, rn, and aide.

## 2016-10-06 NOTE — Discharge Summary (Signed)
[name] NAME: Donald Riley    MR#:  [id number]  DATE OF BIRTH:  Aug 10, 1933  DATE OF ADMISSION:  09/30/2016   ADMITTING PHYSICIAN: Harvie Bridge, DO  DATE OF DISCHARGE: 10/06/2016 PRIMARY CARE PHYSICIAN: Hortencia Pilar, MD   ADMISSION DIAGNOSIS:  Nonsustained ventricular tachycardia ([place]) [I47.2] Anaphylaxis, initial encounter [T78.2XXA] Angioedema, initial encounter [T78.3XXA] DISCHARGE DIAGNOSIS:  Active Problems:   Anaphylaxis   Angio-edema   Nonsustained ventricular tachycardia (HCC)   Acute respiratory failure (HCC)   Airway obstruction   Macroglossia   Non-STEMI (non-ST elevated myocardial infarction) (HCC)   Acute ST elevation myocardial infarction (STEMI) of anterolateral wall (HCC)  SECONDARY DIAGNOSIS:   Past Medical History:  Diagnosis Date  . Arthritis   . Asthma   . Atrial fibrillation ([place])   . Cancer ([place])   . GERD (gastroesophageal reflux disease)   . Glaucoma   . Gout   . Heart murmur   . Hypertension   . Neuropathy   . Osteoporosis   . Sleep apnea    HOSPITAL COURSE:   This is a 83 y.o.malewith a history of Atrial fibrillation, hypertension, asthma, GERD, osteoporosis, osteoarthritis and sleep apneanow being admitted with:  #. Anaphylaxis, Macroglossia - suspected angioedema. Treated with Decadron, Pepcid, Benadryl, improved.  Acute respiratory failure with hypoxia Status post extubation.  Off oxygen . NEB prn.  #. Acute ST elevation myocardial infarction (STEMI) of anterolateral wall. Elevated troponin, S/p PCI, continue aspirin, Plavix, lipitor, discontiued heparin drip by Dr. Ubaldo Glassing. Echo: EF 40-45%. Mild aortic stenosis.  New-onset A. Fib. Discontinued amiodarone drip and heparin drip by Dr. Ubaldo Glassing.  Changed to po amiodarone.  Dehydration. discontinued normal saline IV due to dyspnea.  Nonsustained ventricular tachycardia (HCC) on amiodarone.  #. No  pneumonia Discontinued IV Zosyn and Vanco  #. History of atrial fibrillation Not on anticoagulation due to history of GI bleeding.  #. History of GERD On protonix.  Generalized weakness. PT evaluation suggested home health and PT. Discussed with Dr. Ubaldo Glassing. DISCHARGE CONDITIONS:  Stable, discharge to home with home health and PT today. CONSULTS OBTAINED:  Treatment Team:  Teodoro Spray, MD Wilhelmina Mcardle, MD DRUG ALLERGIES:  No Known Allergies DISCHARGE MEDICATIONS:   Allergies as of 10/06/2016   No Known Allergies     Medication List    STOP taking these medications   diltiazem 120 MG 24 hr capsule Commonly known as:  CARDIZEM CD   flecainide 100 MG tablet Commonly known as:  TAMBOCOR   meloxicam 15 MG tablet Commonly known as:  MOBIC     TAKE these medications   amiodarone 400 MG tablet Commonly known as:  PACERONE Take 1 tablet (400 mg total) by mouth 2 (two) times daily.   aspirin 81 MG chewable tablet Chew 1 tablet (81 mg total) by mouth daily.   atorvastatin 40 MG tablet Commonly known as:  LIPITOR Take 1 tablet (40 mg total) by mouth daily at 6 PM.   CALCIUM PLUS VITAMIN D3 600-500 MG-UNIT Caps Generic drug:  Calcium Carb-Cholecalciferol Take by mouth 2 (two) times daily. Notes to patient:  NONE GIVEN TODAY   clopidogrel 75 MG tablet Commonly known as:  PLAVIX Take 1 tablet (75 mg total) by mouth daily with breakfast. Start taking on:  10/07/2016   COLCRYS 0.6 MG tablet Generic drug:  colchicine Take 0.6 mg by mouth daily.   EPINEPHrine 0.3 mg/0.3 mL Soaj injection Commonly known as:  EPI-PEN  Inject 0.3 mLs (0.3 mg total) into the muscle once as needed.   ferrous sulfate 325 (65 FE) MG tablet Take 1 tablet (325 mg total) by mouth daily with breakfast.   furosemide 20 MG tablet Commonly known as:  LASIX Take 1 tablet (20 mg total) by mouth daily.   gabapentin 100 MG capsule Commonly known as:  NEURONTIN Take 100 mg by mouth 2 (two)  times daily.   GLUCOSAMINE-CHONDROITIN DS 500-400 MG tablet Generic drug:  glucosamine-chondroitin Take by mouth.   latanoprost 0.005 % ophthalmic solution Commonly known as:  XALATAN Place 1 drop into both eyes at bedtime.   oxyCODONE-acetaminophen 5-325 MG tablet Commonly known as:  PERCOCET/ROXICET Take 1 tablet by mouth every 6 (six) hours as needed for moderate pain or severe pain (pain).   pantoprazole 40 MG tablet Commonly known as:  PROTONIX Take 40 mg by mouth daily. Reported on 07/02/2015   pramipexole 0.25 MG tablet Commonly known as:  MIRAPEX Take 0.25-0.5 mg by mouth 2 (two) times daily.   SPIRIVA HANDIHALER 18 MCG inhalation capsule Generic drug:  tiotropium Place 18 mcg into inhaler and inhale daily.   VENTOLIN HFA 108 (90 Base) MCG/ACT inhaler Generic drug:  albuterol Inhale into the lungs every 4 (four) hours as needed.            Durable Medical Equipment        Start     Ordered   10/06/16 [id number]  For home use only DME Walker rolling  Once    Question:  Patient needs a walker to treat with the following condition  Answer:  Weakness   10/06/16 0857       DISCHARGE INSTRUCTIONS:  See AVS.  If you experience worsening of your admission symptoms, develop shortness of breath, life threatening emergency, suicidal or homicidal thoughts you must seek medical attention immediately by calling 911 or calling your MD immediately  if symptoms less severe.  You Must read complete instructions/literature along with all the possible adverse reactions/side effects for all the Medicines you take and that have been prescribed to you. Take any new Medicines after you have completely understood and accpet all the possible adverse reactions/side effects.   Please note  You were cared for by a hospitalist during your hospital stay. If you have any questions about your discharge medications or the care you received while you were in the hospital after you are  discharged, you can call the unit and asked to speak with the hospitalist on call if the hospitalist that took care of you is not available. Once you are discharged, your primary care physician will handle any further medical issues. Please note that NO REFILLS for any discharge medications will be authorized once you are discharged, as it is imperative that you return to your primary care physician (or establish a relationship with a primary care physician if you do not have one) for your aftercare needs so that they can reassess your need for medications and monitor your lab values.    On the day of Discharge:  VITAL SIGNS:  Blood pressure 103/69, pulse (!) 122, temperature 98.2 F (36.8 C), resp. rate 19, height 5\' 10"  (1.778 m), weight 193 lb 2 oz (87.6 kg), SpO2 96 %. PHYSICAL EXAMINATION:  GENERAL:  83 y.o.-year-old patient lying in the bed with no acute distress.  EYES: Pupils equal, round, reactive to light and accommodation. No scleral icterus. Extraocular muscles intact.  HEENT: Head atraumatic, normocephalic. Oropharynx and nasopharynx clear.  NECK:  Supple, no jugular venous distention. No thyroid enlargement, no tenderness.  LUNGS: Normal breath sounds bilaterally, no wheezing, rales,rhonchi or crepitation. No use of accessory muscles of respiration.  CARDIOVASCULAR: S1, S2 normal. No murmurs, rubs, or gallops.  ABDOMEN: Soft, non-tender, non-distended. Bowel sounds present. No organomegaly or mass.  EXTREMITIES: No pedal edema, cyanosis, or clubbing.  NEUROLOGIC: Cranial nerves II through XII are intact. Muscle strength 5/5 in all extremities. Sensation intact. Gait not checked.  PSYCHIATRIC: The patient is alert and oriented x 3.  SKIN: No obvious rash, lesion, or ulcer.  DATA REVIEW:   CBC  Recent Labs Lab 10/04/16 0305  WBC 12.8*  HGB 14.7  HCT 43.9  PLT 118*    Chemistries   Recent Labs Lab 10/01/16 0623  10/04/16 0305 10/05/16 0559  NA 136  < > 136 137  K  4.8  < > 4.3 3.8  CL 106  < > 106 106  CO2 25  < > 25 26  GLUCOSE 214*  < > 141* 116*  BUN 27*  < > 21* 22*  CREATININE 1.10  < > 0.82 0.74  CALCIUM 8.4*  < > 7.8* 7.7*  MG  --   --  2.2  --   AST 102*  --   --   --   ALT 32  --   --   --   ALKPHOS 80  --   --   --   BILITOT 0.7  --   --   --   < > = values in this interval not displayed.   Microbiology Results  Results for orders placed or performed during the hospital encounter of 09/30/16  MRSA PCR Screening     Status: None   Collection Time: 10/01/16  3:14 AM  Result Value Ref Range Status   MRSA by PCR NEGATIVE NEGATIVE Final    Comment:        The GeneXpert MRSA Assay (FDA approved for NASAL specimens only), is one component of a comprehensive MRSA colonization surveillance program. It is not intended to diagnose MRSA infection nor to guide or monitor treatment for MRSA infections.     RADIOLOGY:  No results found.   Management plans discussed with the patient, His wife and they are in agreement.  CODE STATUS: Full Code   TOTAL TIME TAKING CARE OF THIS PATIENT: 42 minutes.    Demetrios Loll M.D on 10/06/2016 at 2:09 PM  Between 7am to 6pm - Pager - [phone]  After 6pm go to [email or web address] - Proofreader  [facility]  Office  [phone]  CC: Primary care physician; Hortencia Pilar, MD   Note: This dictation was prepared with Dragon dictation along with smaller phrase technology. Any transcriptional errors that result from this process are unintentional.

## 2016-10-06 NOTE — Progress Notes (Signed)
Sitting up in chair. cpap at bedside available for use

## 2016-10-06 NOTE — Progress Notes (Signed)
[place]  SUBJECTIVE: feels a little better. Less sob.    Vitals:   10/05/16 1212 10/05/16 2004 10/06/16 0341 10/06/16 0735  BP: 100/63 93/64 107/72 (!) 88/70  Pulse: 77 70 (!) 118 (!) 120  Resp: 20  18 16   Temp: (!) 97.4 F (36.3 C) 98 F (36.7 C) 97.9 F (36.6 C) 97.8 F (36.6 C)  TempSrc: Oral Oral Oral Oral  SpO2: 97% 94% 94% 93%  Weight:      Height:        Intake/Output Summary (Last 24 hours) at 10/06/16 0818 Last data filed at 10/06/16 0751  Gross per 24 hour  Intake          4262.05 ml  Output              950 ml  Net          3312.05 ml    LABS: Basic Metabolic Panel:  Recent Labs  10/04/16 0305 10/05/16 0559  NA 136 137  K 4.3 3.8  CL 106 106  CO2 25 26  GLUCOSE 141* 116*  BUN 21* 22*  CREATININE 0.82 0.74  CALCIUM 7.8* 7.7*  MG 2.2  --    Liver Function Tests: No results for input(s): AST, ALT, ALKPHOS, BILITOT, PROT, ALBUMIN in the last 72 hours. No results for input(s): LIPASE, AMYLASE in the last 72 hours. CBC:  Recent Labs  10/04/16 0305  WBC 12.8*  HGB 14.7  HCT 43.9  MCV 95.1  PLT 118*   Cardiac Enzymes: No results for input(s): CKTOTAL, CKMB, CKMBINDEX, TROPONINI in the last 72 hours. BNP: Invalid input(s): POCBNP D-Dimer: No results for input(s): DDIMER in the last 72 hours. Hemoglobin A1C: No results for input(s): HGBA1C in the last 72 hours. Fasting Lipid Panel: No results for input(s): CHOL, HDL, LDLCALC, TRIG, CHOLHDL, LDLDIRECT in the last 72 hours. Thyroid Function Tests: No results for input(s): TSH, T4TOTAL, T3FREE, THYROIDAB in the last 72 hours.  Invalid input(s): FREET3 Anemia Panel: No results for input(s): VITAMINB12, FOLATE, FERRITIN, TIBC, IRON, RETICCTPCT in the last 72 hours.   Physical Exam: Blood pressure (!) 88/70, pulse (!) 120, temperature 97.8 F (36.6 C), temperature source Oral, resp. rate 16, height 5\' 10"  (1.778 m), weight 87.6 kg (193  lb 2 oz), SpO2 93 %.   Wt Readings from Last 1 Encounters:  10/01/16 87.6 kg (193 lb 2 oz)     General appearance: alert and cooperative Head: Normocephalic, without obvious abnormality, atraumatic Resp: clear to auscultation bilaterally Cardio: irregularly irregular rhythm GI: soft, non-tender; bowel sounds normal; no masses,  no organomegaly Extremities: extremities normal, atraumatic, no cyanosis or edema Neurologic: Grossly normal  TELEMETRY: Reviewed telemetry pt in afib with controlled rate  ASSESSMENT AND PLAN:  Active Problems:   Anaphylaxis   Angio-edema-no further problems. WIll need epi pen at discharge and out patient evaluation per Allergist.   Nonsustained ventricular tachycardia (HCC)-no further ventricular arrhythmia   Acute respiratory failure (HCC)-improved. Ambulating in hall   Airway obstruction   Macroglossia-continue magiic mouth wash   Non-STEMI (non-ST elevated myocardial infarction) (HCC)-s/p pci of lad. Discharge on asa/plavix.    Acute ST elevation myocardial infarction (STEMI) of anterolateral wall (HCC)  afib-on amiodarone at 400 mg bid. WIll contiue with this. Not candidate for chronic anticoagulation at present given history of gi bleed. Will conitnue with asa and plavix.   Pt appears ready for discharge with home health.. Will need  out patient allergist referral prefers one at the [facility] in [place] if available or [facility] in [place] or preferrably at [place] for ease of travel from his home.  Will need an Epi Pen prior to discharge.   Will need to resume his eye drops which are followed by [facility].  He will need a walker  [place]. He requests [place] where a Laurence Aly works?  Will need Slow Fe added to his regimen  Will need his cpap device optimized based on a recent sleep study.   Teodoro Spray, MD, [facility] 10/06/2016 8:18 AM

## 2016-10-06 NOTE — Care Management Important Message (Signed)
Important Message  Patient Details  Name: Donald Riley MRN: [id number] Date of Birth: Dec 07, 1933   Medicare Important Message Given:  Yes Signed IM notice given   Katrina Stack, RN 10/06/2016, 10:03 AM

## 2016-10-06 NOTE — Discharge Instructions (Signed)
Heart healthy diet. HHPT. Fall precaution. Follow-up allergist as outpatient. Will need Slow Fe added to his regimen Will need his cpap device optimized based on a recent sleep study.

## 2016-10-29 ENCOUNTER — Other Ambulatory Visit: Payer: Self-pay | Admitting: Medical Oncology

## 2016-10-29 ENCOUNTER — Ambulatory Visit
Admission: RE | Admit: 2016-10-29 | Discharge: 2016-10-29 | Disposition: A | Discharge status: Home / Self Care | Payer: Medicare Other | Source: Ambulatory Visit | Attending: Medical Oncology | Admitting: Medical Oncology

## 2016-10-29 DIAGNOSIS — I2510 Atherosclerotic heart disease of native coronary artery without angina pectoris: Secondary | ICD-10-CM | POA: Insufficient documentation

## 2016-10-29 DIAGNOSIS — I700 Atherosclerosis of aorta: Secondary | ICD-10-CM | POA: Diagnosis not present

## 2016-10-29 DIAGNOSIS — R0602 Shortness of breath: Secondary | ICD-10-CM | POA: Diagnosis not present

## 2016-10-29 MED ORDER — IOPAMIDOL (ISOVUE-370) INJECTION 76%
100.0000 mL | Freq: Once | INTRAVENOUS | Status: AC | PRN
  Administered 2016-10-29: 100 mL via INTRAVENOUS

## 2016-11-05 ENCOUNTER — Ambulatory Visit: Payer: Medicare Other | Admitting: Neurology

## 2016-11-19 ENCOUNTER — Ambulatory Visit (INDEPENDENT_AMBULATORY_CARE_PROVIDER_SITE_OTHER): Payer: Medicare Other | Admitting: Neurology

## 2016-11-19 ENCOUNTER — Encounter: Payer: Self-pay | Admitting: Neurology

## 2016-11-19 VITALS — BP 126/80 | HR 75 | Ht 69.0 in | Wt 185.0 lb

## 2016-11-19 DIAGNOSIS — G629 Polyneuropathy, unspecified: Secondary | ICD-10-CM

## 2016-11-19 DIAGNOSIS — H5340 Unspecified visual field defects: Secondary | ICD-10-CM

## 2016-11-19 NOTE — Patient Instructions (Signed)
Thank you for choosing [facility] for your neurological care! It was nice to meet you today! I appreciate that you entrust me with your healthcare concerns. I hope, I was able to address at least some of your concerns today.    Here is what we discussed today and what we came up with as our plan for you:   We will do a brain scan, called MRI for your visual field defect, and call you with the test results. We will have to schedule you for this on a separate date. This test requires authorization from your insurance, and we will take care of the insurance process.  You have evidence on exam and by prior history and work up of neuropathy, affecting both upper and lower extremities. He had EMG and nerve conduction testing for this about 3 years ago and results indicated severe polyneuropathy. We can certainly take another look at this with another repeat testing, but it may not really change our management or your outcome. Neuropathies do typically progress with time. Thankfully, at this moment pain is not a big player and I would recommend we do not embark on any invasive testing at this moment, such as nerve biopsy or spinal tap for spinal fluid testing. He recently had an allergic reaction to some medication and I would not recommend any new medication trial at this time. We will call you with your MRI results but at this time I would recommend as needed follow-up for your neuropathy .

## 2016-11-19 NOTE — Progress Notes (Signed)
Subjective:    Patient ID: Donald Riley is a 83 y.o. male.  HPI     Star Age, MD, PhD [facility] [address]O. [place], [place] [address]  Dear Dr. Hoy Morn,   I saw your patient, Donald Riley, upon your kind request in my neurologic clinic today for initial consultation of his history of neuropathy. The patient is unaccompanied today. As you know, Donald Riley is an 83 year old right-handed gentleman with a complex medical history of arthritis, asthma, A. fib, reflux disease, glaucoma, gout, hypertension, hyperlipidemia, coronary artery disease, aortic stenosis, history of GI bleed, obstructive sleep apnea, recent admission to the hospital on 09/30/2016 through 10/06/2016 for angioedema, anaphylaxis and nonsustained V. tach, who reports a prior diagnosis of neuropathy. He had seen Dr. Starleen Blue for this and also Dr. Erling Cruz. He had EMG and nerve conduction testing in the past for this. He tried gabapentin. I reviewed your office note from 09/21/2016. He saw Dr. Melrose Nakayama at [facility] in 2015. I reviewed his initial consult from 12/13/2013. EMG and nerve conduction testing with Dr. 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 He was seen in follow-up on 03/14/2014 and started on gabapentin at the time low-dose. He was also advised to continue physical therapy. He had recent blood work through your office which I reviewed: TSH and free T4 were normal on 11/18/2016, ferritin was 93.  He denies significant pain with his neuropathy. He had a C spine MRI on 04/22/15, which I reviewed: G1 anterolisthesis C6-7 aith multilevel DDD.  He had a brain MRI in 2011. He has a history of visual field defect. Per ophthalmologist he was told that this has increased in size, he was told that he may have had an occipital stroke. He has not  had sudden symptoms in that regard. He gets a visual field check on a regular basis. As far as his neuropathy, he has no significant pain at this time. Since his recent hospitalization he feels that his pain has actually improved. He has a history of neck pain particularly on the left side but this has also subsided to some degree. He has noticed some muscle wasting in his hands and twitching of his smaller hand muscles. He has not fallen, he uses a cane. He has no family history of neuropathy as he recalls. He has never had a spinal tap or muscle biopsy. He no longer is on gabapentin, as it was suspected in causing or contributing to his anaphylaxis.  His Past Medical History Is Significant For: Past Medical History:  Diagnosis Date  . Arthritis   . Asthma   . Atrial fibrillation ([place])   . Cancer ([place])   . GERD (gastroesophageal reflux disease)   . Glaucoma   . Gout   . Heart murmur   . Hypertension   . Neuropathy   . Osteoporosis   . Sleep apnea     His Past Surgical History Is Significant For: Past Surgical History:  Procedure Laterality Date  . ARTHROPLASTY Right    ankle  . BUNIONECTOMY    . CARDIAC CATHETERIZATION N/A 07/02/2015   Procedure: Right/Left Heart Cath and Coronary Angiography;  Surgeon: Teodoro Spray, MD;  Location: [facility];  Service: Cardiovascular;  Laterality: N/A;  . CORONARY/GRAFT ACUTE MI REVASCULARIZATION N/A 10/02/2016   Procedure: Coronary/Graft Acute MI Revascularization;  Surgeon: Kathlyn Sacramento  A, MD;  Location: [facility];  Service: Cardiovascular;  Laterality: N/A;  . JOINT REPLACEMENT    . ostatectomy      His Family History Is Significant For: History reviewed. No pertinent family history.  His Social History Is Significant For: Social History   Social History  . Marital status: Married    Spouse name: N/A  . Number of children: N/A  . Years of education: N/A   Social History Main Topics  . Smoking status: Never  Smoker  . Smokeless tobacco: Never Used  . Alcohol use No  . Drug use: No  . Sexual activity: Not Asked   Other Topics Concern  . None   Social History Narrative  . None    His Allergies Are:  Allergies  Allergen Reactions  . Gabapentin Other (See Comments)  . Celecoxib Other (See Comments)    Other reaction(s): OTHER Patient experienced increased blood pressure.  :   His Current Medications Are:  Outpatient Encounter Prescriptions as of 11/19/2016  Medication Sig  . albuterol (VENTOLIN HFA) 108 (90 BASE) MCG/ACT inhaler Inhale into the lungs every 4 (four) hours as needed.   Marland Kitchen amiodarone (PACERONE) 200 MG tablet   . aspirin 81 MG chewable tablet Chew 1 tablet (81 mg total) by mouth daily.  Marland Kitchen atorvastatin (LIPITOR) 40 MG tablet Take 1 tablet (40 mg total) by mouth daily at 6 PM.  . brimonidine (ALPHAGAN P) 0.1 % SOLN Alphagan P 0.1 % eye drops  . clopidogrel (PLAVIX) 75 MG tablet Take 1 tablet (75 mg total) by mouth daily with breakfast.  . diltiazem (CARDIZEM CD) 120 MG 24 hr capsule Take 120 mg by mouth.  . dorzolamide-timolol (COSOPT) 22.3-6.8 MG/ML ophthalmic solution   . EPINEPHrine 0.3 mg/0.3 mL IJ SOAJ injection Inject 0.3 mLs (0.3 mg total) into the muscle once as needed.  . ferrous sulfate 325 (65 FE) MG tablet Take 1 tablet (325 mg total) by mouth daily with breakfast.  . furosemide (LASIX) 20 MG tablet Take 1 tablet (20 mg total) by mouth daily.  Marland Kitchen latanoprost (XALATAN) 0.005 % ophthalmic solution Place 1 drop into both eyes at bedtime.   . metoprolol succinate (TOPROL-XL) 25 MG 24 hr tablet metoprolol succinate ER 25 mg tablet,extended release 24 hr  Take 1 tablet every day by oral route.  . mometasone-formoterol (DULERA) 200-5 MCG/ACT AERO Dulera 200 mcg-5 mcg/actuation HFA aerosol inhaler  . MULTIPLE VITAMIN PO Take by mouth.  . oxyCODONE-acetaminophen (PERCOCET/ROXICET) 5-325 MG tablet Take 1 tablet by mouth every 6 (six) hours as needed for moderate pain or  severe pain (pain).  . pantoprazole (PROTONIX) 40 MG tablet Take 40 mg by mouth daily. Reported on 07/02/2015  . pramipexole (MIRAPEX) 0.25 MG tablet Take 0.25-0.5 mg by mouth 2 (two) times daily.   . predniSONE (DELTASONE) 20 MG tablet   . tiotropium (SPIRIVA HANDIHALER) 18 MCG inhalation capsule Place 18 mcg into inhaler and inhale daily.   . [DISCONTINUED] albuterol (PROVENTIL HFA;VENTOLIN HFA) 108 (90 Base) MCG/ACT inhaler Inhale into the lungs.  . [DISCONTINUED] amiodarone (PACERONE) 400 MG tablet Take 1 tablet (400 mg total) by mouth 2 (two) times daily. (Patient not taking: Reported on 11/19/2016)  . [DISCONTINUED] amiodarone (PACERONE) 400 MG tablet Take by mouth.  . [DISCONTINUED] aspirin 81 MG chewable tablet Chew by mouth.  . [DISCONTINUED] atorvastatin (LIPITOR) 40 MG tablet Take by mouth.  . [DISCONTINUED] brimonidine (ALPHAGAN) 0.2 % ophthalmic solution   . [DISCONTINUED] Calcium Carb-Cholecalciferol (CALCIUM PLUS  VITAMIN D3) 600-500 MG-UNIT CAPS Take by mouth 2 (two) times daily.   . [DISCONTINUED] colchicine (COLCRYS) 0.6 MG tablet Take 0.6 mg by mouth daily.   . [DISCONTINUED] colchicine 0.6 MG tablet 2 tablets (1.2mg ) by mouth now and 1 tablet (0.6mg ) after 1 hour.  The next day, start 1 po daily.  . [DISCONTINUED] Ferrous Sulfate (SLOW FE) 142 (45 Fe) MG TBCR Take by mouth.  . [DISCONTINUED] flecainide (TAMBOCOR) 100 MG tablet Take by mouth.  . [DISCONTINUED] furosemide (LASIX) 20 MG tablet Take by mouth.  . [DISCONTINUED] gabapentin (NEURONTIN) 100 MG capsule Take 100 mg by mouth 2 (two) times daily.  . [DISCONTINUED] glucosamine-chondroitin (GLUCOSAMINE-CHONDROITIN DS) 500-400 MG tablet Take by mouth.  . [DISCONTINUED] Hydrocodone-Acetaminophen 5-300 MG TABS hydrocodone 5 mg-acetaminophen 300 mg tablet  . [DISCONTINUED] losartan (COZAAR) 100 MG tablet losartan 100 mg tablet  . [DISCONTINUED] meloxicam (MOBIC) 15 MG tablet   . [DISCONTINUED] mometasone (ASMANEX) 220 MCG/INH  inhaler Inhale into the lungs.  . [DISCONTINUED] pramipexole (MIRAPEX) 0.25 MG tablet Take 0.25 mg by mouth.  . [DISCONTINUED] rivaroxaban (XARELTO) 20 MG TABS tablet Xarelto 20 mg tablet  . [DISCONTINUED] tiotropium (SPIRIVA HANDIHALER) 18 MCG inhalation capsule Spiriva with HandiHaler 18 mcg and inhalation capsules  . [DISCONTINUED] traMADol (ULTRAM) 50 MG tablet 50 mg.  . [DISCONTINUED] traZODone (DESYREL) 50 MG tablet trazodone 50 mg tablet  . [DISCONTINUED] warfarin (COUMADIN) 1 MG tablet warfarin 1 mg tablet   No facility-administered encounter medications on file as of 11/19/2016.   : Review of Systems:  Out of a complete 14 point review of systems, all are reviewed and negative with the exception of these symptoms as listed below:  Review of Systems  HENT: Negative.   Eyes:       Loss of vision  Respiratory: Negative.   Cardiovascular:       Murmur palpitations  Gastrointestinal: Negative.   Endocrine: Negative.   Genitourinary:       Impotence  Musculoskeletal:       Joint pain  Neck pain Aching muscles  Allergic/Immunologic: Negative.   Neurological: Positive for weakness and numbness.       Restless leg syndrome sleepiness  Psychiatric/Behavioral:       Depression Anxiety PTSD    Objective:  Neurological Exam  Physical Exam Physical Examination:   Vitals:   11/19/16 1008  BP: 126/80  Pulse: 75   General Examination: The patient is a very pleasant 83 y.o. male in no acute distress. He appears well-developed and well-nourished and well groomed.   HEENT: Normocephalic, atraumatic, pupils are equal, round and reactive to light and accommodation. Extraocular tracking is fairly good. On visual field testing with finger. Metro a he seems to have a cut in his visual field in the left upper quadrant. No nystagmus noted. Normal smooth pursuit is noted. Hearing is grossly intact. Face is symmetric with normal facial animation and normal facial sensation. Speech is  clear with no dysarthria noted. There is no hypophonia. There is no lip, neck/head, jaw or voice tremor. Neck is supple with full range of passive and active motion. There are no carotid bruits on auscultation. Oropharynx exam reveals: mild mouth dryness, adequate dental hygiene. Mallampati is class I. Tongue protrudes centrally and palate elevates symmetrically.   Chest: Clear to auscultation without wheezing, rhonchi or crackles noted.  Heart: S1+S2+0, regular and normal without murmurs, rubs or gallops noted.   Abdomen: Soft, non-tender and non-distended with normal bowel sounds appreciated on auscultation.  Extremities:  There is trace pitting edema in the distal lower extremities bilaterally. Pedal pulses are intact.  Skin: Warm and dry without trophic changes noted.  Musculoskeletal: exam reveals no obvious joint deformities, tenderness or joint swelling or erythema.   Neurologically:  Mental status: The patient is awake, alert and oriented in all 4 spheres. His immediate and remote memory, attention, language skills and fund of knowledge are appropriate. There is no evidence of aphasia, agnosia, apraxia or anomia. Speech is clear with normal prosody and enunciation. Thought process is linear. Mood is normal and affect is normal.  Cranial nerves II - XII are as described above under HEENT exam. In addition: shoulder shrug is normal with equal shoulder height noted. Motor exam: Thin bulk, global strength of 4+ out of 5, weaker in the right hip flexor. Mild intrinsic hand muscle wasting in the left more than right hand, very rare fasciculation type changes in the L hypothenar eminence, no tremor. Romberg is not tested due to safety reasons. Reflexes are 1+ in the upper extremities, 1+ in the left knee, trace in the right knee, absent in both ankles. Toes are downgoing. Fine motor skills are mildly impaired globally.   Cerebellar testing: No dysmetria or intention tremor. There is no truncal or  gait ataxia.  Sensory exam: Shows decrease to all modalities in the upper and lower extremities distally, in the lower extremities up to mid shin areas, in the upper extremities up to mid forearm areas. Gait, station and balance: He stands up slowly, he stands slightly wide-based. He uses a cane on the right side and walks slowly. No significant steppage gait or foot drop noted. No shuffling. Assessment and Plan:    In summary, Donald Riley is a very pleasant 83 y.o.-year old male with a complex medical history of arthritis, asthma, A. fib, reflux disease, glaucoma, gout, hypertension, hyperlipidemia, coronary artery disease, aortic stenosis, history of GI bleed, obstructive sleep apnea, recent admission to the hospital on 09/30/2016 through 10/06/2016 for angioedema, anaphylaxis and nonsustained V. tach, whoPresents for reevaluation of his prior diagnosis of peripheral neuropathy. His history dates back several years ago. He had workup for this in the past under Dr. love, then at [facility] with Dr. Melrose Nakayama. His history and exam are in keeping with peripheral neuropathy. An etiology was not found in the past. I suggested that we can certainly look into the etiology of his peripheral neuropathy with further testing but would like to be very cautious with invasive testing such as nerve biopsy, or spinal fluid testing. Pain is not a big component at this time. He may have had a recent reaction to gabapentin and I would avoid this in the future. He is agreeable with watchful waiting at this time. He is supposed to see an allergy specialist soon. Certainly, we can think about repeating his EMG and nerve conduction test as well but it will likely not change the management. He does endorse some progression of his symptoms. He reports some neck pain but it actually has improved lately. He had a cervical spine MRI a year and a half ago which I reviewed. He reports worsening of his visual field defect, primarily as per his  ophthalmologist. I suggested we proceed with a brain MRI without contrast and compare with findings With his scan from 2011. We will call him with his test results and he is in regular follow-up with his ophthalmologist. From the neuropathy standpoint, we mutually agreed for an as needed follow-up at this point.  I answered all his questions today and he was in agreement.  Thank you very much for allowing me to participate in the care of this nice patient. If I can be of any further assistance to you please do not hesitate to call me at [phone].  Sincerely,   Star Age, MD, PhD

## 2016-12-03 ENCOUNTER — Ambulatory Visit
Admission: RE | Admit: 2016-12-03 | Discharge: 2016-12-03 | Disposition: A | Discharge status: Home / Self Care | Payer: Medicare Other | Source: Ambulatory Visit | Attending: Neurology | Admitting: Neurology

## 2016-12-03 DIAGNOSIS — G629 Polyneuropathy, unspecified: Secondary | ICD-10-CM

## 2016-12-03 DIAGNOSIS — H5340 Unspecified visual field defects: Secondary | ICD-10-CM | POA: Diagnosis not present

## 2016-12-04 NOTE — Progress Notes (Signed)
Please advise patient, that there are multiple findings on his brain MRI, none of which are new or acute:  1. He had an old bleed/hemorrhage in the brain, which was seen on his prior MRI in 2011 and has not changed in size, which is reassuring.  2. There was moderate volume loss which we call atrophy. This has become a little worse since the last scan 7 y ago, which is expected.  3. There were changes in the deeper structures of the brain, which we call white matter changes or microvascular changes. These were reported as moderate in His case. These are tiny white spots, that occur with time and are seen in a variety of conditions, including with normal aging, chronic hypertension, chronic headaches, especially migraine HAs, chronic diabetes, chronic hyperlipidemia. These changes have also progressed with time, which is expected.  4. 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 While there may not be anything to do, I would favor input from neurosurgery, as they may be able to verify that there are no treatments for this type of degenerative change. There is no actual spinal cord compression but the alignment of the very top of the spinal cord to the brainstem is changed some; again without obvious harm to the brainstem or spinal cord, which is reassuring. If he agrees, we can seek neurosurgical consultation for this.  Let me know what he says and we can put in NSG referral.   We do advise and like to re-enforce the importance of good blood pressure control, good cholesterol control, good blood sugar control, and weight management.  Please remind patient to keep any upcoming appointments or tests and to call us with any interim questions, concerns, problems or updates.  He can certainly think about the NSG referral and call us back.  Thanks,  Star Age, MD, PhD

## 2016-12-07 ENCOUNTER — Encounter: Payer: Medicare Other | Attending: Cardiovascular Disease | Admitting: *Deleted

## 2016-12-07 ENCOUNTER — Encounter: Payer: Self-pay | Admitting: *Deleted

## 2016-12-07 VITALS — Ht 68.9 in | Wt 182.1 lb

## 2016-12-07 DIAGNOSIS — I214 Non-ST elevation (NSTEMI) myocardial infarction: Secondary | ICD-10-CM

## 2016-12-07 DIAGNOSIS — Z955 Presence of coronary angioplasty implant and graft: Secondary | ICD-10-CM | POA: Diagnosis not present

## 2016-12-07 DIAGNOSIS — Z48812 Encounter for surgical aftercare following surgery on the circulatory system: Secondary | ICD-10-CM | POA: Insufficient documentation

## 2016-12-07 DIAGNOSIS — I213 ST elevation (STEMI) myocardial infarction of unspecified site: Secondary | ICD-10-CM | POA: Insufficient documentation

## 2016-12-07 NOTE — Progress Notes (Signed)
Cardiac Individual Treatment Plan  Patient Details  Name: Donald Riley MRN: [id number] Date of Birth: 1933/09/14 Referring Provider:     Cardiac Rehab from 12/07/2016 in [facility] Cardiac and Pulmonary Rehab  Referring Provider  Bartholome Bill MD     Initial Encounter Date: 12/07/16  Visit Diagnosis: ST elevation myocardial infarction (STEMI), unspecified artery [facility])  Status post coronary artery stent placement  NSTEMI (non-ST elevated myocardial infarction) ([place])  Patient's Home Medications on Admission:  Current Outpatient Prescriptions:  .  albuterol (VENTOLIN HFA) 108 (90 BASE) MCG/ACT inhaler, Inhale into the lungs every 4 (four) hours as needed. , Disp: , Rfl:  .  amiodarone (PACERONE) 200 MG tablet, , Disp: , Rfl:  .  aspirin 81 MG chewable tablet, Chew 1 tablet (81 mg total) by mouth daily., Disp: 30 tablet, Rfl: 2 .  atorvastatin (LIPITOR) 40 MG tablet, Take 1 tablet (40 mg total) by mouth daily at 6 PM., Disp: 30 tablet, Rfl: 2 .  brimonidine (ALPHAGAN P) 0.1 % SOLN, Alphagan P 0.1 % eye drops, Disp: , Rfl:  .  clopidogrel (PLAVIX) 75 MG tablet, Take 1 tablet (75 mg total) by mouth daily with breakfast., Disp: 30 tablet, Rfl: 2 .  dorzolamide-timolol (COSOPT) 22.3-6.8 MG/ML ophthalmic solution, , Disp: , Rfl:  .  EPINEPHrine 0.3 mg/0.3 mL IJ SOAJ injection, Inject 0.3 mLs (0.3 mg total) into the muscle once as needed., Disp: 6 Device, Rfl: 0 .  ferrous sulfate 325 (65 FE) MG tablet, Take 1 tablet (325 mg total) by mouth daily with breakfast., Disp: 30 tablet, Rfl: 2 .  furosemide (LASIX) 20 MG tablet, Take 1 tablet (20 mg total) by mouth daily., Disp: 30 tablet, Rfl: 2 .  latanoprost (XALATAN) 0.005 % ophthalmic solution, Place 1 drop into both eyes at bedtime. , Disp: , Rfl:  .  mometasone-formoterol (DULERA) 200-5 MCG/ACT AERO, Dulera 200 mcg-5 mcg/actuation HFA aerosol inhaler, Disp: , Rfl:  .  MULTIPLE VITAMIN PO, Take by mouth., Disp: , Rfl:  .  pantoprazole (PROTONIX) 40 MG  tablet, Take 40 mg by mouth daily. Reported on 07/02/2015, Disp: , Rfl:  .  pramipexole (MIRAPEX) 0.25 MG tablet, Take 0.25-0.5 mg by mouth 2 (two) times daily. , Disp: , Rfl:  .  tiotropium (SPIRIVA HANDIHALER) 18 MCG inhalation capsule, Place 18 mcg into inhaler and inhale daily. , Disp: , Rfl:  .  diltiazem (CARDIZEM CD) 120 MG 24 hr capsule, Take 120 mg by mouth., Disp: , Rfl:  .  metoprolol succinate (TOPROL-XL) 25 MG 24 hr tablet, metoprolol succinate ER 25 mg tablet,extended release 24 hr  Take 1 tablet every day by oral route., Disp: , Rfl:  .  oxyCODONE-acetaminophen (PERCOCET/ROXICET) 5-325 MG tablet, Take 1 tablet by mouth every 6 (six) hours as needed for moderate pain or severe pain (pain). (Patient not taking: Reported on 12/07/2016), Disp: 12 tablet, Rfl: 0 .  predniSONE (DELTASONE) 20 MG tablet, , Disp: , Rfl:   Past Medical History: Past Medical History:  Diagnosis Date  . Arthritis   . Asthma   . Atrial fibrillation ([place])   . Cancer ([place])   . GERD (gastroesophageal reflux disease)   . Glaucoma   . Gout   . Heart murmur   . Hypertension   . Neuropathy   . Osteoporosis   . Sleep apnea     Tobacco Use: History  Smoking Status  . Never Smoker  Smokeless Tobacco  . Never Used    Labs: Recent Review Flowsheet  Data    Labs for ITP Cardiac and Pulmonary Rehab Latest Ref Rng & Units 10/01/2016 10/01/2016 10/01/2016   Cholestrol 0 - 200 mg/dL - - 201(H)   LDLCALC 0 - 99 mg/dL - - 105(H)   HDL >40 mg/dL - - 82   Trlycerides <150 mg/dL - 87 68   PHART 7.350 - 7.450 7.31(L) - -   PCO2ART 32.0 - 48.0 mmHg 43 - -   HCO3 20.0 - 28.0 mmol/L 21.7 - -   ACIDBASEDEF 0.0 - 2.0 mmol/L 4.6(H) - -   O2SAT % 99.5 - -       Exercise Target Goals: Date: 12/07/16  Exercise Program Goal: Individual exercise prescription set with THRR, safety & activity barriers. Participant demonstrates ability to understand and report RPE using BORG scale, to self-measure pulse accurately, and to  acknowledge the importance of the exercise prescription.  Exercise Prescription Goal: Starting with aerobic activity 30 plus minutes a day, 3 days per week for initial exercise prescription. Provide home exercise prescription and guidelines that participant acknowledges understanding prior to discharge.  Activity Barriers & Risk Stratification:     Activity Barriers & Cardiac Risk Stratification - 12/07/16 1224      Activity Barriers & Cardiac Risk Stratification   Activity Barriers Back Problems;Neck/Spine Problems;Joint Problems;Arthritis;Other (comment);Balance Concerns;Assistive Device;Deconditioning;Muscular Weakness   Comments Neuropathy   Cardiac Risk Stratification High      6 Minute Walk:     6 Minute Walk    Row Name 12/07/16 1506         6 Minute Walk   Phase Initial     Distance 560 feet     Walk Time 6 minutes     # of Rest Breaks 0     METS 1.12     RPE 11     VO2 Peak 3.94     Symptoms No     Resting HR 64 bpm     Resting BP 136/72     Resting Oxygen Saturation  96 %     Exercise Oxygen Saturation  during 6 min walk 95 %     Max Ex. HR 100 bpm     Max Ex. BP 138/74     2 Minute Post BP 124/70        Oxygen Initial Assessment:   Oxygen Re-Evaluation:   Oxygen Discharge (Final Oxygen Re-Evaluation):   Initial Exercise Prescription:     Initial Exercise Prescription - 12/07/16 1500      Date of Initial Exercise RX and Referring Provider   Date 12/07/16   Referring Provider Bartholome Bill MD     Treadmill   MPH 1   Grade 0   Minutes 15   METs 1.77     NuStep   Level 1   SPM 80   Minutes 15   METs 1.5     Biostep-RELP   Level 1   SPM 50   Minutes 15   METs 2     Prescription Details   Frequency (times per week) 3   Duration Progress to 45 minutes of aerobic exercise without signs/symptoms of physical distress     Intensity   THRR 40-80% of Max Heartrate 94-124   Ratings of Perceived Exertion 11-13   Perceived Dyspnea 0-4      Progression   Progression Continue to progress workloads to maintain intensity without signs/symptoms of physical distress.     Resistance Training   Training Prescription Yes   Weight 3  lbs   Reps 10-15      Perform Capillary Blood Glucose checks as needed.  Exercise Prescription Changes:      Exercise Prescription Changes    Row Name 12/07/16 1500             Response to Exercise   Blood Pressure (Admit) 136/72       Blood Pressure (Exercise) 138/74       Blood Pressure (Exit) 124/70       Heart Rate (Admit) 64 bpm       Heart Rate (Exercise) 100 bpm       Heart Rate (Exit) 67 bpm       Oxygen Saturation (Admit) 96 %       Oxygen Saturation (Exercise) 95 %       Rating of Perceived Exertion (Exercise) 11       Symptoms none       Comments walk test results          Exercise Comments:   Exercise Goals and Review:      Exercise Goals    Row Name 12/07/16 1511             Exercise Goals   Increase Physical Activity Yes       Intervention Provide advice, education, support and counseling about physical activity/exercise needs.;Develop an individualized exercise prescription for aerobic and resistive training based on initial evaluation findings, risk stratification, comorbidities and participant's personal goals.       Expected Outcomes Achievement of increased cardiorespiratory fitness and enhanced flexibility, muscular endurance and strength shown through measurements of functional capacity and personal statement of participant.       Increase Strength and Stamina Yes       Intervention Provide advice, education, support and counseling about physical activity/exercise needs.;Develop an individualized exercise prescription for aerobic and resistive training based on initial evaluation findings, risk stratification, comorbidities and participant's personal goals.       Expected Outcomes Achievement of increased cardiorespiratory fitness and enhanced  flexibility, muscular endurance and strength shown through measurements of functional capacity and personal statement of participant.       Able to understand and use rate of perceived exertion (RPE) scale Yes       Intervention Provide education and explanation on how to use RPE scale       Expected Outcomes Short Term: Able to use RPE daily in rehab to express subjective intensity level;Long Term:  Able to use RPE to guide intensity level when exercising independently       Knowledge and understanding of Target Heart Rate Range (THRR) Yes       Intervention Provide education and explanation of THRR including how the numbers were predicted and where they are located for reference       Expected Outcomes Short Term: Able to state/look up THRR;Long Term: Able to use THRR to govern intensity when exercising independently;Short Term: Able to use daily as guideline for intensity in rehab       Able to check pulse independently Yes       Intervention Provide education and demonstration on how to check pulse in carotid and radial arteries.;Review the importance of being able to check your own pulse for safety during independent exercise       Expected Outcomes Short Term: Able to explain why pulse checking is important during independent exercise;Long Term: Able to check pulse independently and accurately       Understanding of Exercise Prescription Yes  Intervention Provide education, explanation, and written materials on patient's individual exercise prescription       Expected Outcomes Short Term: Able to explain program exercise prescription;Long Term: Able to explain home exercise prescription to exercise independently          Exercise Goals Re-Evaluation :   Discharge Exercise Prescription (Final Exercise Prescription Changes):     Exercise Prescription Changes - 12/07/16 1500      Response to Exercise   Blood Pressure (Admit) 136/72   Blood Pressure (Exercise) 138/74   Blood  Pressure (Exit) 124/70   Heart Rate (Admit) 64 bpm   Heart Rate (Exercise) 100 bpm   Heart Rate (Exit) 67 bpm   Oxygen Saturation (Admit) 96 %   Oxygen Saturation (Exercise) 95 %   Rating of Perceived Exertion (Exercise) 11   Symptoms none   Comments walk test results      Nutrition:  Target Goals: Understanding of nutrition guidelines, daily intake of sodium 1500mg , cholesterol 200mg , calories 30% from fat and 7% or less from saturated fats, daily to have 5 or more servings of fruits and vegetables.  Biometrics:     Pre Biometrics - 12/07/16 1511      Pre Biometrics   Height 5' 8.9" (1.75 m)   Weight 182 lb 1.6 oz (82.6 kg)   Waist Circumference 40.5 inches   Hip Circumference 40.75 inches   Waist to Hip Ratio 0.99 %   BMI (Calculated) 26.97   Single Leg Stand 2.03 seconds       Nutrition Therapy Plan and Nutrition Goals:     Nutrition Therapy & Goals - 12/07/16 1213      Nutrition Therapy   Drug/Food Interactions Statins/Certain Fruits      Nutrition Discharge: Rate Your Plate Scores:     Nutrition Assessments - 12/07/16 1213      MEDFICTS Scores   Pre Score 98      Nutrition Goals Re-Evaluation:   Nutrition Goals Discharge (Final Nutrition Goals Re-Evaluation):   Psychosocial: Target Goals: Acknowledge presence or absence of significant depression and/or stress, maximize coping skills, provide positive support system. Participant is able to verbalize types and ability to use techniques and skills needed for reducing stress and depression.   Initial Review & Psychosocial Screening:     Initial Psych Review & Screening - 12/07/16 1217      Initial Review   Current issues with Current Stress Concerns   Source of Stress Concerns Chronic Illness;Unable to perform yard/household activities     [place]? Yes     Barriers   Psychosocial barriers to participate in program The patient should benefit from training in  stress management and relaxation.     Screening Interventions   Interventions Encouraged to exercise;Yes   Expected Outcomes Short Term goal: Utilizing psychosocial counselor, staff and physician to assist with identification of specific Stressors or current issues interfering with healing process. Setting desired goal for each stressor or current issue identified.;Long Term Goal: Stressors or current issues are controlled or eliminated.;Short Term goal: Identification and review with participant of any Quality of Life or Depression concerns found by scoring the questionnaire.;Long Term goal: The participant improves quality of Life and PHQ9 Scores as seen by post scores and/or verbalization of changes      Quality of Life Scores:      Quality of Life - 12/07/16 1216      Quality of Life Scores   Health/Function Pre 18.6 %  Socioeconomic Pre 27.67 %   Psych/Spiritual Pre 20.14 %   Family Pre 28.8 %   GLOBAL Pre 22.12 %      PHQ-9: Recent Review Flowsheet Data    Depression screen [facility] 2/9 12/07/2016   Decreased Interest 1   Down, Depressed, Hopeless 1   PHQ - 2 Score 2   Altered sleeping 0   Tired, decreased energy 3   Change in appetite 0   Feeling bad or failure about yourself  0   Trouble concentrating 2   Moving slowly or fidgety/restless 0   Suicidal thoughts 0   PHQ-9 Score 7   Difficult doing work/chores Not difficult at all     Interpretation of Total Score  Total Score Depression Severity:  1-4 = Minimal depression, 5-9 = Mild depression, 10-14 = Moderate depression, 15-19 = Moderately severe depression, 20-27 = Severe depression   Psychosocial Evaluation and Intervention:   Psychosocial Re-Evaluation:   Psychosocial Discharge (Final Psychosocial Re-Evaluation):   Vocational Rehabilitation: Provide vocational rehab assistance to qualifying candidates.   Vocational Rehab Evaluation & Intervention:     Vocational Rehab - 12/07/16 1215      Initial  Vocational Rehab Evaluation & Intervention   Assessment shows need for Vocational Rehabilitation No      Education: Education Goals: Education classes will be provided on a variety of topics geared toward better understanding of heart health and risk factor modification. Participant will state understanding/return demonstration of topics presented as noted by education test scores.  Learning Barriers/Preferences:     Learning Barriers/Preferences - 12/07/16 1215      Learning Barriers/Preferences   Learning Barriers Hearing   Learning Preferences Video;Written Material      Education Topics: General Nutrition Guidelines/Fats and Fiber: -Group instruction provided by verbal, written material, models and posters to present the general guidelines for heart healthy nutrition. Gives an explanation and review of dietary fats and fiber.   Controlling Sodium/Reading Food Labels: -Group verbal and written material supporting the discussion of sodium use in heart healthy nutrition. Review and explanation with models, verbal and written materials for utilization of the food label.   Exercise Physiology & Risk Factors: - Group verbal and written instruction with models to review the exercise physiology of the cardiovascular system and associated critical values. Details cardiovascular disease risk factors and the goals associated with each risk factor.   Aerobic Exercise & Resistance Training: - Gives group verbal and written discussion on the health impact of inactivity. On the components of aerobic and resistive training programs and the benefits of this training and how to safely progress through these programs.   Flexibility, Balance, General Exercise Guidelines: - Provides group verbal and written instruction on the benefits of flexibility and balance training programs. Provides general exercise guidelines with specific guidelines to those with heart or lung disease. Demonstration and  skill practice provided.   Stress Management: - Provides group verbal and written instruction about the health risks of elevated stress, cause of high stress, and healthy ways to reduce stress.   Depression: - Provides group verbal and written instruction on the correlation between heart/lung disease and depressed mood, treatment options, and the stigmas associated with seeking treatment.   Anatomy & Physiology of the Heart: - Group verbal and written instruction and models provide basic cardiac anatomy and physiology, with the coronary electrical and arterial systems. Review of: AMI, Angina, Valve disease, Heart Failure, Cardiac Arrhythmia, Pacemakers, and the ICD.   Cardiac Procedures: - Group verbal and  written instruction to review commonly prescribed medications for heart disease. Reviews the medication, class of the drug, and side effects. Includes the steps to properly store meds and maintain the prescription regimen. (beta blockers and nitrates)   Cardiac Medications I: - Group verbal and written instruction to review commonly prescribed medications for heart disease. Reviews the medication, class of the drug, and side effects. Includes the steps to properly store meds and maintain the prescription regimen.   Cardiac Medications II: -Group verbal and written instruction to review commonly prescribed medications for heart disease. Reviews the medication, class of the drug, and side effects. (all other drug classes)    Go Sex-Intimacy & Heart Disease, Get SMART - Goal Setting: - Group verbal and written instruction through game format to discuss heart disease and the return to sexual intimacy. Provides group verbal and written material to discuss and apply goal setting through the application of the S.M.A.R.T. Method.   Other Matters of the Heart: - Provides group verbal, written materials and models to describe Heart Failure, Angina, Valve Disease, Peripheral Artery Disease,  and Diabetes in the realm of heart disease. Includes description of the disease process and treatment options available to the cardiac patient.   Exercise & Equipment Safety: - Individual verbal instruction and demonstration of equipment use and safety with use of the equipment.   Cardiac Rehab from 12/07/2016 in [facility] Cardiac and Pulmonary Rehab  Date  12/07/16  Educator  C. EnterkinRN  Instruction Review Code  3- Needs Reinforcement      Infection Prevention: - Provides verbal and written material to individual with discussion of infection control including proper hand washing and proper equipment cleaning during exercise session.   Cardiac Rehab from 12/07/2016 in [facility] Cardiac [facility] Pul[facility] Rehab  Date  12/07/16  Educator  C. EnterkinRN  Instruction Review Code  1- Verbalizes Understanding      Falls Prevention: - Provides verbal and written material to individual with discussion of falls prevention and safety.   Cardiac Rehab from 12/07/2016 in [facility] Cardiac and Pulmonary Rehab  Date  12/07/16  Educator  C. ENterkinRN  Instruction Review Code  1- Verbalizes Understanding      Diabetes: - Individual verbal and written instruction to review signs/symptoms of diabetes, desired ranges of glucose level fasting, after meals and with exercise. Acknowledge that pre and post exercise glucose checks will be done for 3 sessions at entry of program.   Other: -Provides group and verbal instruction on various topics (see comments)    Knowledge Questionnaire Score:     Knowledge Questionnaire Score - 12/07/16 1215      Knowledge Questionnaire Score   Pre Score 28/28      Core Components/Risk Factors/Patient Goals at Admission:     Personal Goals and Risk Factors at Admission - 12/07/16 1216      Core Components/Risk Factors/Patient Goals on Admission    Weight Management Yes;Weight Loss   Intervention Weight Management: Develop a combined nutrition and exercise program  designed to reach desired caloric intake, while maintaining appropriate intake of nutrient and fiber, sodium and fats, and appropriate energy expenditure required for the weight goal.;Weight Management: Provide education and appropriate resources to help participant work on and attain dietary goals.   Admit Weight 182 lb 1.6 oz (82.6 kg)   Goal Weight: Short Term 180 lb (81.6 kg)   Goal Weight: Long Term 172 lb (78 kg)   Expected Outcomes Short Term: Continue to assess and modify interventions until short term  weight is achieved;Long Term: Adherence to nutrition and physical activity/exercise program aimed toward attainment of established weight goal;Weight Loss: Understanding of general recommendations for a balanced deficit meal plan, which promotes 1-2 lb weight loss per week and includes a negative energy balance of [phone] kcal/d;Understanding recommendations for meals to include 15-35% energy as protein, 25-35% energy from fat, 35-60% energy from carbohydrates, less than 200mg  of dietary cholesterol, 20-35 gm of total fiber daily;Understanding of distribution of calorie intake throughout the day with the consumption of 4-5 meals/snacks   Hypertension Yes   Intervention Provide education on lifestyle modifcations including regular physical activity/exercise, weight management, moderate sodium restriction and increased consumption of fresh fruit, vegetables, and low fat dairy, alcohol moderation, and smoking cessation.;Monitor prescription use compliance.   Expected Outcomes Short Term: Continued assessment and intervention until BP is < 140/66mm HG in hypertensive participants. < 130/80mm HG in hypertensive participants with diabetes, heart failure or chronic kidney disease.;Long Term: Maintenance of blood pressure at goal levels.   Lipids Yes   Intervention Provide education and support for participant on nutrition & aerobic/resistive exercise along with prescribed medications to achieve LDL 70mg ,  HDL >40mg .   Expected Outcomes Short Term: Participant states understanding of desired cholesterol values and is compliant with medications prescribed. Participant is following exercise prescription and nutrition guidelines.;Long Term: Cholesterol controlled with medications as prescribed, with individualized exercise RX and with personalized nutrition plan. Value goals: LDL < 70mg , HDL > 40 mg.      Core Components/Risk Factors/Patient Goals Review:    Core Components/Risk Factors/Patient Goals at Discharge (Final Review):    ITP Comments:     ITP Comments    Row Name 12/07/16 1223 12/07/16 1227         ITP Comments ITP created during Medical Review after Cardiac Rehab informed consent was signed.  Smitty Knudsen, M.D. 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          Comments: Smitty Knudsen, M.D. 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 Initial ITP

## 2016-12-07 NOTE — Patient Instructions (Signed)
Patient Instructions  Patient Details  Name: Donald Riley MRN: [id number] Date of Birth: Sep 13, 1933 Referring Provider:  Wellington Hampshire, MD  Below are the personal goals you chose as well as exercise and nutrition goals. Our goal is to help you keep on track towards obtaining and maintaining your goals. We will be discussing your progress on these goals with you throughout the program.  Initial Exercise Prescription:     Initial Exercise Prescription - 12/07/16 1500      Date of Initial Exercise RX and Referring Provider   Date 12/07/16   Referring Provider Bartholome Bill MD     Treadmill   MPH 1   Grade 0   Minutes 15   METs 1.77     NuStep   Level 1   SPM 80   Minutes 15   METs 1.5     Biostep-RELP   Level 1   SPM 50   Minutes 15   METs 2     Prescription Details   Frequency (times per week) 3   Duration Progress to 45 minutes of aerobic exercise without signs/symptoms of physical distress     Intensity   THRR 40-80% of Max Heartrate 94-124   Ratings of Perceived Exertion 11-13   Perceived Dyspnea 0-4     Progression   Progression Continue to progress workloads to maintain intensity without signs/symptoms of physical distress.     Resistance Training   Training Prescription Yes   Weight 3 lbs   Reps 10-15      Exercise Goals: Frequency: Be able to perform aerobic exercise three times per week working toward 3-5 days per week.  Intensity: Work with a perceived exertion of 11 (fairly light) - 15 (hard) as tolerated. Follow your new exercise prescription and watch for changes in prescription as you progress with the program. Changes will be reviewed with you when they are made.  Duration: You should be able to do 30 minutes of continuous aerobic exercise in addition to a 5 minute warm-up and a 5 minute cool-down routine.  Nutrition Goals: Your personal nutrition goals will be established when you do your nutrition analysis with the dietician.  The  following are nutrition guidelines to follow: Cholesterol < 200mg /day Sodium < 1500mg /day Fiber: Men over 50 yrs - 30 grams per day  Personal Goals:     Personal Goals and Risk Factors at Admission - 12/07/16 1216      Core Components/Risk Factors/Patient Goals on Admission    Weight Management Yes;Weight Loss   Intervention Weight Management: Develop a combined nutrition and exercise program designed to reach desired caloric intake, while maintaining appropriate intake of nutrient and fiber, sodium and fats, and appropriate energy expenditure required for the weight goal.;Weight Management: Provide education and appropriate resources to help participant work on and attain dietary goals.   Admit Weight 182 lb 1.6 oz (82.6 kg)   Goal Weight: Short Term 180 lb (81.6 kg)   Goal Weight: Long Term 172 lb (78 kg)   Expected Outcomes Short Term: Continue to assess and modify interventions until short term weight is achieved;Long Term: Adherence to nutrition and physical activity/exercise program aimed toward attainment of established weight goal;Weight Loss: Understanding of general recommendations for a balanced deficit meal plan, which promotes 1-2 lb weight loss per week and includes a negative energy balance of [phone] kcal/d;Understanding recommendations for meals to include 15-35% energy as protein, 25-35% energy from fat, 35-60% energy from carbohydrates, less than 200mg  of dietary  cholesterol, 20-35 gm of total fiber daily;Understanding of distribution of calorie intake throughout the day with the consumption of 4-5 meals/snacks   Hypertension Yes   Intervention Provide education on lifestyle modifcations including regular physical activity/exercise, weight management, moderate sodium restriction and increased consumption of fresh fruit, vegetables, and low fat dairy, alcohol moderation, and smoking cessation.;Monitor prescription use compliance.   Expected Outcomes Short Term: Continued  assessment and intervention until BP is < 140/60mm HG in hypertensive participants. < 130/36mm HG in hypertensive participants with diabetes, heart failure or chronic kidney disease.;Long Term: Maintenance of blood pressure at goal levels.   Lipids Yes   Intervention Provide education and support for participant on nutrition & aerobic/resistive exercise along with prescribed medications to achieve LDL 70mg , HDL >40mg .   Expected Outcomes Short Term: Participant states understanding of desired cholesterol values and is compliant with medications prescribed. Participant is following exercise prescription and nutrition guidelines.;Long Term: Cholesterol controlled with medications as prescribed, with individualized exercise RX and with personalized nutrition plan. Value goals: LDL < 70mg , HDL > 40 mg.      Tobacco Use Initial Evaluation: History  Smoking Status  . Never Smoker  Smokeless Tobacco  . Never Used    Exercise Goals and Review:     Exercise Goals    Row Name 12/07/16 1511             Exercise Goals   Increase Physical Activity Yes       Intervention Provide advice, education, support and counseling about physical activity/exercise needs.;Develop an individualized exercise prescription for aerobic and resistive training based on initial evaluation findings, risk stratification, comorbidities and participant's personal goals.       Expected Outcomes Achievement of increased cardiorespiratory fitness and enhanced flexibility, muscular endurance and strength shown through measurements of functional capacity and personal statement of participant.       Increase Strength and Stamina Yes       Intervention Provide advice, education, support and counseling about physical activity/exercise needs.;Develop an individualized exercise prescription for aerobic and resistive training based on initial evaluation findings, risk stratification, comorbidities and participant's personal goals.        Expected Outcomes Achievement of increased cardiorespiratory fitness and enhanced flexibility, muscular endurance and strength shown through measurements of functional capacity and personal statement of participant.       Able to understand and use rate of perceived exertion (RPE) scale Yes       Intervention Provide education and explanation on how to use RPE scale       Expected Outcomes Short Term: Able to use RPE daily in rehab to express subjective intensity level;Long Term:  Able to use RPE to guide intensity level when exercising independently       Knowledge and understanding of Target Heart Rate Range (THRR) Yes       Intervention Provide education and explanation of THRR including how the numbers were predicted and where they are located for reference       Expected Outcomes Short Term: Able to state/look up THRR;Long Term: Able to use THRR to govern intensity when exercising independently;Short Term: Able to use daily as guideline for intensity in rehab       Able to check pulse independently Yes       Intervention Provide education and demonstration on how to check pulse in carotid and radial arteries.;Review the importance of being able to check your own pulse for safety during independent exercise  Expected Outcomes Short Term: Able to explain why pulse checking is important during independent exercise;Long Term: Able to check pulse independently and accurately       Understanding of Exercise Prescription Yes       Intervention Provide education, explanation, and written materials on patient's individual exercise prescription       Expected Outcomes Short Term: Able to explain program exercise prescription;Long Term: Able to explain home exercise prescription to exercise independently          Copy of goals given to participant.

## 2016-12-07 NOTE — Progress Notes (Signed)
Daily Session Note  Patient Details  Name: Donald Riley MRN: [id number] Date of Birth: 1933-12-03 Referring Provider:  Laveda Norman  Encounter Date: 12/07/2016  Check In:     Session Check In - 12/07/16 1223      Check-In   Location [facility]   Staff Present Alberteen Sam, MA, ACSM RCEP, Exercise Physiologist;Susanne Bice, RN, BSN, CCRP;Yaretsi Humphres, RN, BSN   Supervising physician immediately available to respond to emergencies See telemetry face sheet for immediately available ER MD   Medication changes reported     No   Fall or balance concerns reported    Yes   Comments USES A CANE TO WALK   Tobacco Cessation No Change   Warm-up and Cool-down Performed as group-led instruction   Resistance Training Performed Yes   VAD Patient? No     Pain Assessment   Currently in Pain? No/denies         History  Smoking Status  . Never Smoker  Smokeless Tobacco  . Never Used    Goals Met:  Proper associated with RPD/PD & O2 Sat Exercise tolerated well No report of cardiac concerns or symptoms Strength training completed today  Goals Unmet:  Not Applicable  Comments:     Dr. Emily Filbert is Medical Director for [facility].

## 2016-12-08 ENCOUNTER — Telehealth: Payer: Self-pay

## 2016-12-08 NOTE — Telephone Encounter (Signed)
I called pt. Pt reports that he saw his MRI results on mychart. I reiterated the findings with him and Dr. Guadelupe Sabin recommendation of NSY referral. Pt says that he already sees Dr. Quentin Cornwall or Dr. Marvel Plan at [facility] for his spine. He does not want another NSY referral and will follow up with his spine doctor at [facility]. I advised pt that he had an old bleed/hemorrhage in the brain which was seen previously in 2011 and not changed in size, which is reassuring. I explained that his brain showed moderate volume loss which is called atrophy which has become a little worse since the last scan 7 years ago. I explained that there were changes in the deeper structures of the brain, which we call white matter changes or microvascular changes, which were moderate in his case. I explained the conditions that these spots are seen in. 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 Rexene Alberts does want input from [place], but pt reports he has the spine doctor at [facility] and will follow up with him. I explained that no actual spinal cord compression was seen but the alignment of the very top of the spinal cord to the brainstem has been changed some.  Pt declined a follow up appt with Dr. Rexene Alberts at this time. I reinforced good BP, good cholesterol control, good blood sugar control, and weight management. Pt verbalized understanding of results. Pt had no questions at this time but was encouraged to call back if questions arise.

## 2016-12-08 NOTE — Telephone Encounter (Signed)
-----   Message from Star Age, MD sent at 12/04/2016 11:14 AM EDT ----- Please advise patient, that there are multiple findings on his brain MRI, none of which are new or acute:  1. He had an old bleed/hemorrhage in the brain, which was seen on his prior MRI in 2011 and has not changed in size, which is reassuring.  2. There was moderate volume loss which we call atrophy. This has become a little worse since the last scan 7 y ago, which is expected.  3. There were changes in the deeper structures of the brain, which we call white matter changes or microvascular changes. These were reported as moderate in His case. These are tiny white spots, that occur with time and are seen in a variety of conditions, including with normal aging, chronic hypertension, chronic headaches, especially migraine HAs, chronic diabetes, chronic hyperlipidemia. These changes have also progressed with time, which is expected.  4. 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 While there may not be anything to do, I would favor input from neurosurgery, as they may be able to verify that there are no treatments for this type of degenerative change. There is no actual spinal cord compression but the alignment of the very top of the spinal cord to the brainstem is changed some; again without obvious harm to the brainstem or spinal cord, which is reassuring. If he agrees, we can seek neurosurgical consultation for this.  Let me know what he says and we can put in NSG referral.   We do advise and like to re-enforce the importance of good blood pressure control, good cholesterol control, good blood sugar control, and weight management.  Please remind patient to keep any upcoming appointments or tests and to call us with any interim questions, concerns, problems or updates.  He can certainly think about the NSG referral and call us  back.  Thanks,  Star Age, MD, PhD

## 2016-12-09 ENCOUNTER — Encounter: Payer: Self-pay | Admitting: *Deleted

## 2016-12-09 ENCOUNTER — Encounter: Payer: Medicare Other | Admitting: *Deleted

## 2016-12-09 DIAGNOSIS — I213 ST elevation (STEMI) myocardial infarction of unspecified site: Secondary | ICD-10-CM

## 2016-12-09 NOTE — Progress Notes (Signed)
Cardiac Individual Treatment Plan  Patient Details  Name: Donald Riley MRN: [id number] Date of Birth: 12/19/1933 Referring Provider:     [facility]  Referring Provider  Bartholome Bill MD      Initial Encounter Date:    Cardiac Rehab from 12/07/2016 in [facility] Cardiac and Pulmonary Rehab  Date  12/07/16  Referring Provider  Bartholome Bill MD      Visit Diagnosis: ST elevation myocardial infarction (STEMI), unspecified artery ([place])  Patient's Home Medications on Admission:  Current Outpatient Prescriptions:  .  albuterol (VENTOLIN HFA) 108 (90 BASE) MCG/ACT inhaler, Inhale into the lungs every 4 (four) hours as needed. , Disp: , Rfl:  .  amiodarone (PACERONE) 200 MG tablet, , Disp: , Rfl:  .  aspirin 81 MG chewable tablet, Chew 1 tablet (81 mg total) by mouth daily., Disp: 30 tablet, Rfl: 2 .  atorvastatin (LIPITOR) 40 MG tablet, Take 1 tablet (40 mg total) by mouth daily at 6 PM., Disp: 30 tablet, Rfl: 2 .  brimonidine (ALPHAGAN P) 0.1 % SOLN, Alphagan P 0.1 % eye drops, Disp: , Rfl:  .  clopidogrel (PLAVIX) 75 MG tablet, Take 1 tablet (75 mg total) by mouth daily with breakfast., Disp: 30 tablet, Rfl: 2 .  diltiazem (CARDIZEM CD) 120 MG 24 hr capsule, Take 120 mg by mouth., Disp: , Rfl:  .  dorzolamide-timolol (COSOPT) 22.3-6.8 MG/ML ophthalmic solution, , Disp: , Rfl:  .  EPINEPHrine 0.3 mg/0.3 mL IJ SOAJ injection, Inject 0.3 mLs (0.3 mg total) into the muscle once as needed., Disp: 6 Device, Rfl: 0 .  ferrous sulfate 325 (65 FE) MG tablet, Take 1 tablet (325 mg total) by mouth daily with breakfast., Disp: 30 tablet, Rfl: 2 .  furosemide (LASIX) 20 MG tablet, Take 1 tablet (20 mg total) by mouth daily., Disp: 30 tablet, Rfl: 2 .  latanoprost (XALATAN) 0.005 % ophthalmic solution, Place 1 drop into both eyes at bedtime. , Disp: , Rfl:  .  metoprolol succinate (TOPROL-XL) 25 MG 24 hr tablet, metoprolol succinate ER 25 mg  tablet,extended release 24 hr  Take 1 tablet every day by oral route., Disp: , Rfl:  .  mometasone-formoterol (DULERA) 200-5 MCG/ACT AERO, Dulera 200 mcg-5 mcg/actuation HFA aerosol inhaler, Disp: , Rfl:  .  MULTIPLE VITAMIN PO, Take by mouth., Disp: , Rfl:  .  oxyCODONE-acetaminophen (PERCOCET/ROXICET) 5-325 MG tablet, Take 1 tablet by mouth every 6 (six) hours as needed for moderate pain or severe pain (pain). (Patient not taking: Reported on 12/07/2016), Disp: 12 tablet, Rfl: 0 .  pantoprazole (PROTONIX) 40 MG tablet, Take 40 mg by mouth daily. Reported on 07/02/2015, Disp: , Rfl:  .  pramipexole (MIRAPEX) 0.25 MG tablet, Take 0.25-0.5 mg by mouth 2 (two) times daily. , Disp: , Rfl:  .  predniSONE (DELTASONE) 20 MG tablet, , Disp: , Rfl:  .  tiotropium (SPIRIVA HANDIHALER) 18 MCG inhalation capsule, Place 18 mcg into inhaler and inhale daily. , Disp: , Rfl:   Past Medical History: Past Medical History:  Diagnosis Date  . Arthritis   . Asthma   . Atrial fibrillation ([place])   . Cancer ([place])   . GERD (gastroesophageal reflux disease)   . Glaucoma   . Gout   . Heart murmur   . Hypertension   . Neuropathy   . Osteoporosis   . Sleep apnea     Tobacco Use: History  Smoking Status  . Never Smoker  Smokeless Tobacco  . Never Used    Labs: Recent Review Flowsheet Data    Labs for ITP Cardiac and Pulmonary Rehab Latest Ref Rng & Units 10/01/2016 10/01/2016 10/01/2016   Cholestrol 0 - 200 mg/dL - - 201(H)   LDLCALC 0 - 99 mg/dL - - 105(H)   HDL >40 mg/dL - - 82   Trlycerides <150 mg/dL - 87 68   PHART 7.350 - 7.450 7.31(L) - -   PCO2ART 32.0 - 48.0 mmHg 43 - -   HCO3 20.0 - 28.0 mmol/L 21.7 - -   ACIDBASEDEF 0.0 - 2.0 mmol/L 4.6(H) - -   O2SAT % 99.5 - -       Exercise Target Goals:    Exercise Program Goal: Individual exercise prescription set with THRR, safety & activity barriers. Participant demonstrates ability to understand and report RPE using BORG scale, to self-measure  pulse accurately, and to acknowledge the importance of the exercise prescription.  Exercise Prescription Goal: Starting with aerobic activity 30 plus minutes a day, 3 days per week for initial exercise prescription. Provide home exercise prescription and guidelines that participant acknowledges understanding prior to discharge.  Activity Barriers & Risk Stratification:     Activity Barriers & Cardiac Risk Stratification - 12/07/16 1224      Activity Barriers & Cardiac Risk Stratification   Activity Barriers Back Problems;Neck/Spine Problems;Joint Problems;Arthritis;Other (comment);Balance Concerns;Assistive Device;Deconditioning;Muscular Weakness   Comments Neuropathy   Cardiac Risk Stratification High      6 Minute Walk:     6 Minute Walk    Row Name 12/07/16 1506         6 Minute Walk   Phase Initial     Distance 560 feet     Walk Time 6 minutes     # of Rest Breaks 0     METS 1.12     RPE 11     VO2 Peak 3.94     Symptoms No     Resting HR 64 bpm     Resting BP 136/72     Resting Oxygen Saturation  96 %     Exercise Oxygen Saturation  during 6 min walk 95 %     Max Ex. HR 100 bpm     Max Ex. BP 138/74     2 Minute Post BP 124/70        Oxygen Initial Assessment:   Oxygen Re-Evaluation:   Oxygen Discharge (Final Oxygen Re-Evaluation):   Initial Exercise Prescription:     Initial Exercise Prescription - 12/07/16 1500      Date of Initial Exercise RX and Referring Provider   Date 12/07/16   Referring Provider Bartholome Bill MD     Treadmill   MPH 1   Grade 0   Minutes 15   METs 1.77     NuStep   Level 1   SPM 80   Minutes 15   METs 1.5     Biostep-RELP   Level 1   SPM 50   Minutes 15   METs 2     Prescription Details   Frequency (times per week) 3   Duration Progress to 45 minutes of aerobic exercise without signs/symptoms of physical distress     Intensity   THRR 40-80% of Max Heartrate 94-124   Ratings of Perceived Exertion 11-13    Perceived Dyspnea 0-4     Progression   Progression Continue to progress workloads to maintain intensity without signs/symptoms of physical distress.  Resistance Training   Training Prescription Yes   Weight 3 lbs   Reps 10-15      Perform Capillary Blood Glucose checks as needed.  Exercise Prescription Changes:     Exercise Prescription Changes    Row Name 12/07/16 1500             Response to Exercise   Blood Pressure (Admit) 136/72       Blood Pressure (Exercise) 138/74       Blood Pressure (Exit) 124/70       Heart Rate (Admit) 64 bpm       Heart Rate (Exercise) 100 bpm       Heart Rate (Exit) 67 bpm       Oxygen Saturation (Admit) 96 %       Oxygen Saturation (Exercise) 95 %       Rating of Perceived Exertion (Exercise) 11       Symptoms none       Comments walk test results          Exercise Comments:   Exercise Goals and Review:     Exercise Goals    Row Name 12/07/16 1511             Exercise Goals   Increase Physical Activity Yes       Intervention Provide advice, education, support and counseling about physical activity/exercise needs.;Develop an individualized exercise prescription for aerobic and resistive training based on initial evaluation findings, risk stratification, comorbidities and participant's personal goals.       Expected Outcomes Achievement of increased cardiorespiratory fitness and enhanced flexibility, muscular endurance and strength shown through measurements of functional capacity and personal statement of participant.       Increase Strength and Stamina Yes       Intervention Provide advice, education, support and counseling about physical activity/exercise needs.;Develop an individualized exercise prescription for aerobic and resistive training based on initial evaluation findings, risk stratification, comorbidities and participant's personal goals.       Expected Outcomes Achievement of increased cardiorespiratory  fitness and enhanced flexibility, muscular endurance and strength shown through measurements of functional capacity and personal statement of participant.       Able to understand and use rate of perceived exertion (RPE) scale Yes       Intervention Provide education and explanation on how to use RPE scale       Expected Outcomes Short Term: Able to use RPE daily in rehab to express subjective intensity level;Long Term:  Able to use RPE to guide intensity level when exercising independently       Knowledge and understanding of Target Heart Rate Range (THRR) Yes       Intervention Provide education and explanation of THRR including how the numbers were predicted and where they are located for reference       Expected Outcomes Short Term: Able to state/look up THRR;Long Term: Able to use THRR to govern intensity when exercising independently;Short Term: Able to use daily as guideline for intensity in rehab       Able to check pulse independently Yes       Intervention Provide education and demonstration on how to check pulse in carotid and radial arteries.;Review the importance of being able to check your own pulse for safety during independent exercise       Expected Outcomes Short Term: Able to explain why pulse checking is important during independent exercise;Long Term: Able to check pulse independently and accurately  Understanding of Exercise Prescription Yes       Intervention Provide education, explanation, and written materials on patient's individual exercise prescription       Expected Outcomes Short Term: Able to explain program exercise prescription;Long Term: Able to explain home exercise prescription to exercise independently          Exercise Goals Re-Evaluation :   Discharge Exercise Prescription (Final Exercise Prescription Changes):     Exercise Prescription Changes - 12/07/16 1500      Response to Exercise   Blood Pressure (Admit) 136/72   Blood Pressure (Exercise)  138/74   Blood Pressure (Exit) 124/70   Heart Rate (Admit) 64 bpm   Heart Rate (Exercise) 100 bpm   Heart Rate (Exit) 67 bpm   Oxygen Saturation (Admit) 96 %   Oxygen Saturation (Exercise) 95 %   Rating of Perceived Exertion (Exercise) 11   Symptoms none   Comments walk test results      Nutrition:  Target Goals: Understanding of nutrition guidelines, daily intake of sodium 1500mg , cholesterol 200mg , calories 30% from fat and 7% or less from saturated fats, daily to have 5 or more servings of fruits and vegetables.  Biometrics:     Pre Biometrics - 12/07/16 1511      Pre Biometrics   Height 5' 8.9" (1.75 m)   Weight 182 lb 1.6 oz (82.6 kg)   Waist Circumference 40.5 inches   Hip Circumference 40.75 inches   Waist to Hip Ratio 0.99 %   BMI (Calculated) 26.97   Single Leg Stand 2.03 seconds       Nutrition Therapy Plan and Nutrition Goals:     Nutrition Therapy & Goals - 12/07/16 1213      Nutrition Therapy   Drug/Food Interactions Statins/Certain Fruits      Nutrition Discharge: Rate Your Plate Scores:     Nutrition Assessments - 12/07/16 1213      MEDFICTS Scores   Pre Score 98      Nutrition Goals Re-Evaluation:   Nutrition Goals Discharge (Final Nutrition Goals Re-Evaluation):   Psychosocial: Target Goals: Acknowledge presence or absence of significant depression and/or stress, maximize coping skills, provide positive support system. Participant is able to verbalize types and ability to use techniques and skills needed for reducing stress and depression.   Initial Review & Psychosocial Screening:     Initial Psych Review & Screening - 12/07/16 1217      Initial Review   Current issues with Current Stress Concerns   Source of Stress Concerns Chronic Illness;Unable to perform yard/household activities     [place]? Yes     Barriers   Psychosocial barriers to participate in program The patient should benefit from  training in stress management and relaxation.     Screening Interventions   Interventions Encouraged to exercise;Yes   Expected Outcomes Short Term goal: Utilizing psychosocial counselor, staff and physician to assist with identification of specific Stressors or current issues interfering with healing process. Setting desired goal for each stressor or current issue identified.;Long Term Goal: Stressors or current issues are controlled or eliminated.;Short Term goal: Identification and review with participant of any Quality of Life or Depression concerns found by scoring the questionnaire.;Long Term goal: The participant improves quality of Life and PHQ9 Scores as seen by post scores and/or verbalization of changes      Quality of Life Scores:      Quality of Life - 12/07/16 1216  Quality of Life Scores   Health/Function Pre 18.6 %   Socioeconomic Pre 27.67 %   Psych/Spiritual Pre 20.14 %   Family Pre 28.8 %   GLOBAL Pre 22.12 %      PHQ-9: Recent Review Flowsheet Data    Depression screen [facility] 2/9 12/07/2016   Decreased Interest 1   Down, Depressed, Hopeless 1   PHQ - 2 Score 2   Altered sleeping 0   Tired, decreased energy 3   Change in appetite 0   Feeling bad or failure about yourself  0   Trouble concentrating 2   Moving slowly or fidgety/restless 0   Suicidal thoughts 0   PHQ-9 Score 7   Difficult doing work/chores Not difficult at all     Interpretation of Total Score  Total Score Depression Severity:  1-4 = Minimal depression, 5-9 = Mild depression, 10-14 = Moderate depression, 15-19 = Moderately severe depression, 20-27 = Severe depression   Psychosocial Evaluation and Intervention:   Psychosocial Re-Evaluation:   Psychosocial Discharge (Final Psychosocial Re-Evaluation):   Vocational Rehabilitation: Provide vocational rehab assistance to qualifying candidates.   Vocational Rehab Evaluation & Intervention:     Vocational Rehab - 12/07/16 1215       Initial Vocational Rehab Evaluation & Intervention   Assessment shows need for Vocational Rehabilitation No      Education: Education Goals: Education classes will be provided on a variety of topics geared toward better understanding of heart health and risk factor modification. Participant will state understanding/return demonstration of topics presented as noted by education test scores.  Learning Barriers/Preferences:     Learning Barriers/Preferences - 12/07/16 1215      Learning Barriers/Preferences   Learning Barriers Hearing   Learning Preferences Video;Written Material      Education Topics: General Nutrition Guidelines/Fats and Fiber: -Group instruction provided by verbal, written material, models and posters to present the general guidelines for heart healthy nutrition. Gives an explanation and review of dietary fats and fiber.   Controlling Sodium/Reading Food Labels: -Group verbal and written material supporting the discussion of sodium use in heart healthy nutrition. Review and explanation with models, verbal and written materials for utilization of the food label.   Exercise Physiology & Risk Factors: - Group verbal and written instruction with models to review the exercise physiology of the cardiovascular system and associated critical values. Details cardiovascular disease risk factors and the goals associated with each risk factor.   Aerobic Exercise & Resistance Training: - Gives group verbal and written discussion on the health impact of inactivity. On the components of aerobic and resistive training programs and the benefits of this training and how to safely progress through these programs.   Flexibility, Balance, General Exercise Guidelines: - Provides group verbal and written instruction on the benefits of flexibility and balance training programs. Provides general exercise guidelines with specific guidelines to those with heart or lung disease.  Demonstration and skill practice provided.   Stress Management: - Provides group verbal and written instruction about the health risks of elevated stress, cause of high stress, and healthy ways to reduce stress.   Depression: - Provides group verbal and written instruction on the correlation between heart/lung disease and depressed mood, treatment options, and the stigmas associated with seeking treatment.   Anatomy & Physiology of the Heart: - Group verbal and written instruction and models provide basic cardiac anatomy and physiology, with the coronary electrical and arterial systems. Review of: AMI, Angina, Valve disease, Heart Failure, Cardiac Arrhythmia,  Pacemakers, and the ICD.   Cardiac Procedures: - Group verbal and written instruction to review commonly prescribed medications for heart disease. Reviews the medication, class of the drug, and side effects. Includes the steps to properly store meds and maintain the prescription regimen. (beta blockers and nitrates)   Cardiac Medications I: - Group verbal and written instruction to review commonly prescribed medications for heart disease. Reviews the medication, class of the drug, and side effects. Includes the steps to properly store meds and maintain the prescription regimen.   Cardiac Medications II: -Group verbal and written instruction to review commonly prescribed medications for heart disease. Reviews the medication, class of the drug, and side effects. (all other drug classes)    Go Sex-Intimacy & Heart Disease, Get SMART - Goal Setting: - Group verbal and written instruction through game format to discuss heart disease and the return to sexual intimacy. Provides group verbal and written material to discuss and apply goal setting through the application of the S.M.A.R.T. Method.   Other Matters of the Heart: - Provides group verbal, written materials and models to describe Heart Failure, Angina, Valve Disease, Peripheral  Artery Disease, and Diabetes in the realm of heart disease. Includes description of the disease process and treatment options available to the cardiac patient.   Exercise & Equipment Safety: - Individual verbal instruction and demonstration of equipment use and safety with use of the equipment.   Cardiac Rehab from 12/07/2016 in [facility] Cardiac and Pulmonary Rehab  Date  12/07/16  Educator  C. EnterkinRN  Instruction Review Code  3- Needs Reinforcement      Infection Prevention: - Provides verbal and written material to individual with discussion of infection control including proper hand washing and proper equipment cleaning during exercise session.   Cardiac Rehab from 12/07/2016 in [facility] Cardiac and Pulmonary Rehab  Date  12/07/16  Educator  C. EnterkinRN  Instruction Review Code  1- Verbalizes Understanding      Falls Prevention: - Provides verbal and written material to individual with discussion of falls prevention and safety.   Cardiac Rehab from 12/07/2016 in [facility] Cardiac and Pulmonary Rehab  Date  12/07/16  Educator  C. ENterkinRN  Instruction Review Code  1- Verbalizes Understanding      Diabetes: - Individual verbal and written instruction to review signs/symptoms of diabetes, desired ranges of glucose level fasting, after meals and with exercise. Acknowledge that pre and post exercise glucose checks will be done for 3 sessions at entry of program.   Other: -Provides group and verbal instruction on various topics (see comments)    Knowledge Questionnaire Score:     Knowledge Questionnaire Score - 12/07/16 1215      Knowledge Questionnaire Score   Pre Score 28/28      Core Components/Risk Factors/Patient Goals at Admission:     Personal Goals and Risk Factors at Admission - 12/07/16 1216      Core Components/Risk Factors/Patient Goals on Admission    Weight Management Yes;Weight Loss   Intervention Weight Management: Develop a combined nutrition and exercise  program designed to reach desired caloric intake, while maintaining appropriate intake of nutrient and fiber, sodium and fats, and appropriate energy expenditure required for the weight goal.;Weight Management: Provide education and appropriate resources to help participant work on and attain dietary goals.   Admit Weight 182 lb 1.6 oz (82.6 kg)   Goal Weight: Short Term 180 lb (81.6 kg)   Goal Weight: Long Term 172 lb (78 kg)   Expected  Outcomes Short Term: Continue to assess and modify interventions until short term weight is achieved;Long Term: Adherence to nutrition and physical activity/exercise program aimed toward attainment of established weight goal;Weight Loss: Understanding of general recommendations for a balanced deficit meal plan, which promotes 1-2 lb weight loss per week and includes a negative energy balance of [phone] kcal/d;Understanding recommendations for meals to include 15-35% energy as protein, 25-35% energy from fat, 35-60% energy from carbohydrates, less than 200mg  of dietary cholesterol, 20-35 gm of total fiber daily;Understanding of distribution of calorie intake throughout the day with the consumption of 4-5 meals/snacks   Hypertension Yes   Intervention Provide education on lifestyle modifcations including regular physical activity/exercise, weight management, moderate sodium restriction and increased consumption of fresh fruit, vegetables, and low fat dairy, alcohol moderation, and smoking cessation.;Monitor prescription use compliance.   Expected Outcomes Short Term: Continued assessment and intervention until BP is < 140/76mm HG in hypertensive participants. < 130/56mm HG in hypertensive participants with diabetes, heart failure or chronic kidney disease.;Long Term: Maintenance of blood pressure at goal levels.   Lipids Yes   Intervention Provide education and support for participant on nutrition & aerobic/resistive exercise along with prescribed medications to achieve LDL  70mg , HDL >40mg .   Expected Outcomes Short Term: Participant states understanding of desired cholesterol values and is compliant with medications prescribed. Participant is following exercise prescription and nutrition guidelines.;Long Term: Cholesterol controlled with medications as prescribed, with individualized exercise RX and with personalized nutrition plan. Value goals: LDL < 70mg , HDL > 40 mg.      Core Components/Risk Factors/Patient Goals Review:    Core Components/Risk Factors/Patient Goals at Discharge (Final Review):    ITP Comments:     ITP Comments    Row Name 12/07/16 1223 12/07/16 1227 12/09/16 1144       ITP Comments ITP created during Medical Review after Cardiac Rehab informed consent was signed.  Smitty Knudsen, M.D. 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  30 day review. Continue with ITP unless directed changes per Medical Director review.          Comments:

## 2016-12-09 NOTE — Progress Notes (Signed)
Daily Session Note  Patient Details  Name: Donald Riley MRN: [id number] Date of Birth: 08/18/1933 Referring Provider:     Cardiac Rehab from 12/07/2016 in [facility] Cardiac and Pulmonary Rehab  Referring Provider  Fath, Kenneth MD      Encounter Date: 12/09/2016  Check In:     Session Check In - 12/09/16 1706      Check-In   Location [facility]-Cardiac & Pulmonary Rehab   Staff Present Meredith Craven, RN BSN;Carroll Enterkin, RN, BSN;Amanda Sommer, BA, ACSM CEP, Exercise Physiologist   Supervising physician immediately available to respond to emergencies See telemetry face sheet for immediately available ER MD   Medication changes reported     No   Fall or balance concerns reported    No   Warm-up and Cool-down Performed on first and last piece of equipment   Resistance Training Performed Yes   VAD Patient? No     Pain Assessment   Currently in Pain? No/denies         History  Smoking Status  . Never Smoker  Smokeless Tobacco  . Never Used    Goals Met:  Proper associated with RPD/PD & O2 Sat Independence with exercise equipment Exercise tolerated well Personal goals reviewed No report of cardiac concerns or symptoms Strength training completed today  Goals Unmet:  Not Applicable  Comments: First full day of exercise!  Patient was oriented to gym and equipment including functions, settings, policies, and procedures.  Patient's individual exercise prescription and treatment plan were reviewed.  All starting workloads were established based on the results of the 6 minute walk test done at initial orientation visit.  The plan for exercise progression was also introduced and progression will be customized based on patient's performance and goals.    Dr. Mark Miller is Medical Director for [facility] and [facility]. 

## 2016-12-09 NOTE — Progress Notes (Signed)
Cardiac Individual Treatment Plan  Patient Details  Name: Donald Riley MRN: [id number] Date of Birth: April 04, 1933 Referring Provider:     Cardiac Rehab from 12/07/2016 in [facility] Cardiac and Pulmonary Rehab  Referring Provider  Bartholome Bill MD      Initial Encounter Date:    Cardiac Rehab from 12/07/2016 in [facility] Cardiac and Pulmonary Rehab  Date  12/07/16  Referring Provider  Bartholome Bill MD      Visit Diagnosis: ST elevation myocardial infarction (STEMI), unspecified artery [facility])  Status post coronary artery stent placement  NSTEMI (non-ST elevated myocardial infarction) ([place])  Patient's Home Medications on Admission:  Current Outpatient Prescriptions:  .  albuterol (VENTOLIN HFA) 108 (90 BASE) MCG/ACT inhaler, Inhale into the lungs every 4 (four) hours as needed. , Disp: , Rfl:  .  amiodarone (PACERONE) 200 MG tablet, , Disp: , Rfl:  .  aspirin 81 MG chewable tablet, Chew 1 tablet (81 mg total) by mouth daily., Disp: 30 tablet, Rfl: 2 .  atorvastatin (LIPITOR) 40 MG tablet, Take 1 tablet (40 mg total) by mouth daily at 6 PM., Disp: 30 tablet, Rfl: 2 .  brimonidine (ALPHAGAN P) 0.1 % SOLN, Alphagan P 0.1 % eye drops, Disp: , Rfl:  .  clopidogrel (PLAVIX) 75 MG tablet, Take 1 tablet (75 mg total) by mouth daily with breakfast., Disp: 30 tablet, Rfl: 2 .  dorzolamide-timolol (COSOPT) 22.3-6.8 MG/ML ophthalmic solution, , Disp: , Rfl:  .  EPINEPHrine 0.3 mg/0.3 mL IJ SOAJ injection, Inject 0.3 mLs (0.3 mg total) into the muscle once as needed., Disp: 6 Device, Rfl: 0 .  ferrous sulfate 325 (65 FE) MG tablet, Take 1 tablet (325 mg total) by mouth daily with breakfast., Disp: 30 tablet, Rfl: 2 .  furosemide (LASIX) 20 MG tablet, Take 1 tablet (20 mg total) by mouth daily., Disp: 30 tablet, Rfl: 2 .  latanoprost (XALATAN) 0.005 % ophthalmic solution, Place 1 drop into both eyes at bedtime. , Disp: , Rfl:  .  mometasone-formoterol (DULERA) 200-5 MCG/ACT AERO, Dulera 200 mcg-5  mcg/actuation HFA aerosol inhaler, Disp: , Rfl:  .  MULTIPLE VITAMIN PO, Take by mouth., Disp: , Rfl:  .  pantoprazole (PROTONIX) 40 MG tablet, Take 40 mg by mouth daily. Reported on 07/02/2015, Disp: , Rfl:  .  pramipexole (MIRAPEX) 0.25 MG tablet, Take 0.25-0.5 mg by mouth 2 (two) times daily. , Disp: , Rfl:  .  tiotropium (SPIRIVA HANDIHALER) 18 MCG inhalation capsule, Place 18 mcg into inhaler and inhale daily. , Disp: , Rfl:  .  diltiazem (CARDIZEM CD) 120 MG 24 hr capsule, Take 120 mg by mouth., Disp: , Rfl:  .  metoprolol succinate (TOPROL-XL) 25 MG 24 hr tablet, metoprolol succinate ER 25 mg tablet,extended release 24 hr  Take 1 tablet every day by oral route., Disp: , Rfl:  .  oxyCODONE-acetaminophen (PERCOCET/ROXICET) 5-325 MG tablet, Take 1 tablet by mouth every 6 (six) hours as needed for moderate pain or severe pain (pain). (Patient not taking: Reported on 12/07/2016), Disp: 12 tablet, Rfl: 0 .  predniSONE (DELTASONE) 20 MG tablet, , Disp: , Rfl:   Past Medical History: Past Medical History:  Diagnosis Date  . Arthritis   . Asthma   . Atrial fibrillation ([place])   . Cancer ([place])   . GERD (gastroesophageal reflux disease)   . Glaucoma   . Gout   . Heart murmur   . Hypertension   . Neuropathy   . Osteoporosis   . Sleep apnea  Tobacco Use: History  Smoking Status  . Never Smoker  Smokeless Tobacco  . Never Used    Labs: Recent Review Flowsheet Data    Labs for ITP Cardiac and Pulmonary Rehab Latest Ref Rng & Units 10/01/2016 10/01/2016 10/01/2016   Cholestrol 0 - 200 mg/dL - - 201(H)   LDLCALC 0 - 99 mg/dL - - 105(H)   HDL >40 mg/dL - - 82   Trlycerides <150 mg/dL - 87 68   PHART 7.350 - 7.450 7.31(L) - -   PCO2ART 32.0 - 48.0 mmHg 43 - -   HCO3 20.0 - 28.0 mmol/L 21.7 - -   ACIDBASEDEF 0.0 - 2.0 mmol/L 4.6(H) - -   O2SAT % 99.5 - -       Exercise Target Goals: Date: 12/07/16  Exercise Program Goal: Individual exercise prescription set with THRR, safety &  activity barriers. Participant demonstrates ability to understand and report RPE using BORG scale, to self-measure pulse accurately, and to acknowledge the importance of the exercise prescription.  Exercise Prescription Goal: Starting with aerobic activity 30 plus minutes a day, 3 days per week for initial exercise prescription. Provide home exercise prescription and guidelines that participant acknowledges understanding prior to discharge.  Activity Barriers & Risk Stratification:     Activity Barriers & Cardiac Risk Stratification - 12/07/16 1224      Activity Barriers & Cardiac Risk Stratification   Activity Barriers Back Problems;Neck/Spine Problems;Joint Problems;Arthritis;Other (comment);Balance Concerns;Assistive Device;Deconditioning;Muscular Weakness   Comments Neuropathy   Cardiac Risk Stratification High      6 Minute Walk:     6 Minute Walk    Row Name 12/07/16 1506         6 Minute Walk   Phase Initial     Distance 560 feet     Walk Time 6 minutes     # of Rest Breaks 0     METS 1.12     RPE 11     VO2 Peak 3.94     Symptoms No     Resting HR 64 bpm     Resting BP 136/72     Resting Oxygen Saturation  96 %     Exercise Oxygen Saturation  during 6 min walk 95 %     Max Ex. HR 100 bpm     Max Ex. BP 138/74     2 Minute Post BP 124/70        Oxygen Initial Assessment:   Oxygen Re-Evaluation:   Oxygen Discharge (Final Oxygen Re-Evaluation):   Initial Exercise Prescription:     Initial Exercise Prescription - 12/07/16 1500      Date of Initial Exercise RX and Referring Provider   Date 12/07/16   Referring Provider Bartholome Bill MD     Treadmill   MPH 1   Grade 0   Minutes 15   METs 1.77     NuStep   Level 1   SPM 80   Minutes 15   METs 1.5     Biostep-RELP   Level 1   SPM 50   Minutes 15   METs 2     Prescription Details   Frequency (times per week) 3   Duration Progress to 45 minutes of aerobic exercise without signs/symptoms  of physical distress     Intensity   THRR 40-80% of Max Heartrate 94-124   Ratings of Perceived Exertion 11-13   Perceived Dyspnea 0-4     Progression   Progression Continue to progress  workloads to maintain intensity without signs/symptoms of physical distress.     Resistance Training   Training Prescription Yes   Weight 3 lbs   Reps 10-15      Perform Capillary Blood Glucose checks as needed.  Exercise Prescription Changes:     Exercise Prescription Changes    Row Name 12/07/16 1500             Response to Exercise   Blood Pressure (Admit) 136/72       Blood Pressure (Exercise) 138/74       Blood Pressure (Exit) 124/70       Heart Rate (Admit) 64 bpm       Heart Rate (Exercise) 100 bpm       Heart Rate (Exit) 67 bpm       Oxygen Saturation (Admit) 96 %       Oxygen Saturation (Exercise) 95 %       Rating of Perceived Exertion (Exercise) 11       Symptoms none       Comments walk test results          Exercise Comments:   Exercise Goals and Review:     Exercise Goals    Row Name 12/07/16 1511             Exercise Goals   Increase Physical Activity Yes       Intervention Provide advice, education, support and counseling about physical activity/exercise needs.;Develop an individualized exercise prescription for aerobic and resistive training based on initial evaluation findings, risk stratification, comorbidities and participant's personal goals.       Expected Outcomes Achievement of increased cardiorespiratory fitness and enhanced flexibility, muscular endurance and strength shown through measurements of functional capacity and personal statement of participant.       Increase Strength and Stamina Yes       Intervention Provide advice, education, support and counseling about physical activity/exercise needs.;Develop an individualized exercise prescription for aerobic and resistive training based on initial evaluation findings, risk stratification,  comorbidities and participant's personal goals.       Expected Outcomes Achievement of increased cardiorespiratory fitness and enhanced flexibility, muscular endurance and strength shown through measurements of functional capacity and personal statement of participant.       Able to understand and use rate of perceived exertion (RPE) scale Yes       Intervention Provide education and explanation on how to use RPE scale       Expected Outcomes Short Term: Able to use RPE daily in rehab to express subjective intensity level;Long Term:  Able to use RPE to guide intensity level when exercising independently       Knowledge and understanding of Target Heart Rate Range (THRR) Yes       Intervention Provide education and explanation of THRR including how the numbers were predicted and where they are located for reference       Expected Outcomes Short Term: Able to state/look up THRR;Long Term: Able to use THRR to govern intensity when exercising independently;Short Term: Able to use daily as guideline for intensity in rehab       Able to check pulse independently Yes       Intervention Provide education and demonstration on how to check pulse in carotid and radial arteries.;Review the importance of being able to check your own pulse for safety during independent exercise       Expected Outcomes Short Term: Able to explain why pulse checking is important  during independent exercise;Long Term: Able to check pulse independently and accurately       Understanding of Exercise Prescription Yes       Intervention Provide education, explanation, and written materials on patient's individual exercise prescription       Expected Outcomes Short Term: Able to explain program exercise prescription;Long Term: Able to explain home exercise prescription to exercise independently          Exercise Goals Re-Evaluation :   Discharge Exercise Prescription (Final Exercise Prescription Changes):     Exercise Prescription  Changes - 12/07/16 1500      Response to Exercise   Blood Pressure (Admit) 136/72   Blood Pressure (Exercise) 138/74   Blood Pressure (Exit) 124/70   Heart Rate (Admit) 64 bpm   Heart Rate (Exercise) 100 bpm   Heart Rate (Exit) 67 bpm   Oxygen Saturation (Admit) 96 %   Oxygen Saturation (Exercise) 95 %   Rating of Perceived Exertion (Exercise) 11   Symptoms none   Comments walk test results      Nutrition:  Target Goals: Understanding of nutrition guidelines, daily intake of sodium 1500mg , cholesterol 200mg , calories 30% from fat and 7% or less from saturated fats, daily to have 5 or more servings of fruits and vegetables.  Biometrics:     Pre Biometrics - 12/07/16 1511      Pre Biometrics   Height 5' 8.9" (1.75 m)   Weight 182 lb 1.6 oz (82.6 kg)   Waist Circumference 40.5 inches   Hip Circumference 40.75 inches   Waist to Hip Ratio 0.99 %   BMI (Calculated) 26.97   Single Leg Stand 2.03 seconds       Nutrition Therapy Plan and Nutrition Goals:     Nutrition Therapy & Goals - 12/07/16 1213      Nutrition Therapy   Drug/Food Interactions Statins/Certain Fruits      Nutrition Discharge: Rate Your Plate Scores:     Nutrition Assessments - 12/07/16 1213      MEDFICTS Scores   Pre Score 98      Nutrition Goals Re-Evaluation:   Nutrition Goals Discharge (Final Nutrition Goals Re-Evaluation):   Psychosocial: Target Goals: Acknowledge presence or absence of significant depression and/or stress, maximize coping skills, provide positive support system. Participant is able to verbalize types and ability to use techniques and skills needed for reducing stress and depression.   Initial Review & Psychosocial Screening:     Initial Psych Review & Screening - 12/07/16 1217      Initial Review   Current issues with Current Stress Concerns   Source of Stress Concerns Chronic Illness;Unable to perform yard/household activities     [place]? Yes     Barriers   Psychosocial barriers to participate in program The patient should benefit from training in stress management and relaxation.     Screening Interventions   Interventions Encouraged to exercise;Yes   Expected Outcomes Short Term goal: Utilizing psychosocial counselor, staff and physician to assist with identification of specific Stressors or current issues interfering with healing process. Setting desired goal for each stressor or current issue identified.;Long Term Goal: Stressors or current issues are controlled or eliminated.;Short Term goal: Identification and review with participant of any Quality of Life or Depression concerns found by scoring the questionnaire.;Long Term goal: The participant improves quality of Life and PHQ9 Scores as seen by post scores and/or verbalization of changes      Quality of  Life Scores:      Quality of Life - 12/07/16 1216      Quality of Life Scores   Health/Function Pre 18.6 %   Socioeconomic Pre 27.67 %   Psych/Spiritual Pre 20.14 %   Family Pre 28.8 %   GLOBAL Pre 22.12 %      PHQ-9: Recent Review Flowsheet Data    Depression screen [facility] 2/9 12/07/2016   Decreased Interest 1   Down, Depressed, Hopeless 1   PHQ - 2 Score 2   Altered sleeping 0   Tired, decreased energy 3   Change in appetite 0   Feeling bad or failure about yourself  0   Trouble concentrating 2   Moving slowly or fidgety/restless 0   Suicidal thoughts 0   PHQ-9 Score 7   Difficult doing work/chores Not difficult at all     Interpretation of Total Score  Total Score Depression Severity:  1-4 = Minimal depression, 5-9 = Mild depression, 10-14 = Moderate depression, 15-19 = Moderately severe depression, 20-27 = Severe depression   Psychosocial Evaluation and Intervention:   Psychosocial Re-Evaluation:   Psychosocial Discharge (Final Psychosocial Re-Evaluation):   Vocational Rehabilitation: Provide vocational rehab assistance to  qualifying candidates.   Vocational Rehab Evaluation & Intervention:     Vocational Rehab - 12/07/16 1215      Initial Vocational Rehab Evaluation & Intervention   Assessment shows need for Vocational Rehabilitation No      Education: Education Goals: Education classes will be provided on a variety of topics geared toward better understanding of heart health and risk factor modification. Participant will state understanding/return demonstration of topics presented as noted by education test scores.  Learning Barriers/Preferences:     Learning Barriers/Preferences - 12/07/16 1215      Learning Barriers/Preferences   Learning Barriers Hearing   Learning Preferences Video;Written Material      Education Topics: General Nutrition Guidelines/Fats and Fiber: -Group instruction provided by verbal, written material, models and posters to present the general guidelines for heart healthy nutrition. Gives an explanation and review of dietary fats and fiber.   Controlling Sodium/Reading Food Labels: -Group verbal and written material supporting the discussion of sodium use in heart healthy nutrition. Review and explanation with models, verbal and written materials for utilization of the food label.   Exercise Physiology & Risk Factors: - Group verbal and written instruction with models to review the exercise physiology of the cardiovascular system and associated critical values. Details cardiovascular disease risk factors and the goals associated with each risk factor.   Aerobic Exercise & Resistance Training: - Gives group verbal and written discussion on the health impact of inactivity. On the components of aerobic and resistive training programs and the benefits of this training and how to safely progress through these programs.   Flexibility, Balance, General Exercise Guidelines: - Provides group verbal and written instruction on the benefits of flexibility and balance training  programs. Provides general exercise guidelines with specific guidelines to those with heart or lung disease. Demonstration and skill practice provided.   Stress Management: - Provides group verbal and written instruction about the health risks of elevated stress, cause of high stress, and healthy ways to reduce stress.   Depression: - Provides group verbal and written instruction on the correlation between heart/lung disease and depressed mood, treatment options, and the stigmas associated with seeking treatment.   Anatomy & Physiology of the Heart: - Group verbal and written instruction and models provide basic cardiac anatomy and  physiology, with the coronary electrical and arterial systems. Review of: AMI, Angina, Valve disease, Heart Failure, Cardiac Arrhythmia, Pacemakers, and the ICD.   Cardiac Procedures: - Group verbal and written instruction to review commonly prescribed medications for heart disease. Reviews the medication, class of the drug, and side effects. Includes the steps to properly store meds and maintain the prescription regimen. (beta blockers and nitrates)   Cardiac Medications I: - Group verbal and written instruction to review commonly prescribed medications for heart disease. Reviews the medication, class of the drug, and side effects. Includes the steps to properly store meds and maintain the prescription regimen.   Cardiac Medications II: -Group verbal and written instruction to review commonly prescribed medications for heart disease. Reviews the medication, class of the drug, and side effects. (all other drug classes)    Go Sex-Intimacy & Heart Disease, Get SMART - Goal Setting: - Group verbal and written instruction through game format to discuss heart disease and the return to sexual intimacy. Provides group verbal and written material to discuss and apply goal setting through the application of the S.M.A.R.T. Method.   Other Matters of the Heart: -  Provides group verbal, written materials and models to describe Heart Failure, Angina, Valve Disease, Peripheral Artery Disease, and Diabetes in the realm of heart disease. Includes description of the disease process and treatment options available to the cardiac patient.   Exercise & Equipment Safety: - Individual verbal instruction and demonstration of equipment use and safety with use of the equipment.   Cardiac Rehab from 12/07/2016 in [facility] Cardiac and Pulmonary Rehab  Date  12/07/16  Educator  C. EnterkinRN  Instruction Review Code  3- Needs Reinforcement      Infection Prevention: - Provides verbal and written material to individual with discussion of infection control including proper hand washing and proper equipment cleaning during exercise session.   Cardiac Rehab from 12/07/2016 in [facility] Cardiac and Pulmonary Rehab  Date  12/07/16  Educator  C. EnterkinRN  Instruction Review Code  1- Verbalizes Understanding      Falls Prevention: - Provides verbal and written material to individual with discussion of falls prevention and safety.   Cardiac Rehab from 12/07/2016 in [facility] Cardiac and Pulmonary Rehab  Date  12/07/16  Educator  C. ENterkinRN  Instruction Review Code  1- Verbalizes Understanding      Diabetes: - Individual verbal and written instruction to review signs/symptoms of diabetes, desired ranges of glucose level fasting, after meals and with exercise. Acknowledge that pre and post exercise glucose checks will be done for 3 sessions at entry of program.   Other: -Provides group and verbal instruction on various topics (see comments)    Knowledge Questionnaire Score:     Knowledge Questionnaire Score - 12/07/16 1215      Knowledge Questionnaire Score   Pre Score 28/28      Core Components/Risk Factors/Patient Goals at Admission:     Personal Goals and Risk Factors at Admission - 12/07/16 1216      Core Components/Risk Factors/Patient Goals on Admission     Weight Management Yes;Weight Loss   Intervention Weight Management: Develop a combined nutrition and exercise program designed to reach desired caloric intake, while maintaining appropriate intake of nutrient and fiber, sodium and fats, and appropriate energy expenditure required for the weight goal.;Weight Management: Provide education and appropriate resources to help participant work on and attain dietary goals.   Admit Weight 182 lb 1.6 oz (82.6 kg)   Goal Weight: Short  Term 180 lb (81.6 kg)   Goal Weight: Long Term 172 lb (78 kg)   Expected Outcomes Short Term: Continue to assess and modify interventions until short term weight is achieved;Long Term: Adherence to nutrition and physical activity/exercise program aimed toward attainment of established weight goal;Weight Loss: Understanding of general recommendations for a balanced deficit meal plan, which promotes 1-2 lb weight loss per week and includes a negative energy balance of [phone] kcal/d;Understanding recommendations for meals to include 15-35% energy as protein, 25-35% energy from fat, 35-60% energy from carbohydrates, less than 200mg  of dietary cholesterol, 20-35 gm of total fiber daily;Understanding of distribution of calorie intake throughout the day with the consumption of 4-5 meals/snacks   Hypertension Yes   Intervention Provide education on lifestyle modifcations including regular physical activity/exercise, weight management, moderate sodium restriction and increased consumption of fresh fruit, vegetables, and low fat dairy, alcohol moderation, and smoking cessation.;Monitor prescription use compliance.   Expected Outcomes Short Term: Continued assessment and intervention until BP is < 140/76mm HG in hypertensive participants. < 130/37mm HG in hypertensive participants with diabetes, heart failure or chronic kidney disease.;Long Term: Maintenance of blood pressure at goal levels.   Lipids Yes   Intervention Provide education and  support for participant on nutrition & aerobic/resistive exercise along with prescribed medications to achieve LDL 70mg , HDL >40mg .   Expected Outcomes Short Term: Participant states understanding of desired cholesterol values and is compliant with medications prescribed. Participant is following exercise prescription and nutrition guidelines.;Long Term: Cholesterol controlled with medications as prescribed, with individualized exercise RX and with personalized nutrition plan. Value goals: LDL < 70mg , HDL > 40 mg.      Core Components/Risk Factors/Patient Goals Review:    Core Components/Risk Factors/Patient Goals at Discharge (Final Review):    ITP Comments:     ITP Comments    Row Name 12/07/16 1223 12/07/16 1227 12/09/16 1144       ITP Comments ITP created during Medical Review after Cardiac Rehab informed consent was signed.  Smitty Knudsen, M.D. 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  30 day review. Continue with ITP unless directed changes per Medical Director review.          Comments:

## 2016-12-10 ENCOUNTER — Encounter: Payer: Medicare Other | Admitting: *Deleted

## 2016-12-10 DIAGNOSIS — I214 Non-ST elevation (NSTEMI) myocardial infarction: Secondary | ICD-10-CM

## 2016-12-10 DIAGNOSIS — I213 ST elevation (STEMI) myocardial infarction of unspecified site: Secondary | ICD-10-CM | POA: Diagnosis not present

## 2016-12-10 DIAGNOSIS — Z955 Presence of coronary angioplasty implant and graft: Secondary | ICD-10-CM

## 2016-12-10 NOTE — Progress Notes (Signed)
Daily Session Note  Patient Details  Name: RUSTON FEDORA MRN: [id number] Date of Birth: [birth year] Referring Provider:     Cardiac Rehab from 12/07/2016 in [facility] Cardiac and Pulmonary Rehab  Referring Provider  Bartholome Bill MD      Encounter Date: 12/10/2016  Check In:     Session Check In - 12/10/16 1704      Check-In   Location [facility]-Cardiac & Pulmonary Rehab   Staff Present Earlean Shawl, BS, ACSM CEP, Exercise Physiologist;Joseph Tessie Fass RCP,RRT,BSRT;Carroll Enterkin, RN, BSN   Supervising physician immediately available to respond to emergencies See telemetry face sheet for immediately available ER MD   Medication changes reported     No   Fall or balance concerns reported    No   Warm-up and Cool-down Performed on first and last piece of equipment   Resistance Training Performed Yes   VAD Patient? No     Pain Assessment   Currently in Pain? No/denies   Multiple Pain Sites No         History  Smoking Status  . Never Smoker  Smokeless Tobacco  . Never Used    Goals Met:  Independence with exercise equipment Exercise tolerated well No report of cardiac concerns or symptoms Strength training completed today  Goals Unmet:  Not Applicable  Comments: Pt able to follow exercise prescription today without complaint.  Will continue to monitor for progression.    Dr. Emily Filbert is Medical Director for [place] and [facility].

## 2016-12-14 DIAGNOSIS — Z955 Presence of coronary angioplasty implant and graft: Secondary | ICD-10-CM

## 2016-12-14 DIAGNOSIS — I213 ST elevation (STEMI) myocardial infarction of unspecified site: Secondary | ICD-10-CM

## 2016-12-14 DIAGNOSIS — I214 Non-ST elevation (NSTEMI) myocardial infarction: Secondary | ICD-10-CM

## 2016-12-14 NOTE — Progress Notes (Signed)
Daily Session Note  Patient Details  Name: Donald Riley MRN: [id number] Date of Birth: [birth year] Referring Provider:     Cardiac Rehab from 12/07/2016 in [facility] Cardiac and Pulmonary Rehab  Referring Provider  Bartholome Bill MD      Encounter Date: 12/14/2016  Check In:     Session Check In - 12/14/16 1725      Check-In   Location [facility]-Cardiac & Pulmonary Rehab   Staff Present Nada Maclachlan, BA, ACSM CEP, Exercise Physiologist;Kelly Amedeo Plenty, BS, ACSM CEP, Exercise Physiologist;Meredith Sherryll Burger, RN BSN   Supervising physician immediately available to respond to emergencies See telemetry face sheet for immediately available ER MD   Medication changes reported     No   Fall or balance concerns reported    No   Warm-up and Cool-down Performed on first and last piece of equipment   Resistance Training Performed Yes   VAD Patient? No     Pain Assessment   Currently in Pain? No/denies         History  Smoking Status  . Never Smoker  Smokeless Tobacco  . Never Used    Goals Met:  Independence with exercise equipment Exercise tolerated well No report of cardiac concerns or symptoms Strength training completed today  Goals Unmet:  Not Applicable  Comments: Pt able to follow exercise prescription today without complaint.  Will continue to monitor for progression.    Dr. Emily Filbert is Medical Director for [place] and [facility].

## 2016-12-17 DIAGNOSIS — I213 ST elevation (STEMI) myocardial infarction of unspecified site: Secondary | ICD-10-CM

## 2016-12-17 NOTE — Progress Notes (Signed)
Daily Session Note  Patient Details  Name: Donald Riley MRN: [id number] Date of Birth: June 09, 1933 Referring Provider:     Cardiac Rehab from 12/07/2016 in [facility] Cardiac and Pulmonary Rehab  Referring Provider  Bartholome Bill MD      Encounter Date: 12/17/2016  Check In:     Session Check In - 12/17/16 1617      Check-In   Location [facility]-Cardiac & Pulmonary Rehab   Staff Present Gerlene Burdock, RN, Vickki Hearing, BA, ACSM CEP, Exercise Physiologist;Kelly Amedeo Plenty, BS, ACSM CEP, Exercise Physiologist;Justise Ehmann Flavia Shipper   Supervising physician immediately available to respond to emergencies See telemetry face sheet for immediately available ER MD   Medication changes reported     No   Fall or balance concerns reported    No   Warm-up and Cool-down Performed on first and last piece of equipment   Resistance Training Performed Yes   VAD Patient? No     Pain Assessment   Currently in Pain? No/denies   Multiple Pain Sites No         History  Smoking Status  . Never Smoker  Smokeless Tobacco  . Never Used    Goals Met:  Independence with exercise equipment Exercise tolerated well No report of cardiac concerns or symptoms Strength training completed today  Goals Unmet:  Not Applicable  Comments: Pt able to follow exercise prescription today without complaint.  Will continue to monitor for progression.   Dr. Emily Filbert is Medical Director for [place] and [facility].

## 2016-12-21 DIAGNOSIS — I213 ST elevation (STEMI) myocardial infarction of unspecified site: Secondary | ICD-10-CM | POA: Diagnosis not present

## 2016-12-21 DIAGNOSIS — Z955 Presence of coronary angioplasty implant and graft: Secondary | ICD-10-CM

## 2016-12-21 DIAGNOSIS — I214 Non-ST elevation (NSTEMI) myocardial infarction: Secondary | ICD-10-CM

## 2016-12-21 NOTE — Progress Notes (Signed)
Daily Session Note  Patient Details  Name: Donald Riley MRN: [id number] Date of Birth: 1933-08-12 Referring Provider:     Cardiac Rehab from 12/07/2016 in [facility] Cardiac and Pulmonary Rehab  Referring Provider  Bartholome Bill MD      Encounter Date: 12/21/2016  Check In:     Session Check In - 12/21/16 1741      Check-In   Location [facility]-Cardiac & Pulmonary Rehab   Staff Present Nada Maclachlan, BA, ACSM CEP, Exercise Physiologist;Kelly Amedeo Plenty, BS, ACSM CEP, Exercise Physiologist;Meredith Sherryll Burger, RN BSN   Supervising physician immediately available to respond to emergencies See telemetry face sheet for immediately available ER MD   Medication changes reported     No   Fall or balance concerns reported    No   Warm-up and Cool-down Performed on first and last piece of equipment   Resistance Training Performed Yes   VAD Patient? No     Pain Assessment   Currently in Pain? No/denies         History  Smoking Status  . Never Smoker  Smokeless Tobacco  . Never Used    Goals Met:  Independence with exercise equipment Exercise tolerated well No report of cardiac concerns or symptoms Strength training completed today  Goals Unmet:  Not Applicable  Comments: Pt able to follow exercise prescription today without complaint.  Will continue to monitor for progression.    Dr. Emily Filbert is Medical Director for [place] and [facility].

## 2016-12-23 ENCOUNTER — Encounter: Payer: Medicare Other | Admitting: *Deleted

## 2016-12-23 DIAGNOSIS — I213 ST elevation (STEMI) myocardial infarction of unspecified site: Secondary | ICD-10-CM

## 2016-12-23 DIAGNOSIS — Z955 Presence of coronary angioplasty implant and graft: Secondary | ICD-10-CM

## 2016-12-23 NOTE — Progress Notes (Signed)
Daily Session Note  Patient Details  Name: Donald Riley MRN: [id number] Date of Birth: 04-29-1933 Referring Provider:     Cardiac Rehab from 12/07/2016 in [facility] Cardiac and Pulmonary Rehab  Referring Provider  Bartholome Bill MD      Encounter Date: 12/23/2016  Check In:     Session Check In - 12/23/16 1705      Check-In   Location [facility]-Cardiac & Pulmonary Rehab   Staff Present Renita Papa, RN Vickki Hearing, BA, ACSM CEP, Exercise Physiologist;Carroll Enterkin, RN, BSN   Supervising physician immediately available to respond to emergencies See telemetry face sheet for immediately available ER MD   Medication changes reported     No   Fall or balance concerns reported    No   Warm-up and Cool-down Performed on first and last piece of equipment   Resistance Training Performed Yes   VAD Patient? No     Pain Assessment   Currently in Pain? No/denies         History  Smoking Status  . Never Smoker  Smokeless Tobacco  . Never Used    Goals Met:  Proper associated with RPD/PD & O2 Sat Independence with exercise equipment Exercise tolerated well No report of cardiac concerns or symptoms Strength training completed today  Goals Unmet:  Not Applicable  Comments: Pt able to follow exercise prescription today without complaint.  Will continue to monitor for progression.    Dr. Emily Filbert is Medical Director for [place] and [facility].

## 2016-12-24 ENCOUNTER — Encounter: Payer: Medicare Other | Admitting: *Deleted

## 2016-12-24 DIAGNOSIS — I214 Non-ST elevation (NSTEMI) myocardial infarction: Secondary | ICD-10-CM

## 2016-12-24 DIAGNOSIS — I213 ST elevation (STEMI) myocardial infarction of unspecified site: Secondary | ICD-10-CM | POA: Diagnosis not present

## 2016-12-24 DIAGNOSIS — Z955 Presence of coronary angioplasty implant and graft: Secondary | ICD-10-CM

## 2016-12-24 NOTE — Progress Notes (Signed)
Daily Session Note  Patient Details  Name: Donald Riley MRN: [id number] Date of Birth: [birth year] Referring Provider:     Cardiac Rehab from 12/07/2016 in [facility] Cardiac and Pulmonary Rehab  Referring Provider  Bartholome Bill MD      Encounter Date: 12/24/2016  Check In:     Session Check In - 12/24/16 1722      Check-In   Location [facility]-Cardiac & Pulmonary Rehab   Staff Present Earlean Shawl, BS, ACSM CEP, Exercise Physiologist;Joseph Tessie Fass RCP,RRT,BSRT;Carroll Enterkin, RN, BSN   Supervising physician immediately available to respond to emergencies See telemetry face sheet for immediately available ER MD   Medication changes reported     No   Fall or balance concerns reported    No   Warm-up and Cool-down Performed on first and last piece of equipment   Resistance Training Performed Yes   VAD Patient? No     Pain Assessment   Currently in Pain? No/denies   Multiple Pain Sites No         History  Smoking Status  . Never Smoker  Smokeless Tobacco  . Never Used    Goals Met:  Independence with exercise equipment Exercise tolerated well No report of cardiac concerns or symptoms Strength training completed today  Goals Unmet:  Not Applicable  Comments: Pt able to follow exercise prescription today without complaint.  Will continue to monitor for progression.    Dr. Emily Filbert is Medical Director for [place] and [facility].

## 2016-12-28 ENCOUNTER — Encounter: Payer: Medicare Other | Attending: Cardiovascular Disease

## 2016-12-28 DIAGNOSIS — Z955 Presence of coronary angioplasty implant and graft: Secondary | ICD-10-CM | POA: Diagnosis not present

## 2016-12-28 DIAGNOSIS — Z48812 Encounter for surgical aftercare following surgery on the circulatory system: Secondary | ICD-10-CM | POA: Diagnosis not present

## 2016-12-28 DIAGNOSIS — I213 ST elevation (STEMI) myocardial infarction of unspecified site: Secondary | ICD-10-CM | POA: Diagnosis present

## 2016-12-28 DIAGNOSIS — I214 Non-ST elevation (NSTEMI) myocardial infarction: Secondary | ICD-10-CM

## 2016-12-28 NOTE — Progress Notes (Signed)
Daily Session Note  Patient Details  Name: Donald Riley MRN: [id number] Date of Birth: [birth year] Referring Provider:     Cardiac Rehab from 12/07/2016 in [facility] Cardiac and Pulmonary Rehab  Referring Provider  Bartholome Bill MD      Encounter Date: 12/28/2016  Check In:     Session Check In - 12/28/16 1742      Check-In   Location [facility]-Cardiac & Pulmonary Rehab   Staff Present Nyoka Cowden, RN, BSN, Kela Millin, BA, ACSM CEP, Exercise Physiologist;Kelly Amedeo Plenty, BS, ACSM CEP, Exercise Physiologist   Supervising physician immediately available to respond to emergencies See telemetry face sheet for immediately available ER MD   Medication changes reported     No   Fall or balance concerns reported    No   Warm-up and Cool-down Performed on first and last piece of equipment   Resistance Training Performed Yes   VAD Patient? No     Pain Assessment   Currently in Pain? No/denies         History  Smoking Status  . Never Smoker  Smokeless Tobacco  . Never Used    Goals Met:  Independence with exercise equipment Exercise tolerated well No report of cardiac concerns or symptoms Strength training completed today  Goals Unmet:  Not Applicable  Comments: Pt able to follow exercise prescription today without complaint.  Will continue to monitor for progression.    Dr. Emily Filbert is Medical Director for [place] and [facility].

## 2016-12-31 DIAGNOSIS — I213 ST elevation (STEMI) myocardial infarction of unspecified site: Secondary | ICD-10-CM

## 2016-12-31 DIAGNOSIS — Z955 Presence of coronary angioplasty implant and graft: Secondary | ICD-10-CM

## 2016-12-31 NOTE — Progress Notes (Signed)
Daily Session Note  Patient Details  Name: JERRIS FLEER MRN: [id number] Date of Birth: 1933-06-12 Referring Provider:     Cardiac Rehab from 12/07/2016 in [facility] Cardiac and Pulmonary Rehab  Referring Provider  Bartholome Bill MD      Encounter Date: 12/31/2016  Check In:     Session Check In - 12/31/16 1629      Check-In   Location [facility]-Cardiac & Pulmonary Rehab   Staff Present Gerlene Burdock, RN, Moises Blood, BS, ACSM CEP, Exercise Physiologist;Joseph Flavia Shipper   Supervising physician immediately available to respond to emergencies See telemetry face sheet for immediately available ER MD   Medication changes reported     No   Fall or balance concerns reported    No   Warm-up and Cool-down Performed on first and last piece of equipment   Resistance Training Performed Yes   VAD Patient? No     Pain Assessment   Currently in Pain? No/denies         History  Smoking Status  . Never Smoker  Smokeless Tobacco  . Never Used    Goals Met:  Independence with exercise equipment Exercise tolerated well Personal goals reviewed No report of cardiac concerns or symptoms Strength training completed today  Goals Unmet:  Not Applicable  Comments: Reviewed home exercise with pt today.  Pt plans to [place].  Reviewed THR, pulse, RPE, sign and symptoms, NTG use, and when to call 911 or MD.  Also discussed weather considerations and indoor options.  Pt voiced understanding.    Dr. Emily Filbert is Medical Director for [place] and [facility].

## 2016-12-31 NOTE — Progress Notes (Signed)
Daily Session Note  Patient Details  Name: JENCARLO BONADONNA MRN: [id number] Date of Birth: 02/19/1934 Referring Provider:     Cardiac Rehab from 12/07/2016 in [facility] Cardiac and Pulmonary Rehab  Referring Provider  Bartholome Bill MD      Encounter Date: 12/31/2016  Check In:     Session Check In - 12/31/16 1629      Check-In   Location [facility]-Cardiac & Pulmonary Rehab   Staff Present Gerlene Burdock, RN, Moises Blood, BS, ACSM CEP, Exercise Physiologist;Kyal Arts Flavia Shipper   Supervising physician immediately available to respond to emergencies See telemetry face sheet for immediately available ER MD   Medication changes reported     No   Fall or balance concerns reported    No   Warm-up and Cool-down Performed on first and last piece of equipment   Resistance Training Performed Yes   VAD Patient? No     Pain Assessment   Currently in Pain? No/denies         History  Smoking Status  . Never Smoker  Smokeless Tobacco  . Never Used    Goals Met:  Independence with exercise equipment Exercise tolerated well Strength training completed today  Goals Unmet:  Not Applicable  Comments: Pt able to follow exercise prescription today without complaint.  Will continue to monitor for progression.   Dr. Emily Filbert is Medical Director for [place] and [facility].

## 2017-01-04 DIAGNOSIS — I213 ST elevation (STEMI) myocardial infarction of unspecified site: Secondary | ICD-10-CM

## 2017-01-04 DIAGNOSIS — Z955 Presence of coronary angioplasty implant and graft: Secondary | ICD-10-CM

## 2017-01-04 DIAGNOSIS — I214 Non-ST elevation (NSTEMI) myocardial infarction: Secondary | ICD-10-CM

## 2017-01-04 NOTE — Progress Notes (Signed)
Daily Session Note  Patient Details  Name: Donald Riley MRN: [id number] Date of Birth: 1934/03/10 Referring Provider:     Cardiac Rehab from 12/07/2016 in [facility] Cardiac and Pulmonary Rehab  Referring Provider  Bartholome Bill MD      Encounter Date: 01/04/2017  Check In:     Session Check In - 01/04/17 1703      Check-In   Location [facility]-Cardiac & Pulmonary Rehab   Staff Present Nada Maclachlan, BA, ACSM CEP, Exercise Physiologist;Kelly Amedeo Plenty, BS, ACSM CEP, Exercise Physiologist;Meredith Sherryll Burger, RN BSN   Supervising physician immediately available to respond to emergencies See telemetry face sheet for immediately available ER MD   Medication changes reported     No   Fall or balance concerns reported    No   Warm-up and Cool-down Performed on first and last piece of equipment   Resistance Training Performed Yes   VAD Patient? No     Pain Assessment   Currently in Pain? No/denies         History  Smoking Status  . Never Smoker  Smokeless Tobacco  . Never Used    Goals Met:  Independence with exercise equipment Exercise tolerated well No report of cardiac concerns or symptoms Strength training completed today  Goals Unmet:  Not Applicable  Comments: Pt able to follow exercise prescription today without complaint.  Will continue to monitor for progression.    Dr. Emily Filbert is Medical Director for [place] and [facility].

## 2017-01-06 ENCOUNTER — Encounter: Payer: Self-pay | Admitting: *Deleted

## 2017-01-06 ENCOUNTER — Encounter: Payer: Medicare Other | Admitting: *Deleted

## 2017-01-06 DIAGNOSIS — I213 ST elevation (STEMI) myocardial infarction of unspecified site: Secondary | ICD-10-CM | POA: Diagnosis not present

## 2017-01-06 DIAGNOSIS — Z955 Presence of coronary angioplasty implant and graft: Secondary | ICD-10-CM

## 2017-01-06 NOTE — Progress Notes (Signed)
Daily Session Note  Patient Details  Name: AVON MERGENTHALER MRN: [id number] Date of Birth: 1933-09-28 Referring Provider:     Cardiac Rehab from 12/07/2016 in [facility] Cardiac and Pulmonary Rehab  Referring Provider  Bartholome Bill MD      Encounter Date: 01/06/2017  Check In:     Session Check In - 01/06/17 1624      Check-In   Location [facility]-Cardiac & Pulmonary Rehab   Staff Present Renita Papa, RN Vickki Hearing, BA, ACSM CEP, Exercise Physiologist;Carroll Enterkin, RN, BSN   Supervising physician immediately available to respond to emergencies See telemetry face sheet for immediately available ER MD   Medication changes reported     No   Fall or balance concerns reported    No   Warm-up and Cool-down Performed on first and last piece of equipment   Resistance Training Performed Yes   VAD Patient? No     Pain Assessment   Currently in Pain? No/denies         History  Smoking Status  . Never Smoker  Smokeless Tobacco  . Never Used    Goals Met:  Independence with exercise equipment Exercise tolerated well No report of cardiac concerns or symptoms Strength training completed today  Goals Unmet:  Not Applicable  Comments: Pt able to follow exercise prescription today without complaint.  Will continue to monitor for progression.    Dr. Emily Filbert is Medical Director for [place] and [facility].

## 2017-01-06 NOTE — Progress Notes (Signed)
Cardiac Individual Treatment Plan  Patient Details  Name: Donald Riley MRN: [id number] Date of Birth: Feb 21, 1941 Referring Provider:     Cardiac Rehab from 12/07/2017 in [facility] Cardiac and Pulmonary Rehab  Referring Provider  Bartholome Bill MD      Initial Encounter Date:    Cardiac Rehab from 12/07/2017 in [facility] Cardiac and Pulmonary Rehab  Date  12/07/16  Referring Provider  Bartholome Bill MD      Visit Diagnosis: No diagnosis found.  Patient's Home Medications on Admission:  Current Outpatient Prescriptions:  .  albuterol (VENTOLIN HFA) 108 (90 BASE) MCG/ACT inhaler, Inhale into the lungs every 4 (four) hours as needed. , Disp: , Rfl:  .  amiodarone (PACERONE) 200 MG tablet, , Disp: , Rfl:  .  aspirin 81 MG chewable tablet, Chew 1 tablet (81 mg total) by mouth daily., Disp: 30 tablet, Rfl: 2 .  atorvastatin (LIPITOR) 40 MG tablet, Take 1 tablet (40 mg total) by mouth daily at 6 PM., Disp: 30 tablet, Rfl: 2 .  brimonidine (ALPHAGAN P) 0.1 % SOLN, Alphagan P 0.1 % eye drops, Disp: , Rfl:  .  clopidogrel (PLAVIX) 75 MG tablet, Take 1 tablet (75 mg total) by mouth daily with breakfast., Disp: 30 tablet, Rfl: 2 .  diltiazem (CARDIZEM CD) 120 MG 24 hr capsule, Take 120 mg by mouth., Disp: , Rfl:  .  dorzolamide-timolol (COSOPT) 22.3-6.8 MG/ML ophthalmic solution, , Disp: , Rfl:  .  EPINEPHrine 0.3 mg/0.3 mL IJ SOAJ injection, Inject 0.3 mLs (0.3 mg total) into the muscle once as needed., Disp: 6 Device, Rfl: 0 .  ferrous sulfate 325 (65 FE) MG tablet, Take 1 tablet (325 mg total) by mouth daily with breakfast., Disp: 30 tablet, Rfl: 2 .  furosemide (LASIX) 20 MG tablet, Take 1 tablet (20 mg total) by mouth daily., Disp: 30 tablet, Rfl: 2 .  latanoprost (XALATAN) 0.005 % ophthalmic solution, Place 1 drop into both eyes at bedtime. , Disp: , Rfl:  .  metoprolol succinate (TOPROL-XL) 25 MG 24 hr tablet, metoprolol succinate ER 25 mg tablet,extended release 24 hr  Take 1 tablet every day by oral  route., Disp: , Rfl:  .  mometasone-formoterol (DULERA) 200-5 MCG/ACT AERO, Dulera 200 mcg-5 mcg/actuation HFA aerosol inhaler, Disp: , Rfl:  .  MULTIPLE VITAMIN PO, Take by mouth., Disp: , Rfl:  .  oxyCODONE-acetaminophen (PERCOCET/ROXICET) 5-325 MG tablet, Take 1 tablet by mouth every 6 (six) hours as needed for moderate pain or severe pain (pain). (Patient not taking: Reported on 12/07/2016), Disp: 12 tablet, Rfl: 0 .  pantoprazole (PROTONIX) 40 MG tablet, Take 40 mg by mouth daily. Reported on 07/02/2015, Disp: , Rfl:  .  pramipexole (MIRAPEX) 0.25 MG tablet, Take 0.25-0.5 mg by mouth 2 (two) times daily. , Disp: , Rfl:  .  predniSONE (DELTASONE) 20 MG tablet, , Disp: , Rfl:  .  tiotropium (SPIRIVA HANDIHALER) 18 MCG inhalation capsule, Place 18 mcg into inhaler and inhale daily. , Disp: , Rfl:   Past Medical History: Past Medical History:  Diagnosis Date  . Arthritis   . Asthma   . Atrial fibrillation ([place])   . Cancer ([place])   . GERD (gastroesophageal reflux disease)   . Glaucoma   . Gout   . Heart murmur   . Hypertension   . Neuropathy   . Osteoporosis   . Sleep apnea     Tobacco Use: History  Smoking Status  . Never Smoker  Smokeless Tobacco  .  Never Used    Labs: Recent Review Flowsheet Data    Labs for ITP Cardiac and Pulmonary Rehab Latest Ref Rng & Units 10/01/2016 10/01/2016 10/01/2016   Cholestrol 0 - 200 mg/dL - - 201(H)   LDLCALC 0 - 99 mg/dL - - 105(H)   HDL >40 mg/dL - - 82   Trlycerides <150 mg/dL - 87 68   PHART 7.350 - 7.450 7.31(L) - -   PCO2ART 32.0 - 48.0 mmHg 43 - -   HCO3 20.0 - 28.0 mmol/L 21.7 - -   ACIDBASEDEF 0.0 - 2.0 mmol/L 4.6(H) - -   O2SAT % 99.5 - -       Exercise Target Goals:    Exercise Program Goal: Individual exercise prescription set with THRR, safety & activity barriers. Participant demonstrates ability to understand and report RPE using BORG scale, to self-measure pulse accurately, and to acknowledge the importance of the exercise  prescription.  Exercise Prescription Goal: Starting with aerobic activity 30 plus minutes a day, 3 days per week for initial exercise prescription. Provide home exercise prescription and guidelines that participant acknowledges understanding prior to discharge.  Activity Barriers & Risk Stratification:     Activity Barriers & Cardiac Risk Stratification - 12/07/16 1224      Activity Barriers & Cardiac Risk Stratification   Activity Barriers Back Problems;Neck/Spine Problems;Joint Problems;Arthritis;Other (comment);Balance Concerns;Assistive Device;Deconditioning;Muscular Weakness   Comments Neuropathy   Cardiac Risk Stratification High      6 Minute Walk:     6 Minute Walk    Row Name 12/07/16 1506         6 Minute Walk   Phase Initial     Distance 560 feet     Walk Time 6 minutes     # of Rest Breaks 0     METS 1.12     RPE 11     VO2 Peak 3.94     Symptoms No     Resting HR 64 bpm     Resting BP 136/72     Resting Oxygen Saturation  96 %     Exercise Oxygen Saturation  during 6 min walk 95 %     Max Ex. HR 100 bpm     Max Ex. BP 138/74     2 Minute Post BP 124/70        Oxygen Initial Assessment:   Oxygen Re-Evaluation:   Oxygen Discharge (Final Oxygen Re-Evaluation):   Initial Exercise Prescription:     Initial Exercise Prescription - 12/07/16 1500      Date of Initial Exercise RX and Referring Provider   Date 12/07/16   Referring Provider Bartholome Bill MD     Treadmill   MPH 1   Grade 0   Minutes 15   METs 1.77     NuStep   Level 1   SPM 80   Minutes 15   METs 1.5     Biostep-RELP   Level 1   SPM 50   Minutes 15   METs 2     Prescription Details   Frequency (times per week) 3   Duration Progress to 45 minutes of aerobic exercise without signs/symptoms of physical distress     Intensity   THRR 40-80% of Max Heartrate 94-124   Ratings of Perceived Exertion 11-13   Perceived Dyspnea 0-4     Progression   Progression Continue  to progress workloads to maintain intensity without signs/symptoms of physical distress.     Resistance Training  Training Prescription Yes   Weight 3 lbs   Reps 10-15      Perform Capillary Blood Glucose checks as needed.  Exercise Prescription Changes:     Exercise Prescription Changes    Row Name 12/07/16 1500 12/16/16 1400 12/30/16 1500         Response to Exercise   Blood Pressure (Admit) 136/72 118/74 112/76     Blood Pressure (Exercise) 138/74 132/68 122/70     Blood Pressure (Exit) 124/70 110/64 130/68     Heart Rate (Admit) 64 bpm 77 bpm 82 bpm     Heart Rate (Exercise) 100 bpm 85 bpm 88 bpm     Heart Rate (Exit) 67 bpm 68 bpm 72 bpm     Oxygen Saturation (Admit) 96 %  -  -     Oxygen Saturation (Exercise) 95 %  -  -     Rating of Perceived Exertion (Exercise) '11 13 14     ' Symptoms none none none     Comments walk test results  -  -       Progression   Progression  - Continue to progress workloads to maintain intensity without signs/symptoms of physical distress. Continue to progress workloads to maintain intensity without signs/symptoms of physical distress.     Average METs  - 2 2       Resistance Training   Training Prescription  - Yes Yes     Weight  - 3 lbs 3 lb     Reps  - 10-15 10-15       NuStep   Level  - 1 4     SPM  - 80 80     Minutes  - 15 15     METs  - 2  -       Biostep-RELP   Level  - 1 1     SPM  - 50 50     Minutes  - 15 15     METs  - 2 2        Exercise Comments:     Exercise Comments    Row Name 12/09/16 1709 12/31/16 1653         Exercise Comments First full day of exercise!  Patient was oriented to gym and equipment including functions, settings, policies, and procedures.  Patient's individual exercise prescription and treatment plan were reviewed.  All starting workloads were established based on the results of the 6 minute walk test done at initial orientation visit.  The plan for exercise progression was also  introduced and progression will be customized based on patient's performance and goals. Reviewed home exercise with pt today.  [place].  Reviewed THR, pulse, RPE, sign and symptoms, NTG use, and when to call 911 or MD.  Also discussed weather considerations and indoor options.  Pt voiced understanding.         Exercise Goals and Review:     Exercise Goals    Row Name 12/07/16 1511             Exercise Goals   Increase Physical Activity Yes       Intervention Provide advice, education, support and counseling about physical activity/exercise needs.;Develop an individualized exercise prescription for aerobic and resistive training based on initial evaluation findings, risk stratification, comorbidities and participant's personal goals.       Expected Outcomes Achievement of increased cardiorespiratory fitness and enhanced flexibility, muscular endurance and strength  shown through measurements of functional capacity and personal statement of participant.       Increase Strength and Stamina Yes       Intervention Provide advice, education, support and counseling about physical activity/exercise needs.;Develop an individualized exercise prescription for aerobic and resistive training based on initial evaluation findings, risk stratification, comorbidities and participant's personal goals.       Expected Outcomes Achievement of increased cardiorespiratory fitness and enhanced flexibility, muscular endurance and strength shown through measurements of functional capacity and personal statement of participant.       Able to understand and use rate of perceived exertion (RPE) scale Yes       Intervention Provide education and explanation on how to use RPE scale       Expected Outcomes Short Term: Able to use RPE daily in rehab to express subjective intensity level;Long Term:  Able to use RPE to guide intensity level when exercising independently       Knowledge and  understanding of Target Heart Rate Range (THRR) Yes       Intervention Provide education and explanation of THRR including how the numbers were predicted and where they are located for reference       Expected Outcomes Short Term: Able to state/look up THRR;Long Term: Able to use THRR to govern intensity when exercising independently;Short Term: Able to use daily as guideline for intensity in rehab       Able to check pulse independently Yes       Intervention Provide education and demonstration on how to check pulse in carotid and radial arteries.;Review the importance of being able to check your own pulse for safety during independent exercise       Expected Outcomes Short Term: Able to explain why pulse checking is important during independent exercise;Long Term: Able to check pulse independently and accurately       Understanding of Exercise Prescription Yes       Intervention Provide education, explanation, and written materials on patient's individual exercise prescription       Expected Outcomes Short Term: Able to explain program exercise prescription;Long Term: Able to explain home exercise prescription to exercise independently          Exercise Goals Re-Evaluation :     Exercise Goals Re-Evaluation    Row Name 12/16/16 1501 12/30/16 1506 12/31/16 1653         Exercise Goal Re-Evaluation   Exercise Goals Review Increase Physical Activity;Increase Strength and Stamina Increase Physical Activity;Increase Strength and Stamina Increase Physical Activity;Increase Strength and Stamina;Able to understand and use rate of perceived exertion (RPE) scale;Knowledge and understanding of Target Heart Rate Range (THRR);Able to check pulse independently;Understanding of Exercise Prescription     Comments Sam has tolerated exercise well - his RPE is in target range. Sam is tolerating exercise well.  Staff wil encourage him to continue attending regularly. Reviewed home exercise with pt today.  Pt  plans to [place].  Reviewed THR, pulse, RPE, sign and symptoms, NTG use, and when to call 911 or MD.  Also discussed weather considerations and indoor options.  Pt voiced understanding.     Expected Outcomes Short - Sam will continue to attend on a regular basis.  Long - Sam will increase overall fitness. Short - Sam will continue to attend regularly.  Long - Sam will continue to build stamina. Short - Sam will add 1 day of exercise outside of class.  Long - Sam will exercise  on his own.        Discharge Exercise Prescription (Final Exercise Prescription Changes):     Exercise Prescription Changes - 12/30/16 1500      Response to Exercise   Blood Pressure (Admit) 112/76   Blood Pressure (Exercise) 122/70   Blood Pressure (Exit) 130/68   Heart Rate (Admit) 82 bpm   Heart Rate (Exercise) 88 bpm   Heart Rate (Exit) 72 bpm   Rating of Perceived Exertion (Exercise) 14   Symptoms none     Progression   Progression Continue to progress workloads to maintain intensity without signs/symptoms of physical distress.   Average METs 2     Resistance Training   Training Prescription Yes   Weight 3 lb   Reps 10-15     NuStep   Level 4   SPM 80   Minutes 15     Biostep-RELP   Level 1   SPM 50   Minutes 15   METs 2      Nutrition:  Target Goals: Understanding of nutrition guidelines, daily intake of sodium <1585m, cholesterol <2067m calories 30% from fat and 7% or less from saturated fats, daily to have 5 or more servings of fruits and vegetables.  Biometrics:     Pre Biometrics - 12/07/16 1511      Pre Biometrics   Height 5' 8.9" (1.75 m)   Weight 182 lb 1.6 oz (82.6 kg)   Waist Circumference 40.5 inches   Hip Circumference 40.75 inches   Waist to Hip Ratio 0.99 %   BMI (Calculated) 26.97   Single Leg Stand 2.03 seconds       Nutrition Therapy Plan and Nutrition Goals:     Nutrition Therapy & Goals - 12/07/16 1213      Nutrition Therapy    Drug/Food Interactions Statins/Certain Fruits      Nutrition Discharge: Rate Your Plate Scores:     Nutrition Assessments - 12/07/16 1213      MEDFICTS Scores   Pre Score 98      Nutrition Goals Re-Evaluation:     Nutrition Goals Re-Evaluation    [place] 12/31/16 1654             Goals   Comment Scheduling with RD - 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  RD will give suggestions for healthy diet based on all heallth factors for them to follow.  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  RD will give suggestions for healthy diet based on all heallth factors for them to follow.  Long - Sam will implement changes suggested by RD      Psychosocial: Target Goals: Acknowledge presence or absence of significant depression and/or stress, maximize coping skills, provide positive support system. Participant is able to verbalize types and ability to use techniques and skills needed for reducing stress and depression.   Initial Review & Psychosocial Screening:     Initial Psych Review & Screening - 12/07/16 1217      Initial Review   Current issues  with Current Stress Concerns   Source of Stress Concerns Chronic Illness;Unable to perform yard/household activities     [place]? Yes     Barriers   Psychosocial barriers to participate in program The patient should benefit from training in stress management and relaxation.     Screening Interventions   Interventions Encouraged to exercise;Yes   Expected Outcomes Short Term goal: Utilizing psychosocial counselor, staff and physician to assist with identification of  specific Stressors or current issues interfering with healing process. Setting desired goal for each stressor or current issue identified.;Long Term Goal: Stressors or current issues are controlled or eliminated.;Short Term goal: Identification and review with participant of any Quality of Life or Depression concerns found by scoring the questionnaire.;Long Term goal: The participant improves quality of Life and PHQ9 Scores as seen by post scores and/or verbalization of changes      Quality of Life Scores:      Quality of Life - 12/07/16 1216      Quality of Life Scores   Health/Function Pre 18.6 %   Socioeconomic Pre 27.67 %   Psych/Spiritual Pre 20.14 %   Family Pre 28.8 %   GLOBAL Pre 22.12 %      PHQ-9: Recent Review Flowsheet Data    Depression screen [facility] 2/9 12/07/2016   Decreased Interest 1   Down, Depressed, Hopeless 1   PHQ - 2 Score 2   Altered sleeping 0   Tired, decreased energy 3   Change in appetite 0   Feeling bad or failure about yourself  0   Trouble concentrating 2   Moving slowly or fidgety/restless 0   Suicidal thoughts 0   PHQ-9 Score 7   Difficult doing work/chores Not difficult at all     Interpretation of Total Score  Total Score Depression Severity:  1-4 = Minimal depression, 5-9 = Mild depression, 10-14 = Moderate depression, 15-19 = Moderately severe depression, 20-27 = Severe depression   Psychosocial Evaluation and Intervention:     Psychosocial Evaluation - 12/09/16 1701      Psychosocial Evaluation & Interventions   Interventions Encouraged to exercise with the program and follow exercise prescription;Stress management education   Comments 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  He is an 83 year old who experienced a severe allergic reaction to a [place] Tick bite on 7/4 and had a heart attack and stent inserted on 7/8.  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  He has multiple health issues with neuropathy; restless leg syndrome; A-Fib; Glaucoma; GERD and Diverticulosis.  He reports sleeping well and his appetite is "so so" now that he cannot eat red meat and his options are more limited.  He reports a history of depression approximately 40 years ago and he "ran it off" by a steady regimen of jogging.  He states following the allergic reaction on 7/4 he has had some PTSD type symptoms but they have improved with time.  He also states there are some depressive symptoms currently since he cannot do things he loved doing any longer - like playing golf - which he did about 3x/week until recently.  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  His goals for this program are to increase his stability in walking and his stamina overall.  He and his spouse are members at a local gym and will continue working out there regularly upon completion of this program.     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  This should also help his current depressive symptoms reported as well.  The educational and psychoeducational components of this program will be beneficial is understanding and managing his illness more positively.  Staff will follow with him throughout the course of this program.    Continue Psychosocial Services  Follow up required by staff      Psychosocial Re-Evaluation:     Psychosocial Re-Evaluation    [place] Name 12/31/16 1658             Psychosocial Re-Evaluation   Comments Sam reports stress from the tick bite incident.  He is exercising regularly.       Expected Outcomes Short - Sam will  continue in HT with exercise and education and meet with RD for dietary concerns. Long - Sam will implement all strategies into lifestyle          Psychosocial Discharge (Final Psychosocial Re-Evaluation):     Psychosocial Re-Evaluation - 12/31/16 1658      Psychosocial Re-Evaluation   Comments Sam reports stress from the tick bite incident.  He is exercising regularly.   Expected Outcomes Short - Sam will continue in HT with exercise and education and meet with RD for dietary concerns. Long - Sam will implement all strategies into lifestyle      Vocational Rehabilitation: Provide vocational rehab assistance to qualifying candidates.   Vocational Rehab Evaluation & Intervention:     Vocational Rehab - 12/07/16 1215      Initial Vocational Rehab Evaluation & Intervention   Assessment shows need for Vocational Rehabilitation No      Education: Education Goals: Education classes will be provided on a variety of topics geared toward better understanding of heart health and risk factor modification. Participant will state understanding/return demonstration of topics presented as noted by education test scores.  Learning Barriers/Preferences:     Learning Barriers/Preferences - 12/07/16 1215      Learning Barriers/Preferences   Learning Barriers Hearing   Learning Preferences Video;Written Material      Education Topics: General Nutrition Guidelines/Fats and Fiber: -Group instruction provided by verbal, written material, models and posters to present the general guidelines for heart healthy nutrition. Gives an explanation and review of dietary fats and fiber.   Controlling Sodium/Reading Food Labels: -Group verbal and written material supporting the discussion of sodium use in heart healthy nutrition. Review and explanation with models, verbal and written materials for utilization of the food label.   Exercise Physiology & Risk Factors: - Group verbal and written  instruction with models to review the exercise physiology of the cardiovascular system and associated critical values. Details cardiovascular disease risk factors and the goals associated with each risk factor.   Aerobic Exercise & Resistance Training: - Gives group verbal and written discussion on the health impact of inactivity. On the components of aerobic and resistive training programs and the benefits of this training and how to safely progress through these programs.   Flexibility, Balance, General Exercise Guidelines: - Provides group verbal and written  instruction on the benefits of flexibility and balance training programs. Provides general exercise guidelines with specific guidelines to those with heart or lung disease. Demonstration and skill practice provided.   Cardiac Rehab from 12/28/2016 in [facility] Cardiac and Pulmonary Rehab  Date  12/09/16  Educator  AS  Instruction Review Code  1- Verbalizes Understanding      Stress Management: - Provides group verbal and written instruction about the health risks of elevated stress, cause of high stress, and healthy ways to reduce stress.   Depression: - Provides group verbal and written instruction on the correlation between heart/lung disease and depressed mood, treatment options, and the stigmas associated with seeking treatment.   Anatomy & Physiology of the Heart: - Group verbal and written instruction and models provide basic cardiac anatomy and physiology, with the coronary electrical and arterial systems. Review of: AMI, Angina, Valve disease, Heart Failure, Cardiac Arrhythmia, Pacemakers, and the ICD.   Cardiac Rehab from 12/28/2016 in [facility] Cardiac and Pulmonary Rehab  Date  12/14/16  Educator  [facility]  Instruction Review Code  1- Verbalizes Understanding      Cardiac Procedures: - Group verbal and written instruction to review commonly prescribed medications for heart disease. Reviews the medication, class of the drug, and side  effects. Includes the steps to properly store meds and maintain the prescription regimen. (beta blockers and nitrates)   Cardiac Rehab from 12/28/2016 in [facility] Cardiac and Pulmonary Rehab  Date  12/21/16  Educator  [facility]  Instruction Review Code  1- Verbalizes Understanding      Cardiac Medications I: - Group verbal and written instruction to review commonly prescribed medications for heart disease. Reviews the medication, class of the drug, and side effects. Includes the steps to properly store meds and maintain the prescription regimen.   Cardiac Rehab from 12/28/2016 in [facility] Cardiac and Pulmonary Rehab  Date  12/28/16  Educator  MA  Instruction Review Code  1- Verbalizes Understanding      Cardiac Medications II: -Group verbal and written instruction to review commonly prescribed medications for heart disease. Reviews the medication, class of the drug, and side effects. (all other drug classes)    Go Sex-Intimacy & Heart Disease, Get SMART - Goal Setting: - Group verbal and written instruction through game format to discuss heart disease and the return to sexual intimacy. Provides group verbal and written material to discuss and apply goal setting through the application of the S.M.A.R.T. Method.   Cardiac Rehab from 12/28/2016 in [facility] Cardiac and Pulmonary Rehab  Date  12/21/16  Educator  [facility]  Instruction Review Code  1- Verbalizes Understanding      Other Matters of the Heart: - Provides group verbal, written materials and models to describe Heart Failure, Angina, Valve Disease, Peripheral Artery Disease, and Diabetes in the realm of heart disease. Includes description of the disease process and treatment options available to the cardiac patient.   Cardiac Rehab from 12/28/2016 in [facility] Cardiac and Pulmonary Rehab  Date  12/14/16  Educator  [facility]  Instruction Review Code  1- Verbalizes Understanding      Exercise & Equipment Safety: - Individual verbal instruction and demonstration  of equipment use and safety with use of the equipment.   Cardiac Rehab from 12/28/2016 in [facility] Cardiac and Pulmonary Rehab  Date  12/07/16  Educator  [facility]. [place]  Instruction Review Code  3- Needs Reinforcement      Infection Prevention: - Provides verbal and written material to individual with discussion of  infection control including proper hand washing and proper equipment cleaning during exercise session.   Cardiac Rehab from 12/28/2016 in [facility] Cardiac and Pulmonary Rehab  Date  12/07/16  Educator  C. EnterkinRN  Instruction Review Code  1- Verbalizes Understanding      Falls Prevention: - Provides verbal and written material to individual with discussion of falls prevention and safety.   Cardiac Rehab from 12/28/2016 in [facility] Cardiac and Pulmonary Rehab  Date  12/07/16  Educator  C. ENterkinRN  Instruction Review Code  1- Verbalizes Understanding      Diabetes: - Individual verbal and written instruction to review signs/symptoms of diabetes, desired ranges of glucose level fasting, after meals and with exercise. Acknowledge that pre and post exercise glucose checks will be done for 3 sessions at entry of program.   Other: -Provides group and verbal instruction on various topics (see comments)    Knowledge Questionnaire Score:     Knowledge Questionnaire Score - 12/07/16 1215      Knowledge Questionnaire Score   Pre Score 28/28      Core Components/Risk Factors/Patient Goals at Admission:     Personal Goals and Risk Factors at Admission - 12/07/16 1216      Core Components/Risk Factors/Patient Goals on Admission    Weight Management Yes;Weight Loss   Intervention Weight Management: Develop a combined nutrition and exercise program designed to reach desired caloric intake, while maintaining appropriate intake of nutrient and fiber, sodium and fats, and appropriate energy expenditure required for the weight goal.;Weight Management: Provide education and appropriate  resources to help participant work on and attain dietary goals.   Admit Weight 182 lb 1.6 oz (82.6 kg)   Goal Weight: Short Term 180 lb (81.6 kg)   Goal Weight: Long Term 172 lb (78 kg)   Expected Outcomes Short Term: Continue to assess and modify interventions until short term weight is achieved;Long Term: Adherence to nutrition and physical activity/exercise program aimed toward attainment of established weight goal;Weight Loss: Understanding of general recommendations for a balanced deficit meal plan, which promotes 1-2 lb weight loss per week and includes a negative energy balance of [id number] kcal/d;Understanding recommendations for meals to include 15-35% energy as protein, 25-35% energy from fat, 35-60% energy from carbohydrates, less than 253m of dietary cholesterol, 20-35 gm of total fiber daily;Understanding of distribution of calorie intake throughout the day with the consumption of 4-5 meals/snacks   Hypertension Yes   Intervention Provide education on lifestyle modifcations including regular physical activity/exercise, weight management, moderate sodium restriction and increased consumption of fresh fruit, vegetables, and low fat dairy, alcohol moderation, and smoking cessation.;Monitor prescription use compliance.   Expected Outcomes Short Term: Continued assessment and intervention until BP is < 140/955mHG in hypertensive participants. < 130/8061mG in hypertensive participants with diabetes, heart failure or chronic kidney disease.;Long Term: Maintenance of blood pressure at goal levels.   Lipids Yes   Intervention Provide education and support for participant on nutrition & aerobic/resistive exercise along with prescribed medications to achieve LDL <55m39mDL >40mg46mExpected Outcomes Short Term: Participant states understanding of desired cholesterol values and is compliant with medications prescribed. Participant is following exercise prescription and nutrition guidelines.;Long Term:  Cholesterol controlled with medications as prescribed, with individualized exercise RX and with personalized nutrition plan. Value goals: LDL < 55mg,13m > 40 mg.      Core Components/Risk Factors/Patient Goals Review:      Goals and Risk Factor Review    Row Name 12/31/16  1656             Core Components/Risk Factors/Patient Goals Review   Personal Goals Review Weight Management/Obesity;Hypertension       Review Sam reports some stress due to the anaphylactic reaction to a tick bite.  He is scheduling to meet with the RD.         Expected Outcomes Short - Sam will meet with RD and get some healthy choices that address all health needs.  Long - Sam will implement suggestions from RD          Core Components/Risk Factors/Patient Goals at Discharge (Final Review):      Goals and Risk Factor Review - 12/31/16 1656      Core Components/Risk Factors/Patient Goals Review   Personal Goals Review Weight Management/Obesity;Hypertension   Review Sam reports some stress due to the anaphylactic reaction to a tick bite.  He is scheduling to meet with the RD.     Expected Outcomes Short - Sam will meet with RD and get some healthy choices that address all health needs.  Long - Sam will implement suggestions from RD      ITP Comments:     ITP Comments    Row Name 12/07/16 1223 12/07/16 1227 12/09/16 1144 01/06/17 0827     ITP Comments ITP created during Medical Review after Cardiac Rehab informed consent was signed.  Smitty Knudsen, M.D. 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  30 day review. Continue with ITP unless directed changes per Medical Director review.   30 day review. Continue with ITP unless directed changes per Medical Director Review.        Comments:

## 2017-01-07 ENCOUNTER — Encounter: Payer: Medicare Other | Admitting: *Deleted

## 2017-01-07 DIAGNOSIS — I213 ST elevation (STEMI) myocardial infarction of unspecified site: Secondary | ICD-10-CM | POA: Diagnosis not present

## 2017-01-07 NOTE — Progress Notes (Signed)
Daily Session Note  Patient Details  Name: DUC CROCKET MRN: [id number] Date of Birth: [birth year] Referring Provider:     Cardiac Rehab from 12/07/2016 in [facility] Cardiac and Pulmonary Rehab  Referring Provider  Bartholome Bill MD      Encounter Date: 01/07/2017  Check In:     Session Check In - 01/07/17 1626      Check-In   Location [facility]-Cardiac & Pulmonary Rehab   Staff Present Gerlene Burdock, RN, Vickki Hearing, BA, ACSM CEP, Exercise Physiologist;Zaylon Bossier Flavia Shipper   Supervising physician immediately available to respond to emergencies See telemetry face sheet for immediately available ER MD   Medication changes reported     No   Fall or balance concerns reported    No   Warm-up and Cool-down Performed on first and last piece of equipment   Resistance Training Performed Yes   VAD Patient? No     Pain Assessment   Currently in Pain? No/denies   Multiple Pain Sites No         History  Smoking Status  . Never Smoker  Smokeless Tobacco  . Never Used    Goals Met:  Independence with exercise equipment Exercise tolerated well No report of cardiac concerns or symptoms Strength training completed today  Goals Unmet:  Not Applicable  Comments: Pt able to follow exercise prescription today without complaint.  Will continue to monitor for progression.   Dr. Emily Filbert is Medical Director for [place] and [facility].

## 2017-01-08 ENCOUNTER — Encounter: Payer: Self-pay | Admitting: *Deleted

## 2017-01-11 ENCOUNTER — Encounter: Payer: Medicare Other | Admitting: *Deleted

## 2017-01-11 DIAGNOSIS — I214 Non-ST elevation (NSTEMI) myocardial infarction: Secondary | ICD-10-CM

## 2017-01-11 DIAGNOSIS — Z955 Presence of coronary angioplasty implant and graft: Secondary | ICD-10-CM

## 2017-01-11 DIAGNOSIS — I213 ST elevation (STEMI) myocardial infarction of unspecified site: Secondary | ICD-10-CM

## 2017-01-11 NOTE — Progress Notes (Signed)
Daily Session Note  Patient Details  Name: SMILEY BIRR MRN: [id number] Date of Birth: [birth year] Referring Provider:     Cardiac Rehab from 12/07/2016 in [facility] Cardiac and Pulmonary Rehab  Referring Provider  Bartholome Bill MD      Encounter Date: 01/11/2017  Check In:     Session Check In - 01/11/17 1639      Check-In   Location [facility]-Cardiac & Pulmonary Rehab   Staff Present Nada Maclachlan, BA, ACSM CEP, Exercise Physiologist;Gagan Dillion Amedeo Plenty, BS, ACSM CEP, Exercise Physiologist;Meredith Sherryll Burger, RN BSN   Supervising physician immediately available to respond to emergencies See telemetry face sheet for immediately available ER MD   Medication changes reported     No   Fall or balance concerns reported    No   Warm-up and Cool-down Performed on first and last piece of equipment   Resistance Training Performed Yes   VAD Patient? No     Pain Assessment   Currently in Pain? No/denies   Multiple Pain Sites No         History  Smoking Status  . Never Smoker  Smokeless Tobacco  . Never Used    Goals Met:  Independence with exercise equipment Exercise tolerated well No report of cardiac concerns or symptoms Strength training completed today  Goals Unmet:  Not Applicable  Comments: Pt able to follow exercise prescription today without complaint.  Will continue to monitor for progression.    Dr. Emily Filbert is Medical Director for [place] [facility] [place] and [facility].

## 2017-01-13 DIAGNOSIS — I213 ST elevation (STEMI) myocardial infarction of unspecified site: Secondary | ICD-10-CM | POA: Diagnosis not present

## 2017-01-13 DIAGNOSIS — Z955 Presence of coronary angioplasty implant and graft: Secondary | ICD-10-CM

## 2017-01-13 DIAGNOSIS — I214 Non-ST elevation (NSTEMI) myocardial infarction: Secondary | ICD-10-CM

## 2017-01-13 NOTE — Progress Notes (Signed)
Daily Session Note  Patient Details  Name: Donald Riley MRN: [id number] Date of Birth: 1933-09-26 Referring Provider:     Cardiac Rehab from 12/07/2016 in [facility] Cardiac and Pulmonary Rehab  Referring Provider  Bartholome Bill MD      Encounter Date: 01/13/2017  Check In:     Session Check In - 01/13/17 1636      Check-In   Location [facility]-Cardiac & Pulmonary Rehab   Staff Present Gerlene Burdock, RN, Vickki Hearing, BA, ACSM CEP, Exercise Physiologist;Other  [place] physician immediately available to respond to emergencies See telemetry face sheet for immediately available ER MD   Medication changes reported     Yes   Fall or balance concerns reported    No   Warm-up and Cool-down Performed on first and last piece of equipment   Resistance Training Performed Yes   VAD Patient? No     Pain Assessment   Currently in Pain? No/denies         History  Smoking Status  . Never Smoker  Smokeless Tobacco  . Never Used    Goals Met:  Independence with exercise equipment Exercise tolerated well No report of cardiac concerns or symptoms Strength training completed today  Goals Unmet:  Not Applicable  Comments: Pt able to follow exercise prescription today without complaint.  Will continue to monitor for progression.    Dr. Emily Filbert is Medical Director for [place] and [facility].

## 2017-01-14 DIAGNOSIS — I213 ST elevation (STEMI) myocardial infarction of unspecified site: Secondary | ICD-10-CM | POA: Diagnosis not present

## 2017-01-14 NOTE — Progress Notes (Signed)
Daily Session Note  Patient Details  Name: VERNER MCCRONE MRN: [id number] Date of Birth: 08-28-1933 Referring Provider:     Cardiac Rehab from 12/07/2016 in [facility] Cardiac and Pulmonary Rehab  Referring Provider  Bartholome Bill MD      Encounter Date: 01/14/2017  Check In:     Session Check In - 01/14/17 1632      Check-In   Location [facility]-Cardiac & Pulmonary Rehab   Staff Present Gerlene Burdock, RN, Moises Blood, BS, ACSM CEP, Exercise Physiologist;Joseph Flavia Shipper   Supervising physician immediately available to respond to emergencies See telemetry face sheet for immediately available ER MD   Medication changes reported     No   Fall or balance concerns reported    No   Warm-up and Cool-down Performed on first and last piece of equipment   Resistance Training Performed Yes   VAD Patient? No     Pain Assessment   Currently in Pain? No/denies   Multiple Pain Sites No         History  Smoking Status  . Never Smoker  Smokeless Tobacco  . Never Used    Goals Met:  Independence with exercise equipment Exercise tolerated well No report of cardiac concerns or symptoms Strength training completed today  Goals Unmet:  Not Applicable  Comments: Pt able to follow exercise prescription today without complaint.  Will continue to monitor for progression.   Dr. Emily Filbert is Medical Director for [place] and [facility].

## 2017-01-18 DIAGNOSIS — Z955 Presence of coronary angioplasty implant and graft: Secondary | ICD-10-CM

## 2017-01-18 DIAGNOSIS — I213 ST elevation (STEMI) myocardial infarction of unspecified site: Secondary | ICD-10-CM

## 2017-01-18 DIAGNOSIS — I214 Non-ST elevation (NSTEMI) myocardial infarction: Secondary | ICD-10-CM

## 2017-01-18 NOTE — Progress Notes (Signed)
Daily Session Note  Patient Details  Name: HYLAND MOLLENKOPF MRN: [id number] Date of Birth: 1934/01/15 Referring Provider:     Cardiac Rehab from 12/07/2016 in [facility] Cardiac and Pulmonary Rehab  Referring Provider  Bartholome Bill MD      Encounter Date: 01/18/2017  Check In:     Session Check In - 01/18/17 1642      Check-In   Location [facility]-Cardiac & Pulmonary Rehab   Staff Present Nada Maclachlan, BA, ACSM CEP, Exercise Physiologist;Kelly Amedeo Plenty, BS, ACSM CEP, Exercise Physiologist;Meredith Sherryll Burger, RN BSN   Supervising physician immediately available to respond to emergencies See telemetry face sheet for immediately available ER MD   Medication changes reported     No   Fall or balance concerns reported    No   Warm-up and Cool-down Performed on first and last piece of equipment   Resistance Training Performed Yes   VAD Patient? No     Pain Assessment   Currently in Pain? No/denies         History  Smoking Status  . Never Smoker  Smokeless Tobacco  . Never Used    Goals Met:  Independence with exercise equipment Exercise tolerated well No report of cardiac concerns or symptoms Strength training completed today  Goals Unmet:  Not Applicable  Comments: Pt able to follow exercise prescription today without complaint.  Will continue to monitor for progression.    Dr. Emily Filbert is Medical Director for [place] and [facility].

## 2017-01-20 ENCOUNTER — Telehealth: Payer: Self-pay | Admitting: *Deleted

## 2017-01-20 ENCOUNTER — Encounter: Payer: Medicare Other | Admitting: *Deleted

## 2017-01-20 ENCOUNTER — Encounter: Payer: Self-pay | Admitting: *Deleted

## 2017-01-20 DIAGNOSIS — Z955 Presence of coronary angioplasty implant and graft: Secondary | ICD-10-CM

## 2017-01-20 DIAGNOSIS — I213 ST elevation (STEMI) myocardial infarction of unspecified site: Secondary | ICD-10-CM

## 2017-01-20 NOTE — Progress Notes (Signed)
Daily Session Note  Patient Details  Name: Donald Riley MRN: [id number] Date of Birth: [birth year] Referring Provider:     Cardiac Rehab from 12/07/2016 in [facility] Cardiac and Pulmonary Rehab  Referring Provider  Bartholome Bill MD      Encounter Date: 01/20/2017  Check In:     Session Check In - 01/20/17 1632      Check-In   Location [facility]-Cardiac & Pulmonary Rehab   Staff Present Renita Papa, RN Vickki Hearing, BA, ACSM CEP, Exercise Physiologist;Carroll Enterkin, RN, BSN   Supervising physician immediately available to respond to emergencies See telemetry face sheet for immediately available ER MD   Medication changes reported     No   Fall or balance concerns reported    No   Warm-up and Cool-down Performed on first and last piece of equipment   Resistance Training Performed Yes   VAD Patient? No     Pain Assessment   Currently in Pain? No/denies         History  Smoking Status  . Never Smoker  Smokeless Tobacco  . Never Used    Goals Met:  Independence with exercise equipment Exercise tolerated well No report of cardiac concerns or symptoms Strength training completed today  Goals Unmet:  Not Applicable  Comments: Pt able to follow exercise prescription today without complaint.  Will continue to monitor for progression.    Dr. Emily Filbert is Medical Director for [place] and [facility].

## 2017-01-20 NOTE — Telephone Encounter (Signed)
I left a vm to touch base with Donald Riley to discuss how his CPAP is doing and how Cardiac Rehab is going for him.

## 2017-01-21 ENCOUNTER — Telehealth: Payer: Self-pay | Admitting: *Deleted

## 2017-01-21 ENCOUNTER — Encounter: Payer: Medicare Other | Admitting: *Deleted

## 2017-01-21 ENCOUNTER — Encounter: Payer: Self-pay | Admitting: *Deleted

## 2017-01-21 DIAGNOSIS — I213 ST elevation (STEMI) myocardial infarction of unspecified site: Secondary | ICD-10-CM

## 2017-01-21 DIAGNOSIS — I214 Non-ST elevation (NSTEMI) myocardial infarction: Secondary | ICD-10-CM

## 2017-01-21 DIAGNOSIS — Z955 Presence of coronary angioplasty implant and graft: Secondary | ICD-10-CM

## 2017-01-21 NOTE — Telephone Encounter (Signed)
I called and left a vm to let Dr. 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

## 2017-01-21 NOTE — Progress Notes (Signed)
Daily Session Note  Patient Details  Name: Donald Riley MRN: [id number] Date of Birth: [birth year] Referring Provider:     Cardiac Rehab from 12/07/2016 in [facility] Cardiac and Pulmonary Rehab  Referring Provider  Bartholome Bill MD      Encounter Date: 01/21/2017  Check In:     Session Check In - 01/21/17 1656      Check-In   Location [facility]-Cardiac & Pulmonary Rehab   Staff Present Earlean Shawl, BS, ACSM CEP, Exercise Physiologist;Joseph Tessie Fass RCP,RRT,BSRT;Carroll Enterkin, RN, BSN   Supervising physician immediately available to respond to emergencies See telemetry face sheet for immediately available ER MD   Medication changes reported     No   Fall or balance concerns reported    No   Warm-up and Cool-down Performed on first and last piece of equipment   Resistance Training Performed Yes   VAD Patient? No     Pain Assessment   Currently in Pain? No/denies   Multiple Pain Sites No         History  Smoking Status  . Never Smoker  Smokeless Tobacco  . Never Used    Goals Met:  Independence with exercise equipment Exercise tolerated well No report of cardiac concerns or symptoms Strength training completed today  Goals Unmet:  Not Applicable  Comments: Pt able to follow exercise prescription today without complaint.  Will continue to monitor for progression.    Dr. Emily Filbert is Medical Director for [place] and [facility].

## 2017-01-25 DIAGNOSIS — I214 Non-ST elevation (NSTEMI) myocardial infarction: Secondary | ICD-10-CM

## 2017-01-25 DIAGNOSIS — I213 ST elevation (STEMI) myocardial infarction of unspecified site: Secondary | ICD-10-CM

## 2017-01-25 DIAGNOSIS — Z955 Presence of coronary angioplasty implant and graft: Secondary | ICD-10-CM

## 2017-01-25 NOTE — Progress Notes (Signed)
Daily Session Note  Patient Details  Name: Donald Riley MRN: [id number] Date of Birth: [birth year] Referring Provider:     Cardiac Rehab from 12/07/2016 in [facility] Cardiac and Pulmonary Rehab  Referring Provider  Bartholome Bill MD      Encounter Date: 01/25/2017  Check In:     Session Check In - 01/25/17 1726      Check-In   Location [facility]-Cardiac & Pulmonary Rehab   Staff Present Nada Maclachlan, BA, ACSM CEP, Exercise Physiologist;Kelly Amedeo Plenty, BS, ACSM CEP, Exercise Physiologist;Meredith Sherryll Burger, RN BSN   Supervising physician immediately available to respond to emergencies See telemetry face sheet for immediately available ER MD   Medication changes reported     No   Fall or balance concerns reported    No   Warm-up and Cool-down Performed on first and last piece of equipment   Resistance Training Performed Yes   VAD Patient? No     Pain Assessment   Currently in Pain? No/denies   Multiple Pain Sites No         History  Smoking Status  . Never Smoker  Smokeless Tobacco  . Never Used    Goals Met:  Independence with exercise equipment Exercise tolerated well No report of cardiac concerns or symptoms Strength training completed today  Goals Unmet:  Not Applicable  Comments: Pt able to follow exercise prescription today without complaint.  Will continue to monitor for progression.    Dr. Emily Filbert is Medical Director for [place] and [facility].

## 2017-01-27 ENCOUNTER — Encounter: Payer: Medicare Other | Admitting: *Deleted

## 2017-01-27 ENCOUNTER — Encounter: Payer: Self-pay | Admitting: Dietician

## 2017-01-27 DIAGNOSIS — I213 ST elevation (STEMI) myocardial infarction of unspecified site: Secondary | ICD-10-CM | POA: Diagnosis not present

## 2017-01-27 DIAGNOSIS — Z955 Presence of coronary angioplasty implant and graft: Secondary | ICD-10-CM

## 2017-01-27 NOTE — Progress Notes (Signed)
Daily Session Note  Patient Details  Name: Donald Riley MRN: [id number] Date of Birth: [birth year] Referring Provider:     Cardiac Rehab from 12/07/2016 in [facility] Cardiac and Pulmonary Rehab  Referring Provider  Bartholome Bill MD      Encounter Date: 01/27/2017  Check In:      History  Smoking Status  . Never Smoker  Smokeless Tobacco  . Never Used    Goals Met:  Exercise tolerated well No report of cardiac concerns or symptoms Strength training completed today  Goals Unmet:  Not Applicable  Comments: Doing well with exercise prescription progression.    Dr. Emily Filbert is Medical Director for [place] and [facility].

## 2017-01-28 ENCOUNTER — Encounter: Payer: Medicare Other | Attending: Cardiovascular Disease

## 2017-01-28 VITALS — Ht 68.9 in | Wt 184.7 lb

## 2017-01-28 DIAGNOSIS — I213 ST elevation (STEMI) myocardial infarction of unspecified site: Secondary | ICD-10-CM | POA: Diagnosis present

## 2017-01-28 DIAGNOSIS — Z955 Presence of coronary angioplasty implant and graft: Secondary | ICD-10-CM | POA: Insufficient documentation

## 2017-01-28 DIAGNOSIS — Z48812 Encounter for surgical aftercare following surgery on the circulatory system: Secondary | ICD-10-CM | POA: Diagnosis not present

## 2017-01-28 NOTE — Progress Notes (Signed)
Daily Session Note  Patient Details  Name: Donald Riley MRN: [id number] Date of Birth: 29-Apr-1933 Referring Provider:     Cardiac Rehab from 12/07/2016 in [facility] Cardiac and Pulmonary Rehab  Referring Provider  Bartholome Bill MD      Encounter Date: 01/28/2017  Check In:     Session Check In - 01/28/17 1611      Check-In   Location [facility]-Cardiac & Pulmonary Rehab   Staff Present Gerlene Burdock, RN, BSN;Surya Schroeter RCP,RRT,BSRT;Kelly [place], [place], ACSM CEP, Exercise Physiologist   Supervising physician immediately available to respond to emergencies See telemetry face sheet for immediately available ER MD   Medication changes reported     No   Fall or balance concerns reported    No   Warm-up and Cool-down Performed on first and last piece of equipment   Resistance Training Performed Yes   VAD Patient? No     Pain Assessment   Currently in Pain? No/denies         History  Smoking Status  . Never Smoker  Smokeless Tobacco  . Never Used    Goals Met:  Independence with exercise equipment Exercise tolerated well No report of cardiac concerns or symptoms Strength training completed today  Goals Unmet:  Not Applicable  Comments:      [place] Name 12/07/16 1506 01/28/17 1629       6 Minute Walk   Phase Initial Discharge    Distance 560 feet 765 feet    Distance % Change  - 36.6 %    Distance Feet Change  - 205 ft    Walk Time 6 minutes 6 minutes    # of Rest Breaks 0 0    MPH  - 1.45    METS 1.12 1.11    RPE 11 10    VO2 Peak 3.94 3.86    Symptoms No No    Resting HR 64 bpm 73 bpm    Resting BP 136/72 124/64    Resting Oxygen Saturation  96 %  -    Exercise Oxygen Saturation  during 6 min walk 95 % 95 %    Max Ex. HR 100 bpm 75 bpm    Max Ex. BP 138/74 120/70    2 Minute Post BP 124/70  -      Pt able to follow exercise prescription today without complaint.  Will continue to monitor for progression.   Dr. Emily Filbert is Medical Director  for [place] and [facility].

## 2017-01-28 NOTE — Progress Notes (Deleted)
Daily Session Note  Patient Details  Name: Donald Riley MRN: [id number] Date of Birth: May 10, 1933 Referring Provider:     Cardiac Rehab from 12/07/2016 in [facility] Cardiac and Pulmonary Rehab  Referring Provider  Bartholome Bill MD      Encounter Date: 01/28/2017  Check In:     Session Check In - 01/28/17 1611      Check-In   Location [facility]-Cardiac & Pulmonary Rehab   Staff Present Gerlene Burdock, RN, BSN;Joseph Hood RCP,RRT,BSRT;Earlene Bjelland [place], [place], ACSM CEP, Exercise Physiologist   Supervising physician immediately available to respond to emergencies See telemetry face sheet for immediately available ER MD   Medication changes reported     No   Fall or balance concerns reported    No   Warm-up and Cool-down Performed on first and last piece of equipment   Resistance Training Performed Yes   VAD Patient? No     Pain Assessment   Currently in Pain? No/denies         History  Smoking Status  . Never Smoker  Smokeless Tobacco  . Never Used    Goals Met:  Independence with exercise equipment Exercise tolerated well Personal goals reviewed No report of cardiac concerns or symptoms Strength training completed today  Goals Unmet:  Not Applicable  Comments: Pt able to follow exercise prescription today without complaint.  Will continue to monitor for progression.      [place] Name 12/07/16 1506 01/28/17 1629       6 Minute Walk   Phase Initial Discharge    Distance 560 feet 765 feet    Distance % Change  - 36.6 %    Distance Feet Change  - 205 ft    Walk Time 6 minutes 6 minutes    # of Rest Breaks 0 0    MPH  - 1.45    METS 1.12 1.11    RPE 11 10    VO2 Peak 3.94 3.86    Symptoms No No    Resting HR 64 bpm 73 bpm    Resting BP 136/72 124/64    Resting Oxygen Saturation  96 %  -    Exercise Oxygen Saturation  during 6 min walk 95 % 95 %    Max Ex. HR 100 bpm 75 bpm    Max Ex. BP 138/74 120/70    2 Minute Post BP 124/70  -         Dr.  Emily Filbert is Medical Director for [place] and [facility].

## 2017-02-01 ENCOUNTER — Encounter: Payer: Medicare Other | Admitting: *Deleted

## 2017-02-01 DIAGNOSIS — I213 ST elevation (STEMI) myocardial infarction of unspecified site: Secondary | ICD-10-CM

## 2017-02-01 DIAGNOSIS — Z955 Presence of coronary angioplasty implant and graft: Secondary | ICD-10-CM

## 2017-02-01 DIAGNOSIS — I214 Non-ST elevation (NSTEMI) myocardial infarction: Secondary | ICD-10-CM

## 2017-02-01 NOTE — Progress Notes (Signed)
Daily Session Note  Patient Details  Name: Donald Riley MRN: [id number] Date of Birth: 06-20-1933 Referring Provider:     Cardiac Rehab from 12/07/2016 in [facility] Cardiac and Pulmonary Rehab  Referring Provider  Bartholome Bill MD      Encounter Date: 02/01/2017  Check In: Session Check In - 02/01/17 1714      Check-In   Location  [facility]-Cardiac & Pulmonary Rehab    Staff Present  Earlean Shawl, BS, ACSM CEP, Exercise Physiologist;Amanda Oletta Darter, BA, ACSM CEP, Exercise Physiologist;Meredith Sherryll Burger, RN BSN    Supervising physician immediately available to respond to emergencies  See telemetry face sheet for immediately available ER MD    Medication changes reported      No    Fall or balance concerns reported     No    Warm-up and Cool-down  Performed on first and last piece of equipment    Resistance Training Performed  Yes    VAD Patient?  No      Pain Assessment   Currently in Pain?  No/denies    Multiple Pain Sites  No          Social History   Tobacco Use  Smoking Status Never Smoker  Smokeless Tobacco Never Used    Goals Met:  Independence with exercise equipment Exercise tolerated well No report of cardiac concerns or symptoms Strength training completed today  Goals Unmet:  Not Applicable  Comments: Pt able to follow exercise prescription today without complaint.  Will continue to monitor for progression.    Dr. Emily Filbert is Medical Director for [facility].

## 2017-02-02 NOTE — Patient Instructions (Signed)
Discharge Instructions  Patient Details  Name: Donald Riley MRN: [id number] Date of Birth: Feb 25, 1934 Referring Provider:  Wellington Hampshire, MD   Number of Visits: 90  Reason for Discharge:  Patient reached a stable level of exercise. Patient independent in their exercise. Patient has met program and personal goals.  Smoking History:  Social History   Tobacco Use  Smoking Status Never Smoker  Smokeless Tobacco Never Used    Diagnosis:  ST elevation myocardial infarction (STEMI), unspecified artery (HCC)  Status post coronary artery stent placement  NSTEMI (non-ST elevated myocardial infarction) [facility])  Initial Exercise Prescription: Initial Exercise Prescription - 12/07/16 1500      Date of Initial Exercise RX and Referring Provider   Date  12/07/16    Referring Provider  Bartholome Bill MD      Treadmill   MPH  1    Grade  0    Minutes  15    METs  1.77      NuStep   Level  1    SPM  80    Minutes  15    METs  1.5      Biostep-RELP   Level  1    SPM  50    Minutes  15    METs  2      Prescription Details   Frequency (times per week)  3    Duration  Progress to 45 minutes of aerobic exercise without signs/symptoms of physical distress      Intensity   THRR 40-80% of Max Heartrate  94-124    Ratings of Perceived Exertion  11-13    Perceived Dyspnea  0-4      Progression   Progression  Continue to progress workloads to maintain intensity without signs/symptoms of physical distress.      Resistance Training   Training Prescription  Yes    Weight  3 lbs    Reps  10-15       Discharge Exercise Prescription (Final Exercise Prescription Changes): Exercise Prescription Changes - 01/29/17 0900      Response to Exercise   Blood Pressure (Admit)  124/64    Blood Pressure (Exercise)  120/70    Blood Pressure (Exit)  120/70    Heart Rate (Admit)  80 bpm    Heart Rate (Exercise)  102 bpm    Heart Rate (Exit)  76 bpm    Rating of Perceived Exertion  (Exercise)  12    Symptoms  none    Duration  Continue with 45 min of aerobic exercise without signs/symptoms of physical distress.    Intensity  THRR unchanged      Progression   Progression  Continue to progress workloads to maintain intensity without signs/symptoms of physical distress.    Average METs  2.4      Resistance Training   Training Prescription  Yes    Weight  3 lb    Reps  10-15      NuStep   Level  4    SPM  80    Minutes  15    METs  2.8      Biostep-RELP   Level  1    SPM  50    Minutes  15    METs  2      Home Exercise Plan   Plans to continue exercise at  [facility] (comment)    Frequency  Add 2 additional days to program exercise sessions.  Initial Home Exercises Provided  12/31/16       Functional Capacity: 6 Minute Walk    Row Name 12/07/16 1506 01/28/17 1629       6 Minute Walk   Phase  Initial  Discharge    Distance  560 feet  765 feet    Distance % Change  -  36.6 %    Distance Feet Change  -  205 ft    Walk Time  6 minutes  6 minutes    # of Rest Breaks  0  0    MPH  -  1.45    METS  1.12  1.11    RPE  11  10    VO2 Peak  3.94  3.86    Symptoms  No  No    Resting HR  64 bpm  73 bpm    Resting BP  136/72  124/64    Resting Oxygen Saturation   96 %  -    Exercise Oxygen Saturation  during 6 min walk  95 %  95 %    Max Ex. HR  100 bpm  75 bpm    Max Ex. BP  138/74  120/70    2 Minute Post BP  124/70  -       Quality of Life: Quality of Life - 02/02/17 1512      Quality of Life Scores   Health/Function Post  14.43 %    Socioeconomic Post  27.92 %    Psych/Spiritual Post  18.43 %    Family Post  27.6 %    GLOBAL Post  19.73 %       Personal Goals: Goals established at orientation with interventions provided to work toward goal. Personal Goals and Risk Factors at Admission - 12/07/16 1216      Core Components/Risk Factors/Patient Goals on Admission    Weight Management  Yes;Weight Loss    Intervention  Weight  Management: Develop a combined nutrition and exercise program designed to reach desired caloric intake, while maintaining appropriate intake of nutrient and fiber, sodium and fats, and appropriate energy expenditure required for the weight goal.;Weight Management: Provide education and appropriate resources to help participant work on and attain dietary goals.    Admit Weight  182 lb 1.6 oz (82.6 kg)    Goal Weight: Short Term  180 lb (81.6 kg)    Goal Weight: Long Term  172 lb (78 kg)    Expected Outcomes  Short Term: Continue to assess and modify interventions until short term weight is achieved;Long Term: Adherence to nutrition and physical activity/exercise program aimed toward attainment of established weight goal;Weight Loss: Understanding of general recommendations for a balanced deficit meal plan, which promotes 1-2 lb weight loss per week and includes a negative energy balance of [phone] kcal/d;Understanding recommendations for meals to include 15-35% energy as protein, 25-35% energy from fat, 35-60% energy from carbohydrates, less than 281m of dietary cholesterol, 20-35 gm of total fiber daily;Understanding of distribution of calorie intake throughout the day with the consumption of 4-5 meals/snacks    Hypertension  Yes    Intervention  Provide education on lifestyle modifcations including regular physical activity/exercise, weight management, moderate sodium restriction and increased consumption of fresh fruit, vegetables, and low fat dairy, alcohol moderation, and smoking cessation.;Monitor prescription use compliance.    Expected Outcomes  Short Term: Continued assessment and intervention until BP is < 140/965mHG in hypertensive participants. < 130/8067mG in hypertensive participants with  diabetes, heart failure or chronic kidney disease.;Long Term: Maintenance of blood pressure at goal levels.    Lipids  Yes    Intervention  Provide education and support for participant on nutrition &  aerobic/resistive exercise along with prescribed medications to achieve LDL <62m, HDL >438m    Expected Outcomes  Short Term: Participant states understanding of desired cholesterol values and is compliant with medications prescribed. Participant is following exercise prescription and nutrition guidelines.;Long Term: Cholesterol controlled with medications as prescribed, with individualized exercise RX and with personalized nutrition plan. Value goals: LDL < 7093mHDL > 40 mg.        Personal Goals Discharge: Goals and Risk Factor Review - 01/21/17 1750      Core Components/Risk Factors/Patient Goals Review   Personal Goals Review  Weight Management/Obesity;Hypertension    Review  Blood pressure has been very stable in Cardiac Rehab. wt 185lbs. 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    Expected Outcomes  Dr. SamDurwin Regesl meet with the Cardiac REhab REgistered dietician. Healthy eating exlcuding red meat. Stable blood pressure and control his weight.       Exercise Goals and Review: Exercise Goals    Row Name 12/07/16 1511             Exercise Goals   Increase Physical Activity  Yes       Intervention  Provide advice, education, support and counseling about physical activity/exercise needs.;Develop an individualized exercise prescription for aerobic and resistive training based on initial evaluation findings, risk stratification, comorbidities and participant's personal goals.       Expected Outcomes  Achievement of increased cardiorespiratory fitness and enhanced flexibility, muscular endurance and strength shown through measurements of functional capacity and personal statement of participant.       Increase Strength and Stamina  Yes       Intervention  Provide advice, education, support and counseling about physical  activity/exercise needs.;Develop an individualized exercise prescription for aerobic and resistive training based on initial evaluation findings, risk stratification, comorbidities and participant's personal goals.       Expected Outcomes  Achievement of increased cardiorespiratory fitness and enhanced flexibility, muscular endurance and strength shown through measurements of functional capacity and personal statement of participant.       Able to understand and use rate of perceived exertion (RPE) scale  Yes       Intervention  Provide education and explanation on how to use RPE scale       Expected Outcomes  Short Term: Able to use RPE daily in rehab to express subjective intensity level;Long Term:  Able to use RPE to guide intensity level when exercising independently       Knowledge and understanding of Target Heart Rate Range (THRR)  Yes       Intervention  Provide education and explanation of THRR including how the numbers were predicted and where they are located for reference       Expected Outcomes  Short Term: Able to state/look up THRR;Long Term: Able to use THRR to govern intensity when exercising independently;Short Term: Able to use daily as guideline for intensity in rehab       Able to check pulse independently  Yes       Intervention  Provide education and demonstration on how to check pulse  in carotid and radial arteries.;Review the importance of being able to check your own pulse for safety during independent exercise       Expected Outcomes  Short Term: Able to explain why pulse checking is important during independent exercise;Long Term: Able to check pulse independently and accurately       Understanding of Exercise Prescription  Yes       Intervention  Provide education, explanation, and written materials on patient's individual exercise prescription       Expected Outcomes  Short Term: Able to explain program exercise prescription;Long Term: Able to explain home exercise  prescription to exercise independently          Nutrition & Weight - Outcomes: Pre Biometrics - 12/07/16 1511      Pre Biometrics   Height  5' 8.9" (1.75 m)    Weight  182 lb 1.6 oz (82.6 kg)    Waist Circumference  40.5 inches    Hip Circumference  40.75 inches    Waist to Hip Ratio  0.99 %    BMI (Calculated)  26.97    Single Leg Stand  2.03 seconds      Post Biometrics - 01/28/17 1631       Post  Biometrics   Height  5' 8.9" (1.75 m)    Weight  184 lb 11.2 oz (83.8 kg)    Waist Circumference  41 inches    Hip Circumference  41 inches    Waist to Hip Ratio  1 %    BMI (Calculated)  27.36    Single Leg Stand  2.11 seconds       Nutrition: Nutrition Therapy & Goals - 01/27/17 1606      Nutrition Therapy   Diet  TLC    Protein (specify units)  8oz    Fruits and Vegetables  5 servings/day    Sodium  1500 grams      Personal Nutrition Goals   Personal Goal #2  Eat plenty of vegetables and fruits with meals and snacks    Personal Goal #3  Try Minute Rice Multi Grain Medley for a healthy whole grain rice that is low in sodium    Comments  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 He is looking for protein alternatives to red meat, discussed options.       Intervention Plan   Intervention  Prescribe, educate and counsel regarding individualized specific dietary modifications aiming towards targeted core components such as weight, hypertension, lipid management, diabetes, heart failure and other comorbidities.;Nutrition handout(s) given to patient.    Expected Outcomes  Short Term Goal: Understand basic principles of dietary content, such as calories, fat, sodium, cholesterol and nutrients.;Short Term Goal: A plan has been developed with personal nutrition goals set during dietitian appointment.;Long Term Goal: Adherence to prescribed nutrition plan.       Nutrition Discharge: Nutrition Assessments -  02/02/17 1517      MEDFICTS Scores   Pre Score  98    Post Score  74    Score Difference  -24       Education Questionnaire Score: Knowledge Questionnaire Score - 02/02/17 1516      Knowledge Questionnaire Score   Post Score  27       Goals reviewed with patient; copy given to patient.

## 2017-02-03 ENCOUNTER — Encounter: Payer: Self-pay | Admitting: *Deleted

## 2017-02-03 ENCOUNTER — Encounter: Payer: Medicare Other | Admitting: *Deleted

## 2017-02-03 DIAGNOSIS — Z955 Presence of coronary angioplasty implant and graft: Secondary | ICD-10-CM

## 2017-02-03 DIAGNOSIS — I214 Non-ST elevation (NSTEMI) myocardial infarction: Secondary | ICD-10-CM

## 2017-02-03 DIAGNOSIS — I213 ST elevation (STEMI) myocardial infarction of unspecified site: Secondary | ICD-10-CM

## 2017-02-03 NOTE — Progress Notes (Signed)
Daily Session Note  Patient Details  Name: Donald Riley MRN: [id number] Date of Birth: 10/09/1933 Referring Provider:     Cardiac Rehab from 12/07/2016 in [facility] Cardiac and Pulmonary Rehab  Referring Provider  Bartholome Bill MD      Encounter Date: 02/03/2017  Check In: Session Check In - 02/03/17 1726      Check-In   Location  [facility]-Cardiac & Pulmonary Rehab    Staff Present  Renita Papa, RN Vickki Hearing, BA, ACSM CEP, Exercise Physiologist;Carroll Enterkin, RN, BSN    Supervising physician immediately available to respond to emergencies  See telemetry face sheet for immediately available ER MD    Medication changes reported      No    Fall or balance concerns reported     No    Warm-up and Cool-down  Performed on first and last piece of equipment    Resistance Training Performed  Yes    VAD Patient?  No      Pain Assessment   Currently in Pain?  No/denies    Multiple Pain Sites  No          Social History   Tobacco Use  Smoking Status Never Smoker  Smokeless Tobacco Never Used    Goals Met:  Independence with exercise equipment Exercise tolerated well No report of cardiac concerns or symptoms Strength training completed today  Goals Unmet:  Not Applicable  Comments:  Donald Riley graduated today from cardiac rehab with 36 sessions completed.  Details of the patient's exercise prescription and what he needs to do in order to continue the prescription and progress were discussed with patient.  Patient was given a copy of prescription and goals.  Patient verbalized understanding.  Donald Riley plans to continue to exercise by going to [employer].    Dr. Emily Filbert is Medical Director for [facility].

## 2017-02-03 NOTE — Progress Notes (Signed)
Discharge Progress Report  Patient Details  Name: Donald Riley MRN: [id number] Date of Birth: 13-Mar-1934 Referring Provider:     Cardiac Rehab from 12/07/2016 in [facility]  Referring Provider  Bartholome Bill MD       Number of Visits: 36  Reason for Discharge:  Patient reached a stable level of exercise. Patient independent in their exercise. Patient has met program and personal goals.  Smoking History:  Social History   Tobacco Use  Smoking Status Never Smoker  Smokeless Tobacco Never Used    Diagnosis:  ST elevation myocardial infarction (STEMI), unspecified artery (HCC)  Status post coronary artery stent placement  NSTEMI (non-ST elevated myocardial infarction) ([place])  ADL [facility]:   Initial Exercise Prescription: Initial Exercise Prescription - 12/07/16 1500      Date of Initial Exercise RX and Referring Provider   Date  12/07/16    Referring Provider  Bartholome Bill MD      Treadmill   MPH  1    Grade  0    Minutes  15    METs  1.77      NuStep   Level  1    SPM  80    Minutes  15    METs  1.5      Biostep-RELP   Level  1    SPM  50    Minutes  15    METs  2      Prescription Details   Frequency (times per week)  3    Duration  Progress to 45 minutes of aerobic exercise without signs/symptoms of physical distress      Intensity   THRR 40-80% of Max Heartrate  94-124    Ratings of Perceived Exertion  11-13    Perceived Dyspnea  0-4      Progression   Progression  Continue to progress workloads to maintain intensity without signs/symptoms of physical distress.      Resistance Training   Training Prescription  Yes    Weight  3 lbs    Reps  10-15       Discharge Exercise Prescription (Final Exercise Prescription Changes): Exercise Prescription Changes - 01/29/17 0900      Response to Exercise   Blood Pressure (Admit)  124/64    Blood Pressure (Exercise)  120/70    Blood Pressure (Exit)  120/70    Heart Rate  (Admit)  80 bpm    Heart Rate (Exercise)  102 bpm    Heart Rate (Exit)  76 bpm    Rating of Perceived Exertion (Exercise)  12    Symptoms  none    Duration  Continue with 45 min of aerobic exercise without signs/symptoms of physical distress.    Intensity  THRR unchanged      Progression   Progression  Continue to progress workloads to maintain intensity without signs/symptoms of physical distress.    Average METs  2.4      Resistance Training   Training Prescription  Yes    Weight  3 lb    Reps  10-15      NuStep   Level  4    SPM  80    Minutes  15    METs  2.8      Biostep-RELP   Level  1    SPM  50    Minutes  15    METs  2      Home Exercise Plan  Plans to continue exercise at  [facility] (comment)    Frequency  Add 2 additional days to program exercise sessions.    Initial Home Exercises Provided  12/31/16       Functional Capacity: 6 Minute Walk    Row Name 12/07/16 1506 01/28/17 1629       6 Minute Walk   Phase  Initial  Discharge    Distance  560 feet  765 feet    Distance % Change  -  36.6 %    Distance Feet Change  -  205 ft    Walk Time  6 minutes  6 minutes    # of Rest Breaks  0  0    MPH  -  1.45    METS  1.12  1.11    RPE  11  10    VO2 Peak  3.94  3.86    Symptoms  No  No    Resting HR  64 bpm  73 bpm    Resting BP  136/72  124/64    Resting Oxygen Saturation   96 %  -    Exercise Oxygen Saturation  during 6 min walk  95 %  95 %    Max Ex. HR  100 bpm  75 bpm    Max Ex. BP  138/74  120/70    2 Minute Post BP  124/70  -       Psychological, QOL, Others - Outcomes: PHQ 2/9: Depression screen PHQ 2/9 12/07/2016  Decreased Interest 1  Down, Depressed, Hopeless 1  PHQ - 2 Score 2  Altered sleeping 0  Tired, decreased energy 3  Change in appetite 0  Feeling bad or failure about yourself  0  Trouble concentrating 2  Moving slowly or fidgety/restless 0  Suicidal thoughts 0  PHQ-9 Score 7  Difficult doing work/chores Not  difficult at all    Quality of Life: Quality of Life - 02/02/17 1512      Quality of Life Scores   Health/Function Post  14.43 %    Socioeconomic Post  27.92 %    Psych/Spiritual Post  18.43 %    Family Post  27.6 %    GLOBAL Post  19.73 %       Personal Goals: Goals established at orientation with interventions provided to work toward goal. Personal Goals and Risk Factors at Admission - 12/07/16 1216      Core Components/Risk Factors/Patient Goals on Admission    Weight Management  Yes;Weight Loss    Intervention  Weight Management: Develop a combined nutrition and exercise program designed to reach desired caloric intake, while maintaining appropriate intake of nutrient and fiber, sodium and fats, and appropriate energy expenditure required for the weight goal.;Weight Management: Provide education and appropriate resources to help participant work on and attain dietary goals.    Admit Weight  182 lb 1.6 oz (82.6 kg)    Goal Weight: Short Term  180 lb (81.6 kg)    Goal Weight: Long Term  172 lb (78 kg)    Expected Outcomes  Short Term: Continue to assess and modify interventions until short term weight is achieved;Long Term: Adherence to nutrition and physical activity/exercise program aimed toward attainment of established weight goal;Weight Loss: Understanding of general recommendations for a balanced deficit meal plan, which promotes 1-2 lb weight loss per week and includes a negative energy balance of [phone] kcal/d;Understanding recommendations for meals to include 15-35% energy as protein, 25-35% energy  from fat, 35-60% energy from carbohydrates, less than 223m of dietary cholesterol, 20-35 gm of total fiber daily;Understanding of distribution of calorie intake throughout the day with the consumption of 4-5 meals/snacks    Hypertension  Yes    Intervention  Provide education on lifestyle modifcations including regular physical activity/exercise, weight management, moderate sodium  restriction and increased consumption of fresh fruit, vegetables, and low fat dairy, alcohol moderation, and smoking cessation.;Monitor prescription use compliance.    Expected Outcomes  Short Term: Continued assessment and intervention until BP is < 140/955mHG in hypertensive participants. < 130/8020mG in hypertensive participants with diabetes, heart failure or chronic kidney disease.;Long Term: Maintenance of blood pressure at goal levels.    Lipids  Yes    Intervention  Provide education and support for participant on nutrition & aerobic/resistive exercise along with prescribed medications to achieve LDL <23m60mDL >40mg48m Expected Outcomes  Short Term: Participant states understanding of desired cholesterol values and is compliant with medications prescribed. Participant is following exercise prescription and nutrition guidelines.;Long Term: Cholesterol controlled with medications as prescribed, with individualized exercise RX and with personalized nutrition plan. Value goals: LDL < 23mg,67m > 40 mg.        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  He is scheduling to meet with the RD.    Blood pressure has been very stable in Cardiac Rehab. wt 185lbs. 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      Expected Outcomes  Short - Donald Riley will meet with RD and get some healthy choices that address all health needs.  Long - Donald Riley will implement suggestions from RD  Dr. Sam ScDurwin Regeseet with the Cardiac REhab REgistered dietician. Healthy eating  exlcuding red meat. Stable blood pressure and control his weight.         Exercise Goals and Review: Exercise Goals    Row Name 12/07/16 1511             Exercise Goals   Increase Physical Activity  Yes       Intervention  Provide advice, education, support and counseling about physical activity/exercise needs.;Develop an individualized exercise prescription for aerobic and resistive training based on initial evaluation findings, risk stratification, comorbidities and participant's personal goals.       Expected Outcomes  Achievement of increased cardiorespiratory fitness and enhanced flexibility, muscular endurance and strength shown through measurements of functional capacity and personal statement of participant.       Increase Strength and Stamina  Yes       Intervention  Provide advice, education, support and counseling about physical activity/exercise needs.;Develop an individualized exercise prescription for aerobic and resistive training based on initial evaluation findings, risk stratification, comorbidities and participant's personal goals.       Expected Outcomes  Achievement of increased cardiorespiratory fitness and enhanced flexibility, muscular endurance and strength shown through measurements of functional capacity and personal statement of participant.       Able to understand  and use rate of perceived exertion (RPE) scale  Yes       Intervention  Provide education and explanation on how to use RPE scale       Expected Outcomes  Short Term: Able to use RPE daily in rehab to express subjective intensity level;Long Term:  Able to use RPE to guide intensity level when exercising independently       Knowledge and understanding of Target Heart Rate Range (THRR)  Yes       Intervention  Provide education and explanation of THRR including how the numbers were predicted and where they are located for reference       Expected Outcomes  Short Term: Able to state/look up THRR;Long Term:  Able to use THRR to govern intensity when exercising independently;Short Term: Able to use daily as guideline for intensity in rehab       Able to check pulse independently  Yes       Intervention  Provide education and demonstration on how to check pulse in carotid and radial arteries.;Review the importance of being able to check your own pulse for safety during independent exercise       Expected Outcomes  Short Term: Able to explain why pulse checking is important during independent exercise;Long Term: Able to check pulse independently and accurately       Understanding of Exercise Prescription  Yes       Intervention  Provide education, explanation, and written materials on patient's individual exercise prescription       Expected Outcomes  Short Term: Able to explain program exercise prescription;Long Term: Able to explain home exercise prescription to exercise independently          Nutrition & Weight - Outcomes: Pre Biometrics - 12/07/16 1511      Pre Biometrics   Height  5' 8.9" (1.75 m)    Weight  182 lb 1.6 oz (82.6 kg)    Waist Circumference  40.5 inches    Hip Circumference  40.75 inches    Waist to Hip Ratio  0.99 %    BMI (Calculated)  26.97    Single Leg Stand  2.03 seconds      Post Biometrics - 01/28/17 1631       Post  Biometrics   Height  5' 8.9" (1.75 m)    Weight  184 lb 11.2 oz (83.8 kg)    Waist Circumference  41 inches    Hip Circumference  41 inches    Waist to Hip Ratio  1 %    BMI (Calculated)  27.36    Single Leg Stand  2.11 seconds       Nutrition: Nutrition Therapy & Goals - 01/27/17 1606      Nutrition Therapy   Diet  TLC    Protein (specify units)  8oz    Fruits and Vegetables  5 servings/day    Sodium  1500 grams      Personal Nutrition Goals   Personal Goal #2  Eat plenty of vegetables and fruits with meals and snacks    Personal Goal #3  Try Minute Rice Multi Grain Medley for a healthy whole grain rice that is low in sodium     Comments  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 He is looking for protein alternatives to red meat, discussed options.       Intervention Plan   Intervention  Prescribe,  educate and counsel regarding individualized specific dietary modifications aiming towards targeted core components such as weight, hypertension, lipid management, diabetes, heart failure and other comorbidities.;Nutrition handout(s) given to patient.    Expected Outcomes  Short Term Goal: Understand basic principles of dietary content, such as calories, fat, sodium, cholesterol and nutrients.;Short Term Goal: A plan has been developed with personal nutrition goals set during dietitian appointment.;Long Term Goal: Adherence to prescribed nutrition plan.       Nutrition Discharge: Nutrition Assessments - 02/02/17 1517      MEDFICTS Scores   Pre Score  98    Post Score  74    Score Difference  -24       Education Questionnaire Score: Knowledge Questionnaire Score - 02/02/17 1516      Knowledge Questionnaire Score   Post Score  27       Goals reviewed with patient; copy given to patient.

## 2017-02-03 NOTE — Progress Notes (Signed)
Cardiac Individual Treatment Plan  Patient Details  Name: Donald Riley MRN: [id number] Date of Birth: 1933/12/05 Referring Provider:     Cardiac Rehab from 12/07/2016 in [facility] Cardiac and Pulmonary Rehab  Referring Provider  Bartholome Bill MD      Initial Encounter Date:    Cardiac Rehab from 12/07/2016 in [facility] Cardiac and Pulmonary Rehab  Date  12/07/16  Referring Provider  Bartholome Bill MD      Visit Diagnosis: ST elevation myocardial infarction (STEMI), unspecified artery [facility])  Status post coronary artery stent placement  Patient's Home Medications on Admission:  Current Outpatient Medications:  .  albuterol (VENTOLIN HFA) 108 (90 BASE) MCG/ACT inhaler, Inhale into the lungs every 4 (four) hours as needed. , Disp: , Rfl:  .  amiodarone (PACERONE) 200 MG tablet, , Disp: , Rfl:  .  aspirin 81 MG chewable tablet, Chew 1 tablet (81 mg total) by mouth daily., Disp: 30 tablet, Rfl: 2 .  atorvastatin (LIPITOR) 40 MG tablet, Take 1 tablet (40 mg total) by mouth daily at 6 PM., Disp: 30 tablet, Rfl: 2 .  brimonidine (ALPHAGAN P) 0.1 % SOLN, Alphagan P 0.1 % eye drops, Disp: , Rfl:  .  clopidogrel (PLAVIX) 75 MG tablet, Take 1 tablet (75 mg total) by mouth daily with breakfast., Disp: 30 tablet, Rfl: 2 .  dorzolamide-timolol (COSOPT) 22.3-6.8 MG/ML ophthalmic solution, , Disp: , Rfl:  .  EPINEPHrine 0.3 mg/0.3 mL IJ SOAJ injection, Inject 0.3 mLs (0.3 mg total) into the muscle once as needed., Disp: 6 Device, Rfl: 0 .  ferrous sulfate 325 (65 FE) MG tablet, Take 1 tablet (325 mg total) by mouth daily with breakfast., Disp: 30 tablet, Rfl: 2 .  furosemide (LASIX) 20 MG tablet, Take 1 tablet (20 mg total) by mouth daily., Disp: 30 tablet, Rfl: 2 .  latanoprost (XALATAN) 0.005 % ophthalmic solution, Place 1 drop into both eyes at bedtime. , Disp: , Rfl:  .  metoprolol succinate (TOPROL-XL) 25 MG 24 hr tablet, metoprolol succinate ER 25 mg tablet,extended release 24 hr  Take 1 tablet every day  by oral route., Disp: , Rfl:  .  mometasone-formoterol (DULERA) 200-5 MCG/ACT AERO, Dulera 200 mcg-5 mcg/actuation HFA aerosol inhaler, Disp: , Rfl:  .  MULTIPLE VITAMIN PO, Take by mouth., Disp: , Rfl:  .  oxyCODONE-acetaminophen (PERCOCET/ROXICET) 5-325 MG tablet, Take 1 tablet by mouth every 6 (six) hours as needed for moderate pain or severe pain (pain). (Patient not taking: Reported on 12/07/2016), Disp: 12 tablet, Rfl: 0 .  pantoprazole (PROTONIX) 40 MG tablet, Take 40 mg by mouth daily. Reported on 07/02/2015, Disp: , Rfl:  .  pramipexole (MIRAPEX) 0.25 MG tablet, Take 0.25-0.5 mg by mouth 2 (two) times daily. , Disp: , Rfl:  .  predniSONE (DELTASONE) 20 MG tablet, , Disp: , Rfl:  .  tiotropium (SPIRIVA HANDIHALER) 18 MCG inhalation capsule, Place 18 mcg into inhaler and inhale daily. , Disp: , Rfl:   Past Medical History: Past Medical History:  Diagnosis Date  . Arthritis   . Asthma   . Atrial fibrillation ([place])   . Cancer ([place])   . GERD (gastroesophageal reflux disease)   . Glaucoma   . Gout   . Heart murmur   . Hypertension   . Neuropathy   . Osteoporosis   . Sleep apnea     Tobacco Use: Social History   Tobacco Use  Smoking Status Never Smoker  Smokeless Tobacco Never Used    Labs:  Recent Review Flowsheet Data    Labs for ITP Cardiac and Pulmonary Rehab Latest Ref Rng & Units 10/01/2016 10/01/2016 10/01/2016   Cholestrol 0 - 200 mg/dL - - 201(H)   LDLCALC 0 - 99 mg/dL - - 105(H)   HDL >40 mg/dL - - 82   Trlycerides <150 mg/dL - 87 68   PHART 7.350 - 7.450 7.31(L) - -   PCO2ART 32.0 - 48.0 mmHg 43 - -   HCO3 20.0 - 28.0 mmol/L 21.7 - -   ACIDBASEDEF 0.0 - 2.0 mmol/L 4.6(H) - -   O2SAT % 99.5 - -       Exercise Target Goals:    Exercise Program Goal: Individual exercise prescription set with THRR, safety & activity barriers. Participant demonstrates ability to understand and report RPE using BORG scale, to self-measure pulse accurately, and to acknowledge the  importance of the exercise prescription.  Exercise Prescription Goal: Starting with aerobic activity 30 plus minutes a day, 3 days per week for initial exercise prescription. Provide home exercise prescription and guidelines that participant acknowledges understanding prior to discharge.  Activity Barriers & Risk Stratification: Activity Barriers & Cardiac Risk Stratification - 12/07/16 1224      Activity Barriers & Cardiac Risk Stratification   Activity Barriers  Back Problems;Neck/Spine Problems;Joint Problems;Arthritis;Other (comment);Balance Concerns;Assistive Device;Deconditioning;Muscular Weakness    Comments  Neuropathy    Cardiac Risk Stratification  High       6 Minute Walk: 6 Minute Walk    Row Name 12/07/16 1506 01/28/17 1629       6 Minute Walk   Phase  Initial  Discharge    Distance  560 feet  765 feet    Distance % Change  -  36.6 %    Distance Feet Change  -  205 ft    Walk Time  6 minutes  6 minutes    # of Rest Breaks  0  0    MPH  -  1.45    METS  1.12  1.11    RPE  11  10    VO2 Peak  3.94  3.86    Symptoms  No  No    Resting HR  64 bpm  73 bpm    Resting BP  136/72  124/64    Resting Oxygen Saturation   96 %  -    Exercise Oxygen Saturation  during 6 min walk  95 %  95 %    Max Ex. HR  100 bpm  75 bpm    Max Ex. BP  138/74  120/70    2 Minute Post BP  124/70  -       Oxygen Initial Assessment: Oxygen Initial Assessment - 01/08/17 0928      Home Oxygen   Home Oxygen Device  None    Home Exercise Oxygen Prescription  None    Home at Rest Exercise Oxygen Prescription  None      Initial 6 min Walk   Oxygen Used  None       Oxygen Re-Evaluation: Oxygen Re-Evaluation    Row Name 01/21/17 1750             Program Oxygen Prescription   Program Oxygen Prescription  None         Home Oxygen   Home Oxygen Device  None       Sleep Oxygen Prescription  None       Home Exercise Oxygen Prescription  None  Home at Rest Exercise Oxygen  Prescription  None          Oxygen Discharge (Final Oxygen Re-Evaluation): Oxygen Re-Evaluation - 01/21/17 1750      Program Oxygen Prescription   Program Oxygen Prescription  None      Home Oxygen   Home Oxygen Device  None    Sleep Oxygen Prescription  None    Home Exercise Oxygen Prescription  None    Home at Rest Exercise Oxygen Prescription  None       Initial Exercise Prescription: Initial Exercise Prescription - 12/07/16 1500      Date of Initial Exercise RX and Referring Provider   Date  12/07/16    Referring Provider  Bartholome Bill MD      Treadmill   MPH  1    Grade  0    Minutes  15    METs  1.77      NuStep   Level  1    SPM  80    Minutes  15    METs  1.5      Biostep-RELP   Level  1    SPM  50    Minutes  15    METs  2      Prescription Details   Frequency (times per week)  3    Duration  Progress to 45 minutes of aerobic exercise without signs/symptoms of physical distress      Intensity   THRR 40-80% of Max Heartrate  94-124    Ratings of Perceived Exertion  11-13    Perceived Dyspnea  0-4      Progression   Progression  Continue to progress workloads to maintain intensity without signs/symptoms of physical distress.      Resistance Training   Training Prescription  Yes    Weight  3 lbs    Reps  10-15       Perform Capillary Blood Glucose checks as needed.  Exercise Prescription Changes: Exercise Prescription Changes    Row Name 12/07/16 1500 12/16/16 1400 12/30/16 1500 01/15/17 1200 01/29/17 0900     Response to Exercise   Blood Pressure (Admit)  136/72  118/74  112/76  122/70  124/64   Blood Pressure (Exercise)  138/74  132/68  122/70  120/84  120/70   Blood Pressure (Exit)  124/70  110/64  130/68  104/74  120/70   Heart Rate (Admit)  64 bpm  77 bpm  82 bpm  83 bpm  80 bpm   Heart Rate (Exercise)  100 bpm  85 bpm  88 bpm  96 bpm  102 bpm   Heart Rate (Exit)  67 bpm  68 bpm  72 bpm  81 bpm  76 bpm   Oxygen Saturation (Admit)   96 %  -  -  -  -   Oxygen Saturation (Exercise)  95 %  -  -  -  -   Rating of Perceived Exertion (Exercise)  11  13  14  13  12    Symptoms  none  none  none  none  none   Comments  walk test results  -  -  -  -   Duration  -  -  -  Continue with 45 min of aerobic exercise without signs/symptoms of physical distress.  Continue with 45 min of aerobic exercise without signs/symptoms of physical distress.   Intensity  -  -  -  THRR unchanged  THRR unchanged  Progression   Progression  -  Continue to progress workloads to maintain intensity without signs/symptoms of physical distress.  Continue to progress workloads to maintain intensity without signs/symptoms of physical distress.  Continue to progress workloads to maintain intensity without signs/symptoms of physical distress.  Continue to progress workloads to maintain intensity without signs/symptoms of physical distress.   Average METs  -  2  2  2.6  2.4     Resistance Training   Training Prescription  -  Yes  Yes  Yes  Yes   Weight  -  3 lbs  3 lb  3 lb.  3 lb   Reps  -  10-15  10-15  10-15  10-15     Treadmill   MPH  -  -  -  1  -   Grade  -  -  -  0  -   Minutes  -  -  -  15  -   METs  -  -  -  1.77  -     NuStep   Level  -  1  4  4  4    SPM  -  80  80  80  80   Minutes  -  15  15  15  15    METs  -  2  -  2.9  2.8     Biostep-RELP   Level  -  1  1  1  1    SPM  -  50  50  50  50   Minutes  -  15  15  15  15    METs  -  2  2  3  2      Home Exercise Plan   Plans to continue exercise at  -  -  -  [employer] (comment)  [employer] (comment)   Frequency  -  -  -  Add 2 additional days to program exercise sessions.  Add 2 additional days to program exercise sessions.   Initial Home Exercises Provided  -  -  -  12/31/16  12/31/16      Exercise Comments: Exercise Comments    Row Name 12/09/16 1709 12/31/16 1653 01/21/17 1755       Exercise Comments  First full day of exercise!  Patient was oriented to gym and  equipment including functions, settings, policies, and procedures.  Patient's individual exercise prescription and treatment plan were reviewed.  All starting workloads were established based on the results of the 6 minute walk test done at initial orientation visit.  The plan for exercise progression was also introduced and progression will be customized based on patient's performance and goals.  Reviewed home exercise with pt today.  Pt plans [place].  Reviewed THR, pulse, RPE, sign and symptoms, NTG use, and when to call 911 or MD.  Also discussed weather considerations and indoor options.  Pt voiced understanding.  . Sam said he will exercise with his wife in [place] since she goes there 2 days a week. Sam said Cardiac Rehab has helped him and he has enjoyed it.        Exercise Goals and Review: Exercise Goals    Row Name 12/07/16 1511             Exercise Goals   Increase Physical Activity  Yes       Intervention  Provide advice, education, support and counseling about physical activity/exercise  needs.;Develop an individualized exercise prescription for aerobic and resistive training based on initial evaluation findings, risk stratification, comorbidities and participant's personal goals.       Expected Outcomes  Achievement of increased cardiorespiratory fitness and enhanced flexibility, muscular endurance and strength shown through measurements of functional capacity and personal statement of participant.       Increase Strength and Stamina  Yes       Intervention  Provide advice, education, support and counseling about physical activity/exercise needs.;Develop an individualized exercise prescription for aerobic and resistive training based on initial evaluation findings, risk stratification, comorbidities and participant's personal goals.       Expected Outcomes  Achievement of increased cardiorespiratory fitness and enhanced flexibility, muscular endurance and  strength shown through measurements of functional capacity and personal statement of participant.       Able to understand and use rate of perceived exertion (RPE) scale  Yes       Intervention  Provide education and explanation on how to use RPE scale       Expected Outcomes  Short Term: Able to use RPE daily in rehab to express subjective intensity level;Long Term:  Able to use RPE to guide intensity level when exercising independently       Knowledge and understanding of Target Heart Rate Range (THRR)  Yes       Intervention  Provide education and explanation of THRR including how the numbers were predicted and where they are located for reference       Expected Outcomes  Short Term: Able to state/look up THRR;Long Term: Able to use THRR to govern intensity when exercising independently;Short Term: Able to use daily as guideline for intensity in rehab       Able to check pulse independently  Yes       Intervention  Provide education and demonstration on how to check pulse in carotid and radial arteries.;Review the importance of being able to check your own pulse for safety during independent exercise       Expected Outcomes  Short Term: Able to explain why pulse checking is important during independent exercise;Long Term: Able to check pulse independently and accurately       Understanding of Exercise Prescription  Yes       Intervention  Provide education, explanation, and written materials on patient's individual exercise prescription       Expected Outcomes  Short Term: Able to explain program exercise prescription;Long Term: Able to explain home exercise prescription to exercise independently          Exercise Goals Re-Evaluation : Exercise Goals Re-Evaluation    Row Name 12/16/16 1501 12/30/16 1506 12/31/16 1653 01/15/17 1236 01/28/17 1633     Exercise Goal Re-Evaluation   Exercise Goals Review  Increase Physical Activity;Increase Strength and Stamina  Increase Physical Activity;Increase  Strength and Stamina  Increase Physical Activity;Increase Strength and Stamina;Able to understand and use rate of perceived exertion (RPE) scale;Knowledge and understanding of Target Heart Rate Range (THRR);Able to check pulse independently;Understanding of Exercise Prescription  Increase Physical Activity;Increase Strength and Stamina;Able to understand and use rate of perceived exertion (RPE) scale  Increase Physical Activity;Increase Strength and Stamina   Comments  Sam has tolerated exercise well - his RPE is in target range.  Sam is tolerating exercise well.  Staff wil encourage him to continue attending regularly.  Reviewed home exercise with pt today.  Pt plans to go [place] for [place].  Reviewed THR, pulse, RPE, sign and  symptoms, NTG use, and when to call 911 or MD.  Also discussed weather considerations and indoor options.  Pt voiced understanding.  Sam is progressing well and is up to Level 4 on NS.  Staff will continue to monitor progression.  6 min walk test done today. Results reviewed with patinet. See detailed report in 6 min walk data section. Patient increased walk distance by 205 feet.    Expected Outcomes  Short - Sam will continue to attend on a regular basis.  Long - Sam will increase overall fitness.  Short - Sam will continue to attend regularly.  Long - Sam will continue to build stamina.  Short - Sam will add 1 day of exercise outside of class.  Long - Sam will exercise on his own.  Short - Sam will continue to attend regularly.  Long - Sam will maintain fitness gains.  Short: Sam will graduate from cardiac rehab. Long: Sam will maintain fitness gains by exercising independently and safely at a community fitness center upon graduation.    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           Discharge Exercise Prescription (Final Exercise Prescription Changes): Exercise Prescription Changes - 01/29/17 0900      Response to Exercise   Blood Pressure (Admit)  124/64    Blood Pressure (Exercise)  120/70    Blood Pressure (Exit)  120/70    Heart Rate (Admit)  80 bpm    Heart Rate (Exercise)  102 bpm    Heart Rate (Exit)  76 bpm    Rating of Perceived Exertion (Exercise)  12    Symptoms  none    Duration  Continue with 45 min of aerobic exercise without signs/symptoms of physical distress.    Intensity  THRR unchanged      Progression   Progression  Continue to progress workloads to maintain intensity without signs/symptoms of physical distress.    Average METs  2.4      Resistance Training   Training Prescription  Yes    Weight  3 lb    Reps  10-15      NuStep   Level  4    SPM  80    Minutes  15    METs  2.8      Biostep-RELP   Level  1    SPM  50    Minutes  15    METs  2      Home Exercise Plan   Plans to continue exercise at  [facility] (comment)    Frequency  Add 2 additional days to program exercise sessions.    Initial Home Exercises Provided  12/31/16       Nutrition:  Target Goals: Understanding of nutrition guidelines, daily intake of sodium 1500mg , cholesterol 200mg , calories 30% from fat and 7% or less from saturated fats, daily to have 5 or more servings of fruits and vegetables.  Biometrics: Pre Biometrics - 12/07/16 1511      Pre Biometrics   Height  5' 8.9" (1.75  m)    Weight  182 lb 1.6 oz (82.6 kg)    Waist Circumference  40.5 inches    Hip Circumference  40.75 inches    Waist to Hip Ratio  0.99 %    BMI (Calculated)  26.97    Single Leg Stand  2.03 seconds      Post Biometrics - 01/28/17 1631       Post  Biometrics   Height  5' 8.9" (1.75 m)    Weight  184 lb 11.2 oz (83.8 kg)     Waist Circumference  41 inches    Hip Circumference  41 inches    Waist to Hip Ratio  1 %    BMI (Calculated)  27.36    Single Leg Stand  2.11 seconds       Nutrition Therapy Plan and Nutrition Goals: Nutrition Therapy & Goals - 01/27/17 1606      Nutrition Therapy   Diet  TLC    Protein (specify units)  8oz    Fruits and Vegetables  5 servings/day    Sodium  1500 grams      Personal Nutrition Goals   Personal Goal #2  Eat plenty of vegetables and fruits with meals and snacks    Personal Goal #3  Try Minute Rice Multi Grain Medley for a healthy whole grain rice that is low in sodium    Comments  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 He is looking for protein alternatives to red meat, discussed options.       Intervention Plan   Intervention  Prescribe, educate and counsel regarding individualized specific dietary modifications aiming towards targeted core components such as weight, hypertension, lipid management, diabetes, heart failure and other comorbidities.;Nutrition handout(s) given to patient.    Expected Outcomes  Short Term Goal: Understand basic principles of dietary content, such as calories, fat, sodium, cholesterol and nutrients.;Short Term Goal: A plan has been developed with personal nutrition goals set during dietitian appointment.;Long Term Goal: Adherence to prescribed nutrition plan.       Nutrition Discharge: Rate Your Plate Scores: Nutrition Assessments - 02/02/17 1517      MEDFICTS Scores   Pre Score  98    Post Score  74    Score Difference  -24       Nutrition Goals Re-Evaluation: Nutrition Goals Re-Evaluation    Row Name 12/31/16 1654 01/21/17 1755           Goals   Current Weight  -  185 lb (83.9 kg)      Nutrition Goal  -  Healthy eating excluding red meat since he had an anaphylactic reaction 6 hours after he ate a hamburger. Sam said he takes ticks off him all the  time and has had a [employer] Tic on him in the past.       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  RD will give suggestions for healthy diet based on all heallth factors for them to follow.  Long - Sam will implement changes suggested by RD  Sam will cont to eat healthy. 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         Nutrition Goals Discharge (Final  Nutrition Goals Re-Evaluation): Nutrition Goals Re-Evaluation - 01/21/17 1755      Goals   Current Weight  185 lb (83.9 kg)    Nutrition Goal  Healthy eating excluding red meat since he had an anaphylactic reaction 6 hours after he ate a hamburger. Sam said he takes ticks off him all the time and has had a Lonestar Tic on him in the past.     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       Psychosocial: Target Goals: Acknowledge presence or absence of significant depression and/or stress, maximize coping skills, provide positive support system. Participant is able to verbalize types and ability to use techniques and skills needed for reducing stress and depression.   Initial Review & Psychosocial Screening: Initial Psych Review & Screening - 12/07/16 1217      Initial Review   Current issues with  Current Stress Concerns    Source of Stress Concerns  Chronic Illness;Unable to perform yard/household activities      [place]?  Yes      Barriers   Psychosocial barriers to participate in program  The patient should benefit from training in stress management and relaxation.      Screening Interventions   Interventions  Encouraged to exercise;Yes    Expected Outcomes  Short Term goal: Utilizing psychosocial counselor, staff and physician to assist with identification of  specific Stressors or current issues interfering with healing process. Setting desired goal for each stressor or current issue identified.;Long Term Goal: Stressors or current issues are controlled or eliminated.;Short Term goal: Identification and review with participant of any Quality of Life or Depression concerns found by scoring the questionnaire.;Long Term goal: The participant improves quality of Life and PHQ9 Scores as seen by post scores and/or verbalization of changes       Quality of Life Scores:  Quality of Life - 02/02/17 1512      Quality of Life Scores   Health/Function Post  14.43 %    Socioeconomic Post  27.92 %    Psych/Spiritual Post  18.43 %    Family Post  27.6 %    GLOBAL Post  19.73 %       PHQ-9: Recent Review Flowsheet Data    Depression screen [facility] 2/9 12/07/2016   Decreased Interest 1   Down, Depressed, Hopeless 1   PHQ - 2 Score 2   Altered sleeping 0   Tired, decreased energy 3   Change in appetite 0   Feeling bad or failure about yourself  0   Trouble concentrating 2   Moving slowly or fidgety/restless 0   Suicidal thoughts 0   PHQ-9 Score 7   Difficult doing work/chores Not difficult at all     Interpretation of Total Score  Total Score Depression Severity:  1-4 = Minimal depression, 5-9 = Mild depression, 10-14 = Moderate depression, 15-19 = Moderately severe depression, 20-27 = Severe depression   Psychosocial Evaluation and Intervention: Psychosocial Evaluation - 02/01/17 1705      Discharge Psychosocial Assessment & Intervention   Comments  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  He reports experiencing progress in his legs being stronger and just feeling better/stronger overall.  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  His  PHQ-9 scores increased from a "7" to an "11" indicating increased symptoms of depression.  When questioned, he replied that he has been having a  problem with the agency that provides his CPAP for over a month and has been having sleep difficulties longer than that as a result.  A BiPap has been recommended instead but his Dr. delayed in approving that order.  He has an appointment about this in (2) days (Wednesday - 11/7) and hopes his sleep will improve.  This is impacting his ability to function overall.  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  Counselor recommended a therapist who provides treatment for PTSD and gave Dr. Nicki Reaper a card.  He agreed to contact them and was glad the recommendation did not involve "more medication."  He stated the lack of sleep is impacting his mood and he would like to address this.  Counselor commended Dr. Nicki Reaper for his progress made while in this program.   [facility] completion of this program.         Psychosocial Re-Evaluation: Psychosocial Re-Evaluation    Row Name 12/31/16 1658 01/21/17 1753           Psychosocial Re-Evaluation   Comments  Sam reports stress from the tick bite incident.  He is exercising regularly.  Sam said he knows they need to down size but he and his wife are not at that point yet. Sam said he will exercise with his wife in [place] since she goes there 2 days a week. Sam said Cardiac Rehab has helped him and he has enjoyed it.       Expected Outcomes  Short - Sam will continue in HT with exercise and education and meet with RD for dietary concerns. Long - Sam will implement all strategies into lifestyle  -         Psychosocial Discharge (Final Psychosocial Re-Evaluation): Psychosocial Re-Evaluation - 01/21/17 1753      Psychosocial Re-Evaluation   Comments  Sam said he knows they need to down size but he and his wife are not at that point yet. Sam said he will exercise with his wife in [place] since she goes there 2 days a week. Sam said Cardiac Rehab has helped him and he has enjoyed it.         Vocational Rehabilitation: Provide vocational rehab assistance to qualifying candidates.   Vocational Rehab Evaluation & Intervention: Vocational Rehab - 12/07/16 1215      Initial Vocational Rehab Evaluation & Intervention   Assessment shows need for Vocational Rehabilitation  No       Education: Education Goals: Education classes will be provided on a variety of topics geared toward better understanding of heart health and risk factor modification. Participant will state understanding/return demonstration of topics presented as noted by education test scores.  Learning Barriers/Preferences: Learning Barriers/Preferences - 12/07/16 1215      Learning Barriers/Preferences   Learning Barriers  Hearing    Learning Preferences  Video;Written Material       Education Topics: General Nutrition Guidelines/Fats and Fiber: -Group instruction provided by verbal, written material, models and posters to present the general guidelines for heart healthy nutrition. Gives an explanation and review of dietary fats and fiber.   Cardiac Rehab from 02/01/2017 in [facility] Cardiac and Pulmonary Rehab  Date  01/11/17  Educator  PI  Instruction Review Code  1- Verbalizes Understanding  Controlling Sodium/Reading Food Labels: -Group verbal and written material supporting the discussion of sodium use in heart healthy nutrition. Review and explanation with models, verbal and written materials for utilization of the food label.   Cardiac Rehab from 02/01/2017 in [facility] Cardiac and Pulmonary Rehab  Date  01/18/17  Educator  PI  Instruction Review Code  1- Verbalizes Understanding      Exercise Physiology & Risk Factors: - Group verbal and written instruction with models to review the exercise physiology of the cardiovascular system and associated critical values. Details cardiovascular disease risk factors and the goals associated with each risk factor.   Cardiac Rehab from 02/01/2017 in [facility]  Cardiac and Pulmonary Rehab  Date  01/25/17  Educator  [facility]  Instruction Review Code  1- Verbalizes Understanding      Aerobic Exercise & Resistance Training: - Gives group verbal and written discussion on the health impact of inactivity. On the components of aerobic and resistive training programs and the benefits of this training and how to safely progress through these programs.   Cardiac Rehab from 02/01/2017 in [facility] Cardiac and Pulmonary Rehab  Date  01/27/17  Educator  AS  Instruction Review Code  1- Verbalizes Understanding      Flexibility, Balance, General Exercise Guidelines: - Provides group verbal and written instruction on the benefits of flexibility and balance training programs. Provides general exercise guidelines with specific guidelines to those with heart or lung disease. Demonstration and skill practice provided.   Cardiac Rehab from 02/01/2017 in [facility] Cardiac and Pulmonary Rehab  Date  02/01/17  Educator  [facility]  Instruction Review Code  1- Verbalizes Understanding      Stress Management: - Provides group verbal and written instruction about the health risks of elevated stress, cause of high stress, and healthy ways to reduce stress.   Depression: - Provides group verbal and written instruction on the correlation between heart/lung disease and depressed mood, treatment options, and the stigmas associated with seeking treatment.   Cardiac Rehab from 02/01/2017 in [facility] Cardiac and Pulmonary Rehab  Date  01/13/17  Educator  [facility]  Instruction Review Code  1- Verbalizes Understanding      Anatomy & Physiology of the Heart: - Group verbal and written instruction and models provide basic cardiac anatomy and physiology, with the coronary electrical and arterial systems. Review of: AMI, Angina, Valve disease, Heart Failure, Cardiac Arrhythmia, Pacemakers, and the ICD.   Cardiac Rehab from 02/01/2017 in [facility] Cardiac and Pulmonary Rehab  Date  12/14/16  Educator  [facility]   Instruction Review Code  1- Verbalizes Understanding      Cardiac Procedures: - Group verbal and written instruction to review commonly prescribed medications for heart disease. Reviews the medication, class of the drug, and side effects. Includes the steps to properly store meds and maintain the prescription regimen. (beta blockers and nitrates)   Cardiac Rehab from 02/01/2017 in [facility] Cardiac and Pulmonary Rehab  Date  12/21/16  Educator  [facility]  Instruction Review Code  1- Verbalizes Understanding      Cardiac Medications I: - Group verbal and written instruction to review commonly prescribed medications for heart disease. Reviews the medication, class of the drug, and side effects. Includes the steps to properly store meds and maintain the prescription regimen.   Cardiac Rehab from 02/01/2017 in [facility] Cardiac and Pulmonary Rehab  Date  12/28/16  Educator  MA  Instruction Review Code  1- Verbalizes Understanding      Cardiac Medications II: -Group  verbal and written instruction to review commonly prescribed medications for heart disease. Reviews the medication, class of the drug, and side effects. (all other drug classes)    Go Sex-Intimacy & Heart Disease, Get SMART - Goal Setting: - Group verbal and written instruction through game format to discuss heart disease and the return to sexual intimacy. Provides group verbal and written material to discuss and apply goal setting through the application of the S.M.A.R.T. Method.   Cardiac Rehab from 02/01/2017 in [facility] Cardiac and Pulmonary Rehab  Date  12/21/16  Educator  [facility]  Instruction Review Code  1- Verbalizes Understanding      Other Matters of the Heart: - Provides group verbal, written materials and models to describe Heart Failure, Angina, Valve Disease, Peripheral Artery Disease, and Diabetes in the realm of heart disease. Includes description of the disease process and treatment options available to the cardiac patient.    Cardiac Rehab from 02/01/2017 in [facility] Cardiac and Pulmonary Rehab  Date  12/14/16  Educator  [facility]  Instruction Review Code  1- Verbalizes Understanding      Exercise & Equipment Safety: - Individual verbal instruction and demonstration of equipment use and safety with use of the equipment.   Cardiac Rehab from 02/01/2017 in [facility] Cardiac and Pulmonary Rehab  Date  12/07/16  Educator  C. EnterkinRN  Instruction Review Code  3- Needs Reinforcement      Infection Prevention: - Provides verbal and written material to individual with discussion of infection control including proper hand washing and proper equipment cleaning during exercise session.   Cardiac Rehab from 02/01/2017 in [facility] Cardiac and Pulmonary Rehab  Date  12/07/16  Educator  C. EnterkinRN  Instruction Review Code  1- Verbalizes Understanding      Falls Prevention: - Provides verbal and written material to individual with discussion of falls prevention and safety.   Cardiac Rehab from 02/01/2017 in [facility] Cardiac and Pulmonary Rehab  Date  12/07/16  Educator  C. ENterkinRN  Instruction Review Code  1- Verbalizes Understanding      Diabetes: - Individual verbal and written instruction to review signs/symptoms of diabetes, desired ranges of glucose level fasting, after meals and with exercise. Acknowledge that pre and post exercise glucose checks will be done for 3 sessions at entry of program.   Other: -Provides group and verbal instruction on various topics (see comments)    Knowledge Questionnaire Score: Knowledge Questionnaire Score - 02/02/17 1516      Knowledge Questionnaire Score   Post Score  27       Core Components/Risk Factors/Patient Goals at Admission: Personal Goals and Risk Factors at Admission - 12/07/16 1216      Core Components/Risk Factors/Patient Goals on Admission    Weight Management  Yes;Weight Loss    Intervention  Weight Management: Develop a combined nutrition and exercise program  designed to reach desired caloric intake, while maintaining appropriate intake of nutrient and fiber, sodium and fats, and appropriate energy expenditure required for the weight goal.;Weight Management: Provide education and appropriate resources to help participant work on and attain dietary goals.    Admit Weight  182 lb 1.6 oz (82.6 kg)    Goal Weight: Short Term  180 lb (81.6 kg)    Goal Weight: Long Term  172 lb (78 kg)    Expected Outcomes  Short Term: Continue to assess and modify interventions until short term weight is achieved;Long Term: Adherence to nutrition and physical activity/exercise program aimed toward attainment of established  weight goal;Weight Loss: Understanding of general recommendations for a balanced deficit meal plan, which promotes 1-2 lb weight loss per week and includes a negative energy balance of [phone] kcal/d;Understanding recommendations for meals to include 15-35% energy as protein, 25-35% energy from fat, 35-60% energy from carbohydrates, less than 200mg  of dietary cholesterol, 20-35 gm of total fiber daily;Understanding of distribution of calorie intake throughout the day with the consumption of 4-5 meals/snacks    Hypertension  Yes    Intervention  Provide education on lifestyle modifcations including regular physical activity/exercise, weight management, moderate sodium restriction and increased consumption of fresh fruit, vegetables, and low fat dairy, alcohol moderation, and smoking cessation.;Monitor prescription use compliance.    Expected Outcomes  Short Term: Continued assessment and intervention until BP is < 140/23mm HG in hypertensive participants. < 130/37mm HG in hypertensive participants with diabetes, heart failure or chronic kidney disease.;Long Term: Maintenance of blood pressure at goal levels.    Lipids  Yes    Intervention  Provide education and support for participant on nutrition & aerobic/resistive exercise along with prescribed medications to  achieve LDL 70mg , HDL >40mg .    Expected Outcomes  Short Term: Participant states understanding of desired cholesterol values and is compliant with medications prescribed. Participant is following exercise prescription and nutrition guidelines.;Long Term: Cholesterol controlled with medications as prescribed, with individualized exercise RX and with personalized nutrition plan. Value goals: LDL < 70mg , HDL > 40 mg.       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  He is scheduling to meet with the RD.    Blood pressure has been very stable in Cardiac Rehab. wt 185lbs. 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      Expected Outcomes  Short - Sam will meet with RD and get some healthy choices that address all health needs.  Long - Sam will implement suggestions from RD  Dr. Durwin Reges ill meet with the Cardiac REhab REgistered dietician. Healthy eating exlcuding red meat. Stable blood pressure and control his weight.         Core Components/Risk Factors/Patient Goals at Discharge (Final Review):  Goals and Risk Factor Review - 01/21/17 1750      Core Components/Risk Factors/Patient Goals Review   Personal Goals Review  Weight Management/Obesity;Hypertension    Review  Blood pressure has been very stable in Cardiac Rehab. wt 185lbs. 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     Expected Outcomes  Dr. Durwin Reges ill meet with the Cardiac REhab REgistered dietician. Healthy eating exlcuding red meat. Stable blood pressure and control his weight.       ITP Comments: ITP Comments    Row Name 12/07/16 1223 12/07/16 1227 12/09/16 1144 01/06/17 0827 01/21/17 1156   ITP Comments  ITP created during Medical Review after Cardiac  Rehab informed consent was signed.   Smitty Knudsen, M.D. 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   30 day review. Continue with ITP unless directed changes per Medical Director review.    30 day review. Continue with ITP unless directed changes per Medical Director Review.   I called and left a vm to let Dr. 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   [place] Name 02/03/17 0608           ITP Comments  30 day review. Continue with ITP unless directed changes per Medical Director review.           Comments:

## 2017-02-03 NOTE — Progress Notes (Signed)
Cardiac Individual Treatment Plan  Patient Details  Name: Donald Riley MRN: [id number] Date of Birth: July 08, 1933 Referring Provider:     Cardiac Rehab from 12/07/2016 in [facility] Cardiac and Pulmonary Rehab  Referring Provider  Bartholome Bill MD      Initial Encounter Date:    Cardiac Rehab from 12/07/2016 in [facility] Cardiac and Pulmonary Rehab  Date  12/07/16  Referring Provider  Bartholome Bill MD      Visit Diagnosis: ST elevation myocardial infarction (STEMI), unspecified artery [facility])  Status post coronary artery stent placement  NSTEMI (non-ST elevated myocardial infarction) ([place])  Patient's Home Medications on Admission:  Current Outpatient Medications:  .  albuterol (VENTOLIN HFA) 108 (90 BASE) MCG/ACT inhaler, Inhale into the lungs every 4 (four) hours as needed. , Disp: , Rfl:  .  amiodarone (PACERONE) 200 MG tablet, , Disp: , Rfl:  .  aspirin 81 MG chewable tablet, Chew 1 tablet (81 mg total) by mouth daily., Disp: 30 tablet, Rfl: 2 .  atorvastatin (LIPITOR) 40 MG tablet, Take 1 tablet (40 mg total) by mouth daily at 6 PM., Disp: 30 tablet, Rfl: 2 .  brimonidine (ALPHAGAN P) 0.1 % SOLN, Alphagan P 0.1 % eye drops, Disp: , Rfl:  .  clopidogrel (PLAVIX) 75 MG tablet, Take 1 tablet (75 mg total) by mouth daily with breakfast., Disp: 30 tablet, Rfl: 2 .  dorzolamide-timolol (COSOPT) 22.3-6.8 MG/ML ophthalmic solution, , Disp: , Rfl:  .  EPINEPHrine 0.3 mg/0.3 mL IJ SOAJ injection, Inject 0.3 mLs (0.3 mg total) into the muscle once as needed., Disp: 6 Device, Rfl: 0 .  ferrous sulfate 325 (65 FE) MG tablet, Take 1 tablet (325 mg total) by mouth daily with breakfast., Disp: 30 tablet, Rfl: 2 .  furosemide (LASIX) 20 MG tablet, Take 1 tablet (20 mg total) by mouth daily., Disp: 30 tablet, Rfl: 2 .  latanoprost (XALATAN) 0.005 % ophthalmic solution, Place 1 drop into both eyes at bedtime. , Disp: , Rfl:  .  metoprolol succinate (TOPROL-XL) 25 MG 24 hr tablet, metoprolol succinate ER 25 mg  tablet,extended release 24 hr  Take 1 tablet every day by oral route., Disp: , Rfl:  .  mometasone-formoterol (DULERA) 200-5 MCG/ACT AERO, Dulera 200 mcg-5 mcg/actuation HFA aerosol inhaler, Disp: , Rfl:  .  MULTIPLE VITAMIN PO, Take by mouth., Disp: , Rfl:  .  oxyCODONE-acetaminophen (PERCOCET/ROXICET) 5-325 MG tablet, Take 1 tablet by mouth every 6 (six) hours as needed for moderate pain or severe pain (pain). (Patient not taking: Reported on 12/07/2016), Disp: 12 tablet, Rfl: 0 .  pantoprazole (PROTONIX) 40 MG tablet, Take 40 mg by mouth daily. Reported on 07/02/2015, Disp: , Rfl:  .  pramipexole (MIRAPEX) 0.25 MG tablet, Take 0.25-0.5 mg by mouth 2 (two) times daily. , Disp: , Rfl:  .  predniSONE (DELTASONE) 20 MG tablet, , Disp: , Rfl:  .  tiotropium (SPIRIVA HANDIHALER) 18 MCG inhalation capsule, Place 18 mcg into inhaler and inhale daily. , Disp: , Rfl:   Past Medical History: Past Medical History:  Diagnosis Date  . Arthritis   . Asthma   . Atrial fibrillation ([place])   . Cancer ([place])   . GERD (gastroesophageal reflux disease)   . Glaucoma   . Gout   . Heart murmur   . Hypertension   . Neuropathy   . Osteoporosis   . Sleep apnea     Tobacco Use: Social History   Tobacco Use  Smoking Status Never Smoker  Smokeless  Tobacco Never Used    Labs: Recent Review Flowsheet Data    Labs for ITP Cardiac and Pulmonary Rehab Latest Ref Rng & Units 10/01/2016 10/01/2016 10/01/2016   Cholestrol 0 - 200 mg/dL - - 201(H)   LDLCALC 0 - 99 mg/dL - - 105(H)   HDL >40 mg/dL - - 82   Trlycerides <150 mg/dL - 87 68   PHART 7.350 - 7.450 7.31(L) - -   PCO2ART 32.0 - 48.0 mmHg 43 - -   HCO3 20.0 - 28.0 mmol/L 21.7 - -   ACIDBASEDEF 0.0 - 2.0 mmol/L 4.6(H) - -   O2SAT % 99.5 - -       Exercise Target Goals:    Exercise Program Goal: Individual exercise prescription set with THRR, safety & activity barriers. Participant demonstrates ability to understand and report RPE using BORG scale, to  self-measure pulse accurately, and to acknowledge the importance of the exercise prescription.  Exercise Prescription Goal: Starting with aerobic activity 30 plus minutes a day, 3 days per week for initial exercise prescription. Provide home exercise prescription and guidelines that participant acknowledges understanding prior to discharge.  Activity Barriers & Risk Stratification: Activity Barriers & Cardiac Risk Stratification - 12/07/16 1224      Activity Barriers & Cardiac Risk Stratification   Activity Barriers  Back Problems;Neck/Spine Problems;Joint Problems;Arthritis;Other (comment);Balance Concerns;Assistive Device;Deconditioning;Muscular Weakness    Comments  Neuropathy    Cardiac Risk Stratification  High       6 Minute Walk: 6 Minute Walk    Row Name 12/07/16 1506 01/28/17 1629       6 Minute Walk   Phase  Initial  Discharge    Distance  560 feet  765 feet    Distance % Change  -  36.6 %    Distance Feet Change  -  205 ft    Walk Time  6 minutes  6 minutes    # of Rest Breaks  0  0    MPH  -  1.45    METS  1.12  1.11    RPE  11  10    VO2 Peak  3.94  3.86    Symptoms  No  No    Resting HR  64 bpm  73 bpm    Resting BP  136/72  124/64    Resting Oxygen Saturation   96 %  -    Exercise Oxygen Saturation  during 6 min walk  95 %  95 %    Max Ex. HR  100 bpm  75 bpm    Max Ex. BP  138/74  120/70    2 Minute Post BP  124/70  -       Oxygen Initial Assessment: Oxygen Initial Assessment - 01/08/17 0928      Home Oxygen   Home Oxygen Device  None    Home Exercise Oxygen Prescription  None    Home at Rest Exercise Oxygen Prescription  None      Initial 6 min Walk   Oxygen Used  None       Oxygen Re-Evaluation: Oxygen Re-Evaluation    Row Name 01/21/17 1750             Program Oxygen Prescription   Program Oxygen Prescription  None         Home Oxygen   Home Oxygen Device  None       Sleep Oxygen Prescription  None       Home  Exercise Oxygen  Prescription  None       Home at Rest Exercise Oxygen Prescription  None          Oxygen Discharge (Final Oxygen Re-Evaluation): Oxygen Re-Evaluation - 01/21/17 1750      Program Oxygen Prescription   Program Oxygen Prescription  None      Home Oxygen   Home Oxygen Device  None    Sleep Oxygen Prescription  None    Home Exercise Oxygen Prescription  None    Home at Rest Exercise Oxygen Prescription  None       Initial Exercise Prescription: Initial Exercise Prescription - 12/07/16 1500      Date of Initial Exercise RX and Referring Provider   Date  12/07/16    Referring Provider  Bartholome Bill MD      Treadmill   MPH  1    Grade  0    Minutes  15    METs  1.77      NuStep   Level  1    SPM  80    Minutes  15    METs  1.5      Biostep-RELP   Level  1    SPM  50    Minutes  15    METs  2      Prescription Details   Frequency (times per week)  3    Duration  Progress to 45 minutes of aerobic exercise without signs/symptoms of physical distress      Intensity   THRR 40-80% of Max Heartrate  94-124    Ratings of Perceived Exertion  11-13    Perceived Dyspnea  0-4      Progression   Progression  Continue to progress workloads to maintain intensity without signs/symptoms of physical distress.      Resistance Training   Training Prescription  Yes    Weight  3 lbs    Reps  10-15       Perform Capillary Blood Glucose checks as needed.  Exercise Prescription Changes: Exercise Prescription Changes    Row Name 12/07/16 1500 12/16/16 1400 12/30/16 1500 01/15/17 1200 01/29/17 0900     Response to Exercise   Blood Pressure (Admit)  136/72  118/74  112/76  122/70  124/64   Blood Pressure (Exercise)  138/74  132/68  122/70  120/84  120/70   Blood Pressure (Exit)  124/70  110/64  130/68  104/74  120/70   Heart Rate (Admit)  64 bpm  77 bpm  82 bpm  83 bpm  80 bpm   Heart Rate (Exercise)  100 bpm  85 bpm  88 bpm  96 bpm  102 bpm   Heart Rate (Exit)  67 bpm  68  bpm  72 bpm  81 bpm  76 bpm   Oxygen Saturation (Admit)  96 %  -  -  -  -   Oxygen Saturation (Exercise)  95 %  -  -  -  -   Rating of Perceived Exertion (Exercise)  11  13  14  13  12    Symptoms  none  none  none  none  none   Comments  walk test results  -  -  -  -   Duration  -  -  -  Continue with 45 min of aerobic exercise without signs/symptoms of physical distress.  Continue with 45 min of aerobic exercise without signs/symptoms of physical distress.   Intensity  -  -  -  THRR unchanged  THRR unchanged     Progression   Progression  -  Continue to progress workloads to maintain intensity without signs/symptoms of physical distress.  Continue to progress workloads to maintain intensity without signs/symptoms of physical distress.  Continue to progress workloads to maintain intensity without signs/symptoms of physical distress.  Continue to progress workloads to maintain intensity without signs/symptoms of physical distress.   Average METs  -  2  2  2.6  2.4     Resistance Training   Training Prescription  -  Yes  Yes  Yes  Yes   Weight  -  3 lbs  3 lb  3 lb.  3 lb   Reps  -  10-15  10-15  10-15  10-15     Treadmill   MPH  -  -  -  1  -   Grade  -  -  -  0  -   Minutes  -  -  -  15  -   METs  -  -  -  1.77  -     NuStep   Level  -  1  4  4  4    SPM  -  80  80  80  80   Minutes  -  15  15  15  15    METs  -  2  -  2.9  2.8     Biostep-RELP   Level  -  1  1  1  1    SPM  -  50  50  50  50   Minutes  -  15  15  15  15    METs  -  2  2  3  2      Home Exercise Plan   Plans to continue exercise at  -  -  -  [employer] (comment)  [employer] (comment)   Frequency  -  -  -  Add 2 additional days to program exercise sessions.  Add 2 additional days to program exercise sessions.   Initial Home Exercises Provided  -  -  -  12/31/16  12/31/16      Exercise Comments: Exercise Comments    Row Name 12/09/16 1709 12/31/16 1653 01/21/17 1755       Exercise Comments   First full day of exercise!  Patient was oriented to gym and equipment including functions, settings, policies, and procedures.  Patient's individual exercise prescription and treatment plan were reviewed.  All starting workloads were established based on the results of the 6 minute walk test done at initial orientation visit.  The plan for exercise progression was also introduced and progression will be customized based on patient's performance and goals.  Reviewed home exercise with pt today.  Pt plans to go to [place] for exercise.  Reviewed THR, pulse, RPE, sign and symptoms, NTG use, and when to call 911 or MD.  Also discussed weather considerations and indoor options.  Pt voiced understanding.  . Donald Riley said he will exercise with his wife in [place] since she goes there 2 days a week. Donald Riley said Cardiac Rehab has helped him and he has enjoyed it.        Exercise Goals and Review: Exercise Goals    Row Name 12/07/16 1511             Exercise Goals   Increase Physical Activity  Yes       Intervention  Provide advice, education, support and counseling about physical activity/exercise needs.;Develop an individualized exercise prescription for aerobic and resistive training based on initial evaluation findings, risk stratification, comorbidities and participant's personal goals.       Expected Outcomes  Achievement of increased cardiorespiratory fitness and enhanced flexibility, muscular endurance and strength shown through measurements of functional capacity and personal statement of participant.       Increase Strength and Stamina  Yes       Intervention  Provide advice, education, support and counseling about physical activity/exercise needs.;Develop an individualized exercise prescription for aerobic and resistive training based on initial evaluation findings, risk stratification, comorbidities and participant's personal goals.       Expected Outcomes  Achievement of increased  cardiorespiratory fitness and enhanced flexibility, muscular endurance and strength shown through measurements of functional capacity and personal statement of participant.       Able to understand and use rate of perceived exertion (RPE) scale  Yes       Intervention  Provide education and explanation on how to use RPE scale       Expected Outcomes  Short Term: Able to use RPE daily in rehab to express subjective intensity level;Long Term:  Able to use RPE to guide intensity level when exercising independently       Knowledge and understanding of Target Heart Rate Range (THRR)  Yes       Intervention  Provide education and explanation of THRR including how the numbers were predicted and where they are located for reference       Expected Outcomes  Short Term: Able to state/look up THRR;Long Term: Able to use THRR to govern intensity when exercising independently;Short Term: Able to use daily as guideline for intensity in rehab       Able to check pulse independently  Yes       Intervention  Provide education and demonstration on how to check pulse in carotid and radial arteries.;Review the importance of being able to check your own pulse for safety during independent exercise       Expected Outcomes  Short Term: Able to explain why pulse checking is important during independent exercise;Long Term: Able to check pulse independently and accurately       Understanding of Exercise Prescription  Yes       Intervention  Provide education, explanation, and written materials on patient's individual exercise prescription       Expected Outcomes  Short Term: Able to explain program exercise prescription;Long Term: Able to explain home exercise prescription to exercise independently          Exercise Goals Re-Evaluation : Exercise Goals Re-Evaluation    Row Name 12/16/16 1501 12/30/16 1506 12/31/16 1653 01/15/17 1236 01/28/17 1633     Exercise Goal Re-Evaluation   Exercise Goals Review  Increase Physical  Activity;Increase Strength and Stamina  Increase Physical Activity;Increase Strength and Stamina  Increase Physical Activity;Increase Strength and Stamina;Able to understand and use rate of perceived exertion (RPE) scale;Knowledge and understanding of Target Heart Rate Range (THRR);Able to check pulse independently;Understanding of Exercise Prescription  Increase Physical Activity;Increase Strength and Stamina;Able to understand and use rate of perceived exertion (RPE) scale  Increase Physical Activity;Increase Strength and Stamina   Comments  Donald Riley has tolerated exercise well - his RPE is in target range.  Donald Riley is tolerating exercise well.  Staff wil encourage him to continue attending regularly.  Reviewed home exercise with pt today.  Pt plans to go to [place]  for exercise.  Reviewed THR, pulse, RPE, sign and symptoms, NTG use, and when to call 911 or MD.  Also discussed weather considerations and indoor options.  Pt voiced understanding.  Donald Riley is progressing well and is up to Level 4 on NS.  Staff will continue to monitor progression.  6 min walk test done today. Results reviewed with patinet. See detailed report in 6 min walk data section. Patient increased walk distance by 205 feet.    Expected Outcomes  Short - Donald Riley will continue to attend on a regular basis.  Long - Donald Riley will increase overall fitness.  Short - Donald Riley will continue to attend regularly.  Long - Donald Riley will continue to build stamina.  Short - Donald Riley will add 1 day of exercise outside of class.  Long - Donald Riley will exercise on his own.  Short - Donald Riley will continue to attend regularly.  Long - Donald Riley will maintain fitness gains.  Short: Donald Riley will graduate from cardiac rehab. Long: Donald Riley will maintain fitness gains by exercising independently and safely at a community fitness center upon graduation.    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           Discharge Exercise Prescription (Final Exercise Prescription Changes): Exercise Prescription Changes - 01/29/17 0900      Response to Exercise   Blood Pressure (Admit)  124/64    Blood Pressure (Exercise)  120/70    Blood Pressure (Exit)  120/70    Heart Rate (Admit)  80 bpm    Heart Rate (Exercise)  102 bpm    Heart Rate (Exit)  76 bpm    Rating of Perceived Exertion (Exercise)  12    Symptoms  none    Duration  Continue with 45 min of aerobic exercise without signs/symptoms of physical distress.    Intensity  THRR unchanged      Progression   Progression  Continue to progress workloads to maintain intensity without signs/symptoms of physical distress.    Average METs  2.4      Resistance Training   Training Prescription  Yes    Weight  3 lb    Reps  10-15      NuStep   Level  4    SPM  80    Minutes  15    METs  2.8      Biostep-RELP   Level  1    SPM  50    Minutes  15    METs  2      Home Exercise Plan   Plans to continue exercise at  [facility] (comment)    Frequency  Add 2 additional days to program exercise sessions.    Initial Home Exercises Provided  12/31/16       Nutrition:  Target Goals: Understanding of nutrition guidelines, daily intake of sodium 1500mg , cholesterol 200mg , calories 30% from fat and 7% or less from saturated fats, daily to have 5 or more servings of fruits and vegetables.  Biometrics: Pre Biometrics - 12/07/16 1511  Pre Biometrics   Height  5' 8.9" (1.75 m)    Weight  182 lb 1.6 oz (82.6 kg)    Waist Circumference  40.5 inches    Hip Circumference  40.75 inches    Waist to Hip Ratio  0.99 %    BMI (Calculated)  26.97    Single Leg Stand  2.03 seconds      Post Biometrics - 01/28/17 1631        Post  Biometrics   Height  5' 8.9" (1.75 m)    Weight  184 lb 11.2 oz (83.8 kg)    Waist Circumference  41 inches    Hip Circumference  41 inches    Waist to Hip Ratio  1 %    BMI (Calculated)  27.36    Single Leg Stand  2.11 seconds       Nutrition Therapy Plan and Nutrition Goals: Nutrition Therapy & Goals - 01/27/17 1606      Nutrition Therapy   Diet  TLC    Protein (specify units)  8oz    Fruits and Vegetables  5 servings/day    Sodium  1500 grams      Personal Nutrition Goals   Personal Goal #2  Eat plenty of vegetables and fruits with meals and snacks    Personal Goal #3  Try Minute Rice Multi Grain Medley for a healthy whole grain rice that is low in sodium    Comments  Donald Riley and his wife were both present at the visit, they seem to be following heart healthy eating pattern. He is looking for protein alternatives to red meat, discussed options.       Intervention Plan   Intervention  Prescribe, educate and counsel regarding individualized specific dietary modifications aiming towards targeted core components such as weight, hypertension, lipid management, diabetes, heart failure and other comorbidities.;Nutrition handout(s) given to patient.    Expected Outcomes  Short Term Goal: Understand basic principles of dietary content, such as calories, fat, sodium, cholesterol and nutrients.;Short Term Goal: A plan has been developed with personal nutrition goals set during dietitian appointment.;Long Term Goal: Adherence to prescribed nutrition plan.       Nutrition Discharge: Rate Your Plate Scores: Nutrition Assessments - 02/02/17 1517      MEDFICTS Scores   Pre Score  98    Post Score  74    Score Difference  -24       Nutrition Goals Re-Evaluation: Nutrition Goals Re-Evaluation    Row Name 12/31/16 1654 01/21/17 1755           Goals   Current Weight  -  185 lb (83.9 kg)      Nutrition Goal  -  Healthy eating excluding red meat since he had an anaphylactic  reaction 6 hours after he ate a hamburger. Donald Riley said he takes ticks off him all the time and has had a [employer] Tic on him in the past.       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  RD will give suggestions for healthy diet based on all heallth factors for them to follow.  Long - Donald Riley will implement changes suggested by RD  Donald Riley will cont to eat healthy. 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  Nutrition Goals Discharge (Final Nutrition Goals Re-Evaluation): Nutrition Goals Re-Evaluation - 01/21/17 1755      Goals   Current Weight  185 lb (83.9 kg)    Nutrition Goal  Healthy eating excluding red meat since he had an anaphylactic reaction 6 hours after he ate a hamburger. Donald Riley said he takes ticks off him all the time and has had a [employer] Tic on him in the past.     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       Psychosocial: Target Goals: Acknowledge presence or absence of significant depression and/or stress, maximize coping skills, provide positive support system. Participant is able to verbalize types and ability to use techniques and skills needed for reducing stress and depression.   Initial Review & Psychosocial Screening: Initial Psych Review & Screening - 12/07/16 1217      Initial Review   Current issues with  Current Stress Concerns    Source of Stress Concerns  Chronic Illness;Unable to perform yard/household activities      [place]?  Yes      Barriers   Psychosocial barriers to participate in program  The patient should benefit from training in stress management and relaxation.      Screening Interventions   Interventions  Encouraged to exercise;Yes    Expected Outcomes  Short Term goal:  Utilizing psychosocial counselor, staff and physician to assist with identification of specific Stressors or current issues interfering with healing process. Setting desired goal for each stressor or current issue identified.;Long Term Goal: Stressors or current issues are controlled or eliminated.;Short Term goal: Identification and review with participant of any Quality of Life or Depression concerns found by scoring the questionnaire.;Long Term goal: The participant improves quality of Life and PHQ9 Scores as seen by post scores and/or verbalization of changes       Quality of Life Scores:  Quality of Life - 02/02/17 1512      Quality of Life Scores   Health/Function Post  14.43 %    Socioeconomic Post  27.92 %    Psych/Spiritual Post  18.43 %    Family Post  27.6 %    GLOBAL Post  19.73 %       PHQ-9: Recent Review Flowsheet Data    Depression screen [facility] 2/9 12/07/2016   Decreased Interest 1   Down, Depressed, Hopeless 1   PHQ - 2 Score 2   Altered sleeping 0   Tired, decreased energy 3   Change in appetite 0   Feeling bad or failure about yourself  0   Trouble concentrating 2   Moving slowly or fidgety/restless 0   Suicidal thoughts 0   PHQ-9 Score 7   Difficult doing work/chores Not difficult at all     Interpretation of Total Score  Total Score Depression Severity:  1-4 = Minimal depression, 5-9 = Mild depression, 10-14 = Moderate depression, 15-19 = Moderately severe depression, 20-27 = Severe depression   Psychosocial Evaluation and Intervention: Psychosocial Evaluation - 02/01/17 1705      Discharge Psychosocial Assessment & Intervention   Comments  Counselor follow up with Donald Riley today who will be completing this program soon.  He reports experiencing progress in his legs being stronger and just feeling better/stronger overall.  Donald Riley also enjoyed the educational components of  this program.  His PHQ-9 scores increased from a "7" to an "11" indicating  increased symptoms of depression.  When questioned, he replied that he has been having a problem with the agency that provides his CPAP for over a month and has been having sleep difficulties longer than that as a result.  A BiPap has been recommended instead but his Dr. delayed in approving that order.  He has an appointment about this in (2) days (Wednesday - 11/7) and hopes his sleep will improve.  This is impacting his ability to function overall.  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  Counselor recommended a therapist who provides treatment for PTSD and gave Donald Riley a card.  He agreed to contact them and was glad the recommendation did not involve "more medication."  He stated the lack of sleep is impacting his mood and he would like to address this.  Counselor commended Donald Riley for his progress made while in this program.   [facility] completion of this program.         Psychosocial Re-Evaluation: Psychosocial Re-Evaluation    Row Name 12/31/16 1658 01/21/17 1753           Psychosocial Re-Evaluation   Comments  Donald Riley reports stress from the tick bite incident.  He is exercising regularly.  Donald Riley said he knows they need to down size but he and his wife are not at that point yet. Donald Riley said he will exercise with his wife in [place] since she goes there 2 days a week. Donald Riley said Cardiac Rehab has helped him and he has enjoyed it.       Expected Outcomes  Short - Donald Riley will continue in HT with exercise and education and meet with RD for dietary concerns. Long - Donald Riley will implement all strategies into lifestyle  -         Psychosocial Discharge (Final Psychosocial Re-Evaluation): Psychosocial Re-Evaluation - 01/21/17 1753      Psychosocial Re-Evaluation   Comments  Donald Riley said he knows they need to down size but he and his wife are not at that point yet. Donald Riley said he will exercise with his wife in [place] since she goes  there 2 days a week. Donald Riley said Cardiac Rehab has helped him and he has enjoyed it.        Vocational Rehabilitation: Provide vocational rehab assistance to qualifying candidates.   Vocational Rehab Evaluation & Intervention: Vocational Rehab - 12/07/16 1215      Initial Vocational Rehab Evaluation & Intervention   Assessment shows need for Vocational Rehabilitation  No       Education: Education Goals: Education classes will be provided on a variety of topics geared toward better understanding of heart health and risk factor modification. Participant will state understanding/return demonstration of topics presented as noted by education test scores.  Learning Barriers/Preferences: Learning Barriers/Preferences - 12/07/16 1215      Learning Barriers/Preferences   Learning Barriers  Hearing    Learning Preferences  Video;Written Material       Education Topics: General Nutrition Guidelines/Fats and Fiber: -Group instruction provided by verbal, written material, models and posters to present the general guidelines for heart healthy nutrition. Gives an explanation and review of dietary fats and fiber.   Cardiac Rehab from 02/03/2017 in [facility] Cardiac and Pulmonary Rehab  Date  01/11/17  Educator  PI  Instruction Review Code  1- Verbalizes Understanding      Controlling Sodium/Reading Food Labels: -Group verbal and written material supporting the discussion of sodium use in heart healthy nutrition. Review and explanation with models, verbal and written materials for utilization of the food label.   Cardiac Rehab from 02/03/2017 in [facility] Cardiac and Pulmonary Rehab  Date  01/18/17  Educator  PI  Instruction Review Code  1- Verbalizes Understanding      Exercise Physiology & Risk Factors: - Group verbal and written instruction with models to review the exercise physiology of the cardiovascular system and associated critical values. Details cardiovascular disease risk factors and the  goals associated with each risk factor.   Cardiac Rehab from 02/03/2017 in [facility] Cardiac and Pulmonary Rehab  Date  01/25/17  Educator  [facility]  Instruction Review Code  1- Verbalizes Understanding      Aerobic Exercise & Resistance Training: - Gives group verbal and written discussion on the health impact of inactivity. On the components of aerobic and resistive training programs and the benefits of this training and how to safely progress through these programs.   Cardiac Rehab from 02/03/2017 in [facility] Cardiac and Pulmonary Rehab  Date  01/27/17  Educator  AS  Instruction Review Code  1- Verbalizes Understanding      Flexibility, Balance, General Exercise Guidelines: - Provides group verbal and written instruction on the benefits of flexibility and balance training programs. Provides general exercise guidelines with specific guidelines to those with heart or lung disease. Demonstration and skill practice provided.   Cardiac Rehab from 02/03/2017 in [facility] Cardiac and Pulmonary Rehab  Date  02/01/17  Educator  [facility]  Instruction Review Code  1- Verbalizes Understanding      Stress Management: - Provides group verbal and written instruction about the health risks of elevated stress, cause of high stress, and healthy ways to reduce stress.   Depression: - Provides group verbal and written instruction on the correlation between heart/lung disease and depressed mood, treatment options, and the stigmas associated with seeking treatment.   Cardiac Rehab from 02/03/2017 in [facility] Cardiac and Pulmonary Rehab  Date  01/13/17  Educator  [facility]  Instruction Review Code  1- Verbalizes Understanding      Anatomy & Physiology of the Heart: - Group verbal and written instruction and models provide basic cardiac anatomy and physiology, with the coronary electrical and arterial systems. Review of: AMI, Angina, Valve disease, Heart Failure, Cardiac Arrhythmia, Pacemakers, and the ICD.   Cardiac Rehab from  02/03/2017 in [facility] Cardiac and Pulmonary Rehab  Date  12/14/16  Educator  [facility]  Instruction Review Code  1- Verbalizes Understanding      Cardiac Procedures: - Group verbal and written instruction to review commonly prescribed medications for heart disease. Reviews the medication, class of the drug, and side effects. Includes the steps to properly store meds and maintain the prescription regimen. (beta blockers and nitrates)   Cardiac Rehab from 02/03/2017 in [facility] Cardiac and Pulmonary Rehab  Date  12/21/16  Educator  [facility]  Instruction Review Code  1- Verbalizes Understanding      Cardiac Medications I: - Group verbal and written instruction to review commonly prescribed medications for heart disease. Reviews the medication, class of the drug, and side effects. Includes the steps to properly store meds and maintain the prescription regimen.   Cardiac Rehab from 02/03/2017 in [facility] Cardiac and Pulmonary Rehab  Date  12/28/16  Educator  MA  Instruction Review Code  1- Verbalizes Understanding  Cardiac Medications II: -Group verbal and written instruction to review commonly prescribed medications for heart disease. Reviews the medication, class of the drug, and side effects. (all other drug classes)    Go Sex-Intimacy & Heart Disease, Get SMART - Goal Setting: - Group verbal and written instruction through game format to discuss heart disease and the return to sexual intimacy. Provides group verbal and written material to discuss and apply goal setting through the application of the S.M.A.R.T. Method.   Cardiac Rehab from 02/03/2017 in [facility] Cardiac and Pulmonary Rehab  Date  12/21/16  Educator  [facility]  Instruction Review Code  1- Verbalizes Understanding      Other Matters of the Heart: - Provides group verbal, written materials and models to describe Heart Failure, Angina, Valve Disease, Peripheral Artery Disease, and Diabetes in the realm of heart disease. Includes description of the  disease process and treatment options available to the cardiac patient.   Cardiac Rehab from 02/03/2017 in [facility] Cardiac and Pulmonary Rehab  Date  12/14/16  Educator  [facility]  Instruction Review Code  1- Verbalizes Understanding      Exercise & Equipment Safety: - Individual verbal instruction and demonstration of equipment use and safety with use of the equipment.   Cardiac Rehab from 02/03/2017 in [facility] Cardiac and Pulmonary Rehab  Date  12/07/16  Educator  C. EnterkinRN  Instruction Review Code  3- Needs Reinforcement      Infection Prevention: - Provides verbal and written material to individual with discussion of infection control including proper hand washing and proper equipment cleaning during exercise session.   Cardiac Rehab from 02/03/2017 in [facility] Cardiac and Pulmonary Rehab  Date  12/07/16  Educator  C. EnterkinRN  Instruction Review Code  1- Verbalizes Understanding      Falls Prevention: - Provides verbal and written material to individual with discussion of falls prevention and safety.   Cardiac Rehab from 02/03/2017 in [facility] Cardiac and Pulmonary Rehab  Date  12/07/16  Educator  C. ENterkinRN  Instruction Review Code  1- Verbalizes Understanding      Diabetes: - Individual verbal and written instruction to review signs/symptoms of diabetes, desired ranges of glucose level fasting, after meals and with exercise. Acknowledge that pre and post exercise glucose checks will be done for 3 sessions at entry of program.   Other: -Provides group and verbal instruction on various topics (see comments)   Cardiac Rehab from 02/03/2017 in [facility] Cardiac and Pulmonary Rehab  Date  02/03/17 [Risk Factors and Know Your Numbers]  Educator  [facility]  Instruction Review Code  1- Verbalizes Understanding       Knowledge Questionnaire Score: Knowledge Questionnaire Score - 02/02/17 1516      Knowledge Questionnaire Score   Post Score  27       Core Components/Risk Factors/Patient  Goals at Admission: Personal Goals and Risk Factors at Admission - 12/07/16 1216      Core Components/Risk Factors/Patient Goals on Admission    Weight Management  Yes;Weight Loss    Intervention  Weight Management: Develop a combined nutrition and exercise program designed to reach desired caloric intake, while maintaining appropriate intake of nutrient and fiber, sodium and fats, and appropriate energy expenditure required for the weight goal.;Weight Management: Provide education and appropriate resources to help participant work on and attain dietary goals.    Admit Weight  182 lb 1.6 oz (82.6 kg)    Goal Weight: Short Term  180 lb (81.6 kg)    Goal  Weight: Long Term  172 lb (78 kg)    Expected Outcomes  Short Term: Continue to assess and modify interventions until short term weight is achieved;Long Term: Adherence to nutrition and physical activity/exercise program aimed toward attainment of established weight goal;Weight Loss: Understanding of general recommendations for a balanced deficit meal plan, which promotes 1-2 lb weight loss per week and includes a negative energy balance of [phone] kcal/d;Understanding recommendations for meals to include 15-35% energy as protein, 25-35% energy from fat, 35-60% energy from carbohydrates, less than 200mg  of dietary cholesterol, 20-35 gm of total fiber daily;Understanding of distribution of calorie intake throughout the day with the consumption of 4-5 meals/snacks    Hypertension  Yes    Intervention  Provide education on lifestyle modifcations including regular physical activity/exercise, weight management, moderate sodium restriction and increased consumption of fresh fruit, vegetables, and low fat dairy, alcohol moderation, and smoking cessation.;Monitor prescription use compliance.    Expected Outcomes  Short Term: Continued assessment and intervention until BP is < 140/49mm HG in hypertensive participants. < 130/41mm HG in hypertensive participants  with diabetes, heart failure or chronic kidney disease.;Long Term: Maintenance of blood pressure at goal levels.    Lipids  Yes    Intervention  Provide education and support for participant on nutrition & aerobic/resistive exercise along with prescribed medications to achieve LDL 70mg , HDL >40mg .    Expected Outcomes  Short Term: Participant states understanding of desired cholesterol values and is compliant with medications prescribed. Participant is following exercise prescription and nutrition guidelines.;Long Term: Cholesterol controlled with medications as prescribed, with individualized exercise RX and with personalized nutrition plan. Value goals: LDL < 70mg , HDL > 40 mg.       Core Components/Risk Factors/Patient Goals Review:  Goals and Risk Factor Review    Row Name 12/31/16 1656 01/21/17 1750           Core Components/Risk Factors/Patient Goals Review   Personal Goals Review  Weight Management/Obesity;Hypertension  Weight Management/Obesity;Hypertension      Review  Donald Riley reports some stress due to the anaphylactic reaction to a tick bite.  He is scheduling to meet with the RD.    Blood pressure has been very stable in Cardiac Rehab. wt 185lbs. 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      Expected Outcomes  Short - Donald Riley will meet with RD and get some healthy choices that address all health needs.  Long - Donald Riley will implement suggestions from RD  Dr. Durwin Reges ill meet with the Cardiac REhab REgistered dietician. Healthy eating exlcuding red meat. Stable blood pressure and control his weight.         Core Components/Risk Factors/Patient Goals at Discharge (Final Review):  Goals and Risk Factor Review - 01/21/17 1750      Core Components/Risk Factors/Patient Goals Review   Personal Goals Review  Weight Management/Obesity;Hypertension     Review  Blood pressure has been very stable in Cardiac Rehab. wt 185lbs. 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    Expected Outcomes  Dr. Durwin Reges ill meet with the Cardiac REhab REgistered dietician. Healthy eating exlcuding red meat. Stable blood pressure  and control his weight.       ITP Comments: ITP Comments    Row Name 12/07/16 1223 12/07/16 1227 12/09/16 1144 01/06/17 0827 01/21/17 1156   ITP Comments  ITP created during Medical Review after Cardiac Rehab informed consent was signed.   Smitty Knudsen, M.D. 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   30 day review. Continue with ITP unless directed changes per Medical Director review.    30 day review. Continue with ITP unless directed changes per Medical Director Review.   I called and left a vm to let Donald Riley know that I contacted the Cardiac Rehab Registered Dietician that he can not eat Red Meat due to his [place] Tic Bite to prevent another anaphylactic problem.   [place] Name 02/03/17 0608           ITP Comments  30 day review. Continue with ITP unless directed changes per Medical Director review.           Comments: Discharge ITP

## 2017-03-03 ENCOUNTER — Encounter: Payer: Self-pay | Admitting: *Deleted

## 2017-03-03 DIAGNOSIS — Z955 Presence of coronary angioplasty implant and graft: Secondary | ICD-10-CM

## 2017-03-03 DIAGNOSIS — I213 ST elevation (STEMI) myocardial infarction of unspecified site: Secondary | ICD-10-CM

## 2017-03-03 NOTE — Progress Notes (Signed)
Cardiac Individual Treatment Plan  Patient Details  Name: Donald Riley MRN: [id number] Date of Birth: May 20, 1933 Referring Provider:     Cardiac Rehab from 12/07/2016 in [facility] Cardiac and Pulmonary Rehab  Referring Provider  Bartholome Bill MD      Initial Encounter Date:    Cardiac Rehab from 12/07/2016 in [facility] Cardiac and Pulmonary Rehab  Date  12/07/16  Referring Provider  Bartholome Bill MD      Visit Diagnosis: ST elevation myocardial infarction (STEMI), unspecified artery [facility])  Status post coronary artery stent placement  Patient's Home Medications on Admission:  Current Outpatient Medications:  .  albuterol (VENTOLIN HFA) 108 (90 BASE) MCG/ACT inhaler, Inhale into the lungs every 4 (four) hours as needed. , Disp: , Rfl:  .  amiodarone (PACERONE) 200 MG tablet, , Disp: , Rfl:  .  aspirin 81 MG chewable tablet, Chew 1 tablet (81 mg total) by mouth daily., Disp: 30 tablet, Rfl: 2 .  atorvastatin (LIPITOR) 40 MG tablet, Take 1 tablet (40 mg total) by mouth daily at 6 PM., Disp: 30 tablet, Rfl: 2 .  brimonidine (ALPHAGAN P) 0.1 % SOLN, Alphagan P 0.1 % eye drops, Disp: , Rfl:  .  clopidogrel (PLAVIX) 75 MG tablet, Take 1 tablet (75 mg total) by mouth daily with breakfast., Disp: 30 tablet, Rfl: 2 .  dorzolamide-timolol (COSOPT) 22.3-6.8 MG/ML ophthalmic solution, , Disp: , Rfl:  .  EPINEPHrine 0.3 mg/0.3 mL IJ SOAJ injection, Inject 0.3 mLs (0.3 mg total) into the muscle once as needed., Disp: 6 Device, Rfl: 0 .  ferrous sulfate 325 (65 FE) MG tablet, Take 1 tablet (325 mg total) by mouth daily with breakfast., Disp: 30 tablet, Rfl: 2 .  furosemide (LASIX) 20 MG tablet, Take 1 tablet (20 mg total) by mouth daily., Disp: 30 tablet, Rfl: 2 .  latanoprost (XALATAN) 0.005 % ophthalmic solution, Place 1 drop into both eyes at bedtime. , Disp: , Rfl:  .  metoprolol succinate (TOPROL-XL) 25 MG 24 hr tablet, metoprolol succinate ER 25 mg tablet,extended release 24 hr  Take 1 tablet every day  by oral route., Disp: , Rfl:  .  mometasone-formoterol (DULERA) 200-5 MCG/ACT AERO, Dulera 200 mcg-5 mcg/actuation HFA aerosol inhaler, Disp: , Rfl:  .  MULTIPLE VITAMIN PO, Take by mouth., Disp: , Rfl:  .  oxyCODONE-acetaminophen (PERCOCET/ROXICET) 5-325 MG tablet, Take 1 tablet by mouth every 6 (six) hours as needed for moderate pain or severe pain (pain). (Patient not taking: Reported on 12/07/2016), Disp: 12 tablet, Rfl: 0 .  pantoprazole (PROTONIX) 40 MG tablet, Take 40 mg by mouth daily. Reported on 07/02/2015, Disp: , Rfl:  .  pramipexole (MIRAPEX) 0.25 MG tablet, Take 0.25-0.5 mg by mouth 2 (two) times daily. , Disp: , Rfl:  .  predniSONE (DELTASONE) 20 MG tablet, , Disp: , Rfl:  .  tiotropium (SPIRIVA HANDIHALER) 18 MCG inhalation capsule, Place 18 mcg into inhaler and inhale daily. , Disp: , Rfl:   Past Medical History: Past Medical History:  Diagnosis Date  . Arthritis   . Asthma   . Atrial fibrillation ([place])   . Cancer ([place])   . GERD (gastroesophageal reflux disease)   . Glaucoma   . Gout   . Heart murmur   . Hypertension   . Neuropathy   . Osteoporosis   . Sleep apnea     Tobacco Use: Social History   Tobacco Use  Smoking Status Never Smoker  Smokeless Tobacco Never Used    Labs:  Recent Review Flowsheet Data    Labs for ITP Cardiac and Pulmonary Rehab Latest Ref Rng & Units 10/01/2016 10/01/2016 10/01/2016   Cholestrol 0 - 200 mg/dL - - 201(H)   LDLCALC 0 - 99 mg/dL - - 105(H)   HDL >40 mg/dL - - 82   Trlycerides <150 mg/dL - 87 68   PHART 7.350 - 7.450 7.31(L) - -   PCO2ART 32.0 - 48.0 mmHg 43 - -   HCO3 20.0 - 28.0 mmol/L 21.7 - -   ACIDBASEDEF 0.0 - 2.0 mmol/L 4.6(H) - -   O2SAT % 99.5 - -       Exercise Target Goals:    Exercise Program Goal: Individual exercise prescription set with THRR, safety & activity barriers. Participant demonstrates ability to understand and report RPE using BORG scale, to self-measure pulse accurately, and to acknowledge the  importance of the exercise prescription.  Exercise Prescription Goal: Starting with aerobic activity 30 plus minutes a day, 3 days per week for initial exercise prescription. Provide home exercise prescription and guidelines that participant acknowledges understanding prior to discharge.  Activity Barriers & Risk Stratification:   6 Minute Walk: 6 Minute Walk    Row Name 01/28/17 1629         6 Minute Walk   Phase  Discharge     Distance  765 feet     Distance % Change  36.6 %     Distance Feet Change  205 ft     Walk Time  6 minutes     # of Rest Breaks  0     MPH  1.45     METS  1.11     RPE  10     VO2 Peak  3.86     Symptoms  No     Resting HR  73 bpm     Resting BP  124/64     Exercise Oxygen Saturation  during 6 min walk  95 %     Max Ex. HR  75 bpm     Max Ex. BP  120/70        Oxygen Initial Assessment: Oxygen Initial Assessment - 01/08/17 0928      Home Oxygen   Home Oxygen Device  None    Home Exercise Oxygen Prescription  None    Home at Rest Exercise Oxygen Prescription  None      Initial 6 min Walk   Oxygen Used  None       Oxygen Re-Evaluation: Oxygen Re-Evaluation    Row Name 01/21/17 1750             Program Oxygen Prescription   Program Oxygen Prescription  None         Home Oxygen   Home Oxygen Device  None       Sleep Oxygen Prescription  None       Home Exercise Oxygen Prescription  None       Home at Rest Exercise Oxygen Prescription  None          Oxygen Discharge (Final Oxygen Re-Evaluation): Oxygen Re-Evaluation - 01/21/17 1750      Program Oxygen Prescription   Program Oxygen Prescription  None      Home Oxygen   Home Oxygen Device  None    Sleep Oxygen Prescription  None    Home Exercise Oxygen Prescription  None    Home at Rest Exercise Oxygen Prescription  None       Initial  Exercise Prescription:   Perform Capillary Blood Glucose checks as needed.  Exercise Prescription Changes: Exercise Prescription  Changes    Row Name 01/15/17 1200 01/29/17 0900           Response to Exercise   Blood Pressure (Admit)  122/70  124/64      Blood Pressure (Exercise)  120/84  120/70      Blood Pressure (Exit)  104/74  120/70      Heart Rate (Admit)  83 bpm  80 bpm      Heart Rate (Exercise)  96 bpm  102 bpm      Heart Rate (Exit)  81 bpm  76 bpm      Rating of Perceived Exertion (Exercise)  13  12      Symptoms  none  none      Duration  Continue with 45 min of aerobic exercise without signs/symptoms of physical distress.  Continue with 45 min of aerobic exercise without signs/symptoms of physical distress.      Intensity  THRR unchanged  THRR unchanged        Progression   Progression  Continue to progress workloads to maintain intensity without signs/symptoms of physical distress.  Continue to progress workloads to maintain intensity without signs/symptoms of physical distress.      Average METs  2.6  2.4        Resistance Training   Training Prescription  Yes  Yes      Weight  3 lb.  3 lb      Reps  10-15  10-15        Treadmill   MPH  1  -      Grade  0  -      Minutes  15  -      METs  1.77  -        NuStep   Level  4  4      SPM  80  80      Minutes  15  15      METs  2.9  2.8        Biostep-RELP   Level  1  1      SPM  50  50      Minutes  15  15      METs  3  2        Home Exercise Plan   Plans to continue exercise at  [employer] (comment)  Forensic scientist (comment)      Frequency  Add 2 additional days to program exercise sessions.  Add 2 additional days to program exercise sessions.      Initial Home Exercises Provided  12/31/16  12/31/16         Exercise Comments: Exercise Comments    Row Name 01/21/17 1755           Exercise Comments  . Sam said he will exercise with his wife in [place] since she goes there 2 days a week. Sam said Cardiac Rehab has helped him and he has enjoyed it.          Exercise Goals and Review:   Exercise Goals  Re-Evaluation : Exercise Goals Re-Evaluation    Row Name 01/15/17 1236 01/28/17 1633 01/29/17 0933         Exercise Goal Re-Evaluation   Exercise Goals Review  Increase Physical Activity;Increase Strength and Stamina;Able to understand and use rate of perceived exertion (RPE) scale  Increase  Physical Activity;Increase Strength and Stamina  Increase Physical Activity;Increase Strength and Stamina     Comments  Sam is progressing well and is up to Level 4 on NS.  Staff will continue to monitor progression.  6 min walk test done today. Results reviewed with patinet. See detailed report in 6 min walk data section. Patient increased walk distance by 205 feet.   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     Expected Outcomes  Short - Sam will continue to attend regularly.  Long - Sam will maintain fitness gains.  Short: Sam will graduate from cardiac rehab. Long: Sam will maintain fitness gains by exercising independently and safely at a community fitness center upon graduation.   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         Discharge Exercise Prescription (Final Exercise Prescription Changes): Exercise Prescription Changes - 01/29/17 0900      Response to Exercise   Blood Pressure (Admit)  124/64    Blood Pressure (Exercise)  120/70    Blood Pressure (Exit)  120/70    Heart Rate (Admit)  80 bpm    Heart Rate (Exercise)  102 bpm    Heart Rate (Exit)  76 bpm    Rating of Perceived Exertion (Exercise)  12    Symptoms  none    Duration  Continue with 45 min of aerobic exercise without signs/symptoms of physical distress.    Intensity  THRR unchanged      Progression   Progression  Continue to progress workloads to maintain intensity without signs/symptoms of physical distress.    Average METs  2.4      Resistance Training   Training Prescription  Yes    Weight  3 lb    Reps  10-15      NuStep    Level  4    SPM  80    Minutes  15    METs  2.8      Biostep-RELP   Level  1    SPM  50    Minutes  15    METs  2      Home Exercise Plan   Plans to continue exercise at  [facility] (comment)    Frequency  Add 2 additional days to program exercise sessions.    Initial Home Exercises Provided  12/31/16       Nutrition:  Target Goals: Understanding of nutrition guidelines, daily intake of sodium 1500mg , cholesterol 200mg , calories 30% from fat and 7% or less from saturated fats, daily to have 5 or more servings of fruits and vegetables.  Biometrics:  Post Biometrics - 01/28/17 1631       Post  Biometrics   Height  5' 8.9" (1.75 m)    Weight  184 lb 11.2 oz (83.8 kg)    Waist Circumference  41 inches    Hip Circumference  41 inches    Waist to Hip Ratio  1 %    BMI (Calculated)  27.36    Single Leg Stand  2.11 seconds       Nutrition Therapy Plan and Nutrition Goals: Nutrition Therapy & Goals - 01/27/17 1606      Nutrition Therapy   Diet  TLC    Protein (specify units)  8oz    Fruits and Vegetables  5 servings/day    Sodium  1500 grams  Personal Nutrition Goals   Personal Goal #2  Eat plenty of vegetables and fruits with meals and snacks    Personal Goal #3  Try Minute Rice Multi Grain Medley for a healthy whole grain rice that is low in sodium    Comments  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 He is looking for protein alternatives to red meat, discussed options.       Intervention Plan   Intervention  Prescribe, educate and counsel regarding individualized specific dietary modifications aiming towards targeted core components such as weight, hypertension, lipid management, diabetes, heart failure and other comorbidities.;Nutrition handout(s) given to patient.    Expected Outcomes  Short Term Goal: Understand basic principles of dietary content, such as calories, fat, sodium,  cholesterol and nutrients.;Short Term Goal: A plan has been developed with personal nutrition goals set during dietitian appointment.;Long Term Goal: Adherence to prescribed nutrition plan.       Nutrition Discharge: Rate Your Plate Scores: Nutrition Assessments - 02/02/17 1517      MEDFICTS Scores   Pre Score  98    Post Score  74    Score Difference  -24       Nutrition Goals Re-Evaluation: Nutrition Goals Re-Evaluation    Row Name 01/21/17 1755             Goals   Current Weight  185 lb (83.9 kg)       Nutrition Goal  Healthy eating excluding red meat since he had an anaphylactic reaction 6 hours after he ate a hamburger. Sam said he takes ticks off him all the time and has had a Lonestar Tic on him in the past.        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          Nutrition Goals Discharge (Final Nutrition Goals Re-Evaluation): Nutrition Goals Re-Evaluation - 01/21/17 1755      Goals   Current Weight  185 lb (83.9 kg)    Nutrition Goal  Healthy eating excluding red meat since he had an anaphylactic reaction 6 hours after he ate a hamburger. Sam said he takes ticks off him all the time and has had a Lonestar Tic on him in the past.     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       Psychosocial: Target Goals: Acknowledge presence or absence of significant depression and/or stress, maximize coping skills, provide positive support system. Participant is able to verbalize types and ability to use techniques and skills needed for reducing stress and depression.   Initial Review & Psychosocial Screening:   Quality of Life Scores:  Quality of Life - 02/02/17 1512      Quality of Life Scores   Health/Function Post  14.43 %    Socioeconomic Post  27.92 %     Psych/Spiritual Post  18.43 %    Family Post  27.6 %    GLOBAL Post  19.73 %       PHQ-9: Recent Review Flowsheet Data    Depression screen [facility] 2/9 12/07/2016   Decreased Interest 1   Down,  Depressed, Hopeless 1   PHQ - 2 Score 2   Altered sleeping 0   Tired, decreased energy 3   Change in appetite 0   Feeling bad or failure about yourself  0   Trouble concentrating 2   Moving slowly or fidgety/restless 0   Suicidal thoughts 0   PHQ-9 Score 7   Difficult doing work/chores Not difficult at all     Interpretation of Total Score  Total Score Depression Severity:  1-4 = Minimal depression, 5-9 = Mild depression, 10-14 = Moderate depression, 15-19 = Moderately severe depression, 20-27 = Severe depression   Psychosocial Evaluation and Intervention: Psychosocial Evaluation - 02/01/17 1705      Discharge Psychosocial Assessment & Intervention   Comments  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  He reports experiencing progress in his legs being stronger and just feeling better/stronger overall.  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  His PHQ-9 scores increased from a "7" to an "11" indicating increased symptoms of depression.  When questioned, he replied that he has been having a problem with the agency that provides his CPAP for over a month and has been having sleep difficulties longer than that as a result.  A BiPap has been recommended instead but his Dr. delayed in approving that order.  He has an appointment about this in (2) days (Wednesday - 11/7) and hopes his sleep will improve.  This is impacting his ability to function overall.  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  Counselor recommended a therapist who provides treatment for PTSD and gave Dr. Nicki Reaper a card.  He agreed to contact them and was glad the recommendation did not involve "more medication."  He stated the lack of  sleep is impacting his mood and he would like to address this.  Counselor commended Dr. Nicki Reaper for his progress made while in this program.   [facility] completion of this program.         Psychosocial Re-Evaluation: Psychosocial Re-Evaluation    [place] Name 01/21/17 1753             Psychosocial Re-Evaluation   Comments  Sam said he knows they need to down size but he and his wife are not at that point yet. Sam said he will exercise with his wife in [place] since she goes there 2 days a week. Sam said Cardiac Rehab has helped him and he has enjoyed it.           Psychosocial Discharge (Final Psychosocial Re-Evaluation): Psychosocial Re-Evaluation - 01/21/17 1753      Psychosocial Re-Evaluation   Comments  Sam said he knows they need to down size but he and his wife are not at that point yet. Sam said he will exercise with his wife in [place] since she goes there 2 days a week. Sam said Cardiac Rehab has helped him and he has enjoyed it.        Vocational Rehabilitation: Provide vocational rehab assistance to qualifying candidates.   Vocational Rehab Evaluation & Intervention:   Education: Education Goals: Education classes will be provided on a variety of topics geared toward better understanding of heart health and risk factor modification. Participant will state understanding/return demonstration of topics presented as noted by education test scores.  Learning Barriers/Preferences:   Education Topics: General Nutrition Guidelines/Fats and  Fiber: -Group instruction provided by verbal, written material, models and posters to present the general guidelines for heart healthy nutrition. Gives an explanation and review of dietary fats and fiber.   Cardiac Rehab from 02/03/2017 in [facility] Cardiac and Pulmonary Rehab  Date  01/11/17  Educator  PI  Instruction Review Code  1- Verbalizes Understanding      Controlling Sodium/Reading  Food Labels: -Group verbal and written material supporting the discussion of sodium use in heart healthy nutrition. Review and explanation with models, verbal and written materials for utilization of the food label.   Cardiac Rehab from 02/03/2017 in [facility] Cardiac and Pulmonary Rehab  Date  01/18/17  Educator  PI  Instruction Review Code  1- Verbalizes Understanding      Exercise Physiology & Risk Factors: - Group verbal and written instruction with models to review the exercise physiology of the cardiovascular system and associated critical values. Details cardiovascular disease risk factors and the goals associated with each risk factor.   Cardiac Rehab from 02/03/2017 in [facility] Cardiac and Pulmonary Rehab  Date  01/25/17  Educator  [facility]  Instruction Review Code  1- Verbalizes Understanding      Aerobic Exercise & Resistance Training: - Gives group verbal and written discussion on the health impact of inactivity. On the components of aerobic and resistive training programs and the benefits of this training and how to safely progress through these programs.   Cardiac Rehab from 02/03/2017 in [facility] Cardiac and Pulmonary Rehab  Date  01/27/17  Educator  AS  Instruction Review Code  1- Verbalizes Understanding      Flexibility, Balance, General Exercise Guidelines: - Provides group verbal and written instruction on the benefits of flexibility and balance training programs. Provides general exercise guidelines with specific guidelines to those with heart or lung disease. Demonstration and skill practice provided.   Cardiac Rehab from 02/03/2017 in [facility] Cardiac and Pulmonary Rehab  Date  02/01/17  Educator  [facility]  Instruction Review Code  1- Verbalizes Understanding      Stress Management: - Provides group verbal and written instruction about the health risks of elevated stress, cause of high stress, and healthy ways to reduce stress.   Depression: - Provides group verbal and written  instruction on the correlation between heart/lung disease and depressed mood, treatment options, and the stigmas associated with seeking treatment.   Cardiac Rehab from 02/03/2017 in [facility] Cardiac and Pulmonary Rehab  Date  01/13/17  Educator  [facility]  Instruction Review Code  1- Verbalizes Understanding      Anatomy & Physiology of the Heart: - Group verbal and written instruction and models provide basic cardiac anatomy and physiology, with the coronary electrical and arterial systems. Review of: AMI, Angina, Valve disease, Heart Failure, Cardiac Arrhythmia, Pacemakers, and the ICD.   Cardiac Rehab from 02/03/2017 in [facility] Cardiac and Pulmonary Rehab  Date  12/14/16  Educator  [facility]  Instruction Review Code  1- Verbalizes Understanding      Cardiac Procedures: - Group verbal and written instruction to review commonly prescribed medications for heart disease. Reviews the medication, class of the drug, and side effects. Includes the steps to properly store meds and maintain the prescription regimen. (beta blockers and nitrates)   Cardiac Rehab from 02/03/2017 in [facility] Cardiac and Pulmonary Rehab  Date  12/21/16  Educator  [facility]  Instruction Review Code  1- Verbalizes Understanding      Cardiac Medications I: - Group verbal and written instruction to review commonly prescribed  medications for heart disease. Reviews the medication, class of the drug, and side effects. Includes the steps to properly store meds and maintain the prescription regimen.   Cardiac Rehab from 02/03/2017 in [facility] Cardiac and Pulmonary Rehab  Date  12/28/16  Educator  MA  Instruction Review Code  1- Verbalizes Understanding      Cardiac Medications II: -Group verbal and written instruction to review commonly prescribed medications for heart disease. Reviews the medication, class of the drug, and side effects. (all other drug classes)    Go Sex-Intimacy & Heart Disease, Get SMART - Goal Setting: - Group verbal and written  instruction through game format to discuss heart disease and the return to sexual intimacy. Provides group verbal and written material to discuss and apply goal setting through the application of the S.M.A.R.T. Method.   Cardiac Rehab from 02/03/2017 in [facility] Cardiac and Pulmonary Rehab  Date  12/21/16  Educator  [facility]  Instruction Review Code  1- Verbalizes Understanding      Other Matters of the Heart: - Provides group verbal, written materials and models to describe Heart Failure, Angina, Valve Disease, Peripheral Artery Disease, and Diabetes in the realm of heart disease. Includes description of the disease process and treatment options available to the cardiac patient.   Cardiac Rehab from 02/03/2017 in [facility] Cardiac and Pulmonary Rehab  Date  12/14/16  Educator  [facility]  Instruction Review Code  1- Verbalizes Understanding      Exercise & Equipment Safety: - Individual verbal instruction and demonstration of equipment use and safety with use of the equipment.   Cardiac Rehab from 02/03/2017 in [facility] Cardiac and Pulmonary Rehab  Date  12/07/16  Educator  C. EnterkinRN  Instruction Review Code  3- Needs Reinforcement      Infection Prevention: - Provides verbal and written material to individual with discussion of infection control including proper hand washing and proper equipment cleaning during exercise session.   Cardiac Rehab from 02/03/2017 in [facility] Cardiac and Pulmonary Rehab  Date  12/07/16  Educator  C. EnterkinRN  Instruction Review Code  1- Verbalizes Understanding      Falls Prevention: - Provides verbal and written material to individual with discussion of falls prevention and safety.   Cardiac Rehab from 02/03/2017 in [facility] Cardiac and Pulmonary Rehab  Date  12/07/16  Educator  C. ENterkinRN  Instruction Review Code  1- Verbalizes Understanding      Diabetes: - Individual verbal and written instruction to review signs/symptoms of diabetes, desired ranges of glucose level  fasting, after meals and with exercise. Acknowledge that pre and post exercise glucose checks will be done for 3 sessions at entry of program.   Other: -Provides group and verbal instruction on various topics (see comments)   Cardiac Rehab from 02/03/2017 in [facility] Cardiac and Pulmonary Rehab  Date  02/03/17 [Risk Factors and Know Your Numbers]  Educator  [facility]  Instruction Review Code  1- Verbalizes Understanding       Knowledge Questionnaire Score: Knowledge Questionnaire Score - 02/02/17 1516      Knowledge Questionnaire Score   Post Score  27       Core Components/Risk Factors/Patient Goals at Admission:   Core Components/Risk Factors/Patient Goals Review:  Goals and Risk Factor Review    Row Name 01/21/17 1750             Core Components/Risk Factors/Patient Goals Review   Personal Goals Review  Weight Management/Obesity;Hypertension       Review  Blood pressure has been very stable in Cardiac Rehab. wt 185lbs. 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       Expected Outcomes  Dr. Durwin Reges ill meet with the Cardiac REhab REgistered dietician. Healthy eating exlcuding red meat. Stable blood pressure and control his weight.          Core Components/Risk Factors/Patient Goals at Discharge (Final Review):  Goals and Risk Factor Review - 01/21/17 1750      Core Components/Risk Factors/Patient Goals Review   Personal Goals Review  Weight Management/Obesity;Hypertension    Review  Blood pressure has been very stable in Cardiac Rehab. wt 185lbs. 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    Expected Outcomes  Dr. Durwin Reges ill meet with the Cardiac REhab REgistered dietician.  Healthy eating exlcuding red meat. Stable blood pressure and control his weight.       ITP Comments: ITP Comments    Row Name 01/06/17 0827 01/21/17 1156 02/03/17 0608 03/03/17 0640     ITP Comments  30 day review. Continue with ITP unless directed changes per Medical Director Review.   I called and left a vm to let Dr. 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  30 day review. Continue with ITP unless directed changes per Medical Director review.   discharged       Comments:

## 2017-04-09 ENCOUNTER — Other Ambulatory Visit: Payer: Self-pay | Admitting: Ophthalmology

## 2017-04-09 DIAGNOSIS — G453 Amaurosis fugax: Secondary | ICD-10-CM

## 2017-04-16 ENCOUNTER — Ambulatory Visit
Admission: RE | Admit: 2017-04-16 | Discharge: 2017-04-16 | Disposition: A | Discharge status: Home / Self Care | Payer: Medicare Other | Source: Ambulatory Visit | Attending: Ophthalmology | Admitting: Ophthalmology

## 2017-04-16 DIAGNOSIS — G453 Amaurosis fugax: Secondary | ICD-10-CM | POA: Insufficient documentation

## 2017-04-16 DIAGNOSIS — I6523 Occlusion and stenosis of bilateral carotid arteries: Secondary | ICD-10-CM | POA: Insufficient documentation

## 2017-08-14 ENCOUNTER — Ambulatory Visit
Admission: EM | Admit: 2017-08-14 | Discharge: 2017-08-14 | Disposition: A | Discharge status: Home / Self Care | Payer: Medicare Other | Attending: Family Medicine | Admitting: Family Medicine

## 2017-08-14 ENCOUNTER — Other Ambulatory Visit: Payer: Self-pay

## 2017-08-14 ENCOUNTER — Ambulatory Visit: Payer: Medicare Other

## 2017-08-14 DIAGNOSIS — Z79899 Other long term (current) drug therapy: Secondary | ICD-10-CM | POA: Insufficient documentation

## 2017-08-14 DIAGNOSIS — M79671 Pain in right foot: Secondary | ICD-10-CM

## 2017-08-14 DIAGNOSIS — Z888 Allergy status to other drugs, medicaments and biological substances status: Secondary | ICD-10-CM | POA: Insufficient documentation

## 2017-08-14 DIAGNOSIS — S92354A Nondisplaced fracture of fifth metatarsal bone, right foot, initial encounter for closed fracture: Secondary | ICD-10-CM | POA: Insufficient documentation

## 2017-08-14 DIAGNOSIS — S99921A Unspecified injury of right foot, initial encounter: Secondary | ICD-10-CM | POA: Diagnosis present

## 2017-08-14 DIAGNOSIS — Z7902 Long term (current) use of antithrombotics/antiplatelets: Secondary | ICD-10-CM | POA: Diagnosis not present

## 2017-08-14 DIAGNOSIS — Z7982 Long term (current) use of aspirin: Secondary | ICD-10-CM | POA: Insufficient documentation

## 2017-08-14 DIAGNOSIS — W19XXXA Unspecified fall, initial encounter: Secondary | ICD-10-CM | POA: Insufficient documentation

## 2017-08-14 NOTE — Discharge Instructions (Signed)
Call [place] or [facility] next week.  Pain medication as needed.  Take care  Dr. Lacinda Axon

## 2017-08-14 NOTE — ED Provider Notes (Signed)
[facility]    CSN: [id number] Arrival date & time: 08/14/17  4132  History   Chief Complaint Chief Complaint  Patient presents with  . Foot Injury   HPI  84 year old male presents with a foot injury.  Patient was working on his tractor.  He put gas in the tank and subsequently went to start the engine.  When he started the engine, the tractor was in gear and started to roll forward.  He subsequently fell backwards and injured his right foot.  Pain is moderate currently but is severe with activity and bearing weight.  Pain is localized primarily to the base of the fifth metatarsal.  Mild swelling.  No known relieving factors.  No other associated symptoms.  No other complaints.   Past Medical History:  Diagnosis Date  . Arthritis   . Asthma   . Atrial fibrillation ([place])   . Cancer ([place])   . GERD (gastroesophageal reflux disease)   . Glaucoma   . Gout   . Heart murmur   . Hypertension   . Neuropathy   . Osteoporosis   . Sleep apnea    Patient Active Problem List   Diagnosis Date Noted  . Acute ST elevation myocardial infarction (STEMI) of anterolateral wall ([place])   . Angio-edema   . Nonsustained ventricular tachycardia ([place])   . Acute respiratory failure ([place])   . Airway obstruction   . Macroglossia   . Non-STEMI (non-ST elevated myocardial infarction) ([place])   . Anaphylaxis 09/30/2016   Past Surgical History:  Procedure Laterality Date  . ARTHROPLASTY Right    ankle  . BUNIONECTOMY    . CARDIAC CATHETERIZATION N/A 07/02/2015   Procedure: Right/Left Heart Cath and Coronary Angiography;  Surgeon: Teodoro Spray, MD;  Location: [facility];  Service: Cardiovascular;  Laterality: N/A;  . CORONARY/GRAFT ACUTE MI REVASCULARIZATION N/A 10/02/2016   Procedure: Coronary/Graft Acute MI Revascularization;  Surgeon: Wellington Hampshire, MD;  Location: [facility];  Service: Cardiovascular;  Laterality: N/A;  . JOINT REPLACEMENT    . ostatectomy      Home Medications    Prior to Admission medications   Medication Sig Start Date End Date Taking? Authorizing Provider  albuterol (VENTOLIN HFA) 108 (90 BASE) MCG/ACT inhaler Inhale into the lungs every 4 (four) hours as needed.  01/30/14   [provider]  amiodarone (PACERONE) 200 MG tablet  10/06/16   [provider]  aspirin 81 MG chewable tablet Chew 1 tablet (81 mg total) by mouth daily. 10/06/16   Demetrios Loll, MD  atorvastatin (LIPITOR) 40 MG tablet Take 1 tablet (40 mg total) by mouth daily at 6 PM. 10/06/16   Demetrios Loll, MD  brimonidine (ALPHAGAN P) 0.1 % SOLN Alphagan P 0.1 % eye drops    [provider]  clopidogrel (PLAVIX) 75 MG tablet Take 1 tablet (75 mg total) by mouth daily with breakfast. 10/07/16   Demetrios Loll, MD  dorzolamide-timolol (COSOPT) 22.3-6.8 MG/ML ophthalmic solution  08/20/16   [provider]  EPINEPHrine 0.3 mg/0.3 mL IJ SOAJ injection Inject 0.3 mLs (0.3 mg total) into the muscle once as needed. 10/06/16   Demetrios Loll, MD  ferrous sulfate 325 (65 FE) MG tablet Take 1 tablet (325 mg total) by mouth daily with breakfast. 10/06/16   Demetrios Loll, MD  furosemide (LASIX) 20 MG tablet Take 1 tablet (20 mg total) by mouth daily. 10/06/16   Demetrios Loll, MD  latanoprost (XALATAN) 0.005 % ophthalmic solution  Place 1 drop into both eyes at bedtime.  01/30/13   [provider]  metoprolol succinate (TOPROL-XL) 25 MG 24 hr tablet metoprolol succinate ER 25 mg tablet,extended release 24 hr  Take 1 tablet every day by oral route.    [provider]  mometasone-formoterol (DULERA) 200-5 MCG/ACT AERO Dulera 200 mcg-5 mcg/actuation HFA aerosol inhaler    [provider]  MULTIPLE VITAMIN PO Take by mouth.    [provider]  pantoprazole (PROTONIX) 40 MG tablet Take 40 mg by mouth daily. Reported on 07/02/2015 05/19/12   [provider]  pramipexole (MIRAPEX) 0.25 MG tablet Take 0.25-0.5 mg by mouth 2 (two) times  daily.     [provider]  tiotropium (SPIRIVA HANDIHALER) 18 MCG inhalation capsule Place 18 mcg into inhaler and inhale daily.  01/30/14   [provider]    Family History Alcohol abuse Brother Kennth Vanbenschoten Reformed  Heart disease Brother    High blood pressure (Hypertension) Brother    Osteoporosis (Thinning of bones) Brother    Skin cancer Brother    Osteoporosis (Thinning of bones) Brother    Skin cancer Brother    Heart disease Brother Ludwig   High blood pressure (Hypertension) Brother Ludwig   Osteoarthritis Brother Ludwig   No Known Problems Daughter    Heart disease Father Tyrone Nine atrial fib  Seizures Father Tyrone Nine   Stroke Father Tyrone Nine embolic from a fib  Coronary Artery Disease (Blocked arteries around heart) Maternal Aunt    Stroke Maternal Aunt    Stroke Maternal Aunt    Coronary Artery Disease (Blocked arteries around heart) Maternal Uncle    Stroke Maternal Uncle    Stroke Maternal Uncle    Gout Mother Joaquim Lai Frayne   Heart disease Mother Joaquim Lai Andreoli   High blood pressure (Hypertension) Mother Joaquim Lai Casten   Osteoporosis (Thinning of bones) Mother Joaquim Lai Aloia   Stroke Mother Joaquim Lai Kier   Stroke Paternal Grandfather    Alcohol abuse Paternal Uncle    Diabetes Sister    Glaucoma Sister    High blood pressure (Hypertension) Sister    High blood pressure (Hypertension) Sister    High blood pressure (Hypertension) Sister Claudius Sis   No Known Problems Son    Anesthesia problems Neg Hx      Social History Social History   Tobacco Use  . Smoking status: Never Smoker  . Smokeless tobacco: Never Used  Substance Use Topics  . Alcohol use: Yes    Alcohol/week: 0.0 oz    Comment: rare  . Drug use: No     Allergies   Galactose; Gabapentin; and Celecoxib   Review of Systems Review of Systems  Constitutional: Negative.   Musculoskeletal:       Right foot pain, swelling.    Physical Exam Triage Vital Signs ED Triage Vitals  Enc Vitals Group     BP 08/14/17 1006 121/78     Pulse Rate 08/14/17 1006 67     Resp 08/14/17 1006 20     Temp 08/14/17 1006 98.5 F (36.9 C)     Temp Source 08/14/17 1006 Oral     SpO2 08/14/17 1006 93 %     Weight 08/14/17 1008 183 lb (83 kg)     Height 08/14/17 1008 5\' 9"  (1.753 m)     Head Circumference --      Peak Flow --      Pain Score 08/14/17 1008 4     Pain Loc --  Pain Edu? --      Excl. in [place]? --    Updated Vital Signs BP 121/78 (BP Location: Left Arm)   Pulse 67   Temp 98.5 F (36.9 C) (Oral)   Resp 20   Ht 5\' 9"  (1.753 m)   Wt 183 lb (83 kg)   SpO2 93%   BMI 27.02 kg/m     Physical Exam  Constitutional: He is oriented to person, place, and time. He appears well-developed. No distress.  Cardiovascular: Normal rate and regular rhythm.  Murmur heard. Pulmonary/Chest: Effort normal and breath sounds normal. He has no wheezes. He has no rales.  Musculoskeletal:  Right foot with swelling and exquisite tenderness of the base of the fifth metatarsal.  Neurological: He is alert and oriented to person, place, and time.  Psychiatric: He has a normal mood and affect. His behavior is normal.  Nursing note and vitals reviewed.  UC Treatments / Results  Labs (all labs ordered are listed, but only abnormal results are displayed) Labs Reviewed - No data to display  EKG None  Radiology Dg Foot Complete Right  Result Date: 08/14/2017 CLINICAL DATA:  Fall yesterday with foot pain.  Initial encounter. EXAM: RIGHT FOOT COMPLETE - 3+ VIEW COMPARISON:  None. FINDINGS: Nondisplaced avulsion fracture from the base of the fifth metatarsal. Ankle arthroplasty and distal fibular screws. Osteopenia and arterial calcification. IMPRESSION: 1. Nondisplaced avulsion fracture at the fifth metatarsal base. 2. Osteopenia and ankle arthroplasty. Electronically Signed   By: Monte Fantasia M.D.   On: 08/14/2017 10:36     Procedures Procedures (including critical care time)  Medications Ordered in UC Medications - No data to display  Initial Impression / Assessment and Plan / UC Course  I have reviewed the triage vital signs and the nursing notes.  Pertinent labs & imaging results that were available during my care of the patient were reviewed by me and considered in my medical decision making (see chart for details).    84 year old male presents with an avulsion fracture of the fifth metatarsal base.  Placed in a cam walker.  Patient has pain medication at home.  Advised to see orthopedics next week.  Final Clinical Impressions(s) / UC Diagnoses   Final diagnoses:  Closed nondisplaced fracture of fifth metatarsal bone of right foot, initial encounter     Discharge Instructions     Call [facility] or [facility] next week.  Pain medication as needed.  Take care  Dr. Lacinda Axon     ED Prescriptions    None     Controlled Substance Prescriptions [place] Controlled Substance Registry consulted? Not Applicable   Coral Spikes, DO 08/14/17 1106

## 2017-08-14 NOTE — ED Triage Notes (Signed)
Pt was putting gasoline in his tractor yesterday when it rolled and knocked him off balance and he fell onto the grass. Right foot pain and difficulty bearing weight. Pain 4/10 and increases to 8/10 with pressure.

## 2017-10-07 ENCOUNTER — Other Ambulatory Visit: Payer: Self-pay

## 2017-10-07 ENCOUNTER — Ambulatory Visit
Admission: EM | Admit: 2017-10-07 | Discharge: 2017-10-07 | Disposition: A | Discharge status: Home / Self Care | Payer: Medicare Other | Attending: Family Medicine | Admitting: Family Medicine

## 2017-10-07 DIAGNOSIS — J45909 Unspecified asthma, uncomplicated: Secondary | ICD-10-CM | POA: Insufficient documentation

## 2017-10-07 DIAGNOSIS — M109 Gout, unspecified: Secondary | ICD-10-CM | POA: Insufficient documentation

## 2017-10-07 DIAGNOSIS — R9431 Abnormal electrocardiogram [ECG] [EKG]: Secondary | ICD-10-CM | POA: Diagnosis not present

## 2017-10-07 DIAGNOSIS — R0789 Other chest pain: Secondary | ICD-10-CM | POA: Diagnosis not present

## 2017-10-07 DIAGNOSIS — H409 Unspecified glaucoma: Secondary | ICD-10-CM | POA: Insufficient documentation

## 2017-10-07 DIAGNOSIS — I252 Old myocardial infarction: Secondary | ICD-10-CM | POA: Insufficient documentation

## 2017-10-07 DIAGNOSIS — I10 Essential (primary) hypertension: Secondary | ICD-10-CM | POA: Diagnosis not present

## 2017-10-07 DIAGNOSIS — Z7982 Long term (current) use of aspirin: Secondary | ICD-10-CM | POA: Diagnosis not present

## 2017-10-07 DIAGNOSIS — Z79899 Other long term (current) drug therapy: Secondary | ICD-10-CM | POA: Insufficient documentation

## 2017-10-07 DIAGNOSIS — G4730 Sleep apnea, unspecified: Secondary | ICD-10-CM | POA: Insufficient documentation

## 2017-10-07 DIAGNOSIS — R079 Chest pain, unspecified: Secondary | ICD-10-CM

## 2017-10-07 DIAGNOSIS — I4891 Unspecified atrial fibrillation: Secondary | ICD-10-CM | POA: Insufficient documentation

## 2017-10-07 DIAGNOSIS — G629 Polyneuropathy, unspecified: Secondary | ICD-10-CM | POA: Diagnosis not present

## 2017-10-07 DIAGNOSIS — I472 Ventricular tachycardia: Secondary | ICD-10-CM | POA: Diagnosis not present

## 2017-10-07 DIAGNOSIS — K219 Gastro-esophageal reflux disease without esophagitis: Secondary | ICD-10-CM | POA: Insufficient documentation

## 2017-10-07 LAB — COMPREHENSIVE METABOLIC PANEL
ALT: 31 U/L (ref 0–44)
AST: 37 U/L (ref 15–41)
Albumin: 3.6 g/dL (ref 3.5–5.0)
Alkaline Phosphatase: 91 U/L (ref 38–126)
Anion gap: 11 (ref 5–15)
BUN: 13 mg/dL (ref 8–23)
CO2: 24 mmol/L (ref 22–32)
Calcium: 8.6 mg/dL — ABNORMAL LOW (ref 8.9–10.3)
Chloride: 102 mmol/L (ref 98–111)
Creatinine, Ser: 0.89 mg/dL (ref 0.61–1.24)
GFR calc Af Amer: >60 mL/min (ref >60)
GFR calc non Af Amer: >60 mL/min (ref >60)
Glucose, Bld: 168 mg/dL — ABNORMAL HIGH (ref 70–99)
Potassium: 4.3 mmol/L (ref 3.5–5.1)
Sodium: 137 mmol/L (ref 135–145)
Total Bilirubin: 1.1 mg/dL (ref 0.3–1.2)
Total Protein: 6.7 g/dL (ref 6.5–8.1)

## 2017-10-07 LAB — CBC
HCT: 46.8 % (ref 40.0–52.0)
Hemoglobin: 15.6 g/dL (ref 13.0–18.0)
MCH: 33.5 pg (ref 26.0–34.0)
MCHC: 33.4 g/dL (ref 32.0–36.0)
MCV: 100.3 fL — ABNORMAL HIGH (ref 80.0–100.0)
Platelets: 127 10*3/uL — ABNORMAL LOW (ref 150–440)
RBC: 4.66 MIL/uL (ref 4.40–5.90)
RDW: 14.6 % — ABNORMAL HIGH (ref 11.5–14.5)
WBC: 10.5 10*3/uL (ref 3.8–10.6)

## 2017-10-07 LAB — TROPONIN I: Troponin I: <0.03 ng/mL (ref <0.03)

## 2017-10-07 MED ORDER — BACLOFEN 10 MG PO TABS
5.0000 mg | ORAL_TABLET | Freq: Three times a day (TID) | ORAL | 0 refills | Status: DC | PRN
Start: 2017-10-07 — End: 2018-11-27

## 2017-10-07 MED ORDER — TRAMADOL HCL 50 MG PO TABS: 50.0000 mg | ORAL_TABLET | Freq: Three times a day (TID) | ORAL | 0 refills | Status: DC | PRN

## 2017-10-07 NOTE — Discharge Instructions (Signed)
Rest  Medication as prescribed.  Take care  Dr. Skipper Dacosta  

## 2017-10-07 NOTE — ED Provider Notes (Signed)
[facility]    CSN: [id number] Arrival date & time: 10/07/17  7371  History   Chief Complaint Chief Complaint  Patient presents with  . Chest Pain   HPI  84 year old male with history of coronary disease and MI presents with right-sided chest pain. Patient reports that developed right-sided chest pain started yesterday.  Pain is located in the right lower ribs.  Started after he lifted 5 gallon containers of fuel.  Patient states that his pain was severe last night.  Currently 3/10 in severity.  Worse with certain ranges of motion and deep breathing.  Patient states that this does not appear to be consistent with his prior cardiac chest pain.  No shortness of breath.  Better with rest.  No other associated symptoms.  No other complaints or concerns at this time.  Past Medical History:  Diagnosis Date  . Arthritis   . Asthma   . Atrial fibrillation ([place])   . Cancer ([place])   . GERD (gastroesophageal reflux disease)   . Glaucoma   . Gout   . Heart murmur   . Hypertension   . Neuropathy   . Osteoporosis   . Sleep apnea     Patient Active Problem List   Diagnosis Date Noted  . Acute ST elevation myocardial infarction (STEMI) of anterolateral wall ([place])   . Angio-edema   . Nonsustained ventricular tachycardia ([place])   . Acute respiratory failure ([place])   . Airway obstruction   . Macroglossia   . Non-STEMI (non-ST elevated myocardial infarction) ([place])   . Anaphylaxis 09/30/2016    Past Surgical History:  Procedure Laterality Date  . ARTHROPLASTY Right    ankle  . BUNIONECTOMY    . CARDIAC CATHETERIZATION N/A 07/02/2015   Procedure: Right/Left Heart Cath and Coronary Angiography;  Surgeon: Teodoro Spray, MD;  Location: [facility];  Service: Cardiovascular;  Laterality: N/A;  . CORONARY/GRAFT ACUTE MI REVASCULARIZATION N/A 10/02/2016   Procedure: Coronary/Graft Acute MI Revascularization;  Surgeon: Wellington Hampshire, MD;  Location: [facility];   Service: Cardiovascular;  Laterality: N/A;  . JOINT REPLACEMENT    . ostatectomy         Home Medications    Prior to Admission medications   Medication Sig Start Date End Date Taking? Authorizing Provider  albuterol (VENTOLIN HFA) 108 (90 BASE) MCG/ACT inhaler Inhale into the lungs every 4 (four) hours as needed.  01/30/14  Yes [provider]  amiodarone (PACERONE) 200 MG tablet  10/06/16  Yes [provider]  aspirin 81 MG chewable tablet Chew 1 tablet (81 mg total) by mouth daily. 10/06/16  Yes Demetrios Loll, MD  atorvastatin (LIPITOR) 40 MG tablet Take 1 tablet (40 mg total) by mouth daily at 6 PM. 10/06/16  Yes Demetrios Loll, MD  brimonidine (ALPHAGAN P) 0.1 % SOLN Alphagan P 0.1 % eye drops   Yes [provider]  clopidogrel (PLAVIX) 75 MG tablet Take 1 tablet (75 mg total) by mouth daily with breakfast. 10/07/16  Yes Demetrios Loll, MD  dorzolamide-timolol (COSOPT) 22.3-6.8 MG/ML ophthalmic solution  08/20/16  Yes [provider]  EPINEPHrine 0.3 mg/0.3 mL IJ SOAJ injection Inject 0.3 mLs (0.3 mg total) into the muscle once as needed. 10/06/16  Yes Demetrios Loll, MD  furosemide (LASIX) 20 MG tablet Take 1 tablet (20 mg total) by mouth daily. 10/06/16  Yes Demetrios Loll, MD  latanoprost (XALATAN) 0.005 % ophthalmic solution Place 1 drop into both eyes at bedtime.  01/30/13  Yes [provider]  metoprolol succinate (TOPROL-XL) 25 MG 24 hr tablet metoprolol succinate ER 25 mg tablet,extended release 24 hr  Take 1 tablet every day by oral route.   Yes [provider]  MULTIPLE VITAMIN PO Take by mouth.   Yes [provider]  pantoprazole (PROTONIX) 40 MG tablet Take 40 mg by mouth daily. Reported on 07/02/2015 05/19/12  Yes [provider]  pramipexole (MIRAPEX) 0.25 MG tablet Take 0.25-0.5 mg by mouth 2 (two) times daily.    Yes [provider]  tiotropium (SPIRIVA HANDIHALER) 18 MCG inhalation capsule Place 18 mcg into inhaler and  inhale daily.  01/30/14  Yes [provider]  baclofen (LIORESAL) 10 MG tablet Take 0.5-1 tablets (5-10 mg total) by mouth 3 (three) times daily as needed for muscle spasms. 10/07/17   Leamon Palau, Barnie Del, DO  mometasone-formoterol (DULERA) 200-5 MCG/ACT AERO Dulera 200 mcg-5 mcg/actuation HFA aerosol inhaler    [provider]  traMADol (ULTRAM) 50 MG tablet Take 1 tablet (50 mg total) by mouth every 8 (eight) hours as needed. 10/07/17   Coral Spikes, DO    Family History History reviewed. No pertinent family history.  Social History Social History   Tobacco Use  . Smoking status: Never Smoker  . Smokeless tobacco: Never Used  Substance Use Topics  . Alcohol use: Yes    Alcohol/week: 0.0 oz    Comment: rare  . Drug use: No     Allergies   Galactose; Gabapentin; and Celecoxib   Review of Systems Review of Systems  Respiratory: Negative.   Cardiovascular: Positive for chest pain.   Physical Exam Triage Vital Signs ED Triage Vitals  Enc Vitals Group     BP 10/07/17 0911 (!) 137/95     Pulse Rate 10/07/17 0911 76     Resp 10/07/17 0911 18     Temp 10/07/17 0911 98.2 F (36.8 C)     Temp Source 10/07/17 0911 Oral     SpO2 10/07/17 0911 92 %     Weight 10/07/17 0910 183 lb (83 kg)     Height 10/07/17 0910 5\' 9"  (1.753 m)     Head Circumference --      Peak Flow --      Pain Score 10/07/17 0910 3     Pain Loc --      Pain Edu? --      Excl. in [place]? --    Updated Vital Signs BP (!) 137/95 (BP Location: Left Arm)   Pulse 76   Temp 98.2 F (36.8 C) (Oral)   Resp 18   Ht 5\' 9"  (1.753 m)   Wt 183 lb (83 kg)   SpO2 92%   BMI 27.02 kg/m  Physical Exam  Constitutional: He is oriented to person, place, and time. He appears well-developed. No distress.  HENT:  Head: Normocephalic and atraumatic.  Cardiovascular: Normal rate and regular rhythm.  Pulmonary/Chest: Effort normal and breath sounds normal. He has no wheezes. He has no rales.  Neurological: He  is alert and oriented to person, place, and time.  Psychiatric: He has a normal mood and affect. His behavior is normal.  Nursing note and vitals reviewed.  UC Treatments / Results  Labs (all labs ordered are listed, but only abnormal results are displayed) Labs Reviewed  CBC - Abnormal; Notable for the following components:      Result Value   MCV 100.3 (*)    RDW 14.6 (*)  Platelets 127 (*)    All other components within normal limits  COMPREHENSIVE METABOLIC PANEL - Abnormal; Notable for the following components:   Glucose, Bld 168 (*)    Calcium 8.6 (*)    All other components within normal limits  TROPONIN I   EKG  Date: 10/07/2017  EKG Time: 10:14 AM  Rate: 73   Rhythm: Normal sinus rhythm  Axis: Normal.  Intervals: Prolonged QT  ST&T Change: None.  Narrative Interpretation: Normal sinus rhythm with rate of 73.  Normal axis.  Q waves noted in V1, V2.  Prolonged QT.  No evidence of ischemia.  Radiology No results found.  Procedures Procedures (including critical care time)  Medications Ordered in UC Medications - No data to display  Initial Impression / Assessment and Plan / UC Course  I have reviewed the triage vital signs and the nursing notes.  Pertinent labs & imaging results that were available during my care of the patient were reviewed by me and considered in my medical decision making (see chart for details).    84 year old male presents with right-sided chest pain.  Clinically consistent with muscular skeletal/chest wall pain.  Laboratory studies were done given patient's cardiac history.  Troponin negative.  Treating pain with tramadol and baclofen.  Advised rest.  Final Clinical Impressions(s) / UC Diagnoses   Final diagnoses:  Right-sided chest pain     Discharge Instructions     Rest.  Medication as prescribed.  Take care  Dr. Lacinda Axon     ED Prescriptions    Medication Sig Dispense Auth. Provider   traMADol (ULTRAM) 50 MG tablet Take  1 tablet (50 mg total) by mouth every 8 (eight) hours as needed. 15 tablet Collins Dimaria G, DO   baclofen (LIORESAL) 10 MG tablet Take 0.5-1 tablets (5-10 mg total) by mouth 3 (three) times daily as needed for muscle spasms. 41 each Coral Spikes, DO     Controlled Substance Prescriptions [place] Controlled Substance Registry consulted? Not Applicable   Coral Spikes, DO 10/07/17 1015

## 2017-10-07 NOTE — ED Triage Notes (Signed)
Patient complains of right sided chest pain that started yesterday. Patient reports that he lifted some 5 gallon fuel pans yesterday. Patient states the pain is worse when he moves or breathes.

## 2018-01-20 ENCOUNTER — Other Ambulatory Visit: Payer: Self-pay

## 2018-01-20 ENCOUNTER — Encounter: Payer: Self-pay | Admitting: Physical Therapy

## 2018-01-20 ENCOUNTER — Ambulatory Visit: Payer: Medicare Other | Attending: Family Medicine | Admitting: Physical Therapy

## 2018-01-20 DIAGNOSIS — M6281 Muscle weakness (generalized): Secondary | ICD-10-CM | POA: Diagnosis not present

## 2018-01-20 DIAGNOSIS — R2689 Other abnormalities of gait and mobility: Secondary | ICD-10-CM | POA: Insufficient documentation

## 2018-01-20 NOTE — Therapy (Signed)
[place] MAIN [facility] SERVICES [address] [place], [place], [address] Phone: [phone]   Fax:  [phone]  Physical Therapy Evaluation  Patient Details  Name: Donald Riley MRN: [id number] Date of Birth: 1933/12/12 Referring Provider (PT): Hortencia Pilar MD   Encounter Date: 01/20/2018  PT End of Session - 01/20/18 1220    Visit Number  1    Number of Visits  17    Date for PT Re-Evaluation  03/17/18    Authorization Type  1/10 progress note    PT Start Time  3154    PT Stop Time  1225    PT Time Calculation (min)  54 min    Equipment Utilized During Treatment  Gait belt    Activity Tolerance  Patient tolerated treatment well;No increased pain    Behavior During Therapy  WFL for tasks assessed/performed       Past Medical History:  Diagnosis Date  . Arthritis   . Asthma   . Atrial fibrillation ([place])   . Cancer ([place]) 2002  . GERD (gastroesophageal reflux disease)   . Glaucoma   . Gout   . Heart murmur   . Hypertension   . Neuropathy   . Osteoporosis   . Sleep apnea     Past Surgical History:  Procedure Laterality Date  . ARTHROPLASTY Right    ankle  . BUNIONECTOMY    . CARDIAC CATHETERIZATION N/A 07/02/2015   Procedure: Right/Left Heart Cath and Coronary Angiography;  Surgeon: Teodoro Spray, MD;  Location: [place] [facility];  Service: Cardiovascular;  Laterality: N/A;  . CORONARY/GRAFT ACUTE MI REVASCULARIZATION N/A 10/02/2016   Procedure: Coronary/Graft Acute MI Revascularization;  Surgeon: Wellington Hampshire, MD;  Location: [place] [facility];  Service: Cardiovascular;  Laterality: N/A;  . JOINT REPLACEMENT    . ostatectomy      There were no vitals filed for this visit.   Subjective Assessment - 01/20/18 1133    Subjective  "I am concerned about my lack of mobility and weakness in my legs."     Pertinent History  84 yo Male reports increased weakness in BLE over last 6 months. Patient has a history of chronic  low back pain with radicular symptoms. He is s/p L1/L2, L5/S1 non-surgical fusion with degenerative disc disease. In addition CT of 2017 shows a moderate-severe central spinal stenosis and severe facet disease. He denies any surgery for lumbar spine; He reports spine center reports that he is not a surgical candidate as the spine is too severe.  He has been to a chiropractor in the past with no relief. Patient also has a diagnosis of polyneuropathy which could be contributing to his symptoms. He is ambulating with a SPC. He will be following up with spine surgeon and possibly get steroid injection. He has had steriod injections in the past with mixed results.     How long can you sit comfortably?  NA    How long can you stand comfortably?  20-30 min, he reports increased numbness in legs after a few minutes    How long can you walk comfortably?  He walks with cane/walker, takes longer time, able to walk >500 feet; has a harder time walking downhill    Diagnostic tests  MRI shows multiple lumbar segment stenosis/facet degeneration    Patient Stated Goals  Improve walking, increase strength in legs, feeling more steady;     Currently in Pain?  No/denies    Multiple  Pain Sites  No         [facility] PT Assessment - 01/20/18 0001      Assessment   Medical Diagnosis  LE weakness    Referring Provider (PT)  Hortencia Pilar MD    Onset Date/Surgical Date  06/28/17    Hand Dominance  Right    Next MD Visit  Feb 2019    Prior Therapy  had PT for neck pain and back pain ; none recent and none for this condition;       Precautions   Precautions  Fall    Required Braces or Orthoses  --   wears LSO corset, wears when working on machinery;      Restrictions   Weight Bearing Restrictions  No      Balance Screen   Has the patient fallen in the past 6 months  Yes    How many times?  1    Has the patient had a decrease in activity level because of a fear of falling?   Yes    Is the patient reluctant to  leave their home because of a fear of falling?   No      Home Environment   Additional Comments  Lives in single story home, no stairs to enter, 1 step inside with railing; uses rail when negotiating step; usually negotiates steps one at a time; mod I for self care, takes longer      Prior Function   Level of Independence  Independent with basic ADLs;Requires assistive device for independence    Vocation  Retired    Leisure  likes to work on [employer], forensic the computer,    able to walk on treadmill about a year ago;      Cognition   Overall Cognitive Status  Within Functional Limits for tasks assessed      Sensation   Light Touch  Impaired by gross assessment   R>L impaired; deep pressure present but impaired on right     Coordination   Gross Motor Movements are Fluid and Coordinated  Yes    Fine Motor Movements are Fluid and Coordinated  No    Finger Nose Finger Test  slower but accurate on RUE, normal LUE      Posture/Postural Control   Posture Comments  exhibits decreased lumbar lordosis, kyphotic posture, forward flexed head;       ROM / Strength   AROM / PROM / Strength  AROM;Strength      AROM   Overall AROM Comments  BUE and BLE are WFL, limited hip flexion with crossing legs      Strength   Overall Strength Comments  BUE are WFL    Right Hip Flexion  3+/5    Right Hip ABduction  3/5    Right Hip ADduction  2+/5    Left Hip Flexion  4-/5    Left Hip ABduction  4/5    Left Hip ADduction  4/5    Right Knee Flexion  3+/5    Right Knee Extension  4-/5    Left Knee Flexion  4-/5    Left Knee Extension  4-/5    Right Ankle Dorsiflexion  3-/5    Left Ankle Dorsiflexion  3-/5      Palpation   Palpation comment  denies any tenderness to hip;       Transfers   Comments  requires 2 HHA to push on arm rests to get up from chair;  Ambulation/Gait   Gait Comments  ambulates mod I with SPC, slower gait speed, shorter step length left with decreased hip extension  on RLE, good foot clearance bilaterally, forward flexed posture      Standardized Balance Assessment   Five times sit to stand comments   25.15 sec with 2 HHA    10 Meter Walk  0.4 m/s with SPC; indicates high fall risk, limited home ambulator      High Level Balance   High Level Balance Comments  Static standing balance fair, dynamic standing balance is poor; able to stand with feet apart, eyes closed, but immediate loss of balance with feet together eyes closed;                 Objective measurements completed on examination: See above findings.              PT Education - 01/20/18 1249    Education Details  recommendations, plan of care    Person(s) Educated  Patient    Methods  Explanation    Comprehension  Verbalized understanding       PT Short Term Goals - 01/20/18 1258      PT SHORT TERM GOAL #1   Title  Patient will be adherent to HEP at least 3x a week to improve functional strength and balance for better safety at home.    Time  4    Period  Weeks    Status  New    Target Date  02/17/18      PT SHORT TERM GOAL #2   Title  Patient will be able to transfer sit<>Stand from a regular height chair with 1 UE assist or less, independent to improve ability to get up out of chairs/couch.    Time  4    Period  Weeks    Status  New    Target Date  02/17/18        PT Long Term Goals - 01/20/18 1259      PT LONG TERM GOAL #1   Title  Patient will increase BLE gross strength to 4+/5 as to improve functional strength for independent gait, increased standing tolerance and increased ADL ability.    Time  8    Period  Weeks    Status  New    Target Date  03/17/18      PT LONG TERM GOAL #2   Title  Patient will increase 10 meter walk test to >1.37m/s as to improve gait speed for better community ambulation and to reduce fall risk.    Time  8    Period  Weeks    Status  New    Target Date  03/17/18      PT LONG TERM GOAL #3   Title  Patient (> 32  years old) will complete five times sit to stand test in < 15 seconds indicating an increased LE strength and improved balance.    Time  8    Period  Weeks    Status  New    Target Date  03/17/18      PT LONG TERM GOAL #4   Title  Patient will be mod I when negotiating a curb with LRAD to exhibit improved gait ability and safety in community.     Time  8    Period  Weeks    Status  New    Target Date  03/17/18  Plan - 01/20/18 1249    Clinical Impression Statement  84 yo Male reports increased numbness and weakness in BLE over last 6 months. Patient has chronic low back pain, recent CT/MRI shows significant stenosis and facet degeneration. Patient has increased numbness with prolonged standing/walking, less numbness with sitting. He exhibits increased weakness in BLE (R>L). He ambulates with SPC, mod I with short step length on LLE with decreased hip extension RLE. Hip extensor strength not assessed as patient unable to tolerate prone position. He does test as an increased risk for falls. He would benefit from additional skilled PT intervention to improve strength, balance and gait safety;     History and Personal Factors relevant to plan of care:  single story home, fell 6 months ago, progressive condition of lumbar stenosis with increased numbness in legs, co-morbidities    Clinical Presentation  Evolving    Clinical Presentation due to:  chronic back pain with progressive numbness/weakness in legs; high fall risk    Clinical Decision Making  Moderate    Rehab Potential  Fair    Clinical Impairments Affecting Rehab Potential  motivated; concerned about progressive stenosis with increased numbness in legs;     PT Frequency  2x / week    PT Duration  8 weeks    PT Treatment/Interventions  Aquatic Therapy;Cryotherapy;Electrical Stimulation;Moist Heat;Therapeutic exercise;Therapeutic activities;Functional mobility training;Stair training;Gait training;Balance  training;Neuromuscular re-education;Patient/family education;Orthotic Fit/Training;Manual techniques;Energy conservation    PT Next Visit Plan  initiate HEP    PT Home Exercise Plan  will address next visit    Consulted and Agree with Plan of Care  Patient       Patient will benefit from skilled therapeutic intervention in order to improve the following deficits and impairments:  Decreased endurance, Decreased activity tolerance, Decreased strength, Pain, Decreased balance, Decreased mobility, Difficulty walking, Postural dysfunction, Impaired flexibility, Decreased safety awareness  Visit Diagnosis: Muscle weakness (generalized)  Other abnormalities of gait and mobility     Problem List Patient Active Problem List   Diagnosis Date Noted  . Acute ST elevation myocardial infarction (STEMI) of anterolateral wall ([place])   . Angio-edema   . Nonsustained ventricular tachycardia ([place])   . Acute respiratory failure ([place])   . Airway obstruction   . Macroglossia   . Non-STEMI (non-ST elevated myocardial infarction) ([place])   . Anaphylaxis 09/30/2016    Christon Parada PT, DPT 01/20/2018, 1:02 PM  [place] MAIN [facility] SERVICES [address] [place], [place], [address] Phone: [phone]   Fax:  [phone]  Name: ANTHONI GEERTS MRN: [id number] Date of Birth: 1933/08/23

## 2018-01-24 ENCOUNTER — Ambulatory Visit: Payer: Medicare Other | Admitting: Physical Therapy

## 2018-01-24 ENCOUNTER — Encounter: Payer: Self-pay | Admitting: Physical Therapy

## 2018-01-24 DIAGNOSIS — M6281 Muscle weakness (generalized): Secondary | ICD-10-CM | POA: Diagnosis not present

## 2018-01-24 DIAGNOSIS — R2689 Other abnormalities of gait and mobility: Secondary | ICD-10-CM

## 2018-01-24 NOTE — Patient Instructions (Signed)
Access Code: [email or web address]  URL: [email or web address]  Date: 01/24/2018  Prepared by: Blanche East   Exercises  Supine Lower Trunk Rotation - 10 reps - 1 sets - 2x daily - 7x weekly  Hooklying Single Knee to Chest - 2 reps - 1 sets - 20 hold - 2x daily - 7x weekly  Seated March - 10 reps - 2 sets - 2x daily - 7x weekly  Seated Hip Abduction with Resistance - 10 reps - 2 sets - 2x daily - 7x weekly  Seated Hamstring Curl with Anchored Resistance - 10 reps - 2 sets - 2x daily - 7x weekly

## 2018-01-24 NOTE — Therapy (Addendum)
[place] MAIN [facility] SERVICES [address] [place], [place], [address] Phone: [phone]   Fax:  [phone]  Physical Therapy Treatment  Patient Details  Name: Donald Riley MRN: [id number] Date of Birth: [birth year] Referring Provider (PT): Hortencia Pilar MD   Encounter Date: 01/24/2018  PT End of Session - 01/24/18 1257    Visit Number  2    Number of Visits  17    Date for PT Re-Evaluation  03/17/18    Authorization Type  2/10 progress note    PT Start Time  1300    PT Stop Time  1345    PT Time Calculation (min)  45 min    Equipment Utilized During Treatment  Gait belt    Activity Tolerance  Patient tolerated treatment well;No increased pain    Behavior During Therapy  WFL for tasks assessed/performed       Past Medical History:  Diagnosis Date  . Arthritis   . Asthma   . Atrial fibrillation ([place])   . Cancer ([place]) 2002  . GERD (gastroesophageal reflux disease)   . Glaucoma   . Gout   . Heart murmur   . Hypertension   . Neuropathy   . Osteoporosis   . Sleep apnea     Past Surgical History:  Procedure Laterality Date  . ARTHROPLASTY Right    ankle  . BUNIONECTOMY    . CARDIAC CATHETERIZATION N/A 07/02/2015   Procedure: Right/Left Heart Cath and Coronary Angiography;  Surgeon: Teodoro Spray, MD;  Location: [place] [facility];  Service: Cardiovascular;  Laterality: N/A;  . CORONARY/GRAFT ACUTE MI REVASCULARIZATION N/A 10/02/2016   Procedure: Coronary/Graft Acute MI Revascularization;  Surgeon: Wellington Hampshire, MD;  Location: [place] [facility];  Service: Cardiovascular;  Laterality: N/A;  . JOINT REPLACEMENT    . ostatectomy      There were no vitals filed for this visit.  Subjective Assessment - 01/24/18 1310    Subjective  Patient reports he is doing okay; continues to have some low back pain. Pt reports he didn't have a full fall but did move a ladder and chainsaw fell and hit him on the head on Friday. Pt  doesn't have any complaints or pain regarding head and states he had a good weekend otherwise. Pt presents with rollator today; states he uses it to ambulate longer distances in community.     Pertinent History  84 yo Male reports increased weakness in BLE over last 6 months. Patient has a history of chronic low back pain with radicular symptoms. He is s/p L1/L2, L5/S1 non-surgical fusion with degenerative disc disease. In addition CT of 2017 shows a moderate-severe central spinal stenosis and severe facet disease. He denies any surgery for lumbar spine; He reports spine center reports that he is not a surgical candidate as the spine is too severe.  He has been to a chiropractor in the past with no relief. Patient also has a diagnosis of polyneuropathy which could be contributing to his symptoms. He is ambulating with a SPC. He will be following up with spine surgeon and possibly get steroid injection. He has had steriod injections in the past with mixed results.     How long can you sit comfortably?  NA    How long can you stand comfortably?  20-30 min, he reports increased numbness in legs after a few minutes    How long can you walk comfortably?  He walks with cane/walker, takes  longer time, able to walk >500 feet; has a harder time walking downhill    Diagnostic tests  MRI shows multiple lumbar segment stenosis/facet degeneration    Patient Stated Goals  Improve walking, increase strength in legs, feeling more steady;     Currently in Pain?  Yes    Pain Score  2     Pain Location  Back    Pain Orientation  Lower    Pain Descriptors / Indicators  Sore;Tightness    Pain Type  Chronic pain    Pain Radiating Towards  down BLE     Pain Onset  More than a month ago    Pain Frequency  Intermittent    Aggravating Factors   "being on my feet"    Pain Relieving Factors  sitting down    Effect of Pain on Daily Activities  unable to stay on feet for long periods of time    Multiple Pain Sites  No         Treatment Hooklying double knee trunk rotations x10 reps to each side with VCs for feeling stretch in low back without pain  Single knee to chest x20 sec holds x2 reps on each leg; VCs and demonstration for proper technique and to avoid painful ROM  Seated LE strengthening, provided handout for HEP including:  -Alternating marching x10 reps bilaterally -Hamstring curls with green tband x10 reps bilaterally -Hip abduction with green tband x10 reps bilaterally   Hamstring stretch x20 sec on each leg with PT assist   Ankle DF strengthening against green tband with PT assist x10 reps bilaterally  Sit <> stand practice from elevated surface x10 reps without UE support, CGA for safety with VCs for keeping weight shifted forwards              PT Education - 01/24/18 1256    Education Details  exercise technique, HEP    Person(s) Educated  Patient    Methods  Explanation;Demonstration;Verbal cues    Comprehension  Verbalized understanding;Returned demonstration;Verbal cues required;Need further instruction       PT Short Term Goals - 01/20/18 1258      PT SHORT TERM GOAL #1   Title  Patient will be adherent to HEP at least 3x a week to improve functional strength and balance for better safety at home.    Time  4    Period  Weeks    Status  New    Target Date  02/17/18      PT SHORT TERM GOAL #2   Title  Patient will be able to transfer sit<>Stand from a regular height chair with 1 UE assist or less, independent to improve ability to get up out of chairs/couch.    Time  4    Period  Weeks    Status  New    Target Date  02/17/18        PT Long Term Goals - 01/20/18 1259      PT LONG TERM GOAL #1   Title  Patient will increase BLE gross strength to 4+/5 as to improve functional strength for independent gait, increased standing tolerance and increased ADL ability.    Time  8    Period  Weeks    Status  New    Target Date  03/17/18      PT LONG TERM GOAL #2    Title  Patient will increase 10 meter walk test to >1.60m/s as to improve gait speed for better  community ambulation and to reduce fall risk.    Time  8    Period  Weeks    Status  New    Target Date  03/17/18      PT LONG TERM GOAL #3   Title  Patient (> 21 years old) will complete five times sit to stand test in < 15 seconds indicating an increased LE strength and improved balance.    Time  8    Period  Weeks    Status  New    Target Date  03/17/18      PT LONG TERM GOAL #4   Title  Patient will be mod I when negotiating a curb with LRAD to exhibit improved gait ability and safety in community.     Time  8    Period  Weeks    Status  New    Target Date  03/17/18            Plan - 01/24/18 1449    Clinical Impression Statement  Patient tolerated therapy session well. Instructed pt in HEP including double knee trunk rotations and single knee to chest stretch. Pt also instructed pt in seated LE strengthening to complete at home; VCs for proper technique and sequencing of all exercises. Pt complained of tightness in posterior knee when extending knee; PT assisted in stretching hamstrings with pt's ankle on PT's knee. Pt denied any increases in pain but did feel stretch. Pt demonstrated good ability to perform sit <> stand without UE support from elevated mat table; VCs for reaching forward with CGA for safety. Pt will continue to benefit from skilled Pt intervention for improvements in balance, strength, and gait safety.    Rehab Potential  Fair    Clinical Impairments Affecting Rehab Potential  motivated; concerned about progressive stenosis with increased numbness in legs;     PT Frequency  2x / week    PT Duration  8 weeks    PT Treatment/Interventions  Aquatic Therapy;Cryotherapy;Electrical Stimulation;Moist Heat;Therapeutic exercise;Therapeutic activities;Functional mobility training;Stair training;Gait training;Balance training;Neuromuscular re-education;Patient/family  education;Orthotic Fit/Training;Manual techniques;Energy conservation    PT Next Visit Plan  initiate HEP    PT Home Exercise Plan  seated marching, hamstring curls, and hip abduction with green tband; trunk rotation and single knee to chest in AM and PM    Consulted and Agree with Plan of Care  Patient       Patient will benefit from skilled therapeutic intervention in order to improve the following deficits and impairments:  Decreased endurance, Decreased activity tolerance, Decreased strength, Pain, Decreased balance, Decreased mobility, Difficulty walking, Postural dysfunction, Impaired flexibility, Decreased safety awareness  Visit Diagnosis: Muscle weakness (generalized)  Other abnormalities of gait and mobility     Problem List Patient Active Problem List   Diagnosis Date Noted  . Acute ST elevation myocardial infarction (STEMI) of anterolateral wall ([place])   . Angio-edema   . Nonsustained ventricular tachycardia ([place])   . Acute respiratory failure ([place])   . Airway obstruction   . Macroglossia   . Non-STEMI (non-ST elevated myocardial infarction) ([place])   . Anaphylaxis 09/30/2016   Harriet Masson, SPT This entire session was performed under direct supervision and direction of a licensed therapist/therapist assistant . I have personally read, edited and approve of the note as written.  Trotter,Margaret PT, DPT 01/25/2018, 9:20 AM  [place] MAIN [facility] SERVICES [address] [place], [place], [address] Phone: [phone]   Fax:  [phone]  Name:  Donald Riley MRN: [id number] Date of Birth: [birth year]

## 2018-01-27 ENCOUNTER — Encounter: Payer: Self-pay | Admitting: Physical Therapy

## 2018-01-27 ENCOUNTER — Ambulatory Visit: Payer: Medicare Other | Admitting: Physical Therapy

## 2018-01-27 DIAGNOSIS — M6281 Muscle weakness (generalized): Secondary | ICD-10-CM

## 2018-01-27 DIAGNOSIS — R2689 Other abnormalities of gait and mobility: Secondary | ICD-10-CM

## 2018-01-27 NOTE — Therapy (Addendum)
[place] MAIN [facility] SERVICES [address] [place], [place], [address] Phone: [phone]   Fax:  [phone]  Physical Therapy Treatment  Patient Details  Name: Donald Riley MRN: [id number] Date of Birth: 11-Dec-1933 Referring Provider (PT): Hortencia Pilar MD   Encounter Date: 01/27/2018  PT End of Session - 01/27/18 1153    Visit Number  3    Number of Visits  17    Date for PT Re-Evaluation  03/17/18    Authorization Type  3/10 progress note    PT Start Time  1145    PT Stop Time  1230    PT Time Calculation (min)  45 min    Activity Tolerance  Patient tolerated treatment well;No increased pain    Behavior During Therapy  WFL for tasks assessed/performed       Past Medical History:  Diagnosis Date  . Arthritis   . Asthma   . Atrial fibrillation ([place])   . Cancer ([place]) 2002  . GERD (gastroesophageal reflux disease)   . Glaucoma   . Gout   . Heart murmur   . Hypertension   . Neuropathy   . Osteoporosis   . Sleep apnea     Past Surgical History:  Procedure Laterality Date  . ARTHROPLASTY Right    ankle  . BUNIONECTOMY    . CARDIAC CATHETERIZATION N/A 07/02/2015   Procedure: Right/Left Heart Cath and Coronary Angiography;  Surgeon: Teodoro Spray, MD;  Location: [place] [facility];  Service: Cardiovascular;  Laterality: N/A;  . CORONARY/GRAFT ACUTE MI REVASCULARIZATION N/A 10/02/2016   Procedure: Coronary/Graft Acute MI Revascularization;  Surgeon: Wellington Hampshire, MD;  Location: [place] [facility];  Service: Cardiovascular;  Laterality: N/A;  . JOINT REPLACEMENT    . ostatectomy      There were no vitals filed for this visit.  Subjective Assessment - 01/27/18 1150    Subjective  Patient reports he was having pain when he first woke up this morning in his L leg (5/10) which has decreased with being up this morning. He states he no longer has pain today now that he's been up and moving around. He says the HEP has been  going well and currently does not have any questions.     Pertinent History  84 yo Male reports increased weakness in BLE over last 6 months. Patient has a history of chronic low back pain with radicular symptoms. He is s/p L1/L2, L5/S1 non-surgical fusion with degenerative disc disease. In addition CT of 2017 shows a moderate-severe central spinal stenosis and severe facet disease. He denies any surgery for lumbar spine; He reports spine center reports that he is not a surgical candidate as the spine is too severe.  He has been to a chiropractor in the past with no relief. Patient also has a diagnosis of polyneuropathy which could be contributing to his symptoms. He is ambulating with a SPC. He will be following up with spine surgeon and possibly get steroid injection. He has had steriod injections in the past with mixed results.     How long can you sit comfortably?  NA    How long can you stand comfortably?  20-30 min, he reports increased numbness in legs after a few minutes    How long can you walk comfortably?  He walks with cane/walker, takes longer time, able to walk >500 feet; has a harder time walking downhill    Diagnostic tests  MRI shows multiple  lumbar segment stenosis/facet degeneration    Patient Stated Goals  Improve walking, increase strength in legs, feeling more steady;     Currently in Pain?  No/denies       Treatment  Hooklying: -Double knee trunk rotation x10 reps x10 sec holds to each side -Single knee to chest x20 sec holds x2 reps each leg -B feet up on physioball, double knee to chest x2 min with VCs for proper technique and form -B feet up on physioball, pelvic rocks x1 min with VCs and tactile cuing for proper technique  -Bridges x15 reps, VCs for proper technique and avoiding painful ROM  Supine: -Passive hamstring stretch x30 sec holds x2 reps on each leg; able to increase amount of stretch with second repetition by extending leg up higher  -SLR with 2# ankle  weights x10 reps bilaterally; VCs to avoid painful ROM  Seated: -Hip ABD/ADD against PT resistance x5 sec holds x5 reps bilaterally -RLE sciatic nerve glide in sitting x5 reps              PT Education - 01/27/18 1152    Education Details  exercise technique/form    Person(s) Educated  Patient    Methods  Explanation;Demonstration;Verbal cues    Comprehension  Verbalized understanding;Returned demonstration;Verbal cues required;Need further instruction       PT Short Term Goals - 01/20/18 1258      PT SHORT TERM GOAL #1   Title  Patient will be adherent to HEP at least 3x a week to improve functional strength and balance for better safety at home.    Time  4    Period  Weeks    Status  New    Target Date  02/17/18      PT SHORT TERM GOAL #2   Title  Patient will be able to transfer sit<>Stand from a regular height chair with 1 UE assist or less, independent to improve ability to get up out of chairs/couch.    Time  4    Period  Weeks    Status  New    Target Date  02/17/18        PT Long Term Goals - 01/20/18 1259      PT LONG TERM GOAL #1   Title  Patient will increase BLE gross strength to 4+/5 as to improve functional strength for independent gait, increased standing tolerance and increased ADL ability.    Time  8    Period  Weeks    Status  New    Target Date  03/17/18      PT LONG TERM GOAL #2   Title  Patient will increase 10 meter walk test to >1.41m/s as to improve gait speed for better community ambulation and to reduce fall risk.    Time  8    Period  Weeks    Status  New    Target Date  03/17/18      PT LONG TERM GOAL #3   Title  Patient (> 25 years old) will complete five times sit to stand test in < 15 seconds indicating an increased LE strength and improved balance.    Time  8    Period  Weeks    Status  New    Target Date  03/17/18      PT LONG TERM GOAL #4   Title  Patient will be mod I when negotiating a curb with LRAD to exhibit  improved gait ability and safety in  community.     Time  8    Period  Weeks    Status  New    Target Date  03/17/18            Plan - 01/27/18 1232    Clinical Impression Statement  Patient tolerated therapy session well. Instructed patient in LE strengthening and stretching techniques for decreased discomfort. Pt required VCs and tactile cuing for proper exercise technique and sequencing; decreased cuing with repetition. Pt continues to demonstrate increased hamstring tightness; performed passive hamstring stretch in supine to address. Pt will continue to benefit from skilled Pt intervention for improvements in balance, strength, and gait safety.    Rehab Potential  Fair    Clinical Impairments Affecting Rehab Potential  motivated; concerned about progressive stenosis with increased numbness in legs;     PT Frequency  2x / week    PT Duration  8 weeks    PT Treatment/Interventions  Aquatic Therapy;Cryotherapy;Electrical Stimulation;Moist Heat;Therapeutic exercise;Therapeutic activities;Functional mobility training;Stair training;Gait training;Balance training;Neuromuscular re-education;Patient/family education;Orthotic Fit/Training;Manual techniques;Energy conservation    PT Next Visit Plan  initiate HEP    PT Home Exercise Plan  seated marching, hamstring curls, and hip abduction with green tband; trunk rotation and single knee to chest in AM and PM    Consulted and Agree with Plan of Care  Patient       Patient will benefit from skilled therapeutic intervention in order to improve the following deficits and impairments:  Decreased endurance, Decreased activity tolerance, Decreased strength, Pain, Decreased balance, Decreased mobility, Difficulty walking, Postural dysfunction, Impaired flexibility, Decreased safety awareness  Visit Diagnosis: Muscle weakness (generalized)  Other abnormalities of gait and mobility     Problem List Patient Active Problem List   Diagnosis Date  Noted  . Acute ST elevation myocardial infarction (STEMI) of anterolateral wall ([place])   . Angio-edema   . Nonsustained ventricular tachycardia ([place])   . Acute respiratory failure ([place])   . Airway obstruction   . Macroglossia   . Non-STEMI (non-ST elevated myocardial infarction) ([place])   . Anaphylaxis 09/30/2016   Harriet Masson, SPT This entire session was performed under direct supervision and direction of a licensed therapist/therapist assistant . I have personally read, edited and approve of the note as written.   Trotter,Margaret PT, DPT 01/27/2018, 2:16 PM  [place] MAIN [facility] SERVICES [address] [place], [place], [address] Phone: [phone]   Fax:  [phone]  Name: JALEEL ALLEN MRN: [id number] Date of Birth: January 26, 1934

## 2018-02-03 ENCOUNTER — Ambulatory Visit: Payer: Medicare Other | Attending: Family Medicine | Admitting: Physical Therapy

## 2018-02-03 ENCOUNTER — Encounter: Payer: Self-pay | Admitting: Physical Therapy

## 2018-02-03 DIAGNOSIS — M6281 Muscle weakness (generalized): Secondary | ICD-10-CM

## 2018-02-03 DIAGNOSIS — R2689 Other abnormalities of gait and mobility: Secondary | ICD-10-CM

## 2018-02-03 NOTE — Therapy (Signed)
[place] MAIN [facility] SERVICES [place], [place], [address] Phone: [phone]   Fax:  [phone]  Physical Therapy Treatment  Patient Details  Name: Donald Riley MRN: [id number] Date of Birth: 13-Nov-1933 Referring Provider (PT): Hortencia Pilar MD   Encounter Date: 02/03/2018  PT End of Session - 02/03/18 1440    Visit Number  4    Number of Visits  17    Date for PT Re-Evaluation  03/17/18    Authorization Type  4/10 progress note    PT Start Time  1430    PT Stop Time  1515    PT Time Calculation (min)  45 min    Activity Tolerance  Patient tolerated treatment well;No increased pain    Behavior During Therapy  WFL for tasks assessed/performed       Past Medical History:  Diagnosis Date  . Arthritis   . Asthma   . Atrial fibrillation ([place])   . Cancer ([place]) 2002  . GERD (gastroesophageal reflux disease)   . Glaucoma   . Gout   . Heart murmur   . Hypertension   . Neuropathy   . Osteoporosis   . Sleep apnea     Past Surgical History:  Procedure Laterality Date  . ARTHROPLASTY Right    ankle  . BUNIONECTOMY    . CARDIAC CATHETERIZATION N/A 07/02/2015   Procedure: Right/Left Heart Cath and Coronary Angiography;  Surgeon: Teodoro Spray, MD;  Location: [place] [facility];  Service: Cardiovascular;  Laterality: N/A;  . CORONARY/GRAFT ACUTE MI REVASCULARIZATION N/A 10/02/2016   Procedure: Coronary/Graft Acute MI Revascularization;  Surgeon: Wellington Hampshire, MD;  Location: [place] [facility];  Service: Cardiovascular;  Laterality: N/A;  . JOINT REPLACEMENT    . ostatectomy      There were no vitals filed for this visit.  Subjective Assessment - 02/03/18 1440    Subjective  Patient reports doing okay today. He reports having 2 good days over the weekend with improved walking; He reports increased pain in low back this morning of 6/10, but no pain currently. Patient states, "Its better in the afternoon." He denies  any new falls;     Pertinent History  84 yo Male reports increased weakness in BLE over last 6 months. Patient has a history of chronic low back pain with radicular symptoms. He is s/p L1/L2, L5/S1 non-surgical fusion with degenerative disc disease. In addition CT of 2017 shows a moderate-severe central spinal stenosis and severe facet disease. He denies any surgery for lumbar spine; He reports spine center reports that he is not a surgical candidate as the spine is too severe.  He has been to a chiropractor in the past with no relief. Patient also has a diagnosis of polyneuropathy which could be contributing to his symptoms. He is ambulating with a SPC. He will be following up with spine surgeon and possibly get steroid injection. He has had steriod injections in the past with mixed results.     How long can you sit comfortably?  NA    How long can you stand comfortably?  20-30 min, he reports increased numbness in legs after a few minutes    How long can you walk comfortably?  He walks with cane/walker, takes longer time, able to walk >500 feet; has a harder time walking downhill    Diagnostic tests  MRI shows multiple lumbar segment stenosis/facet degeneration    Patient Stated Goals  Improve walking,  increase strength in legs, feeling more steady;     Currently in Pain?  No/denies         Treatment  Hooklying: (Concurrent with moist heat to low back) -Double knee trunk rotation x10 reps x10 sec holds to each side -Single knee to chest x20 sec holds x2 reps each leg -Bridges x15 reps, VCs for proper technique and avoiding painful ROM Green tband around BLE: -hip flexion march x15 bilaterally; -hip abduction/ER x15 bilaterally Patient required min-moderate verbal/tactile cues for correct exercise technique including cues to avoid painful ROM and to increase ROM for better strengthening;     Leg press: BLE plate 75# [id number] with min VCs for correct exercise technique; BLE heel raises plate 75#  [id number] with min VCs for correct positioning for improved calf strengthening;   Seated: Red tband BUE rows x15 with min VCs for correct posture and to improve scapular retraction Standing: Red tband BUE rows x15 with close supervision to challenge posture in standing; BUE wand flexion with feet shoulder width apart x10 reps with close supervision for safety;  Standing with green tband around BLE: Side stepping x10 feet x2 laps each direction with min VCs to improve erect posture with advanced exercise;  Hamstring curl, x10 bilaterally;  Patient tolerated standing exercise well, denying any increase in numbness in BLE with advanced exercise.    Finished with Nustep, BUE/BLE level 3 x5 min (unbilled), seat #12, arms #10                 PT Education - 02/03/18 1440    Education Details  exercise technique, HEP reinforced    Person(s) Educated  Patient    Methods  Explanation;Demonstration;Verbal cues    Comprehension  Verbalized understanding;Returned demonstration;Verbal cues required;Need further instruction       PT Short Term Goals - 01/20/18 1258      PT SHORT TERM GOAL #1   Title  Patient will be adherent to HEP at least 3x a week to improve functional strength and balance for better safety at home.    Time  4    Period  Weeks    Status  New    Target Date  02/17/18      PT SHORT TERM GOAL #2   Title  Patient will be able to transfer sit<>Stand from a regular height chair with 1 UE assist or less, independent to improve ability to get up out of chairs/couch.    Time  4    Period  Weeks    Status  New    Target Date  02/17/18        PT Long Term Goals - 01/20/18 1259      PT LONG TERM GOAL #1   Title  Patient will increase BLE gross strength to 4+/5 as to improve functional strength for independent gait, increased standing tolerance and increased ADL ability.    Time  8    Period  Weeks    Status  New    Target Date  03/17/18      PT LONG TERM GOAL  #2   Title  Patient will increase 10 meter walk test to >1.5m/s as to improve gait speed for better community ambulation and to reduce fall risk.    Time  8    Period  Weeks    Status  New    Target Date  03/17/18      PT LONG TERM GOAL #3   Title  Patient (>  75 years old) will complete five times sit to stand test in < 15 seconds indicating an increased LE strength and improved balance.    Time  8    Period  Weeks    Status  New    Target Date  03/17/18      PT LONG TERM GOAL #4   Title  Patient will be mod I when negotiating a curb with LRAD to exhibit improved gait ability and safety in community.     Time  8    Period  Weeks    Status  New    Target Date  03/17/18            Plan - 02/03/18 1445    Clinical Impression Statement  Patient instructed in LE stretches and lumbar flexion ROM to reduce stiffness in low back. Patient tolerated well. Instructed patient in advanced LE strengthening for better mobility. Patient does require min VCs for correct exercise technique. Patient responded well to cues with improved ROM and better motor control. He would benefit from additional skilled PT Intervention to improve strength, balance and gait safety;     Rehab Potential  Fair    Clinical Impairments Affecting Rehab Potential  motivated; concerned about progressive stenosis with increased numbness in legs;     PT Frequency  2x / week    PT Duration  8 weeks    PT Treatment/Interventions  Aquatic Therapy;Cryotherapy;Electrical Stimulation;Moist Heat;Therapeutic exercise;Therapeutic activities;Functional mobility training;Stair training;Gait training;Balance training;Neuromuscular re-education;Patient/family education;Orthotic Fit/Training;Manual techniques;Energy conservation    PT Next Visit Plan  initiate HEP    PT Home Exercise Plan  seated marching, hamstring curls, and hip abduction with green tband; trunk rotation and single knee to chest in AM and PM    Consulted and Agree with  Plan of Care  Patient       Patient will benefit from skilled therapeutic intervention in order to improve the following deficits and impairments:  Decreased endurance, Decreased activity tolerance, Decreased strength, Pain, Decreased balance, Decreased mobility, Difficulty walking, Postural dysfunction, Impaired flexibility, Decreased safety awareness  Visit Diagnosis: Muscle weakness (generalized)  Other abnormalities of gait and mobility     Problem List Patient Active Problem List   Diagnosis Date Noted  . Acute ST elevation myocardial infarction (STEMI) of anterolateral wall ([place])   . Angio-edema   . Nonsustained ventricular tachycardia ([place])   . Acute respiratory failure ([place])   . Airway obstruction   . Macroglossia   . Non-STEMI (non-ST elevated myocardial infarction) ([place])   . Anaphylaxis 09/30/2016    , PT, DPT 02/03/2018, 3:10 PM  [place] MAIN [facility] SERVICES [address] [place], [place], [address] Phone: [phone]   Fax:  [phone]  Name: Donald Riley MRN: [id number] Date of Birth: [birth year]

## 2018-02-10 ENCOUNTER — Ambulatory Visit: Payer: Medicare Other

## 2018-02-10 VITALS — BP 123/66 | HR 67

## 2018-02-10 DIAGNOSIS — M6281 Muscle weakness (generalized): Secondary | ICD-10-CM | POA: Diagnosis not present

## 2018-02-10 DIAGNOSIS — R2689 Other abnormalities of gait and mobility: Secondary | ICD-10-CM

## 2018-02-10 NOTE — Therapy (Signed)
[place] MAIN [facility] SERVICES [address] [place], [place], [address] Phone: [phone]   Fax:  [phone]  Physical Therapy Treatment  Patient Details  Name: Donald Riley MRN: [id number] Date of Birth: 08/21/1933 Referring Provider (PT): Hortencia Pilar MD   Encounter Date: 02/10/2018  PT End of Session - 02/10/18 0917    Visit Number  5    Number of Visits  17    Date for PT Re-Evaluation  03/17/18    Authorization Type  4/10 progress note    PT Start Time  0900    PT Stop Time  0945    PT Time Calculation (min)  45 min    Equipment Utilized During Treatment  Gait belt    Activity Tolerance  Patient tolerated treatment well    Behavior During Therapy  [facility] for tasks assessed/performed       Past Medical History:  Diagnosis Date  . Arthritis   . Asthma   . Atrial fibrillation ([place])   . Cancer ([place]) 2002  . GERD (gastroesophageal reflux disease)   . Glaucoma   . Gout   . Heart murmur   . Hypertension   . Neuropathy   . Osteoporosis   . Sleep apnea     Past Surgical History:  Procedure Laterality Date  . ARTHROPLASTY Right    ankle  . BUNIONECTOMY    . CARDIAC CATHETERIZATION N/A 07/02/2015   Procedure: Right/Left Heart Cath and Coronary Angiography;  Surgeon: Teodoro Spray, MD;  Location: [place] [facility];  Service: Cardiovascular;  Laterality: N/A;  . CORONARY/GRAFT ACUTE MI REVASCULARIZATION N/A 10/02/2016   Procedure: Coronary/Graft Acute MI Revascularization;  Surgeon: Wellington Hampshire, MD;  Location: [place] [facility];  Service: Cardiovascular;  Laterality: N/A;  . JOINT REPLACEMENT    . ostatectomy      Vitals:   02/10/18 0905  BP: 123/66  Pulse: 67  SpO2: 99%    Subjective Assessment - 02/10/18 0908    Subjective  Patient reports doing okay today. He denies any back pain currently. No specific questions or concerns at this time.  No changes in health or medication. No falls reported    Currently in  Pain?  No/denies         TREATMENT  Ther-ex  Nustep, BUE/BLE level 3 x 5 min during history (3 minutes unbilled) Leg press: BLE plate 90# x 15, 505# x 15; with min VCs for correct exercise technique; BLE heel raises plate 90# 2 x 15 with min VCs and tactile cues for correct positioning for improved calf strengthening;   Green tband around BLE: Hip flexion march x 10 bilaterally; Hip abduction/ER x 10 bilaterally Hip adduction squeeze with manual resistance x 10;  Sit to stand without UE support from elevated mat table x 10, lowered mat table and pt completed an additional 5 but stopped after 5 reps secondary to fatigue; Red tband BUE rows x15 with min VCs for correct posture and to improve scapular retraction Standing mini squats in // bars x 10;   Pt educated throughout session about proper posture and technique with exercises. Improved exercise technique, movement at target joints, use of target muscles after min to mod verbal, visual, tactile cues.     Session today focused on LE strengthening as pt denies any pain upon arrival. Pt denies any increase in pain with exercises today. He is able to tolerate increased resistance with leg press today and therapist discussed exercises  he can perform at his local gym for LE strengthening. Patient does require min VCs for correct exercise technique. Patient responded well to cues with improved ROM and better motor control. He would benefit from additional skilled PT Intervention to improve strength, balance and gait safety;                        PT Short Term Goals - 01/20/18 1258      PT SHORT TERM GOAL #1   Title  Patient will be adherent to HEP at least 3x a week to improve functional strength and balance for better safety at home.    Time  4    Period  Weeks    Status  New    Target Date  02/17/18      PT SHORT TERM GOAL #2   Title  Patient will be able to transfer sit<>Stand from a regular height chair  with 1 UE assist or less, independent to improve ability to get up out of chairs/couch.    Time  4    Period  Weeks    Status  New    Target Date  02/17/18        PT Long Term Goals - 01/20/18 1259      PT LONG TERM GOAL #1   Title  Patient will increase BLE gross strength to 4+/5 as to improve functional strength for independent gait, increased standing tolerance and increased ADL ability.    Time  8    Period  Weeks    Status  New    Target Date  03/17/18      PT LONG TERM GOAL #2   Title  Patient will increase 10 meter walk test to >1.50m/s as to improve gait speed for better community ambulation and to reduce fall risk.    Time  8    Period  Weeks    Status  New    Target Date  03/17/18      PT LONG TERM GOAL #3   Title  Patient (> 49 years old) will complete five times sit to stand test in < 15 seconds indicating an increased LE strength and improved balance.    Time  8    Period  Weeks    Status  New    Target Date  03/17/18      PT LONG TERM GOAL #4   Title  Patient will be mod I when negotiating a curb with LRAD to exhibit improved gait ability and safety in community.     Time  8    Period  Weeks    Status  New    Target Date  03/17/18            Plan - 02/10/18 1112    Clinical Impression Statement  Session today focused on LE strengthening as pt denies any pain upon arrival. Pt denies any increase in pain with exercises today. He is able to tolerate increased resistance with leg press today and therapist discussed exercises he can perform at his local gym for LE strengthening. Patient does require min VCs for correct exercise technique. Patient responded well to cues with improved ROM and better motor control. He would benefit from additional skilled PT Intervention to improve strength, balance and gait safety;     Rehab Potential  Fair    Clinical Impairments Affecting Rehab Potential  motivated; concerned about progressive stenosis with increased numbness  in legs;  PT Frequency  2x / week    PT Duration  8 weeks    PT Treatment/Interventions  Aquatic Therapy;Cryotherapy;Electrical Stimulation;Moist Heat;Therapeutic exercise;Therapeutic activities;Functional mobility training;Stair training;Gait training;Balance training;Neuromuscular re-education;Patient/family education;Orthotic Fit/Training;Manual techniques;Energy conservation    PT Next Visit Plan  Advance HEP, progress resistance and strength training    PT Home Exercise Plan  seated marching, hamstring curls, and hip abduction with green tband; trunk rotation and single knee to chest in AM and PM, Standing mini squats    Consulted and Agree with Plan of Care  Patient       Patient will benefit from skilled therapeutic intervention in order to improve the following deficits and impairments:  Decreased endurance, Decreased activity tolerance, Decreased strength, Pain, Decreased balance, Decreased mobility, Difficulty walking, Postural dysfunction, Impaired flexibility, Decreased safety awareness  Visit Diagnosis: Muscle weakness (generalized)  Other abnormalities of gait and mobility     Problem List Patient Active Problem List   Diagnosis Date Noted  . Acute ST elevation myocardial infarction (STEMI) of anterolateral wall ([place])   . Angio-edema   . Nonsustained ventricular tachycardia ([place])   . Acute respiratory failure ([place])   . Airway obstruction   . Macroglossia   . Non-STEMI (non-ST elevated myocardial infarction) ([place])   . Anaphylaxis 09/30/2016   Phillips Grout PT, DPT, GCS  Huprich,Jason 02/10/2018, 11:15 AM  [place] MAIN [facility] SERVICES [address] [place], [place], [address] Phone: [phone]   Fax:  [phone]  Name: Donald Riley MRN: [id number] Date of Birth: [birth year]

## 2018-02-10 NOTE — Patient Instructions (Signed)
Access Code: [email or web address]  URL: [email or web address]  Date: 02/10/2018  Prepared by: Roxana Hires   Exercises  Mini Squat with Counter Support - 10 reps - 2 sets - 1x daily - 7x weekly

## 2018-02-14 ENCOUNTER — Ambulatory Visit: Payer: Medicare Other

## 2018-02-14 ENCOUNTER — Encounter: Payer: Self-pay | Admitting: Emergency Medicine

## 2018-02-14 ENCOUNTER — Other Ambulatory Visit: Payer: Self-pay

## 2018-02-14 ENCOUNTER — Ambulatory Visit
Admission: EM | Admit: 2018-02-14 | Discharge: 2018-02-14 | Disposition: A | Discharge status: Home / Self Care | Payer: Medicare Other | Attending: Family Medicine | Admitting: Family Medicine

## 2018-02-14 ENCOUNTER — Ambulatory Visit: Payer: Medicare Other | Admitting: Physical Therapy

## 2018-02-14 DIAGNOSIS — K219 Gastro-esophageal reflux disease without esophagitis: Secondary | ICD-10-CM | POA: Diagnosis not present

## 2018-02-14 DIAGNOSIS — Z7951 Long term (current) use of inhaled steroids: Secondary | ICD-10-CM | POA: Diagnosis not present

## 2018-02-14 DIAGNOSIS — I10 Essential (primary) hypertension: Secondary | ICD-10-CM | POA: Insufficient documentation

## 2018-02-14 DIAGNOSIS — H409 Unspecified glaucoma: Secondary | ICD-10-CM | POA: Insufficient documentation

## 2018-02-14 DIAGNOSIS — J45909 Unspecified asthma, uncomplicated: Secondary | ICD-10-CM | POA: Insufficient documentation

## 2018-02-14 DIAGNOSIS — W19XXXA Unspecified fall, initial encounter: Secondary | ICD-10-CM | POA: Diagnosis not present

## 2018-02-14 DIAGNOSIS — Z7982 Long term (current) use of aspirin: Secondary | ICD-10-CM | POA: Diagnosis not present

## 2018-02-14 DIAGNOSIS — G629 Polyneuropathy, unspecified: Secondary | ICD-10-CM | POA: Insufficient documentation

## 2018-02-14 DIAGNOSIS — G4730 Sleep apnea, unspecified: Secondary | ICD-10-CM | POA: Diagnosis not present

## 2018-02-14 DIAGNOSIS — Z791 Long term (current) use of non-steroidal anti-inflammatories (NSAID): Secondary | ICD-10-CM | POA: Diagnosis not present

## 2018-02-14 DIAGNOSIS — Z79899 Other long term (current) drug therapy: Secondary | ICD-10-CM | POA: Insufficient documentation

## 2018-02-14 DIAGNOSIS — Z823 Family history of stroke: Secondary | ICD-10-CM | POA: Diagnosis not present

## 2018-02-14 DIAGNOSIS — M1990 Unspecified osteoarthritis, unspecified site: Secondary | ICD-10-CM | POA: Insufficient documentation

## 2018-02-14 DIAGNOSIS — I252 Old myocardial infarction: Secondary | ICD-10-CM | POA: Insufficient documentation

## 2018-02-14 DIAGNOSIS — Z7902 Long term (current) use of antithrombotics/antiplatelets: Secondary | ICD-10-CM | POA: Diagnosis not present

## 2018-02-14 DIAGNOSIS — M25561 Pain in right knee: Secondary | ICD-10-CM

## 2018-02-14 DIAGNOSIS — Z888 Allergy status to other drugs, medicaments and biological substances status: Secondary | ICD-10-CM | POA: Diagnosis not present

## 2018-02-14 DIAGNOSIS — M1711 Unilateral primary osteoarthritis, right knee: Secondary | ICD-10-CM | POA: Insufficient documentation

## 2018-02-14 DIAGNOSIS — Z8249 Family history of ischemic heart disease and other diseases of the circulatory system: Secondary | ICD-10-CM | POA: Diagnosis not present

## 2018-02-14 DIAGNOSIS — M109 Gout, unspecified: Secondary | ICD-10-CM | POA: Insufficient documentation

## 2018-02-14 DIAGNOSIS — I4891 Unspecified atrial fibrillation: Secondary | ICD-10-CM | POA: Diagnosis not present

## 2018-02-14 HISTORY — DX: Allergy to other foods: Z91.018

## 2018-02-14 MED ORDER — PREDNISONE 20 MG PO TABS: 20.0000 mg | ORAL_TABLET | Freq: Every day | ORAL | 0 refills | Status: DC

## 2018-02-14 NOTE — ED Triage Notes (Signed)
Patient took Hydrocodone 5/325mg  and Colchicine .6mg  for the pain. He states he had this left over from previous treatments, although both have expired.

## 2018-02-14 NOTE — ED Triage Notes (Signed)
Patient in today c/o right knee pain. Patient fell on 02/13/18 and started having pain later that night after the fall. Patient thinks this is gout, but has only had gout in the past in his ankle.

## 2018-02-14 NOTE — ED Provider Notes (Signed)
[facility]    CSN: [id number] Arrival date & time: 02/14/18  1035     History   Chief Complaint Chief Complaint  Patient presents with  . Fall    APPT DOI 02/13/18  . Knee Pain    right    HPI Donald Riley is a 84 y.o. male.   84 yo male with a c/o right knee pain since falling yesterday, however patient states he did not fall on his knee. Complains of slight swelling and moderate to severe pain. He took some hydrocodone and a colchicine this morning with some relief. States he has a h/o gout and not sure if this could be gout. Denies any fevers, chills, redness.   The history is provided by the patient.  Fall   Knee Pain    Past Medical History:  Diagnosis Date  . Allergy to alpha-gal   . Arthritis   . Asthma   . Atrial fibrillation ([place])   . Cancer ([place]) 2002  . GERD (gastroesophageal reflux disease)   . Glaucoma   . Gout   . Heart murmur   . Hypertension   . Neuropathy   . Osteoporosis   . Sleep apnea     Patient Active Problem List   Diagnosis Date Noted  . Acute ST elevation myocardial infarction (STEMI) of anterolateral wall ([place])   . Angio-edema   . Nonsustained ventricular tachycardia ([place])   . Acute respiratory failure ([place])   . Airway obstruction   . Macroglossia   . Non-STEMI (non-ST elevated myocardial infarction) ([place])   . Anaphylaxis 09/30/2016    Past Surgical History:  Procedure Laterality Date  . ARTHROPLASTY Right    ankle  . BUNIONECTOMY    . CARDIAC CATHETERIZATION N/A 07/02/2015   Procedure: Right/Left Heart Cath and Coronary Angiography;  Surgeon: Teodoro Spray, MD;  Location: [place] [facility];  Service: Cardiovascular;  Laterality: N/A;  . CORONARY/GRAFT ACUTE MI REVASCULARIZATION N/A 10/02/2016   Procedure: Coronary/Graft Acute MI Revascularization;  Surgeon: Wellington Hampshire, MD;  Location: [place] [facility];  Service: Cardiovascular;  Laterality: N/A;  . JOINT REPLACEMENT    . ostatectomy          Home Medications    Prior to Admission medications   Medication Sig Start Date End Date Taking? Authorizing Provider  albuterol (VENTOLIN HFA) 108 (90 BASE) MCG/ACT inhaler Inhale into the lungs every 4 (four) hours as needed.  01/30/14  Yes [provider]  amiodarone (PACERONE) 200 MG tablet  10/06/16  Yes [provider]  aspirin 81 MG chewable tablet Chew 1 tablet (81 mg total) by mouth daily. 10/06/16  Yes Demetrios Loll, MD  brimonidine (ALPHAGAN P) 0.1 % SOLN Alphagan P 0.1 % eye drops   Yes [provider]  clopidogrel (PLAVIX) 75 MG tablet Take 1 tablet (75 mg total) by mouth daily with breakfast. 10/07/16  Yes Demetrios Loll, MD  dorzolamide-timolol (COSOPT) 22.3-6.8 MG/ML ophthalmic solution  08/20/16  Yes [provider]  EPINEPHrine 0.3 mg/0.3 mL IJ SOAJ injection Inject 0.3 mLs (0.3 mg total) into the muscle once as needed. 10/06/16  Yes Demetrios Loll, MD  furosemide (LASIX) 20 MG tablet Take 1 tablet (20 mg total) by mouth daily. 10/06/16  Yes Demetrios Loll, MD  latanoprost (XALATAN) 0.005 % ophthalmic solution Place 1 drop into both eyes at bedtime.  01/30/13  Yes [provider]  mometasone-formoterol (DULERA) 200-5 MCG/ACT AERO Dulera 200 mcg-5 mcg/actuation HFA aerosol inhaler  Yes [provider]  MULTIPLE VITAMIN PO Take by mouth.   Yes [provider]  pantoprazole (PROTONIX) 40 MG tablet Take 40 mg by mouth daily. Reported on 07/02/2015 05/19/12  Yes [provider]  pramipexole (MIRAPEX) 0.25 MG tablet Take 0.25-0.5 mg by mouth 2 (two) times daily.    Yes [provider]  traMADol (ULTRAM) 50 MG tablet Take 1 tablet (50 mg total) by mouth every 8 (eight) hours as needed. 10/07/17  Yes Cook, Jayce G, DO  atorvastatin (LIPITOR) 40 MG tablet Take 1 tablet (40 mg total) by mouth daily at 6 PM. Patient not taking: Reported on 01/20/2018 10/06/16   Demetrios Loll, MD  baclofen (LIORESAL) 10 MG tablet Take 0.5-1  tablets (5-10 mg total) by mouth 3 (three) times daily as needed for muscle spasms. 10/07/17   Coral Spikes, DO  metoprolol succinate (TOPROL-XL) 25 MG 24 hr tablet metoprolol succinate ER 25 mg tablet,extended release 24 hr  Take 1 tablet every day by oral route.    [provider]  predniSONE (DELTASONE) 20 MG tablet Take 1 tablet (20 mg total) by mouth daily. 02/14/18   Norval Gable, MD  tiotropium (SPIRIVA HANDIHALER) 18 MCG inhalation capsule Place 18 mcg into inhaler and inhale daily.  01/30/14   [provider]    Family History Family History  Problem Relation Age of Onset  . Stroke Mother   . Atrial fibrillation Mother   . Stroke Father   . Atrial fibrillation Father     Social History Social History   Tobacco Use  . Smoking status: Never Smoker  . Smokeless tobacco: Never Used  Substance Use Topics  . Alcohol use: Yes    Alcohol/week: 0.0 standard drinks    Comment: rare  . Drug use: No     Allergies   Galactose; Gabapentin; and Celecoxib   Review of Systems Review of Systems   Physical Exam Triage Vital Signs ED Triage Vitals  Enc Vitals Group     BP 02/14/18 1136 129/87     Pulse Rate 02/14/18 1136 71     Resp 02/14/18 1136 16     Temp 02/14/18 1136 98.2 F (36.8 C)     Temp Source 02/14/18 1136 Oral     SpO2 02/14/18 1136 95 %     Weight 02/14/18 1137 176 lb (79.8 kg)     Height 02/14/18 1137 5\' 9"  (1.753 m)     Head Circumference --      Peak Flow --      Pain Score 02/14/18 1136 4     Pain Loc --      Pain Edu? --      Excl. in [place]? --    No data found.  Updated Vital Signs BP 129/87 (BP Location: Left Arm)   Pulse 71   Temp 98.2 F (36.8 C) (Oral)   Resp 16   Ht 5\' 9"  (1.753 m)   Wt 79.8 kg   SpO2 95%   BMI 25.99 kg/m   Visual Acuity Right Eye Distance:   Left Eye Distance:   Bilateral Distance:    Right Eye Near:   Left Eye Near:    Bilateral Near:     Physical Exam  Constitutional: He appears  well-developed and well-nourished. No distress.  Musculoskeletal:       Right knee: He exhibits swelling (mild). He exhibits normal range of motion, no effusion, no ecchymosis, no deformity, no laceration, no erythema, normal alignment,  no LCL laxity, normal patellar mobility, no bony tenderness, normal meniscus and no MCL laxity. Tenderness (diffuse) found. No medial joint line, no lateral joint line, no MCL, no LCL and no patellar tendon tenderness noted.  Skin: He is not diaphoretic.  Nursing note and vitals reviewed.    UC Treatments / Results  Labs (all labs ordered are listed, but only abnormal results are displayed) Labs Reviewed - No data to display  EKG None  Radiology Dg Knee Complete 4 Views Right  Result Date: 02/14/2018 CLINICAL DATA:  Status post fall yesterday injuring the right knee. Generalized pain and focal discomfort in the suprapatellar region. EXAM: RIGHT KNEE - COMPLETE 4+ VIEW COMPARISON:  None. FINDINGS: The bones are subjectively osteopenic. The joint spaces are reasonably well-maintained. There is beaking of the tibial spines. Spurs arise from the articular margins of the patella. There is a small suprapatellar effusion. There is no acute fracture nor dislocation. IMPRESSION: Mild tricompartmental osteoarthritic change of the right knee. Small suprapatellar effusion. No acute bony abnormality. Electronically Signed   By: David  Martinique M.D.   On: 02/14/2018 12:19    Procedures Procedures (including critical care time)  Medications Ordered in UC Medications - No data to display  Initial Impression / Assessment and Plan / UC Course  I have reviewed the triage vital signs and the nursing notes.  Pertinent labs & imaging results that were available during my care of the patient were reviewed by me and considered in my medical decision making (see chart for details).      Final Clinical Impressions(s) / UC Diagnoses   Final diagnoses:  Acute pain of right  knee  Fall, initial encounter     Discharge Instructions     Rest, ice, elevation Continue home medication for pain    ED Prescriptions    Medication Sig Dispense Auth. Provider   predniSONE (DELTASONE) 20 MG tablet Take 1 tablet (20 mg total) by mouth daily. 5 tablet Norval Gable, MD      1. x-ray results and diagnosis reviewed with patient 2. rx as per orders above; reviewed possible side effects, interactions, risks and benefits  3. Recommend supportive treatment as above 4. Follow-up prn if symptoms worsen or don't improve   Controlled Substance Prescriptions [place] Controlled Substance Registry consulted? Not Applicable   Norval Gable, MD 02/14/18 1250

## 2018-02-14 NOTE — Discharge Instructions (Signed)
Rest, ice, elevation Continue home medication for pain

## 2018-02-17 ENCOUNTER — Ambulatory Visit: Payer: Medicare Other

## 2018-02-17 ENCOUNTER — Encounter: Payer: Self-pay | Admitting: Physical Therapy

## 2018-02-17 DIAGNOSIS — R2689 Other abnormalities of gait and mobility: Secondary | ICD-10-CM

## 2018-02-17 DIAGNOSIS — M6281 Muscle weakness (generalized): Secondary | ICD-10-CM | POA: Diagnosis not present

## 2018-02-17 NOTE — Therapy (Addendum)
[place] MAIN [facility] SERVICES [address] [place], [place], [address] Phone: [phone]   Fax:  [phone]  Physical Therapy Treatment  Patient Details  Name: Donald Riley MRN: [id number] Date of Birth: [birth year] Referring Provider (PT): Hortencia Pilar MD   Encounter Date: 02/17/2018  PT End of Session - 02/17/18 1302    Visit Number  6    Number of Visits  17    Date for PT Re-Evaluation  03/17/18    Authorization Type  5/10 progress note    PT Start Time  1302    PT Stop Time  1346    PT Time Calculation (min)  44 min    Equipment Utilized During Treatment  Gait belt    Activity Tolerance  Patient tolerated treatment well    Behavior During Therapy  WFL for tasks assessed/performed       Past Medical History:  Diagnosis Date  . Allergy to alpha-gal   . Arthritis   . Asthma   . Atrial fibrillation ([place])   . Cancer ([place]) 2002  . GERD (gastroesophageal reflux disease)   . Glaucoma   . Gout   . Heart murmur   . Hypertension   . Neuropathy   . Osteoporosis   . Sleep apnea     Past Surgical History:  Procedure Laterality Date  . ARTHROPLASTY Right    ankle  . BUNIONECTOMY    . CARDIAC CATHETERIZATION N/A 07/02/2015   Procedure: Right/Left Heart Cath and Coronary Angiography;  Surgeon: Teodoro Spray, MD;  Location: [place] [facility];  Service: Cardiovascular;  Laterality: N/A;  . CORONARY/GRAFT ACUTE MI REVASCULARIZATION N/A 10/02/2016   Procedure: Coronary/Graft Acute MI Revascularization;  Surgeon: Wellington Hampshire, MD;  Location: [place] [facility];  Service: Cardiovascular;  Laterality: N/A;  . JOINT REPLACEMENT    . ostatectomy      There were no vitals filed for this visit.  Subjective Assessment - 02/17/18 1306    Subjective  Patient reports that he fell on 02/13/18. Stated that he was checking the air in his tires and felt like he was going to lose his balance, grabbed the car down and sat really hard.  Waited until Monday morning to go to ED stated he went because it was very painful.     Pertinent History  84 yo Male reports increased weakness in BLE over last 6 months. Patient has a history of chronic low back pain with radicular symptoms. He is s/p L1/L2, L5/S1 non-surgical fusion with degenerative disc disease. In addition CT of 2017 shows a moderate-severe central spinal stenosis and severe facet disease. He denies any surgery for lumbar spine; He reports spine center reports that he is not a surgical candidate as the spine is too severe.  He has been to a chiropractor in the past with no relief. Patient also has a diagnosis of polyneuropathy which could be contributing to his symptoms. He is ambulating with a SPC. He will be following up with spine surgeon and possibly get steroid injection. He has had steriod injections in the past with mixed results.     How long can you sit comfortably?  NA    How long can you stand comfortably?  20-30 min, he reports increased numbness in legs after a few minutes    How long can you walk comfortably?  He walks with cane/walker, takes longer time, able to walk >500 feet; has a harder time walking downhill  Diagnostic tests  MRI shows multiple lumbar segment stenosis/facet degeneration    Patient Stated Goals  Improve walking, increase strength in legs, feeling more steady;     Currently in Pain?  No/denies       TREATMENT   Ther-ex   -Double knee trunk rotation x10 reps x10 sec holds to each side -Single knee to chest x20 sec holds x2 reps each leg -Bridges x15 reps, VCs for proper technique and avoiding painful ROM  Leg press: BLE plate 90# x 15, 099# x 15; with min VCs  For TKE.   Green tband around BLE: Hip flexion march x 15 bilaterally; Hip abduction/ER x 15 bilaterally Hip adduction squeeze with manual resistance x 15;   Sit to stand without UE support from elevated mat table  2x 10, slightly lower mat table for second set of 10, close  supervision Red tband BUE rows x15 with min VCs for correct posture and to improve scapular retraction  In parallel bars: FT EO without UE 3x20s holds, close supervision Modified tandem without UE support 2x20s alternating feet forward with CGA step forward without UE support 3x20s alternating feet forward with CGA Patient needed min verbal cues for upright posture and for balance strategies.  Patient response to treatment: Patient without complaints at end of session, good tolerance to progression of program. No significant LOB noted.      PT Education - 02/17/18 1301    Education Details  exercise technique/form    Person(s) Educated  Patient    Methods  Explanation;Demonstration;Verbal cues    Comprehension  Verbalized understanding;Returned demonstration;Verbal cues required       PT Short Term Goals - 02/17/18 1309      PT SHORT TERM GOAL #1   Title  Patient will be adherent to HEP at least 3x a week to improve functional strength and balance for better safety at home.    Baseline  Performing exercises everyday    Time  4    Period  Weeks    Status  Achieved    Target Date  02/17/18      PT SHORT TERM GOAL #2   Title  Patient will be able to transfer sit<>Stand from a regular height chair with 1 UE assist or less, independent to improve ability to get up out of chairs/couch.    Time  4    Period  Weeks    Status  New    Target Date  02/17/18        PT Long Term Goals - 01/20/18 1259      PT LONG TERM GOAL #1   Title  Patient will increase BLE gross strength to 4+/5 as to improve functional strength for independent gait, increased standing tolerance and increased ADL ability.    Time  8    Period  Weeks    Status  New    Target Date  03/17/18      PT LONG TERM GOAL #2   Title  Patient will increase 10 meter walk test to >1.11m/s as to improve gait speed for better community ambulation and to reduce fall risk.    Time  8    Period  Weeks    Status  New     Target Date  03/17/18      PT LONG TERM GOAL #3   Title  Patient (> 40 years old) will complete five times sit to stand test in < 15 seconds indicating an increased LE strength  and improved balance.    Time  8    Period  Weeks    Status  New    Target Date  03/17/18      PT LONG TERM GOAL #4   Title  Patient will be mod I when negotiating a curb with LRAD to exhibit improved gait ability and safety in community.     Time  8    Period  Weeks    Status  New    Target Date  03/17/18            Plan - 02/17/18 1342    Clinical Impression Statement  Patient achieved short term goals in timely manner, reported doing his HEP almost every day. Patient most challenged by sit to stands during session, elevated surface needed to allow decrease in UE support. CGA/close supervision for balance activities today, patient needed minimal verbal cues for postural awareness. The patient would benefit from continued skilled PT to continue to progress program.     Clinical Impairments Affecting Rehab Potential  motivated; concerned about progressive stenosis with increased numbness in legs;     PT Frequency  2x / week    PT Duration  8 weeks    PT Treatment/Interventions  Aquatic Therapy;Cryotherapy;Electrical Stimulation;Moist Heat;Therapeutic exercise;Therapeutic activities;Functional mobility training;Stair training;Gait training;Balance training;Neuromuscular re-education;Patient/family education;Orthotic Fit/Training;Manual techniques;Energy conservation    PT Next Visit Plan  Advance HEP, progress resistance and strength training    PT Home Exercise Plan  seated marching, hamstring curls, and hip abduction with green tband; trunk rotation and single knee to chest in AM and PM, Standing mini squats    Consulted and Agree with Plan of Care  Patient       Patient will benefit from skilled therapeutic intervention in order to improve the following deficits and impairments:  Decreased endurance,  Decreased activity tolerance, Decreased strength, Pain, Decreased balance, Decreased mobility, Difficulty walking, Postural dysfunction, Impaired flexibility, Decreased safety awareness  Visit Diagnosis: Muscle weakness (generalized)  Other abnormalities of gait and mobility     Problem List Patient Active Problem List   Diagnosis Date Noted  . Acute ST elevation myocardial infarction (STEMI) of anterolateral wall ([place])   . Angio-edema   . Nonsustained ventricular tachycardia ([place])   . Acute respiratory failure ([place])   . Airway obstruction   . Macroglossia   . Non-STEMI (non-ST elevated myocardial infarction) ([place])   . Anaphylaxis 09/30/2016    Lieutenant Diego PT, DPT 10:56 AM,02/18/18 [place] MAIN [facility] SERVICES [address] [place], [place], [address] Phone: [phone]   Fax:  [phone]  Name: Donald Riley MRN: [id number] Date of Birth: 1933/07/22

## 2018-02-21 ENCOUNTER — Encounter: Payer: Self-pay | Admitting: Physical Therapy

## 2018-02-21 ENCOUNTER — Ambulatory Visit: Payer: Medicare Other | Admitting: Physical Therapy

## 2018-02-21 DIAGNOSIS — M6281 Muscle weakness (generalized): Secondary | ICD-10-CM

## 2018-02-21 DIAGNOSIS — R2689 Other abnormalities of gait and mobility: Secondary | ICD-10-CM

## 2018-02-21 NOTE — Therapy (Signed)
[place] MAIN [facility] SERVICES [address] [place], [place], [address] Phone: [phone]   Fax:  [phone]  Physical Therapy Treatment  Patient Details  Name: Donald Riley MRN: [id number] Date of Birth: 03/27/1934 Referring Provider (PT): Hortencia Pilar MD   Encounter Date: 02/21/2018  PT End of Session - 02/21/18 1301    Visit Number  7    Number of Visits  17    Date for PT Re-Evaluation  03/17/18    Authorization Type  6/10 progress note    PT Start Time  6962    PT Stop Time  1345    PT Time Calculation (min)  40 min    Equipment Utilized During Treatment  Gait belt    Activity Tolerance  Patient tolerated treatment well    Behavior During Therapy  WFL for tasks assessed/performed       Past Medical History:  Diagnosis Date  . Allergy to alpha-gal   . Arthritis   . Asthma   . Atrial fibrillation ([place])   . Cancer ([place]) 2002  . GERD (gastroesophageal reflux disease)   . Glaucoma   . Gout   . Heart murmur   . Hypertension   . Neuropathy   . Osteoporosis   . Sleep apnea     Past Surgical History:  Procedure Laterality Date  . ARTHROPLASTY Right    ankle  . BUNIONECTOMY    . CARDIAC CATHETERIZATION N/A 07/02/2015   Procedure: Right/Left Heart Cath and Coronary Angiography;  Surgeon: Teodoro Spray, MD;  Location: [place] [facility];  Service: Cardiovascular;  Laterality: N/A;  . CORONARY/GRAFT ACUTE MI REVASCULARIZATION N/A 10/02/2016   Procedure: Coronary/Graft Acute MI Revascularization;  Surgeon: Wellington Hampshire, MD;  Location: [place] [facility];  Service: Cardiovascular;  Laterality: N/A;  . JOINT REPLACEMENT    . ostatectomy      There were no vitals filed for this visit.  Subjective Assessment - 02/21/18 1308    Subjective  Patient states that he is feeling pretty good and the exercises are helping with his back pain. Patient reports no new falls or significant changes since his last session.      Pertinent History  84 yo Male reports increased weakness in BLE over last 6 months. Patient has a history of chronic low back pain with radicular symptoms. He is s/p L1/L2, L5/S1 non-surgical fusion with degenerative disc disease. In addition CT of 2017 shows a moderate-severe central spinal stenosis and severe facet disease. He denies any surgery for lumbar spine; He reports spine center reports that he is not a surgical candidate as the spine is too severe.  He has been to a chiropractor in the past with no relief. Patient also has a diagnosis of polyneuropathy which could be contributing to his symptoms. He is ambulating with a SPC. He will be following up with spine surgeon and possibly get steroid injection. He has had steriod injections in the past with mixed results.     How long can you sit comfortably?  NA    How long can you stand comfortably?  20-30 min, he reports increased numbness in legs after a few minutes    How long can you walk comfortably?  He walks with cane/walker, takes longer time, able to walk >500 feet; has a harder time walking downhill    Diagnostic tests  MRI shows multiple lumbar segment stenosis/facet degeneration    Patient Stated Goals  Improve walking, increase  strength in legs, feeling more steady;     Currently in Pain?  Yes    Pain Score  1     Pain Location  Back    Pain Orientation  Lower    Pain Descriptors / Indicators  Sore    Pain Type  Chronic pain    Pain Onset  More than a month ago    Pain Frequency  Intermittent    Multiple Pain Sites  No       TREATMENT   Ther-ex    -Double knee trunk rotation x10 reps x10 sec holds to each side -Single knee to chest x20 sec holds x2 reps each leg -Bridges x15 reps, VCs for proper technique and avoiding painful ROM   Leg press: BLE plate 80# x 10, 062# 2 x 15; with min VCs  For TKE.   Green tband around BLE: Hip flexion march x 15 bilaterally; Hip abduction/ER x 15 bilaterally Hip adduction squeeze with  bolster x 15 x 5 sec;    Sit to stand without UE support from elevated mat table  2x 10, slightly lower mat table for second set of 10 SBA . VCs for loading the hip joint with a weight shift prior to trying to stand. Patient able to perform with min repetition of VCs and had only one incident of posterior sway, which patient was able to correct with ankle strategy.      PT Short Term Goals - 02/17/18 1309      PT SHORT TERM GOAL #1   Title  Patient will be adherent to HEP at least 3x a week to improve functional strength and balance for better safety at home.    Baseline  Performing exercises everyday    Time  4    Period  Weeks    Status  Achieved    Target Date  02/17/18      PT SHORT TERM GOAL #2   Title  Patient will be able to transfer sit<>Stand from a regular height chair with 1 UE assist or less, independent to improve ability to get up out of chairs/couch.    Time  4    Period  Weeks    Status  New    Target Date  02/17/18        PT Long Term Goals - 01/20/18 1259      PT LONG TERM GOAL #1   Title  Patient will increase BLE gross strength to 4+/5 as to improve functional strength for independent gait, increased standing tolerance and increased ADL ability.    Time  8    Period  Weeks    Status  New    Target Date  03/17/18      PT LONG TERM GOAL #2   Title  Patient will increase 10 meter walk test to >1.75m/s as to improve gait speed for better community ambulation and to reduce fall risk.    Time  8    Period  Weeks    Status  New    Target Date  03/17/18      PT LONG TERM GOAL #3   Title  Patient (> 85 years old) will complete five times sit to stand test in < 15 seconds indicating an increased LE strength and improved balance.    Time  8    Period  Weeks    Status  New    Target Date  03/17/18      PT LONG TERM  GOAL #4   Title  Patient will be mod I when negotiating a curb with LRAD to exhibit improved gait ability and safety in community.     Time  8     Period  Weeks    Status  New    Target Date  03/17/18            Plan - 02/21/18 1336    Clinical Impression Statement  Patient presents to clinic with reports of improved function and compliance with his HEP and was amenable to therapy. Patient continues to demonstrate deficits in strength during BLE strengthening as observed during sit to stand with min VCs and SBA/CGA to ensure safety and proper technique when not using UE. Patient tolerated increased resistance and repetitions in therapeutic exercises well and now requires <25% VCs for TKE on leg press. Patient will continue to benefit from skilled therapeutic intervention to address strength and balance deficits in order to reduce risk of falls and improve overall QOL.    Clinical Impairments Affecting Rehab Potential  motivated; concerned about progressive stenosis with increased numbness in legs;     PT Frequency  2x / week    PT Duration  8 weeks    PT Treatment/Interventions  Aquatic Therapy;Cryotherapy;Electrical Stimulation;Moist Heat;Therapeutic exercise;Therapeutic activities;Functional mobility training;Stair training;Gait training;Balance training;Neuromuscular re-education;Patient/family education;Orthotic Fit/Training;Manual techniques;Energy conservation    PT Next Visit Plan  Advance HEP, progress resistance and strength training    PT Home Exercise Plan  seated marching, hamstring curls, and hip abduction with green tband; trunk rotation and single knee to chest in AM and PM, Standing mini squats    Consulted and Agree with Plan of Care  Patient       Patient will benefit from skilled therapeutic intervention in order to improve the following deficits and impairments:  Decreased endurance, Decreased activity tolerance, Decreased strength, Pain, Decreased balance, Decreased mobility, Difficulty walking, Postural dysfunction, Impaired flexibility, Decreased safety awareness  Visit Diagnosis: Muscle weakness  (generalized)  Other abnormalities of gait and mobility     Problem List Patient Active Problem List   Diagnosis Date Noted  . Acute ST elevation myocardial infarction (STEMI) of anterolateral wall ([place])   . Angio-edema   . Nonsustained ventricular tachycardia ([place])   . Acute respiratory failure ([place])   . Airway obstruction   . Macroglossia   . Non-STEMI (non-ST elevated myocardial infarction) ([place])   . Anaphylaxis 09/30/2016   Myles Gip PT, DPT [phone] 02/21/2018, 1:51 PM  [place] MAIN [facility] SERVICES [address] [place], [place], [address] Phone: [phone]   Fax:  [phone]  Name: RIAD WAGLEY MRN: [id number] Date of Birth: 02-26-1934

## 2018-02-23 ENCOUNTER — Ambulatory Visit: Payer: Medicare Other

## 2018-02-23 DIAGNOSIS — M6281 Muscle weakness (generalized): Secondary | ICD-10-CM | POA: Diagnosis not present

## 2018-02-23 DIAGNOSIS — R2689 Other abnormalities of gait and mobility: Secondary | ICD-10-CM

## 2018-02-23 NOTE — Therapy (Signed)
[place] MAIN [facility] SERVICES [address] [place], [place], [address] Phone: [phone]   Fax:  [phone]  Physical Therapy Treatment  Patient Details  Name: Donald Riley MRN: [id number] Date of Birth: 04-Sep-1933 Referring Provider (PT): Hortencia Pilar MD   Encounter Date: 02/23/2018  PT End of Session - 02/23/18 1305    Visit Number  8    Number of Visits  17    Date for PT Re-Evaluation  03/17/18    Authorization Type  7/10 progress note    PT Start Time  1300    PT Stop Time  1344    PT Time Calculation (min)  44 min    Equipment Utilized During Treatment  Gait belt    Activity Tolerance  Patient tolerated treatment well    Behavior During Therapy  WFL for tasks assessed/performed       Past Medical History:  Diagnosis Date  . Allergy to alpha-gal   . Arthritis   . Asthma   . Atrial fibrillation ([place])   . Cancer ([place]) 2002  . GERD (gastroesophageal reflux disease)   . Glaucoma   . Gout   . Heart murmur   . Hypertension   . Neuropathy   . Osteoporosis   . Sleep apnea     Past Surgical History:  Procedure Laterality Date  . ARTHROPLASTY Right    ankle  . BUNIONECTOMY    . CARDIAC CATHETERIZATION N/A 07/02/2015   Procedure: Right/Left Heart Cath and Coronary Angiography;  Surgeon: Teodoro Spray, MD;  Location: [place] [facility];  Service: Cardiovascular;  Laterality: N/A;  . CORONARY/GRAFT ACUTE MI REVASCULARIZATION N/A 10/02/2016   Procedure: Coronary/Graft Acute MI Revascularization;  Surgeon: Wellington Hampshire, MD;  Location: [place] [facility];  Service: Cardiovascular;  Laterality: N/A;  . JOINT REPLACEMENT    . ostatectomy      There were no vitals filed for this visit.  Subjective Assessment - 02/23/18 1303    Subjective  Patient reports that last week he had a pretty good week in regards to pain. was taking prednisone for his knees. Reports more soreness this week.    Pertinent History  84 yo Male  reports increased weakness in BLE over last 6 months. Patient has a history of chronic low back pain with radicular symptoms. He is s/p L1/L2, L5/S1 non-surgical fusion with degenerative disc disease. In addition CT of 2017 shows a moderate-severe central spinal stenosis and severe facet disease. He denies any surgery for lumbar spine; He reports spine center reports that he is not a surgical candidate as the spine is too severe.  He has been to a chiropractor in the past with no relief. Patient also has a diagnosis of polyneuropathy which could be contributing to his symptoms. He is ambulating with a SPC. He will be following up with spine surgeon and possibly get steroid injection. He has had steriod injections in the past with mixed results.     How long can you sit comfortably?  NA    How long can you stand comfortably?  20-30 min, he reports increased numbness in legs after a few minutes    How long can you walk comfortably?  He walks with cane/walker, takes longer time, able to walk >500 feet; has a harder time walking downhill    Diagnostic tests  MRI shows multiple lumbar segment stenosis/facet degeneration    Patient Stated Goals  Improve walking, increase strength in legs, feeling  more steady;     Currently in Pain?  Yes    Pain Score  1     Pain Location  Back    Pain Orientation  Lower    Pain Descriptors / Indicators  Sore       Ther-ex    -Double knee trunk rotation x10 reps x10 sec holds to each side -Single knee to chest x20 sec holds x2 reps each leg -Bridges x15 reps, VCs for proper technique and avoiding painful ROM -hooklying hip abduction with BTB 2x10   Leg press: BLE plate 75# x 15, 092#  x 20; with min VCs  For TKE.   BTB tband around BLE seated: Hip flexion march 2x10 bilaterally; Hip abduction/ER 2x10 bilaterally Hip adduction squeeze with bolster 2x10x5s holds;  Seated LAQ 2x10 Seated ankle DF and PF 2x10 bilaterally   Sit to stand without UE support from  slightly elevated mat table  x15, SBA. VCs for loading the hip joint with a weight shift prior to trying to stand "nose over toes". Patient able to perform with min repetition of VCs and had 2 instances of postural sway, needed UE support to correct.     PT Education - 02/23/18 1304    Education Details  exercise technique/form, weight shift for sit to stand transfer    Person(s) Educated  Patient    Methods  Explanation;Demonstration;Verbal cues    Comprehension  Verbalized understanding;Returned demonstration;Verbal cues required       PT Short Term Goals - 02/17/18 1309      PT SHORT TERM GOAL #1   Title  Patient will be adherent to HEP at least 3x a week to improve functional strength and balance for better safety at home.    Baseline  Performing exercises everyday    Time  4    Period  Weeks    Status  Achieved    Target Date  02/17/18      PT SHORT TERM GOAL #2   Title  Patient will be able to transfer sit<>Stand from a regular height chair with 1 UE assist or less, independent to improve ability to get up out of chairs/couch.    Time  4    Period  Weeks    Status  New    Target Date  02/17/18        PT Long Term Goals - 01/20/18 1259      PT LONG TERM GOAL #1   Title  Patient will increase BLE gross strength to 4+/5 as to improve functional strength for independent gait, increased standing tolerance and increased ADL ability.    Time  8    Period  Weeks    Status  New    Target Date  03/17/18      PT LONG TERM GOAL #2   Title  Patient will increase 10 meter walk test to >1.60m/s as to improve gait speed for better community ambulation and to reduce fall risk.    Time  8    Period  Weeks    Status  New    Target Date  03/17/18      PT LONG TERM GOAL #3   Title  Patient (> 45 years old) will complete five times sit to stand test in < 15 seconds indicating an increased LE strength and improved balance.    Time  8    Period  Weeks    Status  New    Target Date  03/17/18      PT LONG TERM GOAL #4   Title  Patient will be mod I when negotiating a curb with LRAD to exhibit improved gait ability and safety in community.     Time  8    Period  Weeks    Status  New    Target Date  03/17/18            Plan - 02/23/18 1337    Clinical Impression Statement  Patient continues to progress well with strengthening program. Still has deficits in strength/endurance that affect functional activities such as walking/transfers. Mod verbal cues to promote proper exercise/technique. Pt mentioned increased soreness post session, leg press modified accordingly and pt instructed to assess response post session over the next couple of days.    Rehab Potential  Fair    Clinical Impairments Affecting Rehab Potential  motivated; concerned about progressive stenosis with increased numbness in legs;     PT Frequency  2x / week    PT Duration  8 weeks    PT Treatment/Interventions  Aquatic Therapy;Cryotherapy;Electrical Stimulation;Moist Heat;Therapeutic exercise;Therapeutic activities;Functional mobility training;Stair training;Gait training;Balance training;Neuromuscular re-education;Patient/family education;Orthotic Fit/Training;Manual techniques;Energy conservation    PT Next Visit Plan  Advance HEP, progress resistance and strength training    PT Home Exercise Plan  seated marching, hamstring curls, and hip abduction with green tband; trunk rotation and single knee to chest in AM and PM, Standing mini squats    Consulted and Agree with Plan of Care  Patient       Patient will benefit from skilled therapeutic intervention in order to improve the following deficits and impairments:  Decreased endurance, Decreased activity tolerance, Decreased strength, Pain, Decreased balance, Decreased mobility, Difficulty walking, Postural dysfunction, Impaired flexibility, Decreased safety awareness  Visit Diagnosis: Muscle weakness (generalized)  Other abnormalities of gait and  mobility     Problem List Patient Active Problem List   Diagnosis Date Noted  . Acute ST elevation myocardial infarction (STEMI) of anterolateral wall ([place])   . Angio-edema   . Nonsustained ventricular tachycardia ([place])   . Acute respiratory failure ([employer])   . Airway obstruction   . Macroglossia   . Non-STEMI (non-ST elevated myocardial infarction) ([place])   . Anaphylaxis 09/30/2016    Lieutenant Diego PT, DPT 1:44 PM,02/23/18 [place] MAIN [facility] SERVICES [address] [place], [place], [address] Phone: [phone]   Fax:  [phone]  Name: Donald Riley MRN: [id number] Date of Birth: 1934-02-18

## 2018-02-28 ENCOUNTER — Ambulatory Visit: Payer: Medicare Other | Attending: Family Medicine | Admitting: Physical Therapy

## 2018-02-28 ENCOUNTER — Encounter: Payer: Self-pay | Admitting: Physical Therapy

## 2018-02-28 DIAGNOSIS — M6281 Muscle weakness (generalized): Secondary | ICD-10-CM

## 2018-02-28 DIAGNOSIS — R2689 Other abnormalities of gait and mobility: Secondary | ICD-10-CM | POA: Diagnosis present

## 2018-02-28 NOTE — Patient Instructions (Signed)
Balance, Proprioception: Hip Abduction With Tubing   With tubing attached to both ankles, Standing holding onto counter, kick one leg out to side and then Return.  Repeat _10___ times  On each side.  Do ___2_ sessions per day.  http://cc.exer.us/20    Balance, Proprioception: Hip Extension With Tubing   With tubing tied around both legs, holding onto kitchen counter, swing leg back. Return. Repeat _10___ times . Do __2__ sessions per day.  http://cc.exer.us/19    http://cc.exer.us/18   Copyright  VHI. All rights reserved.  Band Walk: Side Stepping   Tie band around legs,AROUND ANKLES. Step _10__ feet to one side, then step back to start. Repeat _2-3__ feet per session. Note: Small towel between band and skin eases rubbing.  http://plyo.exer.[email or web address]

## 2018-02-28 NOTE — Therapy (Signed)
[place] MAIN [facility] SERVICES [place], [place], [address] Phone: [phone]   Fax:  [phone]  Physical Therapy Treatment  Patient Details  Name: Donald Riley MRN: [id number] Date of Birth: 05-09-1933 Referring Provider (PT): Hortencia Pilar MD   Encounter Date: 02/28/2018  PT End of Session - 02/28/18 1309    Visit Number  9    Number of Visits  17    Date for PT Re-Evaluation  03/17/18    Authorization Type  9/10 progress note    PT Start Time  1302    PT Stop Time  1345    PT Time Calculation (min)  43 min    Equipment Utilized During Treatment  Gait belt    Activity Tolerance  Patient tolerated treatment well    Behavior During Therapy  WFL for tasks assessed/performed       Past Medical History:  Diagnosis Date  . Allergy to alpha-gal   . Arthritis   . Asthma   . Atrial fibrillation ([place])   . Cancer ([place]) 2002  . GERD (gastroesophageal reflux disease)   . Glaucoma   . Gout   . Heart murmur   . Hypertension   . Neuropathy   . Osteoporosis   . Sleep apnea     Past Surgical History:  Procedure Laterality Date  . ARTHROPLASTY Right    ankle  . BUNIONECTOMY    . CARDIAC CATHETERIZATION N/A 07/02/2015   Procedure: Right/Left Heart Cath and Coronary Angiography;  Surgeon: Teodoro Spray, MD;  Location: [place] [facility];  Service: Cardiovascular;  Laterality: N/A;  . CORONARY/GRAFT ACUTE MI REVASCULARIZATION N/A 10/02/2016   Procedure: Coronary/Graft Acute MI Revascularization;  Surgeon: Wellington Hampshire, MD;  Location: [place] [facility];  Service: Cardiovascular;  Laterality: N/A;  . JOINT REPLACEMENT    . ostatectomy      There were no vitals filed for this visit.  Subjective Assessment - 02/28/18 1308    Subjective  Patient reports doing well today; Denies any pain currently; Reports adherence to HEP;     Pertinent History  84 yo Male reports increased weakness in BLE over last 6 months. Patient  has a history of chronic low back pain with radicular symptoms. He is s/p L1/L2, L5/S1 non-surgical fusion with degenerative disc disease. In addition CT of 2017 shows a moderate-severe central spinal stenosis and severe facet disease. He denies any surgery for lumbar spine; He reports spine center reports that he is not a surgical candidate as the spine is too severe.  He has been to a chiropractor in the past with no relief. Patient also has a diagnosis of polyneuropathy which could be contributing to his symptoms. He is ambulating with a SPC. He will be following up with spine surgeon and possibly get steroid injection. He has had steriod injections in the past with mixed results.     How long can you sit comfortably?  NA    How long can you stand comfortably?  20-30 min, he reports increased numbness in legs after a few minutes    How long can you walk comfortably?  He walks with cane/walker, takes longer time, able to walk >500 feet; has a harder time walking downhill    Diagnostic tests  MRI shows multiple lumbar segment stenosis/facet degeneration    Patient Stated Goals  Improve walking, increase strength in legs, feeling more steady;     Currently in Pain?  No/denies  Multiple Pain Sites  No        Ther-ex  Leg press: BLE plate90# 2x12 BLE heel raises on leg press, plate 90# 2G40, Patient required min VCS for correct positioning to improve correct muscle activation and strengthening;  Progressed strengthening to standing: Standing with red tband around BLE: -hip abduction x10 bilaterally; -hamstring curl x10 bilaterally; -Side stepping x10 feet each direction x2 laps Patient required min-moderate verbal/tactile cues for correct exercise technique.    Sit to stand without UE support from slightly elevated mat table with foam pad under feet x5 reps, x3 sets with progressively lowering mat table to challenge LE control. VCs for loading the hip joint with a weight shift prior to  trying to stand "nose over toes". Often uses momentum to improve sit to stand ability;    Standing in parallel bars:  Standing on 1/2 foam: (Flat side up) -heel/toe rocks with feet apart heel/toe rocks x10 with rail assist for safety -feet apart,alternate UE lift x5 reps bilaterally, progressing to BUE arm raise x5 reps Required min VCS to improve weight shift and erect posture for better stance control;   Stepping over orange hurdle: Forward/backward with 2-1 rail assist x5 reps  Side step with 2-1 rail assist x5 reps each direction Required mod VCs to increase hip flexion and increase step length for better foot clearance                 PT Education - 02/28/18 1309    Education Details  exercise technique/positioning; HEP reinforced    Person(s) Educated  Patient    Methods  Explanation;Demonstration;Verbal cues    Comprehension  Verbalized understanding;Returned demonstration;Verbal cues required;Need further instruction       PT Short Term Goals - 02/17/18 1309      PT SHORT TERM GOAL #1   Title  Patient will be adherent to HEP at least 3x a week to improve functional strength and balance for better safety at home.    Baseline  Performing exercises everyday    Time  4    Period  Weeks    Status  Achieved    Target Date  02/17/18      PT SHORT TERM GOAL #2   Title  Patient will be able to transfer sit<>Stand from a regular height chair with 1 UE assist or less, independent to improve ability to get up out of chairs/couch.    Time  4    Period  Weeks    Status  New    Target Date  02/17/18        PT Long Term Goals - 01/20/18 1259      PT LONG TERM GOAL #1   Title  Patient will increase BLE gross strength to 4+/5 as to improve functional strength for independent gait, increased standing tolerance and increased ADL ability.    Time  8    Period  Weeks    Status  New    Target Date  03/17/18      PT LONG TERM GOAL #2   Title  Patient will increase  10 meter walk test to >1.90m/s as to improve gait speed for better community ambulation and to reduce fall risk.    Time  8    Period  Weeks    Status  New    Target Date  03/17/18      PT LONG TERM GOAL #3   Title  Patient (> 23 years old) will complete five  times sit to stand test in < 15 seconds indicating an increased LE strength and improved balance.    Time  8    Period  Weeks    Status  New    Target Date  03/17/18      PT LONG TERM GOAL #4   Title  Patient will be mod I when negotiating a curb with LRAD to exhibit improved gait ability and safety in community.     Time  8    Period  Weeks    Status  New    Target Date  03/17/18            Plan - 02/28/18 1324    Clinical Impression Statement  Patient instructed in advanced LE strengthening, progressing to increased standing exercise. Patient monitored throughout session to avoid progression of numbness in each LE. Patient does require min VCs for correct positioning and exercise technique. He denies any increase in symptoms. Advanced HEP for better strengthening with instruction on best way to do exercise. Provided written handout for better adherence. Patient would benefit from additional skilled PT intervention to improve strength, balance and gait safety;     Rehab Potential  Fair    Clinical Impairments Affecting Rehab Potential  motivated; concerned about progressive stenosis with increased numbness in legs;     PT Frequency  2x / week    PT Duration  8 weeks    PT Treatment/Interventions  Aquatic Therapy;Cryotherapy;Electrical Stimulation;Moist Heat;Therapeutic exercise;Therapeutic activities;Functional mobility training;Stair training;Gait training;Balance training;Neuromuscular re-education;Patient/family education;Orthotic Fit/Training;Manual techniques;Energy conservation    PT Next Visit Plan  Advance HEP, progress resistance and strength training    PT Home Exercise Plan  seated marching, hamstring curls, and hip  abduction with green tband; trunk rotation and single knee to chest in AM and PM, Standing mini squats    Consulted and Agree with Plan of Care  Patient       Patient will benefit from skilled therapeutic intervention in order to improve the following deficits and impairments:  Decreased endurance, Decreased activity tolerance, Decreased strength, Pain, Decreased balance, Decreased mobility, Difficulty walking, Postural dysfunction, Impaired flexibility, Decreased safety awareness  Visit Diagnosis: Muscle weakness (generalized)  Other abnormalities of gait and mobility     Problem List Patient Active Problem List   Diagnosis Date Noted  . Acute ST elevation myocardial infarction (STEMI) of anterolateral wall ([place])   . Angio-edema   . Nonsustained ventricular tachycardia ([place])   . Acute respiratory failure ([place])   . Airway obstruction   . Macroglossia   . Non-STEMI (non-ST elevated myocardial infarction) ([place])   . Anaphylaxis 09/30/2016    , PT, DPT 02/28/2018, 2:38 PM  [place] MAIN [facility] SERVICES [address] [place], [place], [address] Phone: [phone]   Fax:  [phone]  Name: Donald Riley MRN: [id number] Date of Birth: 10-30-1933

## 2018-03-02 ENCOUNTER — Encounter: Payer: Self-pay | Admitting: Physical Therapy

## 2018-03-02 ENCOUNTER — Ambulatory Visit: Payer: Medicare Other | Admitting: Physical Therapy

## 2018-03-02 DIAGNOSIS — R2689 Other abnormalities of gait and mobility: Secondary | ICD-10-CM

## 2018-03-02 DIAGNOSIS — M6281 Muscle weakness (generalized): Secondary | ICD-10-CM

## 2018-03-02 NOTE — Therapy (Addendum)
[place] MAIN [facility] SERVICES [address] [place], [place], [address] Phone: [phone]   Fax:  [phone]  Physical Therapy Treatment Physical Therapy Progress Note   Dates of reporting period  01/20/18   to  03/02/18   Patient Details  Name: Donald Riley MRN: [id number] Date of Birth: 1933/11/18 Referring Provider (PT): Hortencia Pilar MD   Encounter Date: 03/02/2018  PT End of Session - 03/02/18 1301    Visit Number  10    Number of Visits  17    Date for PT Re-Evaluation  03/17/18    Authorization Type  10/10 progress note    PT Start Time  1302    PT Stop Time  1345    PT Time Calculation (min)  43 min    Equipment Utilized During Treatment  Gait belt    Activity Tolerance  Patient tolerated treatment well    Behavior During Therapy  WFL for tasks assessed/performed       Past Medical History:  Diagnosis Date  . Allergy to alpha-gal   . Arthritis   . Asthma   . Atrial fibrillation ([place])   . Cancer ([place]) 2002  . GERD (gastroesophageal reflux disease)   . Glaucoma   . Gout   . Heart murmur   . Hypertension   . Neuropathy   . Osteoporosis   . Sleep apnea     Past Surgical History:  Procedure Laterality Date  . ARTHROPLASTY Right    ankle  . BUNIONECTOMY    . CARDIAC CATHETERIZATION N/A 07/02/2015   Procedure: Right/Left Heart Cath and Coronary Angiography;  Surgeon: Teodoro Spray, MD;  Location: [place] [facility];  Service: Cardiovascular;  Laterality: N/A;  . CORONARY/GRAFT ACUTE MI REVASCULARIZATION N/A 10/02/2016   Procedure: Coronary/Graft Acute MI Revascularization;  Surgeon: Wellington Hampshire, MD;  Location: [place] [facility];  Service: Cardiovascular;  Laterality: N/A;  . JOINT REPLACEMENT    . ostatectomy      There were no vitals filed for this visit.  Subjective Assessment - 03/02/18 1323    Subjective  Patient reports doing well; denies any pain; Reports that he has been adherent to HEP; patient  presents to therapy with [facility] and reports that he has been using that more. He also reports improvement in sit<>Stand ability at home.     Pertinent History  84 yo Male reports increased weakness in BLE over last 6 months. Patient has a history of chronic low back pain with radicular symptoms. He is s/p L1/L2, L5/S1 non-surgical fusion with degenerative disc disease. In addition CT of 2017 shows a moderate-severe central spinal stenosis and severe facet disease. He denies any surgery for lumbar spine; He reports spine center reports that he is not a surgical candidate as the spine is too severe.  He has been to a chiropractor in the past with no relief. Patient also has a diagnosis of polyneuropathy which could be contributing to his symptoms. He is ambulating with a SPC. He will be following up with spine surgeon and possibly get steroid injection. He has had steriod injections in the past with mixed results.     How long can you sit comfortably?  NA    How long can you stand comfortably?  20-30 min, he reports increased numbness in legs after a few minutes    How long can you walk comfortably?  He walks with cane/walker, takes longer time, able to walk >500 feet; has  a harder time walking downhill    Diagnostic tests  MRI shows multiple lumbar segment stenosis/facet degeneration    Patient Stated Goals  Improve walking, increase strength in legs, feeling more steady;     Currently in Pain?  No/denies    Multiple Pain Sites  No         [facility] PT Assessment - 03/02/18 0001      Strength   Right Hip Flexion  4-/5    Right Hip ABduction  3+/5    Right Hip ADduction  3-/5    Left Hip Flexion  4/5    Left Hip ABduction  4+/5    Left Hip ADduction  4/5    Right Knee Flexion  4-/5    Right Knee Extension  4/5    Left Knee Flexion  4/5    Left Knee Extension  4/5    Right Ankle Dorsiflexion  3/5    Left Ankle Dorsiflexion  3/5      Standardized Balance Assessment   Five times sit to stand  comments   23 sec with 1 HHA, >15 sec indicates increased risk for falls; patients height works against his ability to get up out of regular height chair    10 Meter Walk  0.52 m/s with SPC, limited home ambulator, increased risk for falls; improved from 0.4 m/s with SPC;        Instructed patient in outcome measures to address goals; see above;    Ther-ex  Leg press: BLE plate105#, 2x12 Patient required min VCS for correct positioning to improve correct muscle activation and strengthening;  Progressed strengthening to standing: Hip flexion red tband x15 bilaterally Standing with red tband around BLE: -Side stepping x10 feet each direction x2 laps -Diagonal stepping forward/backward with 1 rail assist x10 feet x2 laps Patient required min-moderate verbal/tactile cues for correct exercise technique.  Standing on airex pad: -Heel/toe raises x15 reps with rail assist for balance; cues to keep knees straight for better ankle strengthening; -mini squat unsupported x10 reps with cues for reaching forward for better stance control; CGA required for better safety;                  PT Education - 03/02/18 1301    Education Details  progress towards goals, strengthening, HEP reinforced    Person(s) Educated  Patient    Methods  Explanation;Demonstration;Verbal cues    Comprehension  Verbalized understanding;Returned demonstration;Verbal cues required;Need further instruction       PT Short Term Goals - 03/02/18 1323      PT SHORT TERM GOAL #1   Title  Patient will be adherent to HEP at least 3x a week to improve functional strength and balance for better safety at home.    Baseline  Performing exercises everyday    Time  4    Period  Weeks    Status  Achieved      PT SHORT TERM GOAL #2   Title  Patient will be able to transfer sit<>Stand from a regular height chair with 1 UE assist or less, independent to improve ability to get up out of chairs/couch.    Time  4     Period  Weeks    Status  Achieved        PT Long Term Goals - 03/02/18 1324      PT LONG TERM GOAL #1   Title  Patient will increase BLE gross strength to 4+/5 as to improve functional strength  for independent gait, increased standing tolerance and increased ADL ability.    Time  8    Period  Weeks    Status  Partially Met      PT LONG TERM GOAL #2   Title  Patient will increase 10 meter walk test to >1.57ms as to improve gait speed for better community ambulation and to reduce fall risk.    Time  8    Period  Weeks    Status  Partially Met      PT LONG TERM GOAL #3   Title  Patient (> 654years old) will complete five times sit to stand test in < 15 seconds indicating an increased LE strength and improved balance.    Time  8    Period  Weeks    Status  Partially Met      PT LONG TERM GOAL #4   Title  Patient will be mod I when negotiating a curb with LRAD to exhibit improved gait ability and safety in community.     Time  8    Period  Weeks    Status  Partially Met            Plan - 03/02/18 1336    Clinical Impression Statement  Patient exhibits improvement in strength and mobility during outcome measure assessment as compared to initial evaluation on 01/20/18. He is still weak in LEs, particularly in right hip; Patient is able to transfer out of a chair with just 1 HHA; however due to increased height he is at a mechanical disadvantage for getting out of the chair without pushing with UE. Patient has made progress towards all goals but still demonstrates ability to make further improvement. Instructed patient in advanced LE strengthening exercise. He denies any increase in numbness with prolonged standing. Patient would benefit from additional skilled PT intervention to improve strength, balance and gait safety;  Patient's condition has the potential to improve in response to therapy. Maximum improvement is yet to be obtained. The anticipated improvement is attainable and  reasonable in a generally predictable time.  Patient reports improved ability with sit<>stand transfers and improved walking ability. However he reports continued weakness and is not functioning at his PLOF.      Rehab Potential  Fair    Clinical Impairments Affecting Rehab Potential  motivated; concerned about progressive stenosis with increased numbness in legs;     PT Frequency  2x / week    PT Duration  8 weeks    PT Treatment/Interventions  Aquatic Therapy;Cryotherapy;Electrical Stimulation;Moist Heat;Therapeutic exercise;Therapeutic activities;Functional mobility training;Stair training;Gait training;Balance training;Neuromuscular re-education;Patient/family education;Orthotic Fit/Training;Manual techniques;Energy conservation    PT Next Visit Plan  Advance HEP, progress resistance and strength training    PT Home Exercise Plan  seated marching, hamstring curls, and hip abduction with green tband; trunk rotation and single knee to chest in AM and PM, Standing mini squats    Consulted and Agree with Plan of Care  Patient       Patient will benefit from skilled therapeutic intervention in order to improve the following deficits and impairments:  Decreased endurance, Decreased activity tolerance, Decreased strength, Pain, Decreased balance, Decreased mobility, Difficulty walking, Postural dysfunction, Impaired flexibility, Decreased safety awareness  Visit Diagnosis: Muscle weakness (generalized)  Other abnormalities of gait and mobility     Problem List Patient Active Problem List   Diagnosis Date Noted  . Acute ST elevation myocardial infarction (STEMI) of anterolateral wall ([place]   . Angio-edema   .  Nonsustained ventricular tachycardia ([place])   . Acute respiratory failure ([place])   . Airway obstruction   . Macroglossia   . Non-STEMI (non-ST elevated myocardial infarction) ([place])   . Anaphylaxis 09/30/2016    Eilish Mcdaniel PT, DPT 03/02/2018, 2:49 PM  [place] MAIN [facility] SERVICES [address] [place], [place], [address] Phone: [phone]   Fax:  [phone]  Name: ANGELGABRIEL WILLMORE MRN: [id number] Date of Birth: 06/01/1933

## 2018-03-07 ENCOUNTER — Encounter: Payer: Self-pay | Admitting: Physical Therapy

## 2018-03-07 ENCOUNTER — Ambulatory Visit: Payer: Medicare Other | Admitting: Physical Therapy

## 2018-03-07 DIAGNOSIS — M6281 Muscle weakness (generalized): Secondary | ICD-10-CM | POA: Diagnosis not present

## 2018-03-07 DIAGNOSIS — R2689 Other abnormalities of gait and mobility: Secondary | ICD-10-CM

## 2018-03-07 NOTE — Therapy (Signed)
[place] MAIN [facility] SERVICES [place], [place], [address] Phone: [phone]   Fax:  [phone]  Physical Therapy Treatment  Patient Details  Name: Donald Riley MRN: [id number] Date of Birth: 06-30-1933 Referring Provider (PT): Hortencia Pilar MD   Encounter Date: 03/07/2018  PT End of Session - 03/07/18 1313    Visit Number  11    Number of Visits  17    Date for PT Re-Evaluation  03/17/18    Authorization Type  1/10 progress note    PT Start Time  1302    PT Stop Time  1345    PT Time Calculation (min)  43 min    Equipment Utilized During Treatment  Gait belt    Activity Tolerance  Patient tolerated treatment well    Behavior During Therapy  WFL for tasks assessed/performed       Past Medical History:  Diagnosis Date  . Allergy to alpha-gal   . Arthritis   . Asthma   . Atrial fibrillation ([place])   . Cancer ([place]) 2002  . GERD (gastroesophageal reflux disease)   . Glaucoma   . Gout   . Heart murmur   . Hypertension   . Neuropathy   . Osteoporosis   . Sleep apnea     Past Surgical History:  Procedure Laterality Date  . ARTHROPLASTY Right    ankle  . BUNIONECTOMY    . CARDIAC CATHETERIZATION N/A 07/02/2015   Procedure: Right/Left Heart Cath and Coronary Angiography;  Surgeon: Teodoro Spray, MD;  Location: [place] [facility];  Service: Cardiovascular;  Laterality: N/A;  . CORONARY/GRAFT ACUTE MI REVASCULARIZATION N/A 10/02/2016   Procedure: Coronary/Graft Acute MI Revascularization;  Surgeon: Wellington Hampshire, MD;  Location: [place] [facility];  Service: Cardiovascular;  Laterality: N/A;  . JOINT REPLACEMENT    . ostatectomy      There were no vitals filed for this visit.  Subjective Assessment - 03/07/18 1309    Subjective  Patient reports having a fall over the weekend; He was "trying to fill air in his wife's tires in the garage and he was trying to get up and couldn't get up all the way and fell back on  the concrete; Reports some soreness in posterior hip;     Pertinent History  84 yo Male reports increased weakness in BLE over last 6 months. Patient has a history of chronic low back pain with radicular symptoms. He is s/p L1/L2, L5/S1 non-surgical fusion with degenerative disc disease. In addition CT of 2017 shows a moderate-severe central spinal stenosis and severe facet disease. He denies any surgery for lumbar spine; He reports spine center reports that he is not a surgical candidate as the spine is too severe.  He has been to a chiropractor in the past with no relief. Patient also has a diagnosis of polyneuropathy which could be contributing to his symptoms. He is ambulating with a SPC. He will be following up with spine surgeon and possibly get steroid injection. He has had steriod injections in the past with mixed results.     How long can you sit comfortably?  NA    How long can you stand comfortably?  20-30 min, he reports increased numbness in legs after a few minutes    How long can you walk comfortably?  He walks with cane/walker, takes longer time, able to walk >500 feet; has a harder time walking downhill    Diagnostic tests  MRI shows multiple lumbar segment stenosis/facet degeneration    Patient Stated Goals  Improve walking, increase strength in legs, feeling more steady;     Currently in Pain?  Yes    Pain Score  2     Pain Location  Hip    Pain Orientation  Right;Left    Pain Descriptors / Indicators  Tender    Pain Type  Acute pain    Pain Radiating Towards  both hips (R>L)    Pain Onset  In the past 7 days    Pain Frequency  Constant    Aggravating Factors   worse with palpation;     Pain Relieving Factors  prolonged standing/bending over    Effect of Pain on Daily Activities  unable to stand for long periods of time;     Multiple Pain Sites  No           Ther-ex Progressed strengthening to standing with red tband around BLE: -hip extension x15 bilaterally; -hip  abduction x15 bilaterally; Hip flexion red tband x15 bilaterally Standing with red tband around BLE: -Side stepping x10 feet each direction x2 laps -Diagonal stepping forward/backward with 1 rail assist x10 feet x2 laps Patient required min-moderate verbal/tactile cues for correct exercise technique.  Standing on airex pad: -Heel/toe raises x15 reps with rail assist for balance; cues to keep knees straight for better ankle strengthening; -mini squat unsupported x15 reps with cues for reaching forward for better stance control; CGA required for better safety;    Pt ascend/descend 4 steps, forward non-reciprocal with B rail assist for balance; Standing, forward eccentric lower step downs 2x5 with B rail assist, min A for safety with cues for positioning and to slow down LE movement for better strength and control;  Following exercise, instructed patient in using rolling stick on each LE x1 min to reduce delayed onset muscle soreness and improve tissue extensibility;   Sidelying: Hip abduction SLR 2# x12 bilaterally with min VCs for positioning to improve hip control;                     PT Education - 03/07/18 1313    Education Details  LE strengthening, HEP reinforced    Person(s) Educated  Patient    Methods  Explanation;Demonstration;Verbal cues    Comprehension  Verbalized understanding;Returned demonstration;Verbal cues required;Need further instruction       PT Short Term Goals - 03/02/18 1323      PT SHORT TERM GOAL #1   Title  Patient will be adherent to HEP at least 3x a week to improve functional strength and balance for better safety at home.    Baseline  Performing exercises everyday    Time  4    Period  Weeks    Status  Achieved      PT SHORT TERM GOAL #2   Title  Patient will be able to transfer sit<>Stand from a regular height chair with 1 UE assist or less, independent to improve ability to get up out of chairs/couch.    Time  4    Period   Weeks    Status  Achieved        PT Long Term Goals - 03/02/18 1324      PT LONG TERM GOAL #1   Title  Patient will increase BLE gross strength to 4+/5 as to improve functional strength for independent gait, increased standing tolerance and increased ADL ability.    Time  8  Period  Weeks    Status  Partially Met      PT LONG TERM GOAL #2   Title  Patient will increase 10 meter walk test to >1.27ms as to improve gait speed for better community ambulation and to reduce fall risk.    Time  8    Period  Weeks    Status  Partially Met      PT LONG TERM GOAL #3   Title  Patient (> 626years old) will complete five times sit to stand test in < 15 seconds indicating an increased LE strength and improved balance.    Time  8    Period  Weeks    Status  Partially Met      PT LONG TERM GOAL #4   Title  Patient will be mod I when negotiating a curb with LRAD to exhibit improved gait ability and safety in community.     Time  8    Period  Weeks    Status  Partially Met            Plan - 03/07/18 1404    Clinical Impression Statement  Patient instructed in advanced LE strengthening; Progressed strengthening with standing eccentric lower step downs. patient does have increased difficulty with stepping down due to quad weakness. He required 2 rail assist and min A for safety with cues to slow down LE movement for better control. Patient denies any pain at end of session; Numbness monitored throughout session with patient denying any increase in numbness. He would benefit from additional skilled PT intervention to improve strength and gait safety;     Rehab Potential  Fair    Clinical Impairments Affecting Rehab Potential  motivated; concerned about progressive stenosis with increased numbness in legs;     PT Frequency  2x / week    PT Duration  8 weeks    PT Treatment/Interventions  Aquatic Therapy;Cryotherapy;Electrical Stimulation;Moist Heat;Therapeutic exercise;Therapeutic  activities;Functional mobility training;Stair training;Gait training;Balance training;Neuromuscular re-education;Patient/family education;Orthotic Fit/Training;Manual techniques;Energy conservation    PT Next Visit Plan  Advance HEP, progress resistance and strength training    PT Home Exercise Plan  seated marching, hamstring curls, and hip abduction with green tband; trunk rotation and single knee to chest in AM and PM, Standing mini squats    Consulted and Agree with Plan of Care  Patient       Patient will benefit from skilled therapeutic intervention in order to improve the following deficits and impairments:  Decreased endurance, Decreased activity tolerance, Decreased strength, Pain, Decreased balance, Decreased mobility, Difficulty walking, Postural dysfunction, Impaired flexibility, Decreased safety awareness  Visit Diagnosis: Muscle weakness (generalized)  Other abnormalities of gait and mobility     Problem List Patient Active Problem List   Diagnosis Date Noted  . Acute ST elevation myocardial infarction (STEMI) of anterolateral wall ([place]   . Angio-edema   . Nonsustained ventricular tachycardia ([place]   . Acute respiratory failure ([place]   . Airway obstruction   . Macroglossia   . Non-STEMI (non-ST elevated myocardial infarction) ([place]   . Anaphylaxis 09/30/2016    , PT, DPT 03/07/2018, 2:06 PM  [place] [facility] [address] [address]Phone: [phone]  Fax:  [phone] Name: SVARDAAN DEPASCALEMRN: [id number] of Birth: 409/18/1935

## 2018-03-09 ENCOUNTER — Ambulatory Visit: Payer: Medicare Other | Admitting: Physical Therapy

## 2018-03-09 ENCOUNTER — Encounter: Payer: Self-pay | Admitting: Physical Therapy

## 2018-03-09 DIAGNOSIS — R2689 Other abnormalities of gait and mobility: Secondary | ICD-10-CM

## 2018-03-09 DIAGNOSIS — M6281 Muscle weakness (generalized): Secondary | ICD-10-CM

## 2018-03-09 NOTE — Therapy (Signed)
[place] MAIN [facility] SERVICES [address] [place], [place], [address] Phone: [phone]   Fax:  [phone]  Physical Therapy Treatment  Patient Details  Name: Donald Riley MRN: [id number] Date of Birth: 1933-10-16 Referring Provider (PT): Hortencia Pilar MD   Encounter Date: 03/09/2018  PT End of Session - 03/09/18 1311    Visit Number  12    Number of Visits  17    Date for PT Re-Evaluation  03/17/18    Authorization Type  2/10 progress note    PT Start Time  1302    PT Stop Time  1345    PT Time Calculation (min)  43 min    Equipment Utilized During Treatment  Gait belt    Activity Tolerance  Patient tolerated treatment well    Behavior During Therapy  WFL for tasks assessed/performed       Past Medical History:  Diagnosis Date  . Allergy to alpha-gal   . Arthritis   . Asthma   . Atrial fibrillation ([place])   . Cancer ([place]) 2002  . GERD (gastroesophageal reflux disease)   . Glaucoma   . Gout   . Heart murmur   . Hypertension   . Neuropathy   . Osteoporosis   . Sleep apnea     Past Surgical History:  Procedure Laterality Date  . ARTHROPLASTY Right    ankle  . BUNIONECTOMY    . CARDIAC CATHETERIZATION N/A 07/02/2015   Procedure: Right/Left Heart Cath and Coronary Angiography;  Surgeon: Teodoro Spray, MD;  Location: [place] [facility];  Service: Cardiovascular;  Laterality: N/A;  . CORONARY/GRAFT ACUTE MI REVASCULARIZATION N/A 10/02/2016   Procedure: Coronary/Graft Acute MI Revascularization;  Surgeon: Wellington Hampshire, MD;  Location: [place] [facility];  Service: Cardiovascular;  Laterality: N/A;  . JOINT REPLACEMENT    . ostatectomy      There were no vitals filed for this visit.  Subjective Assessment - 03/09/18 1310    Subjective  Patient reports doing well; Reports that he is at baseline prior to his fall; He reports mild soreness in hips which is tender to touch but states that bruising is getting better;     Pertinent History  84 yo Male reports increased weakness in BLE over last 6 months. Patient has a history of chronic low back pain with radicular symptoms. He is s/p L1/L2, L5/S1 non-surgical fusion with degenerative disc disease. In addition CT of 2017 shows a moderate-severe central spinal stenosis and severe facet disease. He denies any surgery for lumbar spine; He reports spine center reports that he is not a surgical candidate as the spine is too severe.  He has been to a chiropractor in the past with no relief. Patient also has a diagnosis of polyneuropathy which could be contributing to his symptoms. He is ambulating with a SPC. He will be following up with spine surgeon and possibly get steroid injection. He has had steriod injections in the past with mixed results.     How long can you sit comfortably?  NA    How long can you stand comfortably?  20-30 min, he reports increased numbness in legs after a few minutes    How long can you walk comfortably?  He walks with cane/walker, takes longer time, able to walk >500 feet; has a harder time walking downhill    Diagnostic tests  MRI shows multiple lumbar segment stenosis/facet degeneration    Patient Stated Goals  Improve  walking, increase strength in legs, feeling more steady;     Currently in Pain?  No/denies    Multiple Pain Sites  No        TREATMENT: Warm up on treadmill, 1.0 mph with 2 HHA x3 min with cues for erect posture, increase step length and increase ankle DF at heel strike;   Ther-ex Progressed strengthening to standing with 2# ankle weight around each LE -hip extension x15 bilaterally; -hip abduction x15 bilaterally; -hip flexion march x15 bilaterally  Standing on airex pad 2# ankle weight around each LE -hamstring curl, x15 bilaterally with B rail assist, required min VCs for positioning to improve strengtheing: -Heel/toe raises x15 reps with rail assist for balance; cues to keep knees straight for better ankle  strengthening;  Standing, forward eccentric lower step downs 4inch step in parallel bars, 2x5 with B rail assist, close supervision for safety with cues for positioning and to slow down LE movement for better strength and control;  Following exercise, instructed patient in using rolling stick on each LE x1 min to reduce delayed onset muscle soreness and improve tissue extensibility;   Sit<>Stand from elevated mat table with airex pad under feet x5 reps holding  Ball to challenge LE strength; Required  Sidelying: Hip abduction SLR 2.5# x12 bilaterally with min VCs for positioning to improve hip control;                         PT Education - 03/09/18 1311    Education Details  LE strengthening, gait safety;     Person(s) Educated  Patient    Methods  Explanation;Demonstration;Verbal cues    Comprehension  Verbalized understanding;Returned demonstration;Verbal cues required;Need further instruction       PT Short Term Goals - 03/02/18 1323      PT SHORT TERM GOAL #1   Title  Patient will be adherent to HEP at least 3x a week to improve functional strength and balance for better safety at home.    Baseline  Performing exercises everyday    Time  4    Period  Weeks    Status  Achieved      PT SHORT TERM GOAL #2   Title  Patient will be able to transfer sit<>Stand from a regular height chair with 1 UE assist or less, independent to improve ability to get up out of chairs/couch.    Time  4    Period  Weeks    Status  Achieved        PT Long Term Goals - 03/02/18 1324      PT LONG TERM GOAL #1   Title  Patient will increase BLE gross strength to 4+/5 as to improve functional strength for independent gait, increased standing tolerance and increased ADL ability.    Time  8    Period  Weeks    Status  Partially Met      PT LONG TERM GOAL #2   Title  Patient will increase 10 meter walk test to >1.52ms as to improve gait speed for better community ambulation  and to reduce fall risk.    Time  8    Period  Weeks    Status  Partially Met      PT LONG TERM GOAL #3   Title  Patient (> 678years old) will complete five times sit to stand test in < 15 seconds indicating an increased LE strength and improved balance.  Time  8    Period  Weeks    Status  Partially Met      PT LONG TERM GOAL #4   Title  Patient will be mod I when negotiating a curb with LRAD to exhibit improved gait ability and safety in community.     Time  8    Period  Weeks    Status  Partially Met            Plan - 03/09/18 1344    Clinical Impression Statement  Patient instructed in advanced LE strengthening; Progressed strengthening with standing eccentric lower step downs. patient does have increased difficulty with stepping down due to quad weakness. He required 2 rail assist and close supervision for safety with cues to slow down LE movement for better control. Also utilized ankle weights for increased resistance; Patient denies any pain at end of session; Numbness monitored throughout session with patient denying any increase in numbness. He would benefit from additional skilled PT intervention to improve strength and gait safety;     Rehab Potential  Fair    Clinical Impairments Affecting Rehab Potential  motivated; concerned about progressive stenosis with increased numbness in legs;     PT Frequency  2x / week    PT Duration  8 weeks    PT Treatment/Interventions  Aquatic Therapy;Cryotherapy;Electrical Stimulation;Moist Heat;Therapeutic exercise;Therapeutic activities;Functional mobility training;Stair training;Gait training;Balance training;Neuromuscular re-education;Patient/family education;Orthotic Fit/Training;Manual techniques;Energy conservation    PT Next Visit Plan  Advance HEP, progress resistance and strength training    PT Home Exercise Plan  seated marching, hamstring curls, and hip abduction with green tband; trunk rotation and single knee to chest in AM  and PM, Standing mini squats    Consulted and Agree with Plan of Care  Patient       Patient will benefit from skilled therapeutic intervention in order to improve the following deficits and impairments:  Decreased endurance, Decreased activity tolerance, Decreased strength, Pain, Decreased balance, Decreased mobility, Difficulty walking, Postural dysfunction, Impaired flexibility, Decreased safety awareness  Visit Diagnosis: Muscle weakness (generalized)  Other abnormalities of gait and mobility     Problem List Patient Active Problem List   Diagnosis Date Noted  . Acute ST elevation myocardial infarction (STEMI) of anterolateral wall ([place])   . Angio-edema   . Nonsustained ventricular tachycardia ([place])   . Acute respiratory failure ([place])   . Airway obstruction   . Macroglossia   . Non-STEMI (non-ST elevated myocardial infarction) ([place])   . Anaphylaxis 09/30/2016    Trotter,Margaret PT, DPT 03/09/2018, 1:45 PM  [place] MAIN [facility] SERVICES [address] [place], [place], [address] Phone: [phone]   Fax:  [phone]  Name: RUBE SANCHEZ MRN: [id number] Date of Birth: [birth year]

## 2018-03-14 ENCOUNTER — Encounter: Payer: Self-pay | Admitting: Physical Therapy

## 2018-03-14 ENCOUNTER — Ambulatory Visit: Payer: Medicare Other | Admitting: Physical Therapy

## 2018-03-14 DIAGNOSIS — R2689 Other abnormalities of gait and mobility: Secondary | ICD-10-CM

## 2018-03-14 DIAGNOSIS — M6281 Muscle weakness (generalized): Secondary | ICD-10-CM

## 2018-03-14 NOTE — Patient Instructions (Signed)
Access Code: [email or web address]  URL: [email or web address]  Date: 03/14/2018  Prepared by: Blanche East   Exercises  Lunge with Counter Support - 10 reps - 2 sets - 1x daily - 7x weekly

## 2018-03-14 NOTE — Therapy (Signed)
[place] MAIN [facility] SERVICES [address] [place], [place], [address] Phone: [phone]   Fax:  [phone]  Physical Therapy Treatment  Patient Details  Name: Donald Riley MRN: [id number] Date of Birth: Aug 21, 1933 Referring Provider (PT): Hortencia Pilar MD   Encounter Date: 03/14/2018  PT End of Session - 03/14/18 1310    Visit Number  13    Number of Visits  17    Date for PT Re-Evaluation  03/17/18    Authorization Type  3/10 progress note    PT Start Time  1304    PT Stop Time  1345    PT Time Calculation (min)  41 min    Equipment Utilized During Treatment  Gait belt    Activity Tolerance  Patient tolerated treatment well    Behavior During Therapy  WFL for tasks assessed/performed       Past Medical History:  Diagnosis Date  . Allergy to alpha-gal   . Arthritis   . Asthma   . Atrial fibrillation ([place])   . Cancer ([place]) 2002  . GERD (gastroesophageal reflux disease)   . Glaucoma   . Gout   . Heart murmur   . Hypertension   . Neuropathy   . Osteoporosis   . Sleep apnea     Past Surgical History:  Procedure Laterality Date  . ARTHROPLASTY Right    ankle  . BUNIONECTOMY    . CARDIAC CATHETERIZATION N/A 07/02/2015   Procedure: Right/Left Heart Cath and Coronary Angiography;  Surgeon: Teodoro Spray, MD;  Location: [place] [facility];  Service: Cardiovascular;  Laterality: N/A;  . CORONARY/GRAFT ACUTE MI REVASCULARIZATION N/A 10/02/2016   Procedure: Coronary/Graft Acute MI Revascularization;  Surgeon: Wellington Hampshire, MD;  Location: [place] [facility];  Service: Cardiovascular;  Laterality: N/A;  . JOINT REPLACEMENT    . ostatectomy      There were no vitals filed for this visit.  Subjective Assessment - 03/14/18 1308    Subjective  Patient reports continued soreness in back of hip secondary to recent fall; He denies any new falls;     Pertinent History  84 yo Male reports increased weakness in BLE over last 6  months. Patient has a history of chronic low back pain with radicular symptoms. He is s/p L1/L2, L5/S1 non-surgical fusion with degenerative disc disease. In addition CT of 2017 shows a moderate-severe central spinal stenosis and severe facet disease. He denies any surgery for lumbar spine; He reports spine center reports that he is not a surgical candidate as the spine is too severe.  He has been to a chiropractor in the past with no relief. Patient also has a diagnosis of polyneuropathy which could be contributing to his symptoms. He is ambulating with a SPC. He will be following up with spine surgeon and possibly get steroid injection. He has had steriod injections in the past with mixed results.     Limitations  Other (comment)    How long can you sit comfortably?  NA    How long can you stand comfortably?  20-30 min, he reports increased numbness in legs after a few minutes    How long can you walk comfortably?  He walks with cane/walker, takes longer time, able to walk >500 feet; has a harder time walking downhill    Diagnostic tests  MRI shows multiple lumbar segment stenosis/facet degeneration    Patient Stated Goals  Improve walking, increase strength in legs, feeling more  steady;     Currently in Pain?  Yes    Pain Score  3     Pain Location  Hip    Pain Orientation  Right;Left    Pain Descriptors / Indicators  Aching;Sore;Tender    Pain Type  Acute pain    Pain Onset  1 to 4 weeks ago    Pain Frequency  Constant    Aggravating Factors   worse with palpation    Pain Relieving Factors  prolonged sitting/standing, bending over    Effect of Pain on Daily Activities  unable to stand for long periods of time;     Multiple Pain Sites  No       TREATMENT:   Ther-ex  Leg press: BLE plate105#, 2x12 BLE heel raises 105# 2x12  Patient required min VCS for correct positioning to improve correct muscle activation and strengthening;  Forward step ups airex to 4 inch step with airex x5  reps each LE with B rail assist, min A for safety and cues for weight shift for better step negotiation;  Educated patient in safe curb transfers for LE strengthening: Ascend/descend 4 inch curb with SPC x1 rep with min A for safety, patient required increased time and reports increased fear of falling; Ascend/descend 2 inch curb with SPC, CGA to close supervision x3 reps with better negotiation and safety; Patient exhibits less hesitancy; Exhibits good sequencing and safety awareness;   Patient instructed in advanced balance exercise  Standing in parallel bars:  Standing on airex foam: -alternate toe taps to 4 inch step with 2-1 rail assist x15 reps bilaterally; -Standing one foot on airex, one foot on 4 inch step, BUE wand flexion  x5 reps each foot on step, had a harder time with right foot on step due to decreased weight shift;  -modified tandem stance: head turns side/side x5 reps each foot in front; Patient required min VCs for balance stability, including to increase trunk control for less loss of balance with smaller base of support  Tandem stance on airex beam 5 sec hold x4 reps each foot in front, with 2-0 rail assist; Side stepping on airex beam x3 laps with B rail assist;  Patient requires cues for foot placement and to improve erect posture for better balance control;   Gait on grass x20 feet with SPC with CGA for safety; Patient exhibits slower gait speed and decreased step length due to caution on uneven surface;              PT Education - 03/14/18 1310    Education Details  LE strengthening, gait safety, balance;     Person(s) Educated  Patient    Methods  Explanation;Demonstration;Verbal cues    Comprehension  Verbalized understanding;Returned demonstration;Verbal cues required;Need further instruction       PT Short Term Goals - 03/02/18 1323      PT SHORT TERM GOAL #1   Title  Patient will be adherent to HEP at least 3x a week to improve functional  strength and balance for better safety at home.    Baseline  Performing exercises everyday    Time  4    Period  Weeks    Status  Achieved      PT SHORT TERM GOAL #2   Title  Patient will be able to transfer sit<>Stand from a regular height chair with 1 UE assist or less, independent to improve ability to get up out of chairs/couch.    Time  4  Period  Weeks    Status  Achieved        PT Long Term Goals - 03/02/18 1324      PT LONG TERM GOAL #1   Title  Patient will increase BLE gross strength to 4+/5 as to improve functional strength for independent gait, increased standing tolerance and increased ADL ability.    Time  8    Period  Weeks    Status  Partially Met      PT LONG TERM GOAL #2   Title  Patient will increase 10 meter walk test to >1.39ms as to improve gait speed for better community ambulation and to reduce fall risk.    Time  8    Period  Weeks    Status  Partially Met      PT LONG TERM GOAL #3   Title  Patient (> 853years old) will complete five times sit to stand test in < 15 seconds indicating an increased LE strength and improved balance.    Time  8    Period  Weeks    Status  Partially Met      PT LONG TERM GOAL #4   Title  Patient will be mod I when negotiating a curb with LRAD to exhibit improved gait ability and safety in community.     Time  8    Period  Weeks    Status  Partially Met            Plan - 03/14/18 1357    Clinical Impression Statement  Progressed LE strengthening with increased resistance/repetition; Also instructed patient in forward lunges to challenge weight shift with dynamic movemnet; advanced HEP, see patient instructions. Initiated balance exercise to improve LE stabilization while on compliant surface. Patient did have difficulty with LLE stance while RLE on step due to poor weight shift to RLE due to feeling like he was falling. [place] does require min A and rail assist for safety; Educated pt in various curb transfers  with SPC to challenge LE strength and mobility; He would benefit from additional skilled PT Intervention to improve strength, balance and gait safety;     Rehab Potential  Fair    Clinical Impairments Affecting Rehab Potential  motivated; concerned about progressive stenosis with increased numbness in legs;     PT Frequency  2x / week    PT Duration  8 weeks    PT Treatment/Interventions  Aquatic Therapy;Cryotherapy;Electrical Stimulation;Moist Heat;Therapeutic exercise;Therapeutic activities;Functional mobility training;Stair training;Gait training;Balance training;Neuromuscular re-education;Patient/family education;Orthotic Fit/Training;Manual techniques;Energy conservation    PT Next Visit Plan  Advance HEP, progress resistance and strength training    PT Home Exercise Plan  seated marching, hamstring curls, and hip abduction with green tband; trunk rotation and single knee to chest in AM and PM, Standing mini squats    Consulted and Agree with Plan of Care  Patient       Patient will benefit from skilled therapeutic intervention in order to improve the following deficits and impairments:  Decreased endurance, Decreased activity tolerance, Decreased strength, Pain, Decreased balance, Decreased mobility, Difficulty walking, Postural dysfunction, Impaired flexibility, Decreased safety awareness  Visit Diagnosis: Muscle weakness (generalized)  Other abnormalities of gait and mobility     Problem List Patient Active Problem List   Diagnosis Date Noted  . Acute ST elevation myocardial infarction (STEMI) of anterolateral wall ([place]   . Angio-edema   . N[place]ustained [place] tachycardia ([place]   . Acute respiratory failure ([place]   .  Airway obstruction   . Macroglossia   . Non-STEMI (non-ST elevated myocardial infarction) ([place])   . Anaphylaxis 09/30/2016    Gwen Edler PT, DPT 03/14/2018, 2:00 PM  [place] MAIN [facility] SERVICES [address] [place], [place], [address] Phone: [phone]   Fax:  [phone]  Name: Donald Riley MRN: [id number] Date of Birth: Apr 28, 1933

## 2018-03-16 ENCOUNTER — Ambulatory Visit: Payer: Medicare Other | Admitting: Physical Therapy

## 2018-03-16 ENCOUNTER — Encounter: Payer: Self-pay | Admitting: Physical Therapy

## 2018-03-16 DIAGNOSIS — M6281 Muscle weakness (generalized): Secondary | ICD-10-CM

## 2018-03-16 DIAGNOSIS — R2689 Other abnormalities of gait and mobility: Secondary | ICD-10-CM

## 2018-03-16 NOTE — Therapy (Signed)
[place] MAIN [facility] SERVICES [address] [place], [place], [address] Phone: [phone]   Fax:  [phone]  Physical Therapy Treatment  Patient Details  Name: Donald Riley MRN: [id number] Date of Birth: 10/12/1933 Referring Provider (PT): Hortencia Pilar MD   Encounter Date: 03/16/2018  PT End of Session - 03/16/18 1304    Visit Number  14    Number of Visits  21    Date for PT Re-Evaluation  04/13/18    Authorization Type  4/10 progress note    PT Start Time  1302    PT Stop Time  1345    PT Time Calculation (min)  43 min    Equipment Utilized During Treatment  Gait belt    Activity Tolerance  Patient tolerated treatment well    Behavior During Therapy  WFL for tasks assessed/performed       Past Medical History:  Diagnosis Date  . Allergy to alpha-gal   . Arthritis   . Asthma   . Atrial fibrillation ([place])   . Cancer ([place]) 2002  . GERD (gastroesophageal reflux disease)   . Glaucoma   . Gout   . Heart murmur   . Hypertension   . Neuropathy   . Osteoporosis   . Sleep apnea     Past Surgical History:  Procedure Laterality Date  . ARTHROPLASTY Right    ankle  . BUNIONECTOMY    . CARDIAC CATHETERIZATION N/A 07/02/2015   Procedure: Right/Left Heart Cath and Coronary Angiography;  Surgeon: Teodoro Spray, MD;  Location: [place] [facility];  Service: Cardiovascular;  Laterality: N/A;  . CORONARY/GRAFT ACUTE MI REVASCULARIZATION N/A 10/02/2016   Procedure: Coronary/Graft Acute MI Revascularization;  Surgeon: Wellington Hampshire, MD;  Location: [place] [facility];  Service: Cardiovascular;  Laterality: N/A;  . JOINT REPLACEMENT    . ostatectomy      There were no vitals filed for this visit.  Subjective Assessment - 03/16/18 1308    Subjective  Patient reports, "I don't know what I've done, but my right leg is so weak today."     Pertinent History  84 yo Male reports increased weakness in BLE over last 6 months. Patient has  a history of chronic low back pain with radicular symptoms. He is s/p L1/L2, L5/S1 non-surgical fusion with degenerative disc disease. In addition CT of 2017 shows a moderate-severe central spinal stenosis and severe facet disease. He denies any surgery for lumbar spine; He reports spine center reports that he is not a surgical candidate as the spine is too severe.  He has been to a chiropractor in the past with no relief. Patient also has a diagnosis of polyneuropathy which could be contributing to his symptoms. He is ambulating with a SPC. He will be following up with spine surgeon and possibly get steroid injection. He has had steriod injections in the past with mixed results.     Limitations  Other (comment)    How long can you sit comfortably?  NA    How long can you stand comfortably?  20-30 min, he reports increased numbness in legs after a few minutes    How long can you walk comfortably?  He walks with cane/walker, takes longer time, able to walk >500 feet; has a harder time walking downhill    Diagnostic tests  MRI shows multiple lumbar segment stenosis/facet degeneration    Patient Stated Goals  Improve walking, increase strength in legs, feeling more steady;  Currently in Pain?  No/denies    Pain Onset  1 to 4 weeks ago    Multiple Pain Sites  No              TREATMENT:  Ther-ex  Leg press: BLE plate 90# F64 RLE only plate 60# P32 Patient required min-moderate verbal/tactile cues for correct exercise technique including min VCs for positioning for optimum muscle activation;  Forward step ups airex to 4 inch step with airex x5 reps each LE with B rail assist, CGA for safety and cues for weight shift for better step negotiation;  side stepping with green tband x10 feet x3 laps each direction with B rail assist for safety;  Forward lunges with BOSU x5 reps each LE with B rail assist; Required min VCS to push off front leg for better technique and balance;   During  session, patient reports increased numbness in right foot with weakness; Instructed patient in hooklying lumbar flexion extension concurrent with moist heat to low back: -lumbar trunk rotation x10 reps bilaterally; -single knee to chest stretch 20 sec hold x2 reps bilaterally; -posterior pelvic rocks x10 reps with min VCs to increase core activation and avoid compensation for better core strengthening;               Patient instructed in advanced balance exercise  Standing in parallel bars:  Standing on airex foam: -alternate toe taps to 4 inch step with 2-1 rail assist x15 reps bilaterally; -Standing one foot on airex, one foot on 4 inch step, BUE wand flexion  x5 reps each foot on step, exhibits better stance control today as compared to last session;  Standing on BOSU: -Side step airex-BOSU-airex x5 reps each direction with B HHA on rail with cues to increase step length for better foot clearance;                  PT Education - 03/16/18 1304    Education Details  LE strengthening, gait/balance;     Person(s) Educated  Patient    Methods  Explanation;Demonstration;Verbal cues    Comprehension  Verbalized understanding;Returned demonstration;Verbal cues required;Need further instruction       PT Short Term Goals - 03/02/18 1323      PT SHORT TERM GOAL #1   Title  Patient will be adherent to HEP at least 3x a week to improve functional strength and balance for better safety at home.    Baseline  Performing exercises everyday    Time  4    Period  Weeks    Status  Achieved      PT SHORT TERM GOAL #2   Title  Patient will be able to transfer sit<>Stand from a regular height chair with 1 UE assist or less, independent to improve ability to get up out of chairs/couch.    Time  4    Period  Weeks    Status  Achieved        PT Long Term Goals - 03/02/18 1324      PT LONG TERM GOAL #1   Title  Patient will increase BLE gross strength to 4+/5 as to improve  functional strength for independent gait, increased standing tolerance and increased ADL ability.    Time  8    Period  Weeks    Status  Partially Met      PT LONG TERM GOAL #2   Title  Patient will increase 10 meter walk test to >1.87ms as to improve  gait speed for better community ambulation and to reduce fall risk.    Time  8    Period  Weeks    Status  Partially Met      PT LONG TERM GOAL #3   Title  Patient (> 12 years old) will complete five times sit to stand test in < 15 seconds indicating an increased LE strength and improved balance.    Time  8    Period  Weeks    Status  Partially Met      PT LONG TERM GOAL #4   Title  Patient will be mod I when negotiating a curb with LRAD to exhibit improved gait ability and safety in community.     Time  8    Period  Weeks    Status  Partially Met            Plan - 03/16/18 1345    Clinical Impression Statement  Patient instructed in advanced LE strengthening, focusing on RLE to improve RLE motor control; He reported increased numbness/fatigue in RLE, transitioned to hooklying lumbar flexion exercise to reduce nerve impingement; patient tolerated well; Instructed patient in advanced balance exercise, utilizing unstable surfaces such as BOSU for better balance challenge; He does require rail assist for better balance control. He would benefit from additional skilled PT intervention to improve strength and balance for improved mobility;     Rehab Potential  Fair    Clinical Impairments Affecting Rehab Potential  motivated; concerned about progressive stenosis with increased numbness in legs;     PT Frequency  2x / week    PT Duration  8 weeks    PT Treatment/Interventions  Aquatic Therapy;Cryotherapy;Electrical Stimulation;Moist Heat;Therapeutic exercise;Therapeutic activities;Functional mobility training;Stair training;Gait training;Balance training;Neuromuscular re-education;Patient/family education;Orthotic Fit/Training;Manual  techniques;Energy conservation    PT Next Visit Plan  Advance HEP, progress resistance and strength training    PT Home Exercise Plan  seated marching, hamstring curls, and hip abduction with green tband; trunk rotation and single knee to chest in AM and PM, Standing mini squats    Consulted and Agree with Plan of Care  Patient       Patient will benefit from skilled therapeutic intervention in order to improve the following deficits and impairments:  Decreased endurance, Decreased activity tolerance, Decreased strength, Pain, Decreased balance, Decreased mobility, Difficulty walking, Postural dysfunction, Impaired flexibility, Decreased safety awareness  Visit Diagnosis: Muscle weakness (generalized)  Other abnormalities of gait and mobility     Problem List Patient Active Problem List   Diagnosis Date Noted  . Acute ST elevation myocardial infarction (STEMI) of anterolateral wall ([place])   . Angio-edema   . Nonsustained ventricular tachycardia ([place])   . Acute respiratory failure ([place])   . Airway obstruction   . Macroglossia   . Non-STEMI (non-ST elevated myocardial infarction) ([place])   . Anaphylaxis 09/30/2016    Yaniel Limbaugh PT, DPT 03/16/2018, 1:54 PM  [place] [facility] MAIN [facility] SERVICES [address] [place], [place], [address] Phone: [phone]   Fax:  [phone]  Name: Donald Riley MRN: [id number] Date of Birth: March 15, 1934

## 2018-03-21 ENCOUNTER — Ambulatory Visit: Payer: Medicare Other | Admitting: Physical Therapy

## 2018-03-21 ENCOUNTER — Encounter: Payer: Self-pay | Admitting: Physical Therapy

## 2018-03-21 DIAGNOSIS — R2689 Other abnormalities of gait and mobility: Secondary | ICD-10-CM

## 2018-03-21 DIAGNOSIS — M6281 Muscle weakness (generalized): Secondary | ICD-10-CM | POA: Diagnosis not present

## 2018-03-21 NOTE — Therapy (Addendum)
[place] MAIN [facility] SERVICES [address] [place], [place], [address] Phone: [phone]   Fax:  [phone]  Physical Therapy Treatment  Patient Details  Name: Donald Riley MRN: [id number] Date of Birth: November 05, 1933 Referring Provider (PT): Hortencia Pilar MD   Encounter Date: 03/21/2018  PT End of Session - 03/21/18 1309    Visit Number  15    Number of Visits  25    Date for PT Re-Evaluation  04/13/18    Authorization Type  5/10 progress note    PT Start Time  1302    PT Stop Time  1345    PT Time Calculation (min)  43 min    Equipment Utilized During Treatment  Gait belt    Activity Tolerance  Patient tolerated treatment well    Behavior During Therapy  WFL for tasks assessed/performed       Past Medical History:  Diagnosis Date  . Allergy to alpha-gal   . Arthritis   . Asthma   . Atrial fibrillation ([place])   . Cancer ([place]) 2002  . GERD (gastroesophageal reflux disease)   . Glaucoma   . Gout   . Heart murmur   . Hypertension   . Neuropathy   . Osteoporosis   . Sleep apnea     Past Surgical History:  Procedure Laterality Date  . ARTHROPLASTY Right    ankle  . BUNIONECTOMY    . CARDIAC CATHETERIZATION N/A 07/02/2015   Procedure: Right/Left Heart Cath and Coronary Angiography;  Surgeon: Teodoro Spray, MD;  Location: [place] [facility];  Service: Cardiovascular;  Laterality: N/A;  . CORONARY/GRAFT ACUTE MI REVASCULARIZATION N/A 10/02/2016   Procedure: Coronary/Graft Acute MI Revascularization;  Surgeon: Wellington Hampshire, MD;  Location: [place] [facility];  Service: Cardiovascular;  Laterality: N/A;  . JOINT REPLACEMENT    . ostatectomy      There were no vitals filed for this visit.  Subjective Assessment - 03/21/18 1308    Subjective  Patient reports, "I am doing better. I woke up Saturday and my back pain was gone, it hasn't hurt since then." He does report continued numbness in feet but states that it is better  overall; denies any recent falls; reports adherence to HEP;     Pertinent History  84 yo Male reports increased weakness in BLE over last 6 months. Patient has a history of chronic low back pain with radicular symptoms. He is s/p L1/L2, L5/S1 non-surgical fusion with degenerative disc disease. In addition CT of 2017 shows a moderate-severe central spinal stenosis and severe facet disease. He denies any surgery for lumbar spine; He reports spine center reports that he is not a surgical candidate as the spine is too severe.  He has been to a chiropractor in the past with no relief. Patient also has a diagnosis of polyneuropathy which could be contributing to his symptoms. He is ambulating with a SPC. He will be following up with spine surgeon and possibly get steroid injection. He has had steriod injections in the past with mixed results.     Limitations  Other (comment)    How long can you sit comfortably?  NA    How long can you stand comfortably?  20-30 min, he reports increased numbness in legs after a few minutes    How long can you walk comfortably?  He walks with cane/walker, takes longer time, able to walk >500 feet; has a harder time walking downhill  Diagnostic tests  MRI shows multiple lumbar segment stenosis/facet degeneration    Patient Stated Goals  Improve walking, increase strength in legs, feeling more steady;     Currently in Pain?  No/denies    Pain Onset  1 to 4 weeks ago    Multiple Pain Sites  No          TREATMENT: Ther-ex Leg press: BLE plate 90# Y78 RLE only plate 60# G95 Patient required min-moderate verbal/tactile cues for correct exercise technique including min VCs for positioning for optimum muscle activation;  Forward step ups airex to 4 inch step with airex x5 reps each LE with B rail assist, CGA for safety and cues for weight shift for better step negotiation;  side stepping with green tband x10 feet x3 laps each direction with B rail assist for  safety;  Standing with feet apart, BLE heel raises x15 reps with cues to keep knee straight for better ankle strengthening;  Standing on rockerboard board calf stretch 20 sec hold x2 reps with min VCs for positioning for better flexibility;    Patient instructed in advanced balance exercise  Standing in parallel bars:  Standing on airex foam: -Standing one foot on airex, one foot on 4 inch step, BUEball pass side/sidex5 reps each foot on step, requires min A for safety; able to progress with improved trunk rotation with less loss of balance; -tandem stance unsupported 10 sec hold x 2 reps each LE in front; -modified tandem stance: head turns side/side, up/down x5 reps each foot in front; -Feet shoulder width apart, eyes open/closed 10 sec hold x3 reps with minA for safety; exhibits increased posterior loss of balance requiring min A for safety; Patient required min VCs for balance sta  bility, including to increase trunk control for less loss of balance with smaller base of support  Standing on rockerboard: teeter forward/backward with 2-1 rail assist x2 min with CGA for safety; exhibits increased knee flexion with forward weight shift requiring increased quad control for less loss of balance;  Following rockerboard, able to stand on airex pad with feet apart, eyes closed with better stance control and less loss of balance;     Advanced HEP: SLS on firm surface 5 sec hold x3 reps each LE with cues for hand placement and postural control for better balance; -modified tandem stance on level surface 10 sec hold x2 reps; -forward/backward walking x10 feet x2 laps each direction with close supervision for safety;                     PT Education - 03/21/18 1309    Education Details  LE strengthening, gait/balance safety; HEP reinforced;     Person(s) Educated  Patient    Methods  Explanation;Demonstration;Verbal cues    Comprehension  Verbalized  understanding;Returned demonstration;Verbal cues required;Need further instruction       PT Short Term Goals - 03/02/18 1323      PT SHORT TERM GOAL #1   Title  Patient will be adherent to HEP at least 3x a week to improve functional strength and balance for better safety at home.    Baseline  Performing exercises everyday    Time  4    Period  Weeks    Status  Achieved      PT SHORT TERM GOAL #2   Title  Patient will be able to transfer sit<>Stand from a regular height chair with 1 UE assist or less, independent to improve ability to  get up out of chairs/couch.    Time  4    Period  Weeks    Status  Achieved        PT Long Term Goals - 03/02/18 1324      PT LONG TERM GOAL #1   Title  Patient will increase BLE gross strength to 4+/5 as to improve functional strength for independent gait, increased standing tolerance and increased ADL ability.    Time  8    Period  Weeks    Status  Partially Met      PT LONG TERM GOAL #2   Title  Patient will increase 10 meter walk test to >1.23ms as to improve gait speed for better community ambulation and to reduce fall risk.    Time  8    Period  Weeks    Status  Partially Met      PT LONG TERM GOAL #3   Title  Patient (> 640years old) will complete five times sit to stand test in < 15 seconds indicating an increased LE strength and improved balance.    Time  8    Period  Weeks    Status  Partially Met      PT LONG TERM GOAL #4   Title  Patient will be mod I when negotiating a curb with LRAD to exhibit improved gait ability and safety in community.     Time  8    Period  Weeks    Status  Partially Met            Plan - 03/21/18 1346    Clinical Impression Statement  Patient instructed in advanced LE strengthening, focusing on RLE quad strengthening with leg press. He denies any increase in pain/symptoms with advanced exercise. Progressed HEP with balance exercise. He requires min VCs for correct exercise technique and  positioning/postural control. Instructed patient in balance exercise to challenge ankle strategies; at end of session he is able to exhibit better stance control with eyes closed with less loss of balance. He would benefit from additional skilled PT Intervention to improve strength, balance and gait safety;     Rehab Potential  Fair    Clinical Impairments Affecting Rehab Potential  motivated; concerned about progressive stenosis with increased numbness in legs;     PT Frequency  2x / week    PT Duration  4 weeks    PT Treatment/Interventions  Aquatic Therapy;Cryotherapy;Electrical Stimulation;Moist Heat;Therapeutic exercise;Therapeutic activities;Functional mobility training;Stair training;Gait training;Balance training;Neuromuscular re-education;Patient/family education;Orthotic Fit/Training;Manual techniques;Energy conservation    PT Next Visit Plan  Advance HEP, progress resistance and strength training    PT Home Exercise Plan  seated marching, hamstring curls, and hip abduction with green tband; trunk rotation and single knee to chest in AM and PM, Standing mini squats    Consulted and Agree with Plan of Care  Patient       Patient will benefit from skilled therapeutic intervention in order to improve the following deficits and impairments:  Decreased endurance, Decreased activity tolerance, Decreased strength, Pain, Decreased balance, Decreased mobility, Difficulty walking, Postural dysfunction, Impaired flexibility, Decreased safety awareness  Visit Diagnosis: Muscle weakness (generalized)  Other abnormalities of gait and mobility     Problem List Patient Active Problem List   Diagnosis Date Noted  . Acute ST elevation myocardial infarction (STEMI) of anterolateral wall ([place]   . Angio-edema   . [place] tachycardia ([place]   . Acute respiratory failure ([place]   . Airway obstruction   .  Macroglossia   . Non-STEMI (non-ST elevated myocardial infarction) ([place])   .  Anaphylaxis 09/30/2016    Trotter,Margaret PT, DPT 03/21/2018, 1:48 PM  [place] [facility] MAIN [facility] SERVICES [address] [place], [place], [address] Phone: [phone]   Fax:  [phone]  Name: RONDELL PARDON MRN: [id number] Date of Birth: 1933/08/12

## 2018-03-21 NOTE — Patient Instructions (Signed)
Access Code: [email or web address]  URL: [email or web address]  Date: 03/21/2018  Prepared by: Blanche East   Exercises  Tandem Stance with Support - 3 reps - 2 sets - 10 hold - 1x daily - 7x weekly  Backwards Walking - 5 reps - 2 sets - 1x daily - 7x weekly  Standing Single Leg Stance with Unilateral Counter Support - 4 reps - 2 sets - 5 hold - 1x daily - 7x weekly  Heel rises with counter support - 15 reps - 2 sets - 1x daily - 7x weekly

## 2018-03-22 ENCOUNTER — Other Ambulatory Visit: Payer: Self-pay

## 2018-03-22 ENCOUNTER — Emergency Department: Payer: Medicare Other

## 2018-03-22 ENCOUNTER — Emergency Department
Admission: EM | Admit: 2018-03-22 | Discharge: 2018-03-22 | Disposition: A | Discharge status: Home / Self Care | Payer: Medicare Other | Attending: Emergency Medicine | Admitting: Emergency Medicine

## 2018-03-22 DIAGNOSIS — R319 Hematuria, unspecified: Secondary | ICD-10-CM

## 2018-03-22 DIAGNOSIS — I10 Essential (primary) hypertension: Secondary | ICD-10-CM | POA: Insufficient documentation

## 2018-03-22 DIAGNOSIS — Z79899 Other long term (current) drug therapy: Secondary | ICD-10-CM | POA: Diagnosis not present

## 2018-03-22 DIAGNOSIS — N200 Calculus of kidney: Secondary | ICD-10-CM | POA: Diagnosis not present

## 2018-03-22 LAB — CBC WITH DIFFERENTIAL/PLATELET
Abs Immature Granulocytes: 0.02 10*3/uL (ref 0.00–0.07)
Basophils Absolute: 0.1 10*3/uL (ref 0.0–0.1)
Basophils Relative: 2 %
Eosinophils Absolute: 0.2 10*3/uL (ref 0.0–0.5)
Eosinophils Relative: 3 %
HCT: 48.6 % (ref 39.0–52.0)
Hemoglobin: 15.3 g/dL (ref 13.0–17.0)
Immature Granulocytes: 0 %
Lymphocytes Relative: 16 %
Lymphs Abs: 1 10*3/uL (ref 0.7–4.0)
MCH: 32.6 pg (ref 26.0–34.0)
MCHC: 31.5 g/dL (ref 30.0–36.0)
MCV: 103.6 fL — ABNORMAL HIGH (ref 80.0–100.0)
Monocytes Absolute: 0.7 10*3/uL (ref 0.1–1.0)
Monocytes Relative: 11 %
Neutro Abs: 4.5 10*3/uL (ref 1.7–7.7)
Neutrophils Relative %: 68 %
Platelets: 170 10*3/uL (ref 150–400)
RBC: 4.69 MIL/uL (ref 4.22–5.81)
RDW: 14.2 % (ref 11.5–15.5)
WBC: 6.5 10*3/uL (ref 4.0–10.5)
nRBC: 0 % (ref 0.0–0.2)

## 2018-03-22 LAB — BASIC METABOLIC PANEL
Anion gap: 8 (ref 5–15)
BUN: 15 mg/dL (ref 8–23)
CO2: 29 mmol/L (ref 22–32)
Calcium: 9.1 mg/dL (ref 8.9–10.3)
Chloride: 102 mmol/L (ref 98–111)
Creatinine, Ser: 1.01 mg/dL (ref 0.61–1.24)
GFR calc Af Amer: >60 mL/min (ref >60)
GFR calc non Af Amer: >60 mL/min (ref >60)
Glucose, Bld: 112 mg/dL — ABNORMAL HIGH (ref 70–99)
Potassium: 4.6 mmol/L (ref 3.5–5.1)
Sodium: 139 mmol/L (ref 135–145)

## 2018-03-22 LAB — URINALYSIS, COMPLETE (UACMP) WITH MICROSCOPIC
Bilirubin Urine: NEGATIVE
Glucose, UA: NEGATIVE mg/dL
Ketones, ur: NEGATIVE mg/dL
Leukocytes, UA: NEGATIVE
Nitrite: NEGATIVE
Protein, ur: 30 mg/dL — AB
RBC / HPF: >50 RBC/hpf — ABNORMAL HIGH (ref 0–5)
Specific Gravity, Urine: 1.006 (ref 1.005–1.030)
pH: 7 (ref 5.0–8.0)

## 2018-03-22 LAB — PROTIME-INR
INR: 0.99
Prothrombin Time: 13 seconds (ref 11.4–15.2)

## 2018-03-22 NOTE — ED Provider Notes (Signed)
[facility] Emergency Department Provider Note  ____________________________________________  Time seen: Approximately 9:00 AM  I have reviewed the triage vital signs and the nursing notes.   HISTORY  Chief Complaint Hematuria   HPI Donald Riley is a 84 y.o. male with a history of atrial fibrillation, CAD on Plavix, prostatectomy for prostate cancer, hypertension who presents for evaluation of hematuria.  Patient reports hematuria for 2 days.  He describes his urine as dark yellow/ orange with blood clots.  This morning he had a sharp severe lower abdominal pain which has now resolved which prompted his visit to the ED.  He feels that he is fully able to empty his bladder.  He denies dysuria, fever, chills, flank pain, history of kidney stones, history of AAA, testicular pain or swelling.  No dizziness or chest pain.  Past Medical History:  Diagnosis Date  . Allergy to alpha-gal   . Arthritis   . Asthma   . Atrial fibrillation ([place])   . Cancer ([place]) 2002  . GERD (gastroesophageal reflux disease)   . Glaucoma   . Gout   . Heart murmur   . Hypertension   . Neuropathy   . Osteoporosis   . Sleep apnea     Patient Active Problem List   Diagnosis Date Noted  . Acute ST elevation myocardial infarction (STEMI) of anterolateral wall ([place])   . Angio-edema   . Nonsustained ventricular tachycardia ([place])   . Acute respiratory failure ([place])   . Airway obstruction   . Macroglossia   . Non-STEMI (non-ST elevated myocardial infarction) ([place])   . Anaphylaxis 09/30/2016    Past Surgical History:  Procedure Laterality Date  . ARTHROPLASTY Right    ankle  . BUNIONECTOMY    . CARDIAC CATHETERIZATION N/A 07/02/2015   Procedure: Right/Left Heart Cath and Coronary Angiography;  Surgeon: Teodoro Spray, MD;  Location: [place] [facility];  Service: Cardiovascular;  Laterality: N/A;  . CORONARY/GRAFT ACUTE MI REVASCULARIZATION N/A 10/02/2016   Procedure:  Coronary/Graft Acute MI Revascularization;  Surgeon: Wellington Hampshire, MD;  Location: [place] [facility];  Service: Cardiovascular;  Laterality: N/A;  . JOINT REPLACEMENT    . ostatectomy      Prior to Admission medications   Medication Sig Start Date End Date Taking? Authorizing Provider  albuterol (VENTOLIN HFA) 108 (90 BASE) MCG/ACT inhaler Inhale into the lungs every 4 (four) hours as needed.  01/30/14   [provider]  amiodarone (PACERONE) 200 MG tablet  10/06/16   [provider]  aspirin 81 MG chewable tablet Chew 1 tablet (81 mg total) by mouth daily. 10/06/16   Demetrios Loll, MD  atorvastatin (LIPITOR) 40 MG tablet Take 1 tablet (40 mg total) by mouth daily at 6 PM. Patient not taking: Reported on 01/20/2018 10/06/16   Demetrios Loll, MD  baclofen (LIORESAL) 10 MG tablet Take 0.5-1 tablets (5-10 mg total) by mouth 3 (three) times daily as needed for muscle spasms. 10/07/17   Coral Spikes, DO  brimonidine (ALPHAGAN P) 0.1 % SOLN Alphagan P 0.1 % eye drops    [provider]  clopidogrel (PLAVIX) 75 MG tablet Take 1 tablet (75 mg total) by mouth daily with breakfast. 10/07/16   Demetrios Loll, MD  dorzolamide-timolol (COSOPT) 22.3-6.8 MG/ML ophthalmic solution  08/20/16   [provider]  EPINEPHrine 0.3 mg/0.3 mL IJ SOAJ injection Inject 0.3 mLs (0.3 mg total) into the muscle once as needed. 10/06/16   Demetrios Loll, MD  furosemide (LASIX) 20 MG tablet Take 1 tablet (20 mg total) by mouth daily. 10/06/16   Demetrios Loll, MD  latanoprost (XALATAN) 0.005 % ophthalmic solution Place 1 drop into both eyes at bedtime.  01/30/13   [provider]  metoprolol succinate (TOPROL-XL) 25 MG 24 hr tablet metoprolol succinate ER 25 mg tablet,extended release 24 hr  Take 1 tablet every day by oral route.    [provider]  mometasone-formoterol (DULERA) 200-5 MCG/ACT AERO Dulera 200 mcg-5 mcg/actuation HFA aerosol inhaler    [provider]  MULTIPLE VITAMIN  PO Take by mouth.    [provider]  pantoprazole (PROTONIX) 40 MG tablet Take 40 mg by mouth daily. Reported on 07/02/2015 05/19/12   [provider]  pramipexole (MIRAPEX) 0.25 MG tablet Take 0.25-0.5 mg by mouth 2 (two) times daily.     [provider]  predniSONE (DELTASONE) 20 MG tablet Take 1 tablet (20 mg total) by mouth daily. 02/14/18   Norval Gable, MD  tiotropium (SPIRIVA HANDIHALER) 18 MCG inhalation capsule Place 18 mcg into inhaler and inhale daily.  01/30/14   [provider]  traMADol (ULTRAM) 50 MG tablet Take 1 tablet (50 mg total) by mouth every 8 (eight) hours as needed. 10/07/17   Coral Spikes, DO    Allergies Galactose; Gabapentin; and Celecoxib  Family History  Problem Relation Age of Onset  . Stroke Mother   . Atrial fibrillation Mother   . Stroke Father   . Atrial fibrillation Father     Social History Social History   Tobacco Use  . Smoking status: Never Smoker  . Smokeless tobacco: Never Used  Substance Use Topics  . Alcohol use: Yes    Alcohol/week: 0.0 standard drinks    Comment: rare  . Drug use: No    Review of Systems  Constitutional: Negative for fever. Eyes: Negative for visual changes. ENT: Negative for sore throat. Neck: No neck pain  Cardiovascular: Negative for chest pain. Respiratory: Negative for shortness of breath. Gastrointestinal: + Lowre abdominal pain. No vomiting or diarrhea. Genitourinary: Negative for dysuria. + hematuria Musculoskeletal: Negative for back pain. Skin: Negative for rash. Neurological: Negative for headaches, weakness or numbness. Psych: No SI or HI  ____________________________________________   PHYSICAL EXAM:  VITAL SIGNS: ED Triage Vitals  Enc Vitals Group     BP 03/22/18 0752 (!) 152/90     Pulse Rate 03/22/18 0752 79     Resp 03/22/18 0752 18     Temp 03/22/18 0752 97.7 F (36.5 C)     Temp Source 03/22/18 0752 Oral     SpO2 03/22/18 0752 95 %      Weight 03/22/18 0753 175 lb (79.4 kg)     Height 03/22/18 0753 5\' 8"  (1.727 m)     Head Circumference --      Peak Flow --      Pain Score 03/22/18 0752 5     Pain Loc --      Pain Edu? --      Excl. in [place]? --     Constitutional: Alert and oriented. Well appearing and in no apparent distress. HEENT:      Head: Normocephalic and atraumatic.         Eyes: Conjunctivae are normal. Sclera is non-icteric.       Mouth/Throat: Mucous membranes are moist.       Neck: Supple with no signs of meningismus. Cardiovascular: Regular rate and rhythm. No murmurs, gallops, or rubs.  2+ symmetrical distal pulses are present in all extremities. No JVD. Respiratory: Normal respiratory effort. Lungs are clear to auscultation bilaterally. No wheezes, crackles, or rhonchi.  Gastrointestinal: Soft, non tender, and non distended with positive bowel sounds. No rebound or guarding. Genitourinary: No CVA tenderness. Musculoskeletal: Nontender with normal range of motion in all extremities. No edema, cyanosis, or erythema of extremities. Neurologic: Normal speech and language. Face is symmetric. Moving all extremities. No gross focal neurologic deficits are appreciated. Skin: Skin is warm, dry and intact. No rash noted. Psychiatric: Mood and affect are normal. Speech and behavior are normal.  ____________________________________________   LABS (all labs ordered are listed, but only abnormal results are displayed)  Labs Reviewed  CBC WITH DIFFERENTIAL/PLATELET - Abnormal; Notable for the following components:      Result Value   MCV 103.6 (*)    All other components within normal limits  BASIC METABOLIC PANEL - Abnormal; Notable for the following components:   Glucose, Bld 112 (*)    All other components within normal limits  URINALYSIS, COMPLETE (UACMP) WITH MICROSCOPIC - Abnormal; Notable for the following components:   Color, Urine YELLOW (*)    APPearance CLEAR (*)    Hgb urine dipstick LARGE (*)     Protein, ur 30 (*)    RBC / HPF >50 (*)    Bacteria, UA RARE (*)    All other components within normal limits  URINE CULTURE  PROTIME-INR   ____________________________________________  EKG  none  ____________________________________________  RADIOLOGY  I have personally reviewed the images performed during this visit and I agree with the Radiologist's read.   Interpretation by Radiologist:  Ct Renal Stone Study  Result Date: 03/22/2018 CLINICAL DATA:  Hematuria EXAM: CT ABDOMEN AND PELVIS WITHOUT CONTRAST TECHNIQUE: Multidetector CT imaging of the abdomen and pelvis was performed following the standard protocol without IV contrast. COMPARISON:  None. FINDINGS: Lower chest: Bibasilar scarring. No effusions. Heart is mildly enlarged. Hepatobiliary: No focal hepatic abnormality. Small scattered hypodensities in the liver, likely small cysts. No biliary ductal dilatation. Gallbladder unremarkable. Pancreas: No focal abnormality or ductal dilatation. Spleen: No focal abnormality.  Normal size. Adrenals/Urinary Tract: Punctate right UVJ stone, 1-2 mm. Mild right hydronephrosis. No renal or ureteral stones on the left. Adrenal glands and urinary bladder unremarkable. Stomach/Bowel: Large stool burden in the colon. Scattered right colonic diverticula. Stomach and small bowel decompressed. Vascular/Lymphatic: Aortic atherosclerosis. No enlarged abdominal or pelvic lymph nodes. Reproductive: No visible focal abnormality. Other: No free fluid or free air. Musculoskeletal: Diffuse degenerative disc and facet disease. No acute bony abnormality. IMPRESSION: Punctate 1-2 mm right UVJ stone with mild right hydronephrosis. Large stool burden in the colon. Electronically Signed   By: Rolm Baptise M.D.   On: 03/22/2018 08:56     ____________________________________________   PROCEDURES  Procedure(s) performed: None Procedures Critical Care performed:   None ____________________________________________   INITIAL IMPRESSION / ASSESSMENT AND PLAN / ED COURSE  84 y.o. male with a history of atrial fibrillation, CAD on Plavix, prostatectomy for prostate cancer, hypertension who presents for evaluation of hematuria x 2 days and an episode of lower abdominal pain this am.  Patient is hemodynamically stable, abdomen is soft with no tenderness, no flank tenderness.  CT consistent with a 2 mm right UVJ stone with mild hydronephrosis.  Hemoglobin is stable.  UA is pending to rule out overlying infection.  Patient with no evidence of urinary retention.  Clinical Course as of Mar 22 1054  Tue Mar 22, 2018  1054 Patient remains hemodynamically stable.  Hemoglobin stable.  CT showing small kidney stone in the right UVJ.  Patient remains with no pain.  UA with no evidence of infection.  Urine here is clear with no gross hematuria.  No evidence of urinary retention.  Recommend close follow-up with his doctor in 2 days.  Discussed return precautions for recurrence of hematuria or signs of UTI.   [CV]    Clinical Course User Index [CV] Alfred Levins [place], MD     As part of my medical decision making, I reviewed the following data within the [place] History obtained from family, Nursing notes reviewed and incorporated, Labs reviewed , Old chart reviewed, Radiograph reviewed , Notes from prior ED visits and [place] Controlled Substance Database    Pertinent labs & imaging results that were available during my care of the patient were reviewed by me and considered in my medical decision making (see chart for details).    ____________________________________________   FINAL CLINICAL IMPRESSION(S) / ED DIAGNOSES  Final diagnoses:  Hematuria, unspecified type  Kidney stone      NEW MEDICATIONS STARTED DURING THIS VISIT:  ED Discharge Orders    None       Note:  This document was prepared using Dragon voice recognition software  and may include unintentional dictation errors.    Rudene Re, MD 03/22/18 [phone]

## 2018-03-22 NOTE — ED Triage Notes (Signed)
Pt c/o hematuria since Sunday and today having RLQ pain.

## 2018-03-22 NOTE — ED Notes (Signed)
Patient presents to the ED with hematuria since Sunday and right lower quadrant pain today.  Patient reports history of prostatectomy.  Patient is in no obvious distress at this time.

## 2018-03-22 NOTE — ED Notes (Signed)
Patient transported to CT at this time.  Will continue to monitor. 

## 2018-03-23 LAB — URINE CULTURE: Culture: >=10000 — AB

## 2018-03-28 ENCOUNTER — Ambulatory Visit: Payer: Medicare Other | Admitting: Physical Therapy

## 2018-03-28 ENCOUNTER — Encounter: Payer: Self-pay | Admitting: Physical Therapy

## 2018-03-28 DIAGNOSIS — M6281 Muscle weakness (generalized): Secondary | ICD-10-CM | POA: Diagnosis not present

## 2018-03-28 DIAGNOSIS — R2689 Other abnormalities of gait and mobility: Secondary | ICD-10-CM

## 2018-03-28 NOTE — Therapy (Signed)
[place] [facility] SERVICES [address] [place], [place], [address] Phone: [phone]   Fax:  [phone]  Physical Therapy Treatment  Patient Details  Name: Donald Riley MRN: [id number] Date of Birth: 14-Jan-1934 Referring Provider (PT): Hortencia Pilar MD   Encounter Date: 03/28/2018  PT End of Session - 03/28/18 1304    Visit Number  16    Number of Visits  25    Date for PT Re-Evaluation  04/13/18    Authorization Type  6/10 progress note    PT Start Time  3382    PT Stop Time  1345    PT Time Calculation (min)  40 min    Equipment Utilized During Treatment  Gait belt    Activity Tolerance  Patient tolerated treatment well    Behavior During Therapy  WFL for tasks assessed/performed       Past Medical History:  Diagnosis Date  . Allergy to alpha-gal   . Arthritis   . Asthma   . Atrial fibrillation ([place])   . Cancer ([place]) 2002  . GERD (gastroesophageal reflux disease)   . Glaucoma   . Gout   . Heart murmur   . Hypertension   . Neuropathy   . Osteoporosis   . Sleep apnea     Past Surgical History:  Procedure Laterality Date  . ARTHROPLASTY Right    ankle  . BUNIONECTOMY    . CARDIAC CATHETERIZATION N/A 07/02/2015   Procedure: Right/Left Heart Cath and Coronary Angiography;  Surgeon: Teodoro Spray, MD;  Location: [place] [facility];  Service: Cardiovascular;  Laterality: N/A;  . CORONARY/GRAFT ACUTE MI REVASCULARIZATION N/A 10/02/2016   Procedure: Coronary/Graft Acute MI Revascularization;  Surgeon: Wellington Hampshire, MD;  Location: [place] [facility];  Service: Cardiovascular;  Laterality: N/A;  . JOINT REPLACEMENT    . ostatectomy      There were no vitals filed for this visit.  Subjective Assessment - 03/28/18 1321    Subjective  Patient arrived to clinic wearing an SI belt and with reports of pain in his back after visiting the ED for hematuria. Patient isn't sure if the pain started from laying on the  stretcher or the imaging table, but he notes that it feels like a spasm.     Pertinent History  84 yo Male reports increased weakness in BLE over last 6 months. Patient has a history of chronic low back pain with radicular symptoms. He is s/p L1/L2, L5/S1 non-surgical fusion with degenerative disc disease. In addition CT of 2017 shows a moderate-severe central spinal stenosis and severe facet disease. He denies any surgery for lumbar spine; He reports spine center reports that he is not a surgical candidate as the spine is too severe.  He has been to a chiropractor in the past with no relief. Patient also has a diagnosis of polyneuropathy which could be contributing to his symptoms. He is ambulating with a SPC. He will be following up with spine surgeon and possibly get steroid injection. He has had steriod injections in the past with mixed results.     Limitations  Other (comment)    How long can you sit comfortably?  NA    How long can you stand comfortably?  20-30 min, he reports increased numbness in legs after a few minutes    How long can you walk comfortably?  He walks with cane/walker, takes longer time, able to walk >500 feet; has a harder time  walking downhill    Diagnostic tests  MRI shows multiple lumbar segment stenosis/facet degeneration    Currently in Pain?  Yes    Pain Score  1     Pain Location  Back    Pain Orientation  Lower;Right    Pain Descriptors / Indicators  Aching;Spasm    Pain Type  Acute pain       TREATMENT  Manual Interventions R hip long axis distraction 30 sec x 3 bouts R inominate posterior glide 15 sec x 3 bouts L inominate anterior glide 15 sec x 3 bouts Lumbar paraspinal massage  Patient reported decreased discomfort and stiffness after manual intervention.  Ther-ex Leg press: BLE plate 90# O24; 235# T61 RLE only plate 75# W43  Supine: TrA activation 3 sec x10  TrA activation w/ march 4x each leg Hooklying trunk rotations 4x5 in between sets of  other exercises Single knee to chest 20 sec x4 TrA activation w/ bent knee fall out x5 each side    PT Education - 03/28/18 1324    Education Details  exercise technique, trunk strengthening for balance    Person(s) Educated  Patient    Methods  Explanation;Demonstration;Verbal cues    Comprehension  Verbalized understanding;Need further instruction;Returned demonstration       PT Short Term Goals - 03/02/18 1323      PT SHORT TERM GOAL #1   Title  Patient will be adherent to HEP at least 3x a week to improve functional strength and balance for better safety at home.    Baseline  Performing exercises everyday    Time  4    Period  Weeks    Status  Achieved      PT SHORT TERM GOAL #2   Title  Patient will be able to transfer sit<>Stand from a regular height chair with 1 UE assist or less, independent to improve ability to get up out of chairs/couch.    Time  4    Period  Weeks    Status  Achieved        PT Long Term Goals - 03/02/18 1324      PT LONG TERM GOAL #1   Title  Patient will increase BLE gross strength to 4+/5 as to improve functional strength for independent gait, increased standing tolerance and increased ADL ability.    Time  8    Period  Weeks    Status  Partially Met      PT LONG TERM GOAL #2   Title  Patient will increase 10 meter walk test to >1.35ms as to improve gait speed for better community ambulation and to reduce fall risk.    Time  8    Period  Weeks    Status  Partially Met      PT LONG TERM GOAL #3   Title  Patient (> 656years old) will complete five times sit to stand test in < 15 seconds indicating an increased LE strength and improved balance.    Time  8    Period  Weeks    Status  Partially Met      PT LONG TERM GOAL #4   Title  Patient will be mod I when negotiating a curb with LRAD to exhibit improved gait ability and safety in community.     Time  8    Period  Weeks    Status  Partially Met            Plan -  03/29/18  0805    Clinical Impression Statement  Patient presents to clinic with increased back pain and discomfort with increased forward and left lateral flexion during ambulation 2/2 to prolonged positioning for imaging and transport to the ED on 03/22/2018. Patient was treated for kidney stones and hematuria at that time. Today's session focused on trunk strengthening for decreased back pain and improved posture in weight-bearing positions. Patient demonstrates ability to engage lower abdominals for short duration, but requires increased time between repetitions for continued engagement of the lower abdominals. Patient was able to tolerate increased resistance for leg press with no compensatory patterns noted. patient will continue to benefit from skilled therapeutic intervention to address deficits in strength, balance, mobility, and safety for improved QOL.    Rehab Potential  Fair    Clinical Impairments Affecting Rehab Potential  motivated; concerned about progressive stenosis with increased numbness in legs;     PT Frequency  2x / week    PT Duration  4 weeks    PT Treatment/Interventions  Aquatic Therapy;Cryotherapy;Electrical Stimulation;Moist Heat;Therapeutic exercise;Therapeutic activities;Functional mobility training;Stair training;Gait training;Balance training;Neuromuscular re-education;Patient/family education;Orthotic Fit/Training;Manual techniques;Energy conservation    PT Next Visit Plan  Advance HEP, progress resistance and strength training    PT Home Exercise Plan  seated marching, hamstring curls, and hip abduction with green tband; trunk rotation and single knee to chest in AM and PM, Standing mini squats    Consulted and Agree with Plan of Care  Patient       Patient will benefit from skilled therapeutic intervention in order to improve the following deficits and impairments:  Decreased endurance, Decreased activity tolerance, Decreased strength, Pain, Decreased balance, Decreased  mobility, Difficulty walking, Postural dysfunction, Impaired flexibility, Decreased safety awareness  Visit Diagnosis: Muscle weakness (generalized)  Other abnormalities of gait and mobility     Problem List Patient Active Problem List   Diagnosis Date Noted  . Acute ST elevation myocardial infarction (STEMI) of anterolateral wall ([place])   . Angio-edema   . Nonsustained ventricular tachycardia ([place])   . Acute respiratory failure ([place])   . Airway obstruction   . Macroglossia   . Non-STEMI (non-ST elevated myocardial infarction) ([place])   . Anaphylaxis 09/30/2016   Myles Gip PT, DPT [phone] 03/29/2018, 8:15 AM  [place] [facility] SERVICES [address] [place], [place], [address] Phone: [phone]   Fax:  [phone]  Name: HARRIET BOLLEN MRN: [id number] Date of Birth: 03-24-1934

## 2018-03-31 ENCOUNTER — Encounter: Payer: Self-pay | Admitting: Physical Therapy

## 2018-03-31 ENCOUNTER — Ambulatory Visit: Payer: Medicare Other | Attending: Family Medicine | Admitting: Physical Therapy

## 2018-03-31 DIAGNOSIS — R2689 Other abnormalities of gait and mobility: Secondary | ICD-10-CM | POA: Insufficient documentation

## 2018-03-31 DIAGNOSIS — M6281 Muscle weakness (generalized): Secondary | ICD-10-CM | POA: Insufficient documentation

## 2018-03-31 NOTE — Therapy (Signed)
[place] MAIN [facility] SERVICES [address] [place], [place], [address] Phone: [phone]   Fax:  [phone]  Physical Therapy Treatment  Patient Details  Name: Donald Riley MRN: [id number] Date of Birth: 1934/01/15 Referring Provider (PT): Hortencia Pilar MD   Encounter Date: 03/31/2018  PT End of Session - 03/31/18 1313    Visit Number  17    Number of Visits  25    Date for PT Re-Evaluation  04/13/18    Authorization Type  7/10 progress note    PT Start Time  1302    PT Stop Time  1345    PT Time Calculation (min)  43 min    Equipment Utilized During Treatment  Gait belt    Activity Tolerance  Patient tolerated treatment well    Behavior During Therapy  WFL for tasks assessed/performed       Past Medical History:  Diagnosis Date  . Allergy to alpha-gal   . Arthritis   . Asthma   . Atrial fibrillation ([place])   . Cancer ([place]) 2002  . GERD (gastroesophageal reflux disease)   . Glaucoma   . Gout   . Heart murmur   . Hypertension   . Neuropathy   . Osteoporosis   . Sleep apnea     Past Surgical History:  Procedure Laterality Date  . ARTHROPLASTY Right    ankle  . BUNIONECTOMY    . CARDIAC CATHETERIZATION N/A 07/02/2015   Procedure: Right/Left Heart Cath and Coronary Angiography;  Surgeon: Teodoro Spray, MD;  Location: [place] [facility];  Service: Cardiovascular;  Laterality: N/A;  . CORONARY/GRAFT ACUTE MI REVASCULARIZATION N/A 10/02/2016   Procedure: Coronary/Graft Acute MI Revascularization;  Surgeon: Wellington Hampshire, MD;  Location: [place] [facility];  Service: Cardiovascular;  Laterality: N/A;  . JOINT REPLACEMENT    . ostatectomy      There were no vitals filed for this visit.  Subjective Assessment - 03/31/18 1311    Subjective  Patient reports increased low back pain over last week as a result of exacerbation from treatment for kidney stone; Reports that the catch is better but he still has pain with  standing/walking;     Pertinent History  85 yo Male reports increased weakness in BLE over last 6 months. Patient has a history of chronic low back pain with radicular symptoms. He is s/p L1/L2, L5/S1 non-surgical fusion with degenerative disc disease. In addition CT of 2017 shows a moderate-severe central spinal stenosis and severe facet disease. He denies any surgery for lumbar spine; He reports spine center reports that he is not a surgical candidate as the spine is too severe.  He has been to a chiropractor in the past with no relief. Patient also has a diagnosis of polyneuropathy which could be contributing to his symptoms. He is ambulating with a SPC. He will be following up with spine surgeon and possibly get steroid injection. He has had steriod injections in the past with mixed results.     Limitations  Other (comment)    How long can you sit comfortably?  NA    How long can you stand comfortably?  20-30 min, he reports increased numbness in legs after a few minutes    How long can you walk comfortably?  He walks with cane/walker, takes longer time, able to walk >500 feet; has a harder time walking downhill    Diagnostic tests  MRI shows multiple lumbar segment stenosis/facet degeneration  Currently in Pain?  Yes    Pain Score  3     Pain Location  Back    Pain Orientation  Right;Left;Lower    Pain Descriptors / Indicators  Aching;Spasm    Pain Type  Acute pain    Pain Onset  In the past 7 days    Pain Frequency  Intermittent    Aggravating Factors   worse with standing/walking    Pain Relieving Factors  sitting/rest    Effect of Pain on Daily Activities  unable to stand for long periods of time;     Multiple Pain Sites  No            TREATMENT  Patient in hooklying: PT applied moist heat to low back concurrent with exercise for better tolerance  Supine: TrA activation 5 sec hold x5 reps x2 sets each Hooklying trunk rotations 4x5 in between sets of other  exercises Single knee to chest 20 sec x4 bilaterally TrA activation with BLE hip abduction/ER green tband x15 bilaterally; TrA activation w/ march green tband around BLE x10 bilaterally; Bridges with arms across chest x10 reps with min VCs to avoid painful ROM for better tolerance;  Patient required min-moderate verbal/tactile cues for correct exercise technique and to increase core abdominal stabilization with UE/LE movement   Sidelying: Red tband clamshells x15 bilaterally with min VCs for positioning to improve hip abductor strengthening without trunk rotation; Open book stretch x5 reps, required min VCs to avoid painful ROM.   Sitting on pball: pelvic rocks anterior/posterior x5 reps with mod VCs for positioning and exercise technique; initially patient exhibits more trunk motion and less pelvic activation but with increased instruction was able to exhibit improved pelvic rotation and stretch;  Following exercise instructed patient in gait around gym x80 feet with SPC; he is able to exhibit better step length and posture, however is still walking with slower pace.              PT Education - 03/31/18 1312    Education Details  exercise technique, trunk strengthening for balance/postural re-education    Person(s) Educated  Patient    Methods  Explanation;Demonstration;Verbal cues    Comprehension  Verbalized understanding;Returned demonstration;Verbal cues required;Need further instruction       PT Short Term Goals - 03/02/18 1323      PT SHORT TERM GOAL #1   Title  Patient will be adherent to HEP at least 3x a week to improve functional strength and balance for better safety at home.    Baseline  Performing exercises everyday    Time  4    Period  Weeks    Status  Achieved      PT SHORT TERM GOAL #2   Title  Patient will be able to transfer sit<>Stand from a regular height chair with 1 UE assist or less, independent to improve ability to get up out of chairs/couch.     Time  4    Period  Weeks    Status  Achieved        PT Long Term Goals - 03/02/18 1324      PT LONG TERM GOAL #1   Title  Patient will increase BLE gross strength to 4+/5 as to improve functional strength for independent gait, increased standing tolerance and increased ADL ability.    Time  8    Period  Weeks    Status  Partially Met      PT LONG TERM GOAL #2  Title  Patient will increase 10 meter walk test to >1.27ms as to improve gait speed for better community ambulation and to reduce fall risk.    Time  8    Period  Weeks    Status  Partially Met      PT LONG TERM GOAL #3   Title  Patient (> 625years old) will complete five times sit to stand test in < 15 seconds indicating an increased LE strength and improved balance.    Time  8    Period  Weeks    Status  Partially Met      PT LONG TERM GOAL #4   Title  Patient will be mod I when negotiating a curb with LRAD to exhibit improved gait ability and safety in community.     Time  8    Period  Weeks    Status  Partially Met            Plan - 03/31/18 1418    Clinical Impression Statement  Patient continues to have low back pain exacerbation from recent ED visit. PT instructed patient in lumbar flexion ROM exercise concurrent with moist heat to low back to reduce stiffness. He denies any tenderness to palpation of lumbar paraspinals. Patient tolerated exercise well reporting no pain at end of session. Reinforced HEP with instruction to focus on hooklying exercise and hold off on prolonged standing until exacerbation passes. Patient verbalized understanding. He would benefit from additional skilled PT Intervention to improve ROM and reduce pain with ADLs.    Rehab Potential  Fair    Clinical Impairments Affecting Rehab Potential  motivated; concerned about progressive stenosis with increased numbness in legs;     PT Frequency  2x / week    PT Duration  4 weeks    PT Treatment/Interventions  Aquatic  Therapy;Cryotherapy;Electrical Stimulation;Moist Heat;Therapeutic exercise;Therapeutic activities;Functional mobility training;Stair training;Gait training;Balance training;Neuromuscular re-education;Patient/family education;Orthotic Fit/Training;Manual techniques;Energy conservation    PT Next Visit Plan  Advance HEP, progress resistance and strength training    PT Home Exercise Plan  seated marching, hamstring curls, and hip abduction with green tband; trunk rotation and single knee to chest in AM and PM, Standing mini squats    Consulted and Agree with Plan of Care  Patient       Patient will benefit from skilled therapeutic intervention in order to improve the following deficits and impairments:  Decreased endurance, Decreased activity tolerance, Decreased strength, Pain, Decreased balance, Decreased mobility, Difficulty walking, Postural dysfunction, Impaired flexibility, Decreased safety awareness  Visit Diagnosis: Muscle weakness (generalized)  Other abnormalities of gait and mobility     Problem List Patient Active Problem List   Diagnosis Date Noted  . Acute ST elevation myocardial infarction (STEMI) of anterolateral wall ([facility]   . Angio-edema   . Nonsustained ventricular tachycardia ([place]   . Acute respiratory failure ([place]   . Airway obstruction   . Macroglossia   . Non-STEMI (non-ST elevated myocardial infarction) ([place]   . Anaphylaxis 09/30/2016    , PT, DPT 03/31/2018, 2:20 PM  [place] [facility] [address] [address]Phone: [phone]  Fax:  [phone] Name: Donald REWERTSMRN: [id number] of Birth: [birth year]

## 2018-04-04 ENCOUNTER — Ambulatory Visit: Payer: Medicare Other | Admitting: Physical Therapy

## 2018-04-05 ENCOUNTER — Encounter: Payer: Self-pay | Admitting: Physical Therapy

## 2018-04-05 ENCOUNTER — Ambulatory Visit: Payer: Medicare Other | Admitting: Physical Therapy

## 2018-04-05 DIAGNOSIS — M6281 Muscle weakness (generalized): Secondary | ICD-10-CM | POA: Diagnosis not present

## 2018-04-05 DIAGNOSIS — R2689 Other abnormalities of gait and mobility: Secondary | ICD-10-CM

## 2018-04-05 NOTE — Therapy (Signed)
[place] MAIN [facility] SERVICES [address] [place], [place], [address] Phone: [phone]   Fax:  [phone]  Physical Therapy Treatment  Patient Details  Name: Donald Riley MRN: [id number] Date of Birth: 09-29-1933 Referring Provider (PT): Hortencia Pilar MD   Encounter Date: 04/05/2018  PT End of Session - 04/05/18 1313    Visit Number  18    Number of Visits  25    Date for PT Re-Evaluation  04/13/18    Authorization Type  8/10 progress note    PT Start Time  1302    PT Stop Time  1345    PT Time Calculation (min)  43 min    Equipment Utilized During Treatment  Gait belt    Activity Tolerance  Patient tolerated treatment well    Behavior During Therapy  WFL for tasks assessed/performed       Past Medical History:  Diagnosis Date  . Allergy to alpha-gal   . Arthritis   . Asthma   . Atrial fibrillation ([place])   . Cancer ([place]) 2002  . GERD (gastroesophageal reflux disease)   . Glaucoma   . Gout   . Heart murmur   . Hypertension   . Neuropathy   . Osteoporosis   . Sleep apnea     Past Surgical History:  Procedure Laterality Date  . ARTHROPLASTY Right    ankle  . BUNIONECTOMY    . CARDIAC CATHETERIZATION N/A 07/02/2015   Procedure: Right/Left Heart Cath and Coronary Angiography;  Surgeon: Teodoro Spray, MD;  Location: [place] [facility];  Service: Cardiovascular;  Laterality: N/A;  . CORONARY/GRAFT ACUTE MI REVASCULARIZATION N/A 10/02/2016   Procedure: Coronary/Graft Acute MI Revascularization;  Surgeon: Wellington Hampshire, MD;  Location: [place] [facility];  Service: Cardiovascular;  Laterality: N/A;  . JOINT REPLACEMENT    . ostatectomy      There were no vitals filed for this visit.  Subjective Assessment - 04/05/18 1311    Subjective  Patient reports less back pain but reports increased numbness in both feet which is limiting his walking. He reports, "My walking is really impaired." He presents to therapy with [facility]; he  reports that he is still using the cane despite impairment with walking;     Pertinent History  85 yo Male reports increased weakness in BLE over last 6 months. Patient has a history of chronic low back pain with radicular symptoms. He is s/p L1/L2, L5/S1 non-surgical fusion with degenerative disc disease. In addition CT of 2017 shows a moderate-severe central spinal stenosis and severe facet disease. He denies any surgery for lumbar spine; He reports spine center reports that he is not a surgical candidate as the spine is too severe.  He has been to a chiropractor in the past with no relief. Patient also has a diagnosis of polyneuropathy which could be contributing to his symptoms. He is ambulating with a SPC. He will be following up with spine surgeon and possibly get steroid injection. He has had steriod injections in the past with mixed results.     Limitations  Other (comment)    How long can you sit comfortably?  NA    How long can you stand comfortably?  20-30 min, he reports increased numbness in legs after a few minutes    How long can you walk comfortably?  He walks with cane/walker, takes longer time, able to walk >500 feet; has a harder time walking downhill  Diagnostic tests  MRI shows multiple lumbar segment stenosis/facet degeneration    Currently in Pain?  Yes    Pain Score  2     Pain Location  Back    Pain Orientation  Lower    Pain Descriptors / Indicators  Aching;Sore    Pain Type  Chronic pain    Pain Onset  More than a month ago    Pain Frequency  Intermittent    Aggravating Factors   worse with prolonged standing/walkin    Pain Relieving Factors  rest/sitting    Effect of Pain on Daily Activities  unable to stand long periods of time    Multiple Pain Sites  No              TREATMENT  Patient in hooklying: PT applied moist heat to low back concurrent with exercise for better tolerance  Supine: TrA activation 5 sec hold x5 reps x3 sets each Hooklying trunk  rotations x10 reps bilaterally with cues to slow down LE movement for better stretch; Single knee to chest 20 sec x3 bilaterally TrA activation w/ march green tband around BLE x15 bilaterally; Patient required min-moderate verbal/tactile cues for correct exercise technique and to increase core abdominal stabilization with UE/LE movement  Sidelying: Green tband clamshells x15 bilaterally with min VCs for positioning to improve hip abductor strengthening without trunk rotation; Upon sitting, patient did report slight discomfort in low back. He reports this "as a catch" when moving.  Instructed patient in sidelying position: (Unable to lay prone) Neural gapping joint mobilization 10 sec bouts x3 sets, grade II at L3;  Patient reports no pain/catch following joint mobilization;   Balance: Standing in parallel bars: Standing on 1/2 bolster (flat side up) -heel/toe rock x15 reps -tandem stance with 2-0 rail assist 10 sec hold x2 reps each LE -feet in neutral with alternate UE lift x10 reps Requires min A for safety; Patient required min VCs for balance stability, including to increase trunk control for less loss of balance with smaller base of support  Standing on 1/2 bolster (round side up) -BLE heel raises x10 x2 sets Following exercise, instructed patient to increase heel raise during toe off for better step length; able to exhibit better heel/toe walking but reports that he has to make a conscious effort.                  PT Education - 04/05/18 1313    Education Details  lumbar flexion exercise, strengthening and balance    Person(s) Educated  Patient    Methods  Explanation;Demonstration;Verbal cues    Comprehension  Verbalized understanding;Returned demonstration;Verbal cues required;Need further instruction       PT Short Term Goals - 03/02/18 1323      PT SHORT TERM GOAL #1   Title  Patient will be adherent to HEP at least 3x a week to improve functional  strength and balance for better safety at home.    Baseline  Performing exercises everyday    Time  4    Period  Weeks    Status  Achieved      PT SHORT TERM GOAL #2   Title  Patient will be able to transfer sit<>Stand from a regular height chair with 1 UE assist or less, independent to improve ability to get up out of chairs/couch.    Time  4    Period  Weeks    Status  Achieved        PT  Long Term Goals - 03/02/18 1324      PT LONG TERM GOAL #1   Title  Patient will increase BLE gross strength to 4+/5 as to improve functional strength for independent gait, increased standing tolerance and increased ADL ability.    Time  8    Period  Weeks    Status  Partially Met      PT LONG TERM GOAL #2   Title  Patient will increase 10 meter walk test to >1.77ms as to improve gait speed for better community ambulation and to reduce fall risk.    Time  8    Period  Weeks    Status  Partially Met      PT LONG TERM GOAL #3   Title  Patient (> 665years old) will complete five times sit to stand test in < 15 seconds indicating an increased LE strength and improved balance.    Time  8    Period  Weeks    Status  Partially Met      PT LONG TERM GOAL #4   Title  Patient will be mod I when negotiating a curb with LRAD to exhibit improved gait ability and safety in community.     Time  8    Period  Weeks    Status  Partially Met            Plan - 04/05/18 1559    Clinical Impression Statement  Patient reports less back pain today overall. tolerated session well. He reports less numbness/tingling in feet with hooklying exercise. Instructed patient in advanced balance exercise utilizing compliant surfaces to challenge ankle strategies and stance control. Patient does require min A for safety with advanced balance challenge. He would benefit from additional skilled PT Intervention to improve strength, balance and gait safety;     Rehab Potential  Fair    Clinical Impairments Affecting Rehab  Potential  motivated; concerned about progressive stenosis with increased numbness in legs;     PT Frequency  2x / week    PT Duration  4 weeks    PT Treatment/Interventions  Aquatic Therapy;Cryotherapy;Electrical Stimulation;Moist Heat;Therapeutic exercise;Therapeutic activities;Functional mobility training;Stair training;Gait training;Balance training;Neuromuscular re-education;Patient/family education;Orthotic Fit/Training;Manual techniques;Energy conservation    PT Next Visit Plan  Advance HEP, progress resistance and strength training    PT Home Exercise Plan  seated marching, hamstring curls, and hip abduction with green tband; trunk rotation and single knee to chest in AM and PM, Standing mini squats    Consulted and Agree with Plan of Care  Patient       Patient will benefit from skilled therapeutic intervention in order to improve the following deficits and impairments:  Decreased endurance, Decreased activity tolerance, Decreased strength, Pain, Decreased balance, Decreased mobility, Difficulty walking, Postural dysfunction, Impaired flexibility, Decreased safety awareness  Visit Diagnosis: Muscle weakness (generalized)  Other abnormalities of gait and mobility     Problem List Patient Active Problem List   Diagnosis Date Noted  . Acute ST elevation myocardial infarction (STEMI) of anterolateral wall ([place]   . Angio-edema   . Nonsustained ventricular tachycardia ([place]   . Acute respiratory failure ([place]   . Airway obstruction   . Macroglossia   . Non-STEMI (non-ST elevated myocardial infarction) ([place]   . Anaphylaxis 09/30/2016    Zoiee Wimmer PT, DPT 04/05/2018, 4:00 PM  [place] [facility] [address] [address]Phone: [phone]  Fax:  [phone] Name:  Donald Riley MRN: [id number] Date of Birth: [birth year]

## 2018-04-06 ENCOUNTER — Ambulatory Visit: Payer: Medicare Other | Admitting: Physical Therapy

## 2018-04-07 ENCOUNTER — Ambulatory Visit: Payer: Medicare Other

## 2018-04-07 DIAGNOSIS — M6281 Muscle weakness (generalized): Secondary | ICD-10-CM | POA: Diagnosis not present

## 2018-04-07 DIAGNOSIS — R2689 Other abnormalities of gait and mobility: Secondary | ICD-10-CM

## 2018-04-07 NOTE — Therapy (Signed)
[place] MAIN [facility] SERVICES [address] [place], [place], [address] Phone: [phone]   Fax:  [phone]  Physical Therapy Treatment  Patient Details  Name: Donald Riley MRN: [id number] Date of Birth: 06/20/1933 Referring Provider (PT): Hortencia Pilar MD   Encounter Date: 04/07/2018  PT End of Session - 04/07/18 1258    Visit Number  19    Number of Visits  25    Date for PT Re-Evaluation  04/13/18    Authorization Type  9/10 progress note    PT Start Time  1300    PT Stop Time  1344    PT Time Calculation (min)  44 min    Equipment Utilized During Treatment  Gait belt    Activity Tolerance  Patient tolerated treatment well    Behavior During Therapy  WFL for tasks assessed/performed       Past Medical History:  Diagnosis Date  . Allergy to alpha-gal   . Arthritis   . Asthma   . Atrial fibrillation ([place])   . Cancer ([place]) 2002  . GERD (gastroesophageal reflux disease)   . Glaucoma   . Gout   . Heart murmur   . Hypertension   . Neuropathy   . Osteoporosis   . Sleep apnea     Past Surgical History:  Procedure Laterality Date  . ARTHROPLASTY Right    ankle  . BUNIONECTOMY    . CARDIAC CATHETERIZATION N/A 07/02/2015   Procedure: Right/Left Heart Cath and Coronary Angiography;  Surgeon: Teodoro Spray, MD;  Location: [place] [facility];  Service: Cardiovascular;  Laterality: N/A;  . CORONARY/GRAFT ACUTE MI REVASCULARIZATION N/A 10/02/2016   Procedure: Coronary/Graft Acute MI Revascularization;  Surgeon: Wellington Hampshire, MD;  Location: [place] [facility];  Service: Cardiovascular;  Laterality: N/A;  . JOINT REPLACEMENT    . ostatectomy      There were no vitals filed for this visit.  Subjective Assessment - 04/07/18 1307    Subjective  Pt reported that overall he is happy with his PT. Decreased pain today, numbness in both feet seem to be less as well.     Pertinent History  85 yo Male reports increased weakness in  BLE over last 6 months. Patient has a history of chronic low back pain with radicular symptoms. He is Donald/p L1/L2, L5/S1 non-surgical fusion with degenerative disc disease. In addition CT of 2017 shows a moderate-severe central spinal stenosis and severe facet disease. He denies any surgery for lumbar spine; He reports spine center reports that he is not a surgical candidate as the spine is too severe.  He has been to a chiropractor in the past with no relief. Patient also has a diagnosis of polyneuropathy which could be contributing to his symptoms. He is ambulating with a SPC. He will be following up with spine surgeon and possibly get steroid injection. He has had steriod injections in the past with mixed results.     Limitations  Other (comment)    How long can you sit comfortably?  NA    How long can you stand comfortably?  20-30 min, he reports increased numbness in legs after a few minutes    How long can you walk comfortably?  He walks with cane/walker, takes longer time, able to walk >500 feet; has a harder time walking downhill    Diagnostic tests  MRI shows multiple lumbar segment stenosis/facet degeneration    Patient Stated Goals  Improve walking,  increase strength in legs, feeling more steady;     Currently in Pain?  Yes    Pain Score  1     Pain Location  Back    Pain Orientation  Lower    Pain Descriptors / Indicators  Aching    Pain Type  Chronic pain    Pain Onset  More than a month ago     Treatment:   Patient in hooklying: PT applied moist heat to low back concurrent with exercise for better tolerance   Therapeutic exercise: Supine: TrA activation 5 sec hold x5 reps x3 sets each Hooklying trunk rotations 10secx10 reps bilaterally with cues to slow down LE movement for better stretch; Single knee to chest 20 sec x3 bilaterally  TrA activation w/ march green tband around BLE x15 bilaterally; Patient required min-moderate verbal/tactile cues for correct exercise technique and  to increase core abdominal stabilization with UE/LE movement   Sidelying: Green tband clamshells x15 bilaterally with min VCs for positioning to improve hip abductor strengthening without trunk rotation; Upon sitting, patient did report slight discomfort in low back. He reports this "as a catch" when moving.  Instructed patient in sidelying position: (Unable to lay prone) sidelying lumbar mobs grade II-III (sidelying on L) 5x10sec bouts    Balance: Standing in parallel bars: Standing on 1/2 bolster (flat side up) -heel/toe rock x15 reps -tandem stance with 2-0 rail assist 10 sec hold x2 reps each LE  Requires min A for safety; Patient required min VCs for balance stability, including to increase trunk control for less loss of balance with smaller base of support   pregait activities/gait training: In // bars, one step forward, posterior/anterior weight shifts for appropriate heel lift off and TKE to for pregait training, x12-15 bilaterally, verbal and tactile cues occasional visual.  Pt ambulated ~145f with CGA and SPC, constant cueing for bilateral heel strike in stance, stride length and gait velocity.  Pt response to treatment: Pt reported improved pain and bilateral foot numbness after supine and sidelying activities. Challenged most by TKE in stance for upright posture and to prevent knee buckling, fair carryover at end of session. At end of session pt reported some return of R foot numbness but overall felt improved from start.     PT Education - 04/07/18 1308    Education Details  lumbar flexion exercises, balance techniques    Person(Donald) Educated  Patient    Methods  Explanation;Demonstration;Verbal cues    Comprehension  Verbalized understanding;Returned demonstration       PT Short Term Goals - 03/02/18 1323      PT SHORT TERM GOAL #1   Title  Patient will be adherent to HEP at least 3x a week to improve functional strength and balance for better safety at home.     Baseline  Performing exercises everyday    Time  4    Period  Weeks    Status  Achieved      PT SHORT TERM GOAL #2   Title  Patient will be able to transfer sit<>Stand from a regular height chair with 1 UE assist or less, independent to improve ability to get up out of chairs/couch.    Time  4    Period  Weeks    Status  Achieved        PT Long Term Goals - 03/02/18 1324      PT LONG TERM GOAL #1   Title  Patient will increase BLE gross strength to  4+/5 as to improve functional strength for independent gait, increased standing tolerance and increased ADL ability.    Time  8    Period  Weeks    Status  Partially Met      PT LONG TERM GOAL #2   Title  Patient will increase 10 meter walk test to >1.32ms as to improve gait speed for better community ambulation and to reduce fall risk.    Time  8    Period  Weeks    Status  Partially Met      PT LONG TERM GOAL #3   Title  Patient (> 635years old) will complete five times sit to stand test in < 15 seconds indicating an increased LE strength and improved balance.    Time  8    Period  Weeks    Status  Partially Met      PT LONG TERM GOAL #4   Title  Patient will be mod I when negotiating a curb with LRAD to exhibit improved gait ability and safety in community.     Time  8    Period  Weeks    Status  Partially Met            Plan - 04/07/18 1434    Clinical Impression Statement  The patient was most challenged by gait training today to optimize gait, safety, and mobility. CGA for ambulation with SPC. Overall the pt continued to report improvement in symptoms with flexion based exercises for LBP and numbness. The patient would benefit from further skilled PT to continue to progress program, assess response and optimize function.    Rehab Potential  Fair    Clinical Impairments Affecting Rehab Potential  motivated; concerned about progressive stenosis with increased numbness in legs;     PT Frequency  2x / week    PT  Duration  4 weeks    PT Treatment/Interventions  Aquatic Therapy;Cryotherapy;Electrical Stimulation;Moist Heat;Therapeutic exercise;Therapeutic activities;Functional mobility training;Stair training;Gait training;Balance training;Neuromuscular re-education;Patient/family education;Orthotic Fit/Training;Manual techniques;Energy conservation    PT Next Visit Plan  Advance HEP, progress resistance and strength training    PT Home Exercise Plan  seated marching, hamstring curls, and hip abduction with green tband; trunk rotation and single knee to chest in AM and PM, Standing mini squats    Consulted and Agree with Plan of Care  Patient       Patient will benefit from skilled therapeutic intervention in order to improve the following deficits and impairments:  Decreased endurance, Decreased activity tolerance, Decreased strength, Pain, Decreased balance, Decreased mobility, Difficulty walking, Postural dysfunction, Impaired flexibility, Decreased safety awareness  Visit Diagnosis: Muscle weakness (generalized)  Other abnormalities of gait and mobility     Problem List Patient Active Problem List   Diagnosis Date Noted  . Acute ST elevation myocardial infarction (STEMI) of anterolateral wall ([place]   . Angio-edema   . Nonsustained ventricular tachycardia ([place]   . Acute respiratory failure ([place]   . Airway obstruction   . Macroglossia   . Non-STEMI (non-ST elevated myocardial infarction) ([place]   . Anaphylaxis 09/30/2016    DLieutenant DiegoPT, DPT 2:40 PM,04/07/18 [address] [facility] [address] [address]Phone: [phone]  Fax:  [phone] Name: SJAMEON DELLERMRN: [id number] of Birth: 4Jul 18, 1935

## 2018-04-14 ENCOUNTER — Ambulatory Visit: Payer: Medicare Other | Admitting: Physical Therapy

## 2018-04-14 ENCOUNTER — Encounter: Payer: Self-pay | Admitting: Physical Therapy

## 2018-04-14 DIAGNOSIS — R2689 Other abnormalities of gait and mobility: Secondary | ICD-10-CM

## 2018-04-14 DIAGNOSIS — M6281 Muscle weakness (generalized): Secondary | ICD-10-CM

## 2018-04-14 NOTE — Therapy (Signed)
[place] MAIN [place] [address] [place], [place], [address] Phone: [phone]   Fax:  [phone]  Physical Therapy Treatment Physical Therapy Progress Note   Dates of reporting period  03/02/18  to   04/14/18   Patient Details  Name: Donald Riley MRN: [id number] Date of Birth: 08-23-1933 Referring Provider (PT): Hortencia Pilar MD   Encounter Date: 04/14/2018  PT End of Session - 04/14/18 1158    Visit Number  20    Number of Visits  33    Date for PT Re-Evaluation  05/12/18    Authorization Type  10/10 progress note    PT Start Time  1146    PT Stop Time  1230    PT Time Calculation (min)  44 min    Equipment Utilized During Treatment  Gait belt    Activity Tolerance  Patient tolerated treatment well    Behavior During Therapy  WFL for tasks assessed/performed       Past Medical History:  Diagnosis Date  . Allergy to alpha-gal   . Arthritis   . Asthma   . Atrial fibrillation ([place])   . Cancer ([place]) 2002  . GERD (gastroesophageal reflux disease)   . Glaucoma   . Gout   . Heart murmur   . Hypertension   . Neuropathy   . Osteoporosis   . Sleep apnea     Past Surgical History:  Procedure Laterality Date  . ARTHROPLASTY Right    ankle  . BUNIONECTOMY    . CARDIAC CATHETERIZATION N/A 07/02/2015   Procedure: Right/Left Heart Cath and Coronary Angiography;  Surgeon: Teodoro Spray, MD;  Location: [facility];  Service: Cardiovascular;  Laterality: N/A;  . CORONARY/GRAFT ACUTE MI REVASCULARIZATION N/A 10/02/2016   Procedure: Coronary/Graft Acute MI Revascularization;  Surgeon: Wellington Hampshire, MD;  Location: [place] [facility];  Service: Cardiovascular;  Laterality: N/A;  . JOINT REPLACEMENT    . ostatectomy      There were no vitals filed for this visit.  Subjective Assessment - 04/14/18 1156    Subjective  Patient reports that he is doing well; reports minimal pain currently but did have pain this  morning when he woke up.     Pertinent History  85 yo Male reports increased weakness in BLE over last 6 months. Patient has a history of chronic low back pain with radicular symptoms. He is s/p L1/L2, L5/S1 non-surgical fusion with degenerative disc disease. In addition CT of 2017 shows a moderate-severe central spinal stenosis and severe facet disease. He denies any surgery for lumbar spine; He reports spine center reports that he is not a surgical candidate as the spine is too severe.  He has been to a chiropractor in the past with no relief. Patient also has a diagnosis of polyneuropathy which could be contributing to his symptoms. He is ambulating with a SPC. He will be following up with spine surgeon and possibly get steroid injection. He has had steriod injections in the past with mixed results.     Limitations  Other (comment)    How long can you sit comfortably?  NA    How long can you stand comfortably?  20-30 min, he reports increased numbness in legs after a few minutes    How long can you walk comfortably?  He walks with cane/walker, takes longer time, able to walk >500 feet; has a harder time walking downhill    Diagnostic tests  MRI shows multiple lumbar segment stenosis/facet degeneration    Patient Stated Goals  Improve walking, increase strength in legs, feeling more steady;     Currently in Pain?  Yes    Pain Score  1     Pain Location  Back    Pain Orientation  Lower    Pain Descriptors / Indicators  Aching    Pain Type  Chronic pain    Pain Onset  More than a month ago    Pain Frequency  Intermittent    Aggravating Factors   worse with prolonged standing    Pain Relieving Factors  rest/sitting    Effect of Pain on Daily Activities  unable to stand for long periods of time;     Multiple Pain Sites  No         [facility] PT Assessment - 04/14/18 0001      Strength   Right Hip Flexion  4/5    Right Hip ABduction  4/5    Right Hip ADduction  4/5    Left Hip ABduction  4+/5     Left Hip ADduction  4/5    Right Knee Flexion  4+/5    Right Knee Extension  4+/5    Left Knee Flexion  4+/5    Left Knee Extension  4+/5    Right Ankle Dorsiflexion  4-/5    Left Ankle Dorsiflexion  4-/5      Standardized Balance Assessment   Five times sit to stand comments   28 sec with1  HHA, >15 sec indicates high fall risk, more impaired than 03/02/18 which was 23 sec with 1 HHA    10 Meter Walk  0.6 m/s without AD, improved from 0.52 m/s with SPC on 03/02/18      Berg Balance Test   Sit to Stand  Able to stand  independently using hands    Standing Unsupported  Able to stand safely 2 minutes    Sitting with Back Unsupported but Feet Supported on Floor or Stool  Able to sit safely and securely 2 minutes    Stand to Sit  Controls descent by using hands    Transfers  Able to transfer safely, definite need of hands    Standing Unsupported with Eyes Closed  Able to stand 10 seconds with supervision    Standing Ubsupported with Feet Together  Able to place feet together independently and stand 1 minute safely    From Standing, Reach Forward with Outstretched Arm  Can reach forward >12 cm safely (5")    From Standing Position, Pick up Object from Floor  Able to pick up shoe, needs supervision    From Standing Position, Turn to Look Behind Over each Shoulder  Turn sideways only but maintains balance    Turn 360 Degrees  Able to turn 360 degrees safely but slowly    Standing Unsupported, Alternately Place Feet on Step/Stool  Able to stand independently and complete 8 steps >20 seconds    Standing Unsupported, One Foot in Front  Able to take small step independently and hold 30 seconds    Standing on One Leg  Tries to lift leg/unable to hold 3 seconds but remains standing independently    Total Score  40         TREATMENT:  Warm up on Nustep BUE/BLE level 2 x5 min (Unbilled); Strengthening: Instructed patient in 10 meter walk, 5 times sit<>Stand and other outcome measures to  address progress, see above;  Sidelying clamshell red tband x15 bilaterally; Required min VCs for positioning for optimum muscle activation;  Supine: SLR hip flexion x10 reps bilaterally; Patient reports increased difficulty on RLE as compared to LLE;   Hooklying: Lumbar trunk rotation x10 bilaterally; Single knee to chest stretch 20 sec hold x2 reps bilaterally; Patient required min-moderate verbal/tactile cues for correct exercise technique.    Balance: PT instructed patient in [place], see above;                   PT Education - 04/14/18 1157    Education Details  progress towards goals, strengthening/HEP reinforced;     Person(s) Educated  Patient    Methods  Explanation;Verbal cues    Comprehension  Verbalized understanding;Returned demonstration;Verbal cues required       PT Short Term Goals - 04/14/18 1155      PT SHORT TERM GOAL #1   Title  Patient will be adherent to HEP at least 3x a week to improve functional strength and balance for better safety at home.    Baseline  Performing exercises everyday    Time  4    Period  Weeks    Status  Achieved      PT SHORT TERM GOAL #2   Title  Patient will be able to transfer sit<>Stand from a regular height chair with 1 UE assist or less, independent to improve ability to get up out of chairs/couch.    Time  4    Period  Weeks    Status  Achieved        PT Long Term Goals - 04/14/18 1156      PT LONG TERM GOAL #1   Title  Patient will increase BLE gross strength to 4+/5 as to improve functional strength for independent gait, increased standing tolerance and increased ADL ability.    Time  4    Period  Weeks    Status  Partially Met    Target Date  05/12/18      PT LONG TERM GOAL #2   Title  Patient will increase 10 meter walk test to >1.11ms as to improve gait speed for better community ambulation and to reduce fall risk.    Time  4    Period  Weeks    Status  Partially Met     Target Date  05/12/18      PT LONG TERM GOAL #3   Title  Patient (> 610years old) will complete five times sit to stand test in < 15 seconds indicating an increased LE strength and improved balance.    Time  4    Period  Weeks    Status  Partially Met    Target Date  05/12/18      PT LONG TERM GOAL #4   Title  Patient will be mod I when negotiating a curb with LRAD to exhibit improved gait ability and safety in community.     Time  4    Period  Weeks    Status  Partially Met    Target Date  05/12/18            Plan - 04/14/18 1341    Clinical Impression Statement  Patient is progressing well. He exhibits improvement in LE strength as well as improved gait ability. Patient does report less numbness in feet with adherence to HEP and stretches. He has been consistently using SPC more as compared to prior to therapy; Patient  does test as a high fall risk. he would benefit from additional skilled PT Intervention to improve strength and gait ability while working on static and dynamic balance tasks.   Patient's condition has the potential to improve in response to therapy. Maximum improvement is yet to be obtained. The anticipated improvement is attainable and reasonable in a generally predictable time.  Patient reports  Some improvement but states that he is still not where he wants to be. He is still a little slow with ambulation and does get unsteady.   Rehab Potential  Fair    Clinical Impairments Affecting Rehab Potential  motivated; concerned about progressive stenosis with increased numbness in legs;     PT Frequency  2x / week    PT Duration  4 weeks    PT Treatment/Interventions  Aquatic Therapy;Cryotherapy;Electrical Stimulation;Moist Heat;Therapeutic exercise;Therapeutic activities;Functional mobility training;Stair training;Gait training;Balance training;Neuromuscular re-education;Patient/family education;Orthotic Fit/Training;Manual techniques;Energy conservation    PT Next  Visit Plan  Advance HEP, progress resistance and strength training    PT Home Exercise Plan  seated marching, hamstring curls, and hip abduction with green tband; trunk rotation and single knee to chest in AM and PM, Standing mini squats    Consulted and Agree with Plan of Care  Patient       Patient will benefit from skilled therapeutic intervention in order to improve the following deficits and impairments:  Decreased endurance, Decreased activity tolerance, Decreased strength, Pain, Decreased balance, Decreased mobility, Difficulty walking, Postural dysfunction, Impaired flexibility, Decreased safety awareness  Visit Diagnosis: Muscle weakness (generalized)  Other abnormalities of gait and mobility     Problem List Patient Active Problem List   Diagnosis Date Noted  . Acute ST elevation myocardial infarction (STEMI) of anterolateral wall ([place])   . Angio-edema   . Nonsustained ventricular tachycardia ([place])   . Acute respiratory failure ([place])   . Airway obstruction   . Macroglossia   . Non-STEMI (non-ST elevated myocardial infarction) ([place])   . Anaphylaxis 09/30/2016    , PT, DPT 04/14/2018, 1:43 PM  [facility] MAIN [facility] SERVICES [address] [place], [place], [address] Phone: [phone]   Fax:  [phone]  Name: Donald Riley MRN: [id number] Date of Birth: 01-Sep-1933

## 2018-04-20 ENCOUNTER — Encounter: Payer: Self-pay | Admitting: Physical Therapy

## 2018-04-20 ENCOUNTER — Ambulatory Visit: Payer: Medicare Other | Admitting: Physical Therapy

## 2018-04-20 DIAGNOSIS — R2689 Other abnormalities of gait and mobility: Secondary | ICD-10-CM

## 2018-04-20 DIAGNOSIS — M6281 Muscle weakness (generalized): Secondary | ICD-10-CM

## 2018-04-20 NOTE — Therapy (Signed)
[place] MAIN [facility] SERVICES [address] [place], [place], [address] Phone: [phone]   Fax:  [phone]  Physical Therapy Treatment  Patient Details  Name: Donald Riley MRN: [id number] Date of Birth: October 27, 1933 Referring Provider (PT): Hortencia Pilar MD   Encounter Date: 04/20/2018  PT End of Session - 04/20/18 1201    Visit Number  21    Number of Visits  33    Date for PT Re-Evaluation  05/12/18    Authorization Type  1/10 progress note    PT Start Time  1147    PT Stop Time  1230    PT Time Calculation (min)  43 min    Equipment Utilized During Treatment  Gait belt    Activity Tolerance  Patient tolerated treatment well    Behavior During Therapy  WFL for tasks assessed/performed       Past Medical History:  Diagnosis Date  . Allergy to alpha-gal   . Arthritis   . Asthma   . Atrial fibrillation ([place])   . Cancer ([place]) 2002  . GERD (gastroesophageal reflux disease)   . Glaucoma   . Gout   . Heart murmur   . Hypertension   . Neuropathy   . Osteoporosis   . Sleep apnea     Past Surgical History:  Procedure Laterality Date  . ARTHROPLASTY Right    ankle  . BUNIONECTOMY    . CARDIAC CATHETERIZATION N/A 07/02/2015   Procedure: Right/Left Heart Cath and Coronary Angiography;  Surgeon: Teodoro Spray, MD;  Location: [place] [facility];  Service: Cardiovascular;  Laterality: N/A;  . CORONARY/GRAFT ACUTE MI REVASCULARIZATION N/A 10/02/2016   Procedure: Coronary/Graft Acute MI Revascularization;  Surgeon: Wellington Hampshire, MD;  Location: [place] [facility];  Service: Cardiovascular;  Laterality: N/A;  . JOINT REPLACEMENT    . ostatectomy      There were no vitals filed for this visit.  Subjective Assessment - 04/20/18 1158    Subjective  Patient reports doing well; Reports minimal pain and less numbness in feet. He is still walking slow. He was unable to do his stretches this morning as he ran out of time;      Pertinent History  85 yo Male reports increased weakness in BLE over last 6 months. Patient has a history of chronic low back pain with radicular symptoms. He is s/p L1/L2, L5/S1 non-surgical fusion with degenerative disc disease. In addition CT of 2017 shows a moderate-severe central spinal stenosis and severe facet disease. He denies any surgery for lumbar spine; He reports spine center reports that he is not a surgical candidate as the spine is too severe.  He has been to a chiropractor in the past with no relief. Patient also has a diagnosis of polyneuropathy which could be contributing to his symptoms. He is ambulating with a SPC. He will be following up with spine surgeon and possibly get steroid injection. He has had steriod injections in the past with mixed results.     Limitations  Other (comment)    How long can you sit comfortably?  NA    How long can you stand comfortably?  20-30 min, he reports increased numbness in legs after a few minutes    How long can you walk comfortably?  He walks with cane/walker, takes longer time, able to walk >500 feet; has a harder time walking downhill    Diagnostic tests  MRI shows multiple lumbar segment stenosis/facet degeneration  Patient Stated Goals  Improve walking, increase strength in legs, feeling more steady;     Currently in Pain?  No/denies    Pain Onset  More than a month ago    Multiple Pain Sites  No         TREATMENT: Warm up on Nustep BUE/BLE level 2 x3 min (Unbilled);  Hooklying on mat table with moist heat on low back: Lumbar trunk rotation x10 bilaterally; Single knee to chest stretch 20 sec hold x2 reps bilaterally; Patient required min-moderate verbal/tactile cues for correct exercise technique.   Sidelying clamshell green tband x15 bilaterally; Required min VCs for positioning for optimum muscle activation;  Supine: SLR hip flexion x15 reps bilaterally; Patient reports increased difficulty on RLE as compared to LLE;      Patient instructed in advanced balance exercise  Standing in parallel bars:  Standing on airex foam: -Feet apart, BUE scapular retraction with red tband x15 reps for postural strengthening to improve erect posture for better stance control;  -Heel/toe raises x15 reps with rail assist for balance -modified tandem stance: head turns side/side, up/down x5 reps each foot in front; -modified tandem stance, BUE ball pass side/side x5 reps each foot in front;  Patient required min VCs for balance stability, including to increase trunk control for less loss of balance with smaller base of support  Standing on 1/2 foam: (Flat side up) -heel/toe rocks with feet apart heel/toe rocks x15 with rail assist for safety  Stepping over hurdle: Forward/backward with 2-1 rail assist x10 reps  Required mod VCs to increase hip flexion and increase step length for better foot clearance; Also required cues to increase erect posture and use mirror for visual feedback for foot clearance;  Big step with B rail assist, forward/backward weight shift with mod VCs to increase knee extension of front leg and increase hip extension of front leg as shift forward for better weight shift during mid stance x 5 reps each foot in front  Patient exhibits decreased hip extension on RLE during mid stance which leads to decreased step length of LLE; Would benefit from additional weight shift to improve dynamic balance control;                PT Education - 04/20/18 1201    Education Details  strengthening/stretches, HEP reinforced; Balance exercise;     Person(s) Educated  Patient    Methods  Explanation;Verbal cues    Comprehension  Verbalized understanding;Returned demonstration;Verbal cues required;Need further instruction       PT Short Term Goals - 04/14/18 1155      PT SHORT TERM GOAL #1   Title  Patient will be adherent to HEP at least 3x a week to improve functional strength and balance for better  safety at home.    Baseline  Performing exercises everyday    Time  4    Period  Weeks    Status  Achieved      PT SHORT TERM GOAL #2   Title  Patient will be able to transfer sit<>Stand from a regular height chair with 1 UE assist or less, independent to improve ability to get up out of chairs/couch.    Time  4    Period  Weeks    Status  Achieved        PT Long Term Goals - 04/14/18 1156      PT LONG TERM GOAL #1   Title  Patient will increase BLE gross strength to 4+/5  as to improve functional strength for independent gait, increased standing tolerance and increased ADL ability.    Time  4    Period  Weeks    Status  Partially Met    Target Date  05/12/18      PT LONG TERM GOAL #2   Title  Patient will increase 10 meter walk test to >1.89ms as to improve gait speed for better community ambulation and to reduce fall risk.    Time  4    Period  Weeks    Status  Partially Met    Target Date  05/12/18      PT LONG TERM GOAL #3   Title  Patient (> 666years old) will complete five times sit to stand test in < 15 seconds indicating an increased LE strength and improved balance.    Time  4    Period  Weeks    Status  Partially Met    Target Date  05/12/18      PT LONG TERM GOAL #4   Title  Patient will be mod I when negotiating a curb with LRAD to exhibit improved gait ability and safety in community.     Time  4    Period  Weeks    Status  Partially Met    Target Date  05/12/18            Plan - 04/20/18 1257    Clinical Impression Statement  Patient is progressing well. Instructed patient in LE stretches and strengthening exercise to improve flexibility at start of session. Following exercise, he is able to exhibit improved step length and gait ability; Patient tolerated balance exercise well. He did have difficulty with weight shift with decreased hip extension noted on RLE. Patient would benefit from additional skilled PT Intervention to improve strength, balance  and gait safety;     Rehab Potential  Fair    Clinical Impairments Affecting Rehab Potential  motivated; concerned about progressive stenosis with increased numbness in legs;     PT Frequency  2x / week    PT Duration  4 weeks    PT Treatment/Interventions  Aquatic Therapy;Cryotherapy;Electrical Stimulation;Moist Heat;Therapeutic exercise;Therapeutic activities;Functional mobility training;Stair training;Gait training;Balance training;Neuromuscular re-education;Patient/family education;Orthotic Fit/Training;Manual techniques;Energy conservation    PT Next Visit Plan  Advance HEP, progress resistance and strength training    PT Home Exercise Plan  seated marching, hamstring curls, and hip abduction with green tband; trunk rotation and single knee to chest in AM and PM, Standing mini squats    Consulted and Agree with Plan of Care  Patient       Patient will benefit from skilled therapeutic intervention in order to improve the following deficits and impairments:  Decreased endurance, Decreased activity tolerance, Decreased strength, Pain, Decreased balance, Decreased mobility, Difficulty walking, Postural dysfunction, Impaired flexibility, Decreased safety awareness  Visit Diagnosis: Muscle weakness (generalized)  Other abnormalities of gait and mobility     Problem List Patient Active Problem List   Diagnosis Date Noted  . Acute ST elevation myocardial infarction (STEMI) of anterolateral wall ([place]   . Angio-edema   . Nonsustained ventricular tachycardia ([place]   . Acute respiratory failure ([place]   . Airway obstruction   . Macroglossia   . Non-STEMI (non-ST elevated myocardial infarction) ([place]   . Anaphylaxis 09/30/2016    Sheala Dosh PT, DPT 04/20/2018, 12:58 PM  [place] [facility] Medicine[facility] [address] [place] [address]Phone: [phone]  Fax:  [phone]  Name: MICHAIAH HOLSOPPLE MRN: [id number] Date of Birth:  1934/03/25

## 2018-04-26 ENCOUNTER — Encounter: Payer: Self-pay | Admitting: Physical Therapy

## 2018-04-26 ENCOUNTER — Ambulatory Visit: Payer: Medicare Other | Admitting: Physical Therapy

## 2018-04-26 DIAGNOSIS — R2689 Other abnormalities of gait and mobility: Secondary | ICD-10-CM

## 2018-04-26 DIAGNOSIS — M6281 Muscle weakness (generalized): Secondary | ICD-10-CM

## 2018-04-26 NOTE — Therapy (Signed)
[place] MAIN [facility] SERVICES [address] [place], [place], [address] Phone: [phone]   Fax:  [phone]  Physical Therapy Treatment  Patient Details  Name: Donald Riley MRN: [id number] Date of Birth: [birth year] Referring Provider (PT): Hortencia Pilar MD   Encounter Date: 04/26/2018  PT End of Session - 04/26/18 1305    Visit Number  22    Number of Visits  33    Date for PT Re-Evaluation  05/12/18    Authorization Type  2/10 progress note    PT Start Time  1302    PT Stop Time  1345    PT Time Calculation (min)  43 min    Equipment Utilized During Treatment  Gait belt    Activity Tolerance  Patient tolerated treatment well    Behavior During Therapy  WFL for tasks assessed/performed       Past Medical History:  Diagnosis Date  . Allergy to alpha-gal   . Arthritis   . Asthma   . Atrial fibrillation ([place])   . Cancer ([place]) 2002  . GERD (gastroesophageal reflux disease)   . Glaucoma   . Gout   . Heart murmur   . Hypertension   . Neuropathy   . Osteoporosis   . Sleep apnea     Past Surgical History:  Procedure Laterality Date  . ARTHROPLASTY Right    ankle  . BUNIONECTOMY    . CARDIAC CATHETERIZATION N/A 07/02/2015   Procedure: Right/Left Heart Cath and Coronary Angiography;  Surgeon: Teodoro Spray, MD;  Location: [place] [facility];  Service: Cardiovascular;  Laterality: N/A;  . CORONARY/GRAFT ACUTE MI REVASCULARIZATION N/A 10/02/2016   Procedure: Coronary/Graft Acute MI Revascularization;  Surgeon: Wellington Hampshire, MD;  Location: [place] [facility];  Service: Cardiovascular;  Laterality: N/A;  . JOINT REPLACEMENT    . ostatectomy      There were no vitals filed for this visit.  Subjective Assessment - 04/26/18 1304    Subjective  Patient reports less pain today; He is having some stiffness in low back and weakness in LE. He denies any new falls or other new changes;     Pertinent History  85 yo Male reports  increased weakness in BLE over last 6 months. Patient has a history of chronic low back pain with radicular symptoms. He is s/p L1/L2, L5/S1 non-surgical fusion with degenerative disc disease. In addition CT of 2017 shows a moderate-severe central spinal stenosis and severe facet disease. He denies any surgery for lumbar spine; He reports spine center reports that he is not a surgical candidate as the spine is too severe.  He has been to a chiropractor in the past with no relief. Patient also has a diagnosis of polyneuropathy which could be contributing to his symptoms. He is ambulating with a SPC. He will be following up with spine surgeon and possibly get steroid injection. He has had steriod injections in the past with mixed results.     Limitations  Other (comment)    How long can you sit comfortably?  NA    How long can you stand comfortably?  20-30 min, he reports increased numbness in legs after a few minutes    How long can you walk comfortably?  He walks with cane/walker, takes longer time, able to walk >500 feet; has a harder time walking downhill    Diagnostic tests  MRI shows multiple lumbar segment stenosis/facet degeneration    Patient Stated Goals  Improve walking, increase strength in legs, feeling more steady;     Currently in Pain?  No/denies    Pain Onset  More than a month ago    Multiple Pain Sites  No           TREATMENT: Leg press: BLE plate 115#, 2V95 with cues for correct positioning for optimum muscle activation;   Hooklying on mat table:  Single knee to chest stretch 20 sec hold x2 reps bilaterally; Bridges with arms by side x15 reps with cues to avoid painful ROM;  Patient required min-moderate verbal/tactile cues for correct exercise technique.   Sidelying clamshell green tband x15 bilaterally; Required min VCs for positioning for optimum muscle activation;  Seated hip flexion march x10 bilaterally x2 sets with cues to keep core abdominal stabilization and  to increase ROM to tolerance;               Patient instructed in advanced balance exercise  Standing in parallel bars:  Standing on airex foam beam: -Tandem stance with 2-0 rail assist, 10 sec hold x2 reps each foot in front  -Feet apart, BUE scapular retraction with red tband x10 reps for postural strengthening to improve erect posture for better stance control;  -Side stepping with 2-1 rail assist x1 lap with cues for erect posture; Patient required min VCs for balance stability, including to increase trunk control for less loss of balance with smaller base of support  Stepping over hurdle: Forward/backward with 2-1 rail assist x10 reps  Required mod VCs to increase hip flexion and increase step length for better foot clearance; Also required cues to increase erect posture and use mirror for visual feedback for foot clearance;  Big step with B rail assist, forward/backward weight shift with mod VCs to increase knee extension of front leg and increase hip extension of front leg as shift forward for better weight shift during mid stance x 5 reps each foot in front  Patient exhibits decreased hip extension on RLE during mid stance which leads to decreased step length of LLE; Would benefit from additional weight shift to improve dynamic balance control;                        PT Education - 04/26/18 1305    Education Details  strengthening/stretches; HEP reinforced, gait safety    Person(s) Educated  Patient    Methods  Explanation;Verbal cues    Comprehension  Verbalized understanding;Returned demonstration;Verbal cues required;Need further instruction       PT Short Term Goals - 04/14/18 1155      PT SHORT TERM GOAL #1   Title  Patient will be adherent to HEP at least 3x a week to improve functional strength and balance for better safety at home.    Baseline  Performing exercises everyday    Time  4    Period  Weeks    Status  Achieved      PT SHORT  TERM GOAL #2   Title  Patient will be able to transfer sit<>Stand from a regular height chair with 1 UE assist or less, independent to improve ability to get up out of chairs/couch.    Time  4    Period  Weeks    Status  Achieved        PT Long Term Goals - 04/14/18 1156      PT LONG TERM GOAL #1   Title  Patient will increase BLE gross strength  to 4+/5 as to improve functional strength for independent gait, increased standing tolerance and increased ADL ability.    Time  4    Period  Weeks    Status  Partially Met    Target Date  05/12/18      PT LONG TERM GOAL #2   Title  Patient will increase 10 meter walk test to >1.21ms as to improve gait speed for better community ambulation and to reduce fall risk.    Time  4    Period  Weeks    Status  Partially Met    Target Date  05/12/18      PT LONG TERM GOAL #3   Title  Patient (> 862years old) will complete five times sit to stand test in < 15 seconds indicating an increased LE strength and improved balance.    Time  4    Period  Weeks    Status  Partially Met    Target Date  05/12/18      PT LONG TERM GOAL #4   Title  Patient will be mod I when negotiating a curb with LRAD to exhibit improved gait ability and safety in community.     Time  4    Period  Weeks    Status  Partially Met    Target Date  05/12/18            Plan - 04/26/18 1347    Clinical Impression Statement  Patient is progressing slowly. He does have increased flexed posture which is contributing to shorter step length and impaired gait mechanics. He is tolerated the balance and progressive standing exercises well. He would benefit from additional skilled PT Intervention to improve strength, balance and gait safety;     Rehab Potential  Fair    Clinical Impairments Affecting Rehab Potential  motivated; concerned about progressive stenosis with increased numbness in legs;     PT Frequency  2x / week    PT Duration  4 weeks    PT Treatment/Interventions   Aquatic Therapy;Cryotherapy;Electrical Stimulation;Moist Heat;Therapeutic exercise;Therapeutic activities;Functional mobility training;Stair training;Gait training;Balance training;Neuromuscular re-education;Patient/family education;Orthotic Fit/Training;Manual techniques;Energy conservation    PT Next Visit Plan  Advance HEP, progress resistance and strength training    PT Home Exercise Plan  seated marching, hamstring curls, and hip abduction with green tband; trunk rotation and single knee to chest in AM and PM, Standing mini squats    Consulted and Agree with Plan of Care  Patient       Patient will benefit from skilled therapeutic intervention in order to improve the following deficits and impairments:  Decreased endurance, Decreased activity tolerance, Decreased strength, Pain, Decreased balance, Decreased mobility, Difficulty walking, Postural dysfunction, Impaired flexibility, Decreased safety awareness  Visit Diagnosis: Muscle weakness (generalized)  Other abnormalities of gait and mobility     Problem List Patient Active Problem List   Diagnosis Date Noted  . Acute ST elevation myocardial infarction (STEMI) of anterolateral wall ([place]   . Angio-edema   . Nonsustained ventricular tachycardia ([place]   . Acute respiratory failure ([place]   . Airway obstruction   . Macroglossia   . Non-STEMI (non-ST elevated myocardial infarction) ([place]   . Anaphylaxis 09/30/2016    Yaman Grauberger,MargaretPT, DPT 04/26/2018, 1:48 PM  [place] [facility]MAIN RFayette [facility] CenterSERVICES [address]RRo[address] [place] [address]Phone: [phone]  Fax:  [phone] Name: SORRIE SCHUBERTMRN: [id number] of Birth: 4November 17, 1935

## 2018-04-28 ENCOUNTER — Encounter: Payer: Self-pay | Admitting: Physical Therapy

## 2018-04-28 ENCOUNTER — Ambulatory Visit: Payer: Medicare Other | Admitting: Physical Therapy

## 2018-04-28 DIAGNOSIS — R2689 Other abnormalities of gait and mobility: Secondary | ICD-10-CM

## 2018-04-28 DIAGNOSIS — M6281 Muscle weakness (generalized): Secondary | ICD-10-CM | POA: Diagnosis not present

## 2018-04-28 NOTE — Therapy (Signed)
[place] MAIN [facility] SERVICES [address] [place], [place], [address] Phone: [phone]   Fax:  [phone]  Physical Therapy Treatment  Patient Details  Name: Donald Riley MRN: [id number] Date of Birth: Feb 23, 1934 Referring Provider (PT): Hortencia Pilar MD   Encounter Date: 04/28/2018  PT End of Session - 04/28/18 1453    Visit Number  23    Number of Visits  33    Date for PT Re-Evaluation  05/12/18    Authorization Type  3/10 progress note    PT Start Time  1445    PT Stop Time  1515    PT Time Calculation (min)  30 min    Equipment Utilized During Treatment  Gait belt    Activity Tolerance  Patient tolerated treatment well    Behavior During Therapy  WFL for tasks assessed/performed       Past Medical History:  Diagnosis Date  . Allergy to alpha-gal   . Arthritis   . Asthma   . Atrial fibrillation ([place])   . Cancer ([place]) 2002  . GERD (gastroesophageal reflux disease)   . Glaucoma   . Gout   . Heart murmur   . Hypertension   . Neuropathy   . Osteoporosis   . Sleep apnea     Past Surgical History:  Procedure Laterality Date  . ARTHROPLASTY Right    ankle  . BUNIONECTOMY    . CARDIAC CATHETERIZATION N/A 07/02/2015   Procedure: Right/Left Heart Cath and Coronary Angiography;  Surgeon: Teodoro Spray, MD;  Location: [place] [facility];  Service: Cardiovascular;  Laterality: N/A;  . CORONARY/GRAFT ACUTE MI REVASCULARIZATION N/A 10/02/2016   Procedure: Coronary/Graft Acute MI Revascularization;  Surgeon: Wellington Hampshire, MD;  Location: [place] [facility];  Service: Cardiovascular;  Laterality: N/A;  . JOINT REPLACEMENT    . ostatectomy      There were no vitals filed for this visit.  Subjective Assessment - 04/28/18 1452    Subjective  Patient reports less pain today; He presents to therapy with a  bandage on his forhead from skin cancer removal; Reports no new falls;     Pertinent History  85 yo Male reports  increased weakness in BLE over last 6 months. Patient has a history of chronic low back pain with radicular symptoms. He is s/p L1/L2, L5/S1 non-surgical fusion with degenerative disc disease. In addition CT of 2017 shows a moderate-severe central spinal stenosis and severe facet disease. He denies any surgery for lumbar spine; He reports spine center reports that he is not a surgical candidate as the spine is too severe.  He has been to a chiropractor in the past with no relief. Patient also has a diagnosis of polyneuropathy which could be contributing to his symptoms. He is ambulating with a SPC. He will be following up with spine surgeon and possibly get steroid injection. He has had steriod injections in the past with mixed results.     Limitations  Other (comment)    How long can you sit comfortably?  NA    How long can you stand comfortably?  20-30 min, he reports increased numbness in legs after a few minutes    How long can you walk comfortably?  He walks with cane/walker, takes longer time, able to walk >500 feet; has a harder time walking downhill    Diagnostic tests  MRI shows multiple lumbar segment stenosis/facet degeneration    Patient Stated Goals  Improve  walking, increase strength in legs, feeling more steady;     Currently in Pain?  No/denies    Pain Onset  More than a month ago    Multiple Pain Sites  No           TREATMENT:  Hooklying on mat table:  Lumbar trunk rotation x10 reps bilaterally; Single knee to chest stretch 20 sec hold x2 reps bilaterally; Hamstring neural stretch with hip/knee flexion/extension 3 sec hold x5 reps each LE; Leg off table, hip flexor stretch 20 sec hold x2 reps; bilaterally;  Bridges with arms by side x15 reps with cues to avoid painful ROM;  -Green tband around BLE: - hip flexion march 2x10 bilaterally; -BLE hip abduction/ER x15 Patient required min-moderate verbal/tactile cues for correct exercise technique including to increase AROM for  better strengthening; .  Leg press: BLE plate #6 O13 with cues for correct positioning for optimum muscle activation;   Hamstring curl, BLE plate #5, Y86 with min VCs for correct positioning for better LE strengthening;  Patient tolerated session well; He was able to walk better at end of session with improved step length and better gait stability;  Denies any pain at end of session;                   PT Education - 04/28/18 1452    Education Details  strengthening/HEP reinforced;     Person(s) Educated  Patient    Methods  Explanation;Verbal cues    Comprehension  Verbalized understanding;Returned demonstration;Verbal cues required;Need further instruction       PT Short Term Goals - 04/14/18 1155      PT SHORT TERM GOAL #1   Title  Patient will be adherent to HEP at least 3x a week to improve functional strength and balance for better safety at home.    Baseline  Performing exercises everyday    Time  4    Period  Weeks    Status  Achieved      PT SHORT TERM GOAL #2   Title  Patient will be able to transfer sit<>Stand from a regular height chair with 1 UE assist or less, independent to improve ability to get up out of chairs/couch.    Time  4    Period  Weeks    Status  Achieved        PT Long Term Goals - 04/14/18 1156      PT LONG TERM GOAL #1   Title  Patient will increase BLE gross strength to 4+/5 as to improve functional strength for independent gait, increased standing tolerance and increased ADL ability.    Time  4    Period  Weeks    Status  Partially Met    Target Date  05/12/18      PT LONG TERM GOAL #2   Title  Patient will increase 10 meter walk test to >1.40ms as to improve gait speed for better community ambulation and to reduce fall risk.    Time  4    Period  Weeks    Status  Partially Met    Target Date  05/12/18      PT LONG TERM GOAL #3   Title  Patient (> 658years old) will complete five times sit to stand test in < 15  seconds indicating an increased LE strength and improved balance.    Time  4    Period  Weeks    Status  Partially Met  Target Date  05/12/18      PT LONG TERM GOAL #4   Title  Patient will be mod I when negotiating a curb with LRAD to exhibit improved gait ability and safety in community.     Time  4    Period  Weeks    Status  Partially Met    Target Date  05/12/18            Plan - 04/28/18 1453    Clinical Impression Statement  Patient late to session; He tolerated exercise well reporting less stiffness at end of session; Instructed patient in advanced LE strengthening; He does require min VCs for correct exercise technique; He would benefit from additional skilled PT Intervention to improve strength, balance and gait safety;     Rehab Potential  Fair    Clinical Impairments Affecting Rehab Potential  motivated; concerned about progressive stenosis with increased numbness in legs;     PT Frequency  2x / week    PT Duration  4 weeks    PT Treatment/Interventions  Aquatic Therapy;Cryotherapy;Electrical Stimulation;Moist Heat;Therapeutic exercise;Therapeutic activities;Functional mobility training;Stair training;Gait training;Balance training;Neuromuscular re-education;Patient/family education;Orthotic Fit/Training;Manual techniques;Energy conservation    PT Next Visit Plan  Advance HEP, progress resistance and strength training    PT Home Exercise Plan  seated marching, hamstring curls, and hip abduction with green tband; trunk rotation and single knee to chest in AM and PM, Standing mini squats    Consulted and Agree with Plan of Care  Patient       Patient will benefit from skilled therapeutic intervention in order to improve the following deficits and impairments:  Decreased endurance, Decreased activity tolerance, Decreased strength, Pain, Decreased balance, Decreased mobility, Difficulty walking, Postural dysfunction, Impaired flexibility, Decreased safety awareness  Visit  Diagnosis: Muscle weakness (generalized)  Other abnormalities of gait and mobility     Problem List Patient Active Problem List   Diagnosis Date Noted  . Acute ST elevation myocardial infarction (STEMI) of anterolateral wall ([place])   . Angio-edema   . Nonsustained ventricular tachycardia ([place])   . Acute respiratory failure ([place])   . Airway obstruction   . Macroglossia   . Non-STEMI (non-ST elevated myocardial infarction) ([place])   . Anaphylaxis 09/30/2016    Katora Fini PT, DPT 04/28/2018, 2:57 PM  [place] MAIN [facility] SERVICES [address] [place], [place], [address] Phone: [phone]   Fax:  [phone]  Name: Donald Riley MRN: [id number] Date of Birth: 11-24-1933

## 2018-05-03 ENCOUNTER — Encounter: Payer: Self-pay | Admitting: Physical Therapy

## 2018-05-03 ENCOUNTER — Ambulatory Visit: Payer: Medicare Other | Attending: Family Medicine | Admitting: Physical Therapy

## 2018-05-03 DIAGNOSIS — M6281 Muscle weakness (generalized): Secondary | ICD-10-CM | POA: Insufficient documentation

## 2018-05-03 DIAGNOSIS — R2689 Other abnormalities of gait and mobility: Secondary | ICD-10-CM | POA: Diagnosis present

## 2018-05-03 NOTE — Therapy (Signed)
[place] MAIN [facility] SERVICES [address] [place], [place], [address] Phone: [phone]   Fax:  [phone]  Physical Therapy Treatment  Patient Details  Name: Donald Riley MRN: [id number] Date of Birth: 1933-04-29 Referring Provider (PT): Hortencia Pilar MD   Encounter Date: 05/03/2018  PT End of Session - 05/03/18 1401    Visit Number  24    Number of Visits  33    Date for PT Re-Evaluation  05/12/18    Authorization Type  4/10  progress note    PT Start Time  1347    PT Stop Time  1430    PT Time Calculation (min)  43 min    Equipment Utilized During Treatment  Gait belt    Activity Tolerance  Patient tolerated treatment well    Behavior During Therapy  WFL for tasks assessed/performed       Past Medical History:  Diagnosis Date  . Allergy to alpha-gal   . Arthritis   . Asthma   . Atrial fibrillation ([place])   . Cancer ([place]) 2002  . GERD (gastroesophageal reflux disease)   . Glaucoma   . Gout   . Heart murmur   . Hypertension   . Neuropathy   . Osteoporosis   . Sleep apnea     Past Surgical History:  Procedure Laterality Date  . ARTHROPLASTY Right    ankle  . BUNIONECTOMY    . CARDIAC CATHETERIZATION N/A 07/02/2015   Procedure: Right/Left Heart Cath and Coronary Angiography;  Surgeon: Teodoro Spray, MD;  Location: [place] [facility];  Service: Cardiovascular;  Laterality: N/A;  . CORONARY/GRAFT ACUTE MI REVASCULARIZATION N/A 10/02/2016   Procedure: Coronary/Graft Acute MI Revascularization;  Surgeon: Wellington Hampshire, MD;  Location: [place] [facility];  Service: Cardiovascular;  Laterality: N/A;  . JOINT REPLACEMENT    . ostatectomy      There were no vitals filed for this visit.  Subjective Assessment - 05/03/18 1400    Subjective  My back feels okay today. My legs feel weak though. Patient reports adherence with HEP; denies any soreness after last session;     Pertinent History  85 yo Male reports increased  weakness in BLE over last 6 months. Patient has a history of chronic low back pain with radicular symptoms. He is s/p L1/L2, L5/S1 non-surgical fusion with degenerative disc disease. In addition CT of 2017 shows a moderate-severe central spinal stenosis and severe facet disease. He denies any surgery for lumbar spine; He reports spine center reports that he is not a surgical candidate as the spine is too severe.  He has been to a chiropractor in the past with no relief. Patient also has a diagnosis of polyneuropathy which could be contributing to his symptoms. He is ambulating with a SPC. He will be following up with spine surgeon and possibly get steroid injection. He has had steriod injections in the past with mixed results.     Limitations  Other (comment)    How long can you sit comfortably?  NA    How long can you stand comfortably?  20-30 min, he reports increased numbness in legs after a few minutes    How long can you walk comfortably?  He walks with cane/walker, takes longer time, able to walk >500 feet; has a harder time walking downhill    Diagnostic tests  MRI shows multiple lumbar segment stenosis/facet degeneration    Patient Stated Goals  Improve walking, increase  strength in legs, feeling more steady;     Currently in Pain?  No/denies    Pain Onset  More than a month ago    Multiple Pain Sites  No          TREATMENT:  Hooklying on mat table: Lumbar trunk rotation x10 reps bilaterally; Single knee to chest stretch 20 sec hold x2 reps bilaterally; Hamstring neural stretch with hip/knee flexion/extension 3 sec hold x5 reps each LE; -SLR hip flexion x12, 2# ankle weight with min VCs for positioning including to have contralateral LE bent for less back discomfort;  Bridges with arms by side x15 reps with cues to avoid painful ROM; Patient required min-moderate verbal/tactile cues for correct exercise technique including to increase AROM for better strengthening;  .  Sidelying: Hip abduction 2# x10 bilaterally, required min VCs for correct positioning;   Leg press: BLE plate #6, 2 E26 with cues for correct positioning for optimum muscle activation;  Hamstring curl, BLE plate #6, 8T41 with min VCs for correct positioning for better LE strengthening;  BUE low row with vertical handles plate #2, 9Q22 with min VCs for positioning and to increase scapular retraction for better upper back strengthening;  Patient tolerated session well; He was able to walk better at end of session with improved step length and better gait stability;  Denies any pain at end of session;                       PT Education - 05/03/18 1400    Education Details  strengthening/HEP reinforced;     Person(s) Educated  Patient    Methods  Explanation;Verbal cues    Comprehension  Verbalized understanding;Returned demonstration;Verbal cues required;Need further instruction       PT Short Term Goals - 04/14/18 1155      PT SHORT TERM GOAL #1   Title  Patient will be adherent to HEP at least 3x a week to improve functional strength and balance for better safety at home.    Baseline  Performing exercises everyday    Time  4    Period  Weeks    Status  Achieved      PT SHORT TERM GOAL #2   Title  Patient will be able to transfer sit<>Stand from a regular height chair with 1 UE assist or less, independent to improve ability to get up out of chairs/couch.    Time  4    Period  Weeks    Status  Achieved        PT Long Term Goals - 04/14/18 1156      PT LONG TERM GOAL #1   Title  Patient will increase BLE gross strength to 4+/5 as to improve functional strength for independent gait, increased standing tolerance and increased ADL ability.    Time  4    Period  Weeks    Status  Partially Met    Target Date  05/12/18      PT LONG TERM GOAL #2   Title  Patient will increase 10 meter walk test to >1.75ms as to improve gait speed for better community  ambulation and to reduce fall risk.    Time  4    Period  Weeks    Status  Partially Met    Target Date  05/12/18      PT LONG TERM GOAL #3   Title  Patient (> 89years old) will complete five times sit to  stand test in < 15 seconds indicating an increased LE strength and improved balance.    Time  4    Period  Weeks    Status  Partially Met    Target Date  05/12/18      PT LONG TERM GOAL #4   Title  Patient will be mod I when negotiating a curb with LRAD to exhibit improved gait ability and safety in community.     Time  4    Period  Weeks    Status  Partially Met    Target Date  05/12/18            Plan - 05/04/18 1128    Clinical Impression Statement  patient tolerated session well. He reports less stiffness with hooklying LE exercise. patient tolerated advanced resisted exercises well with minimal fatigue. He reports some improvement in gait with better flexibility. Patient would benefit from additional skilled PT Intervention to improve strength, balance and gait safety;     Rehab Potential  Fair    Clinical Impairments Affecting Rehab Potential  motivated; concerned about progressive stenosis with increased numbness in legs;     PT Frequency  2x / week    PT Duration  4 weeks    PT Treatment/Interventions  Aquatic Therapy;Cryotherapy;Electrical Stimulation;Moist Heat;Therapeutic exercise;Therapeutic activities;Functional mobility training;Stair training;Gait training;Balance training;Neuromuscular re-education;Patient/family education;Orthotic Fit/Training;Manual techniques;Energy conservation    PT Next Visit Plan  Advance HEP, progress resistance and strength training    PT Home Exercise Plan  seated marching, hamstring curls, and hip abduction with green tband; trunk rotation and single knee to chest in AM and PM, Standing mini squats    Consulted and Agree with Plan of Care  Patient       Patient will benefit from skilled therapeutic intervention in order to improve  the following deficits and impairments:  Decreased endurance, Decreased activity tolerance, Decreased strength, Pain, Decreased balance, Decreased mobility, Difficulty walking, Postural dysfunction, Impaired flexibility, Decreased safety awareness  Visit Diagnosis: Muscle weakness (generalized)  Other abnormalities of gait and mobility     Problem List Patient Active Problem List   Diagnosis Date Noted  . Acute ST elevation myocardial infarction (STEMI) of anterolateral wall ([place])   . Angio-edema   . Nonsustained ventricular tachycardia ([place])   . Acute respiratory failure ([place])   . Airway obstruction   . Macroglossia   . Non-STEMI (non-ST elevated myocardial infarction) ([place])   . Anaphylaxis 09/30/2016    , PT, DPT 05/04/2018, 11:29 AM  [place] MAIN [facility] SERVICES [address] [place], [place], [address] Phone: [phone]   Fax:  [phone]  Name: Donald Riley MRN: [id number] Date of Birth: [birth year]

## 2018-05-05 ENCOUNTER — Ambulatory Visit: Payer: Medicare Other | Admitting: Physical Therapy

## 2018-05-11 ENCOUNTER — Ambulatory Visit: Payer: Medicare Other | Admitting: Physical Therapy

## 2018-05-12 ENCOUNTER — Encounter: Payer: Self-pay | Admitting: Physical Therapy

## 2018-05-12 ENCOUNTER — Ambulatory Visit: Payer: Medicare Other | Admitting: Physical Therapy

## 2018-05-12 DIAGNOSIS — R2689 Other abnormalities of gait and mobility: Secondary | ICD-10-CM

## 2018-05-12 DIAGNOSIS — M6281 Muscle weakness (generalized): Secondary | ICD-10-CM

## 2018-05-12 NOTE — Therapy (Signed)
[place] MAIN [facility] SERVICES [address] [place], [place], [address] Phone: [phone]   Fax:  [phone]  Physical Therapy Treatment  Patient Details  Name: Donald Riley MRN: [id number] Date of Birth: 1933-05-26 Referring Provider (PT): Hortencia Pilar MD   Encounter Date: 05/12/2018  PT End of Session - 05/12/18 1358    Visit Number  25    Number of Visits  41    Date for PT Re-Evaluation  06/09/18    Authorization Type  4/10  progress note    PT Start Time  1348    PT Stop Time  1430    PT Time Calculation (min)  42 min    Equipment Utilized During Treatment  Gait belt    Activity Tolerance  Patient tolerated treatment well    Behavior During Therapy  WFL for tasks assessed/performed       Past Medical History:  Diagnosis Date  . Allergy to alpha-gal   . Arthritis   . Asthma   . Atrial fibrillation ([place])   . Cancer ([place]) 2002  . GERD (gastroesophageal reflux disease)   . Glaucoma   . Gout   . Heart murmur   . Hypertension   . Neuropathy   . Osteoporosis   . Sleep apnea     Past Surgical History:  Procedure Laterality Date  . ARTHROPLASTY Right    ankle  . BUNIONECTOMY    . CARDIAC CATHETERIZATION N/A 07/02/2015   Procedure: Right/Left Heart Cath and Coronary Angiography;  Surgeon: Teodoro Spray, MD;  Location: [place] [facility];  Service: Cardiovascular;  Laterality: N/A;  . CORONARY/GRAFT ACUTE MI REVASCULARIZATION N/A 10/02/2016   Procedure: Coronary/Graft Acute MI Revascularization;  Surgeon: Wellington Hampshire, MD;  Location: [place] [facility];  Service: Cardiovascular;  Laterality: N/A;  . JOINT REPLACEMENT    . ostatectomy      There were no vitals filed for this visit.  Subjective Assessment - 05/12/18 1355    Subjective  Patient reports increased stiffness and a little soreness in low back; He reports adherence with HEP and stretches; He reports still using RW some but has been using SPC more.     Pertinent History  85 yo Male reports increased weakness in BLE over last 6 months. Patient has a history of chronic low back pain with radicular symptoms. He is s/p L1/L2, L5/S1 non-surgical fusion with degenerative disc disease. In addition CT of 2017 shows a moderate-severe central spinal stenosis and severe facet disease. He denies any surgery for lumbar spine; He reports spine center reports that he is not a surgical candidate as the spine is too severe.  He has been to a chiropractor in the past with no relief. Patient also has a diagnosis of polyneuropathy which could be contributing to his symptoms. He is ambulating with a SPC. He will be following up with spine surgeon and possibly get steroid injection. He has had steriod injections in the past with mixed results.     Limitations  Other (comment)    How long can you sit comfortably?  NA    How long can you stand comfortably?  20-30 min, he reports increased numbness in legs after a few minutes    How long can you walk comfortably?  He walks with cane/walker, takes longer time, able to walk >500 feet; has a harder time walking downhill    Diagnostic tests  MRI shows multiple lumbar segment stenosis/facet degeneration  Patient Stated Goals  Improve walking, increase strength in legs, feeling more steady;     Currently in Pain?  Yes    Pain Score  3     Pain Location  Back    Pain Orientation  Lower    Pain Descriptors / Indicators  Aching;Sore    Pain Type  Chronic pain    Pain Onset  More than a month ago    Pain Frequency  Intermittent    Aggravating Factors   worse with prolonged standing    Pain Relieving Factors  rest/sitting    Effect of Pain on Daily Activities  unable to stand for long periods of time;     Multiple Pain Sites  No         [facility] PT Assessment - 05/12/18 0001      Strength   Right Hip ABduction  4+/5    Right Hip ADduction  4+/5    Left Hip ABduction  5/5    Left Hip ADduction  4+/5      Standardized  Balance Assessment   Five times sit to stand comments   28 sec with 1 HHA (>15 sec indicate high fall risk, no change from 04/14/18)    10 Meter Walk  0.625 m/s without SPC, 0.67 m/s with SPC, limited home ambulator, improved from 0.6 m/s on 04/14/18      Berg Balance Test   Sit to Stand  Able to stand  independently using hands    Standing Unsupported  Able to stand safely 2 minutes    Sitting with Back Unsupported but Feet Supported on Floor or Stool  Able to sit safely and securely 2 minutes    Stand to Sit  Controls descent by using hands    Transfers  Able to transfer safely, definite need of hands    Standing Unsupported with Eyes Closed  Able to stand 10 seconds safely    Standing Ubsupported with Feet Together  Able to place feet together independently and stand 1 minute safely    From Standing, Reach Forward with Outstretched Arm  Can reach forward >12 cm safely (5")    From Standing Position, Pick up Object from Floor  Able to pick up shoe safely and easily    From Standing Position, Turn to Look Behind Over each Shoulder  Looks behind from both sides and weight shifts well    Turn 360 Degrees  Able to turn 360 degrees safely but slowly    Standing Unsupported, Alternately Place Feet on Step/Stool  Able to stand independently and safely and complete 8 steps in 20 seconds    Standing Unsupported, One Foot in Front  Able to plae foot ahead of the other independently and hold 30 seconds    Standing on One Leg  Tries to lift leg/unable to hold 3 seconds but remains standing independently    Total Score  46    Berg comment:  50% risk for falls, improved from 40/56 on 04/14/18       TREATMENT:  Hooklying on mat table concurrent with moist heat to low back: Lumbar trunk rotation x10 reps bilaterally; Single knee to chest stretch 20 sec hold x3 reps bilaterally; Hamstring neural stretch with SLR ankle DF/PF neural flossing2 x10 reps bilaterally; -SLR hip flexion x10 bilaterally, 2#  ankle weight with min VCs for positioning including to have contralateral LE bent for less back discomfort;  Bridges with arms by side 2x15 reps with cues to avoid painful  ROM; Patient required min-moderate verbal/tactile cues for correct exercise techniqueincluding to increase AROM for better strengthening;.    Instructed patient in [place] Assessment, 10 meter walk, 5 times sit<>Stand to address goals, see above;  Pt tolerated session well. He exhibits improved gait mechanics with better step length and improved gait speed following exercise. He also reports less numbness in right foot following hooklying exercise;                  PT Education - 05/12/18 1358    Education Details  strengthening; HEP reinforced; progress towards goals;     Person(s) Educated  Patient    Methods  Explanation;Verbal cues    Comprehension  Verbalized understanding;Returned demonstration;Verbal cues required;Need further instruction       PT Short Term Goals - 05/12/18 1359      PT SHORT TERM GOAL #1   Title  Patient will be adherent to HEP at least 3x a week to improve functional strength and balance for better safety at home.    Baseline  Performing exercises everyday    Time  4    Period  Weeks    Status  Achieved      PT SHORT TERM GOAL #2   Title  Patient will be able to transfer sit<>Stand from a regular height chair with 1 UE assist or less, independent to improve ability to get up out of chairs/couch.    Time  4    Period  Weeks    Status  Achieved        PT Long Term Goals - 05/12/18 1359      PT LONG TERM GOAL #1   Title  Patient will increase BLE gross strength to 4+/5 as to improve functional strength for independent gait, increased standing tolerance and increased ADL ability.    Time  4    Period  Weeks    Status  Achieved      PT LONG TERM GOAL #2   Title  Patient will increase 10 meter walk test to >1.33ms as to improve gait speed for better community  ambulation and to reduce fall risk.    Time  4    Period  Weeks    Status  Partially Met    Target Date  06/09/18      PT LONG TERM GOAL #3   Title  Patient (> 663years old) will complete five times sit to stand test in < 15 seconds indicating an increased LE strength and improved balance.    Time  4    Period  Weeks    Status  Partially Met    Target Date  06/09/18      PT LONG TERM GOAL #4   Title  Patient will be mod I when negotiating a curb with LRAD to exhibit improved gait ability and safety in community.     Time  4    Period  Weeks    Status  Partially Met    Target Date  06/09/18            Plan - 05/12/18 1435    Clinical Impression Statement  Patient is making some progress. He does exhibit a significant improvement in balance based on Berg Balance Assessment. He also exhibits improvement in BLE strength particularly in hip abduction. Following exercise, he was able to walk faster with and without SPC as compared to a month ago. Patient does have a chronic progressive condition that does  limit progress. However he has been adherent to his HEP and does report feeling better with skilled PT Intervention. He would benefit from additional skilled PT Intervention to improve strength, balance and gait safety;     Rehab Potential  Fair    Clinical Impairments Affecting Rehab Potential  motivated; concerned about progressive stenosis with increased numbness in legs;     PT Frequency  2x / week    PT Duration  4 weeks    PT Treatment/Interventions  Aquatic Therapy;Cryotherapy;Electrical Stimulation;Moist Heat;Therapeutic exercise;Therapeutic activities;Functional mobility training;Stair training;Gait training;Balance training;Neuromuscular re-education;Patient/family education;Orthotic Fit/Training;Manual techniques;Energy conservation    PT Next Visit Plan  Advance HEP, progress resistance and strength training    PT Home Exercise Plan  seated marching, hamstring curls, and hip  abduction with green tband; trunk rotation and single knee to chest in AM and PM, Standing mini squats    Consulted and Agree with Plan of Care  Patient       Patient will benefit from skilled therapeutic intervention in order to improve the following deficits and impairments:  Decreased endurance, Decreased activity tolerance, Decreased strength, Pain, Decreased balance, Decreased mobility, Difficulty walking, Postural dysfunction, Impaired flexibility, Decreased safety awareness  Visit Diagnosis: Muscle weakness (generalized)  Other abnormalities of gait and mobility     Problem List Patient Active Problem List   Diagnosis Date Noted  . Acute ST elevation myocardial infarction (STEMI) of anterolateral wall ([place])   . Angio-edema   . Nonsustained ventricular tachycardia ([place])   . Acute respiratory failure ([place])   . Airway obstruction   . Macroglossia   . Non-STEMI (non-ST elevated myocardial infarction) ([place])   . Anaphylaxis 09/30/2016    Trotter,Margaret PT, DPT 05/12/2018, 2:38 PM  [place] MAIN [facility] SERVICES [address] [place], [place], [address] Phone: [phone]   Fax:  [phone]  Name: Donald Riley MRN: [id number] Date of Birth: 1933-04-22

## 2018-05-17 ENCOUNTER — Ambulatory Visit: Payer: Medicare Other | Admitting: Physical Therapy

## 2018-05-19 ENCOUNTER — Encounter: Payer: Self-pay | Admitting: Physical Therapy

## 2018-05-19 ENCOUNTER — Ambulatory Visit: Payer: Medicare Other

## 2018-05-19 DIAGNOSIS — R2689 Other abnormalities of gait and mobility: Secondary | ICD-10-CM

## 2018-05-19 DIAGNOSIS — M6281 Muscle weakness (generalized): Secondary | ICD-10-CM

## 2018-05-19 NOTE — Therapy (Signed)
[place] MAIN [facility] SERVICES [address] [place], [place], [address] Phone: [phone]   Fax:  [phone]  Physical Therapy Treatment  Patient Details  Name: Donald Riley MRN: [id number] Date of Birth: July 25, 1933 Referring Provider (PT): Hortencia Pilar MD   Encounter Date: 05/19/2018  PT End of Session - 05/19/18 1023    Visit Number  26    Number of Visits  41    Date for PT Re-Evaluation  06/09/18    Authorization Type  5/10  progress note    PT Start Time  1015    PT Stop Time  1100    PT Time Calculation (min)  45 min    Equipment Utilized During Treatment  Gait belt    Activity Tolerance  Patient tolerated treatment well    Behavior During Therapy  WFL for tasks assessed/performed       Past Medical History:  Diagnosis Date  . Allergy to alpha-gal   . Arthritis   . Asthma   . Atrial fibrillation ([place])   . Cancer ([place]) 2002  . GERD (gastroesophageal reflux disease)   . Glaucoma   . Gout   . Heart murmur   . Hypertension   . Neuropathy   . Osteoporosis   . Sleep apnea     Past Surgical History:  Procedure Laterality Date  . ARTHROPLASTY Right    ankle  . BUNIONECTOMY    . CARDIAC CATHETERIZATION N/A 07/02/2015   Procedure: Right/Left Heart Cath and Coronary Angiography;  Surgeon: Teodoro Spray, MD;  Location: [place] [facility];  Service: Cardiovascular;  Laterality: N/A;  . CORONARY/GRAFT ACUTE MI REVASCULARIZATION N/A 10/02/2016   Procedure: Coronary/Graft Acute MI Revascularization;  Surgeon: Wellington Hampshire, MD;  Location: [place] [facility];  Service: Cardiovascular;  Laterality: N/A;  . JOINT REPLACEMENT    . ostatectomy      There were no vitals filed for this visit.  Subjective Assessment - 05/19/18 1021    Subjective  Patient reported that he has mild pain today. Also came to clinic with new quad cane, PT adjusted height and side so the patient could carry quad cane on left.    Pertinent History   85 yo Male reports increased weakness in BLE over last 6 months. Patient has a history of chronic low back pain with radicular symptoms. He is s/p L1/L2, L5/S1 non-surgical fusion with degenerative disc disease. In addition CT of 2017 shows a moderate-severe central spinal stenosis and severe facet disease. He denies any surgery for lumbar spine; He reports spine center reports that he is not a surgical candidate as the spine is too severe.  He has been to a chiropractor in the past with no relief. Patient also has a diagnosis of polyneuropathy which could be contributing to his symptoms. He is ambulating with a SPC. He will be following up with spine surgeon and possibly get steroid injection. He has had steriod injections in the past with mixed results.     Limitations  Other (comment)    How long can you sit comfortably?  NA    How long can you stand comfortably?  20-30 min, he reports increased numbness in legs after a few minutes    How long can you walk comfortably?  He walks with cane/walker, takes longer time, able to walk >500 feet; has a harder time walking downhill    Diagnostic tests  MRI shows multiple lumbar segment stenosis/facet degeneration  Patient Stated Goals  Improve walking, increase strength in legs, feeling more steady;     Currently in Pain?  Yes    Pain Score  1     Pain Orientation  Lower    Pain Descriptors / Indicators  Aching;Sore       TREATMENT:  Hooklying on mat table concurrent with moist heat to low back: Lumbar trunk rotation x10 reps bilaterally; Single knee to chest stretch 20 sec hold x3 reps bilaterally; Hamstring neural stretch with SLR ankle DF/PF neural flossing2 x10 reps bilaterally; -SLR hip flexion x10 bilaterally, 2# ankle weight with min VCs for positioning including to have contralateral LE bent for less back discomfort; Bridges with arms by side 2x15 reps with cues to avoid painful ROM; Patient required min-moderate verbal/tactile cues for  correct exercise techniqueincluding to increase AROM for better strengthening;. Sidelying: Hip abduction 2# x10 bilaterally, required min VCs for correct positioning;  Leg press: BLE plate#6, 0X83 with cues for correct positioning for optimum muscle activation; Hamstring curl, BLE plate #6, 3X83 with min VCs for correct positioning for better LE strengthening; BUE low row with separate handles plate #2, A91 plate 3# B16 with min VCs for positioning and to increase scapular retraction for better upper back strengthening;    PT Education - 05/19/18 1022    Education Details  strengthening, exercise technique/form    Person(s) Educated  Patient    Methods  Explanation;Tactile cues;Verbal cues    Comprehension  Verbalized understanding;Returned demonstration;Verbal cues required       PT Short Term Goals - 05/12/18 1359      PT SHORT TERM GOAL #1   Title  Patient will be adherent to HEP at least 3x a week to improve functional strength and balance for better safety at home.    Baseline  Performing exercises everyday    Time  4    Period  Weeks    Status  Achieved      PT SHORT TERM GOAL #2   Title  Patient will be able to transfer sit<>Stand from a regular height chair with 1 UE assist or less, independent to improve ability to get up out of chairs/couch.    Time  4    Period  Weeks    Status  Achieved        PT Long Term Goals - 05/12/18 1359      PT LONG TERM GOAL #1   Title  Patient will increase BLE gross strength to 4+/5 as to improve functional strength for independent gait, increased standing tolerance and increased ADL ability.    Time  4    Period  Weeks    Status  Achieved      PT LONG TERM GOAL #2   Title  Patient will increase 10 meter walk test to >1.50ms as to improve gait speed for better community ambulation and to reduce fall risk.    Time  4    Period  Weeks    Status  Partially Met    Target Date  06/09/18      PT LONG TERM GOAL #3   Title   Patient (> 826years old) will complete five times sit to stand test in < 15 seconds indicating an increased LE strength and improved balance.    Time  4    Period  Weeks    Status  Partially Met    Target Date  06/09/18      PT LONG TERM GOAL #  4   Title  Patient will be mod I when negotiating a curb with LRAD to exhibit improved gait ability and safety in community.     Time  4    Period  Weeks    Status  Partially Met    Target Date  06/09/18            Plan - 05/19/18 1107    Clinical Impression Statement  Patient reported mild pain at start of session, that decreased to no pain at end of session post exercises. Patient still needs minimal-mod verbal/tactile cues to maintain exercise tehcnique and form and proper muscle activation. Most challenged by s/l hip abduction, complained and demonstrated more weakness in RLE than LLE. Overall the patient demonstrated good tolerance to activity today and would benefit from further skilled PT to continue to maximize functional mobility.     Clinical Impairments Affecting Rehab Potential  motivated; concerned about progressive stenosis with increased numbness in legs;     PT Frequency  2x / week    PT Duration  4 weeks    PT Treatment/Interventions  Aquatic Therapy;Cryotherapy;Electrical Stimulation;Moist Heat;Therapeutic exercise;Therapeutic activities;Functional mobility training;Stair training;Gait training;Balance training;Neuromuscular re-education;Patient/family education;Orthotic Fit/Training;Manual techniques;Energy conservation    PT Next Visit Plan  Advance HEP, progress resistance and strength training    PT Home Exercise Plan  seated marching, hamstring curls, and hip abduction with green tband; trunk rotation and single knee to chest in AM and PM, Standing mini squats    Consulted and Agree with Plan of Care  Patient       Patient will benefit from skilled therapeutic intervention in order to improve the following deficits and  impairments:  Decreased endurance, Decreased activity tolerance, Decreased strength, Pain, Decreased balance, Decreased mobility, Difficulty walking, Postural dysfunction, Impaired flexibility, Decreased safety awareness  Visit Diagnosis: Muscle weakness (generalized)  Other abnormalities of gait and mobility     Problem List Patient Active Problem List   Diagnosis Date Noted  . Acute ST elevation myocardial infarction (STEMI) of anterolateral wall ([place])   . Angio-edema   . Nonsustained ventricular tachycardia ([place])   . Acute respiratory failure ([place])   . Airway obstruction   . Macroglossia   . Non-STEMI (non-ST elevated myocardial infarction) ([place])   . Anaphylaxis 09/30/2016    Lieutenant Diego PT, DPT [phone] PM,05/19/18 [place] MAIN [facility] SERVICES [address] [place], [place], [address] Phone: [phone]   Fax:  [phone]  Name: YUJI WALTH MRN: [id number] Date of Birth: 1934-01-19

## 2018-05-24 ENCOUNTER — Ambulatory Visit: Payer: Medicare Other

## 2018-05-24 ENCOUNTER — Encounter: Payer: Self-pay | Admitting: Physical Therapy

## 2018-05-24 DIAGNOSIS — M6281 Muscle weakness (generalized): Secondary | ICD-10-CM

## 2018-05-24 DIAGNOSIS — R2689 Other abnormalities of gait and mobility: Secondary | ICD-10-CM

## 2018-05-24 NOTE — Therapy (Signed)
[place] MAIN [facility] SERVICES [address] [place], [place], [address] Phone: [phone]   Fax:  [phone]  Physical Therapy Treatment  Patient Details  Name: Donald Riley MRN: [id number] Date of Birth: [birth year] Referring Provider (PT): Hortencia Pilar MD   Encounter Date: 05/24/2018  PT End of Session - 05/24/18 1057    Visit Number  27    Number of Visits  41    Date for PT Re-Evaluation  06/09/18    Authorization Type  6/10  progress note    PT Start Time  1100    PT Stop Time  1144    PT Time Calculation (min)  44 min    Equipment Utilized During Treatment  Gait belt    Activity Tolerance  Patient tolerated treatment well    Behavior During Therapy  WFL for tasks assessed/performed       Past Medical History:  Diagnosis Date  . Allergy to alpha-gal   . Arthritis   . Asthma   . Atrial fibrillation ([place])   . Cancer ([place]) 2002  . GERD (gastroesophageal reflux disease)   . Glaucoma   . Gout   . Heart murmur   . Hypertension   . Neuropathy   . Osteoporosis   . Sleep apnea     Past Surgical History:  Procedure Laterality Date  . ARTHROPLASTY Right    ankle  . BUNIONECTOMY    . CARDIAC CATHETERIZATION N/A 07/02/2015   Procedure: Right/Left Heart Cath and Coronary Angiography;  Surgeon: Teodoro Spray, MD;  Location: [place] [facility];  Service: Cardiovascular;  Laterality: N/A;  . CORONARY/GRAFT ACUTE MI REVASCULARIZATION N/A 10/02/2016   Procedure: Coronary/Graft Acute MI Revascularization;  Surgeon: Wellington Hampshire, MD;  Location: [place] [facility];  Service: Cardiovascular;  Laterality: N/A;  . JOINT REPLACEMENT    . ostatectomy      There were no vitals filed for this visit.  Subjective Assessment - 05/24/18 1104    Subjective  Patient reported that at home getting up this morning he had some R hip pain, but it has resolved prior to his PT appointment. Thinks therapy is going well overalll, and is happy with  his "slow going progress".    Pertinent History  85 yo Male reports increased weakness in BLE over last 6 months. Patient has a history of chronic low back pain with radicular symptoms. He is s/p L1/L2, L5/S1 non-surgical fusion with degenerative disc disease. In addition CT of 2017 shows a moderate-severe central spinal stenosis and severe facet disease. He denies any surgery for lumbar spine; He reports spine center reports that he is not a surgical candidate as the spine is too severe.  He has been to a chiropractor in the past with no relief. Patient also has a diagnosis of polyneuropathy which could be contributing to his symptoms. He is ambulating with a SPC. He will be following up with spine surgeon and possibly get steroid injection. He has had steriod injections in the past with mixed results.     Limitations  Other (comment)    How long can you sit comfortably?  NA    How long can you stand comfortably?  20-30 min, he reports increased numbness in legs after a few minutes    How long can you walk comfortably?  He walks with cane/walker, takes longer time, able to walk >500 feet; has a harder time walking downhill    Diagnostic tests  MRI  shows multiple lumbar segment stenosis/facet degeneration    Patient Stated Goals  Improve walking, increase strength in legs, feeling more steady;     Currently in Pain?  No/denies      TREATMENT:  Hooklying on mat table concurrent with moist heat to low back:   Lumbar trunk rotation x10 reps bilaterally; Single knee to chest stretch 45 sec hold x3 reps bilaterally; Hamstring neural stretch with SLR ankle DF/PF neural flossing 2 x10 reps bilaterally; -SLR hip flexion 2 x10 bilaterally, 3# ankle weight with min VCs for positioning including to have contralateral LE bent for less back discomfort;  Bridges with 6# weight on hips to increase resistance x10 reps with cues to avoid painful ROM;  Patient required min verbal/tactile cues for correct exercise  technique including to increase AROM for better strengthening; . Sidelying: Hip abduction 3# x10 bilaterally, required min VCs for correct positioning;  Leg press: BLE plate #7, 2 L49  with cues for correct positioning for optimum muscle activation;  Hamstring curl, BLE plate #6 Z79 #7 X50.  with min VCs for correct positioning for better LE strengthening; BUE low row with separate handles plate #3, V69 p with min VCs for positioning and to increase scapular retraction for better upper back strengthening; Patient needs supervision/CGA to maintain safety getting on/off machines.     PT Education - 05/24/18 1057    Education Details  strengthening, exercise form and technique    Person(s) Educated  Patient    Methods  Explanation;Tactile cues;Verbal cues    Comprehension  Verbalized understanding;Returned demonstration;Verbal cues required       PT Short Term Goals - 05/12/18 1359      PT SHORT TERM GOAL #1   Title  Patient will be adherent to HEP at least 3x a week to improve functional strength and balance for better safety at home.    Baseline  Performing exercises everyday    Time  4    Period  Weeks    Status  Achieved      PT SHORT TERM GOAL #2   Title  Patient will be able to transfer sit<>Stand from a regular height chair with 1 UE assist or less, independent to improve ability to get up out of chairs/couch.    Time  4    Period  Weeks    Status  Achieved        PT Long Term Goals - 05/12/18 1359      PT LONG TERM GOAL #1   Title  Patient will increase BLE gross strength to 4+/5 as to improve functional strength for independent gait, increased standing tolerance and increased ADL ability.    Time  4    Period  Weeks    Status  Achieved      PT LONG TERM GOAL #2   Title  Patient will increase 10 meter walk test to >1.8ms as to improve gait speed for better community ambulation and to reduce fall risk.    Time  4    Period  Weeks    Status  Partially Met     Target Date  06/09/18      PT LONG TERM GOAL #3   Title  Patient (> 675years old) will complete five times sit to stand test in < 15 seconds indicating an increased LE strength and improved balance.    Time  4    Period  Weeks    Status  Partially Met  Target Date  06/09/18      PT LONG TERM GOAL #4   Title  Patient will be mod I when negotiating a curb with LRAD to exhibit improved gait ability and safety in community.     Time  4    Period  Weeks    Status  Partially Met    Target Date  06/09/18            Plan - 05/24/18 1111    Clinical Impression Statement  Patient excellent tolerance to progress of strengthening program today, no complaints of pain. Patient still needs minimal verbal cues to maintain exercise technique/form. Patient and PT spent time discussing follow up plans after PT for continueing/maintaining progress, whether forever fit or access to the hospital gym is preferred. Pt open to both, will consider his options.    Rehab Potential  Fair    Clinical Impairments Affecting Rehab Potential  motivated; concerned about progressive stenosis with increased numbness in legs;     PT Frequency  2x / week    PT Duration  4 weeks    PT Treatment/Interventions  Aquatic Therapy;Cryotherapy;Electrical Stimulation;Moist Heat;Therapeutic exercise;Therapeutic activities;Functional mobility training;Stair training;Gait training;Balance training;Neuromuscular re-education;Patient/family education;Orthotic Fit/Training;Manual techniques;Energy conservation    PT Next Visit Plan  Advance HEP, progress resistance and strength training    PT Home Exercise Plan  seated marching, hamstring curls, and hip abduction with green tband; trunk rotation and single knee to chest in AM and PM, Standing mini squats    Consulted and Agree with Plan of Care  Patient       Patient will benefit from skilled therapeutic intervention in order to improve the following deficits and impairments:   Decreased endurance, Decreased activity tolerance, Decreased strength, Pain, Decreased balance, Decreased mobility, Difficulty walking, Postural dysfunction, Impaired flexibility, Decreased safety awareness  Visit Diagnosis: Muscle weakness (generalized)  Other abnormalities of gait and mobility     Problem List Patient Active Problem List   Diagnosis Date Noted  . Acute ST elevation myocardial infarction (STEMI) of anterolateral wall ([place])   . Angio-edema   . Nonsustained ventricular tachycardia ([place])   . Acute respiratory failure ([place])   . Airway obstruction   . Macroglossia   . Non-STEMI (non-ST elevated myocardial infarction) ([place])   . Anaphylaxis 09/30/2016    Lieutenant Diego PT, DPT 11:52 AM,05/24/18 [place] MAIN [facility] SERVICES [address] [place], [place], [address] Phone: [phone]   Fax:  [phone]  Name: Donald Riley MRN: [id number] Date of Birth: 1933-11-23

## 2018-05-26 ENCOUNTER — Ambulatory Visit: Payer: Medicare Other

## 2018-05-26 DIAGNOSIS — M6281 Muscle weakness (generalized): Secondary | ICD-10-CM

## 2018-05-26 DIAGNOSIS — R2689 Other abnormalities of gait and mobility: Secondary | ICD-10-CM

## 2018-05-26 NOTE — Therapy (Signed)
[place] MAIN [facility] SERVICES [address] [place], [place], [address] Phone: [phone]   Fax:  [phone]  Physical Therapy Treatment  Patient Details  Name: Donald Riley MRN: [id number] Date of Birth: May 13, 1933 Referring Provider (PT): Hortencia Pilar MD   Encounter Date: 05/26/2018  PT End of Session - 05/26/18 1429    Visit Number  28    Number of Visits  41    Date for PT Re-Evaluation  06/09/18    Authorization Type  7/10  progress note    PT Start Time  1430    PT Stop Time  1520    PT Time Calculation (min)  50 min    Equipment Utilized During Treatment  Gait belt    Activity Tolerance  Patient tolerated treatment well    Behavior During Therapy  WFL for tasks assessed/performed       Past Medical History:  Diagnosis Date  . Allergy to alpha-gal   . Arthritis   . Asthma   . Atrial fibrillation ([place])   . Cancer ([place]) 2002  . GERD (gastroesophageal reflux disease)   . Glaucoma   . Gout   . Heart murmur   . Hypertension   . Neuropathy   . Osteoporosis   . Sleep apnea     Past Surgical History:  Procedure Laterality Date  . ARTHROPLASTY Right    ankle  . BUNIONECTOMY    . CARDIAC CATHETERIZATION N/A 07/02/2015   Procedure: Right/Left Heart Cath and Coronary Angiography;  Surgeon: Teodoro Spray, MD;  Location: [place] [facility];  Service: Cardiovascular;  Laterality: N/A;  . CORONARY/GRAFT ACUTE MI REVASCULARIZATION N/A 10/02/2016   Procedure: Coronary/Graft Acute MI Revascularization;  Surgeon: Wellington Hampshire, MD;  Location: [place] [facility];  Service: Cardiovascular;  Laterality: N/A;  . JOINT REPLACEMENT    . ostatectomy      There were no vitals filed for this visit.  Subjective Assessment - 05/26/18 1614    Subjective  Patient stated that his pain is a 3/10 today, and is sore to the touch in his R lower back. Stated he also had some increased pain last night. Has not performed his stretches yet  today.     Pertinent History  85 yo Male reports increased weakness in BLE over last 6 months. Patient has a history of chronic low back pain with radicular symptoms. He is s/p L1/L2, L5/S1 non-surgical fusion with degenerative disc disease. In addition CT of 2017 shows a moderate-severe central spinal stenosis and severe facet disease. He denies any surgery for lumbar spine; He reports spine center reports that he is not a surgical candidate as the spine is too severe.  He has been to a chiropractor in the past with no relief. Patient also has a diagnosis of polyneuropathy which could be contributing to his symptoms. He is ambulating with a SPC. He will be following up with spine surgeon and possibly get steroid injection. He has had steriod injections in the past with mixed results.     Limitations  Other (comment)    How long can you sit comfortably?  NA    How long can you stand comfortably?  20-30 min, he reports increased numbness in legs after a few minutes    How long can you walk comfortably?  He walks with cane/walker, takes longer time, able to walk >500 feet; has a harder time walking downhill    Diagnostic tests  MRI shows  multiple lumbar segment stenosis/facet degeneration    Patient Stated Goals  Improve walking, increase strength in legs, feeling more steady;     Currently in Pain?  Yes    Pain Score  3     Pain Location  Back    Pain Orientation  Lower    Pain Descriptors / Indicators  Aching;Sore    Pain Onset  More than a month ago        TREATMENT:  Therapeutic Exercise: Hooklying on mat table concurrent with moist heat to low back:   Lumbar trunk rotation x10 reps bilaterally; Single knee to chest stretch 45 sec hold x3 reps bilaterally;  Leg press: BLE plate #7, 2 F75  with cues for correct positioning for optimum muscle activation;  Single leg press plate 4# Z02 bilaterally Toe raises x10 plate #4  Hamstring curl, BLE plate #6 H85 #6 I77.  with min VCs for correct  positioning for better LE strengthening; BUE low row with separate handles plate #3, x6  with min VCs for positioning and to increase scapular retraction for better upper back strengthening;  Patient needs supervision/CGA to maintain safety getting on/off machines and assist to adjust machines.    PT Education - 05/26/18 1615    Education Details  strengthening, exercise form/technique    Person(s) Educated  Patient    Methods  Explanation;Tactile cues;Demonstration    Comprehension  Verbalized understanding;Returned demonstration;Verbal cues required       PT Short Term Goals - 05/12/18 1359      PT SHORT TERM GOAL #1   Title  Patient will be adherent to HEP at least 3x a week to improve functional strength and balance for better safety at home.    Baseline  Performing exercises everyday    Time  4    Period  Weeks    Status  Achieved      PT SHORT TERM GOAL #2   Title  Patient will be able to transfer sit<>Stand from a regular height chair with 1 UE assist or less, independent to improve ability to get up out of chairs/couch.    Time  4    Period  Weeks    Status  Achieved        PT Long Term Goals - 05/12/18 1359      PT LONG TERM GOAL #1   Title  Patient will increase BLE gross strength to 4+/5 as to improve functional strength for independent gait, increased standing tolerance and increased ADL ability.    Time  4    Period  Weeks    Status  Achieved      PT LONG TERM GOAL #2   Title  Patient will increase 10 meter walk test to >1.59ms as to improve gait speed for better community ambulation and to reduce fall risk.    Time  4    Period  Weeks    Status  Partially Met    Target Date  06/09/18      PT LONG TERM GOAL #3   Title  Patient (> 663years old) will complete five times sit to stand test in < 15 seconds indicating an increased LE strength and improved balance.    Time  4    Period  Weeks    Status  Partially Met    Target Date  06/09/18      PT LONG  TERM GOAL #4   Title  Patient will be mod I when negotiating  a curb with LRAD to exhibit improved gait ability and safety in community.     Time  4    Period  Weeks    Status  Partially Met    Target Date  06/09/18            Plan - 05/26/18 1619    Clinical Impression Statement  Patient very motivated to continue with strengthening program today. Reported pain decreased from 3/10 to .5/10. PT and pt spent time again discussing options moving foward in order to maintain progress and continued strengthening program for overall health. Pt stated he would also like to try the treadmill next session.    Rehab Potential  Fair    Clinical Impairments Affecting Rehab Potential  motivated; concerned about progressive stenosis with increased numbness in legs;     PT Frequency  2x / week    PT Duration  4 weeks    PT Treatment/Interventions  Aquatic Therapy;Cryotherapy;Electrical Stimulation;Moist Heat;Therapeutic exercise;Therapeutic activities;Functional mobility training;Stair training;Gait training;Balance training;Neuromuscular re-education;Patient/family education;Orthotic Fit/Training;Manual techniques;Energy conservation    PT Next Visit Plan  Advance HEP, progress resistance and strength training    PT Home Exercise Plan  seated marching, hamstring curls, and hip abduction with green tband; trunk rotation and single knee to chest in AM and PM, Standing mini squats    Consulted and Agree with Plan of Care  Patient       Patient will benefit from skilled therapeutic intervention in order to improve the following deficits and impairments:  Decreased endurance, Decreased activity tolerance, Decreased strength, Pain, Decreased balance, Decreased mobility, Difficulty walking, Postural dysfunction, Impaired flexibility, Decreased safety awareness  Visit Diagnosis: Muscle weakness (generalized)  Other abnormalities of gait and mobility     Problem List Patient Active Problem List    Diagnosis Date Noted  . Acute ST elevation myocardial infarction (STEMI) of anterolateral wall ([place])   . Angio-edema   . Nonsustained ventricular tachycardia ([place])   . Acute respiratory failure ([place])   . Airway obstruction   . Macroglossia   . Non-STEMI (non-ST elevated myocardial infarction) ([place])   . Anaphylaxis 09/30/2016    Lieutenant Diego PT, DPT 4:21 PM,05/26/18 [place] MAIN [facility] SERVICES [address] [place], [place], [address] Phone: [phone]   Fax:  [phone]  Name: MORT SMELSER MRN: [id number] Date of Birth: 03-May-1933

## 2018-05-30 ENCOUNTER — Ambulatory Visit: Payer: Medicare Other | Attending: Family Medicine

## 2018-05-30 ENCOUNTER — Encounter: Payer: Self-pay | Admitting: Physical Therapy

## 2018-05-30 DIAGNOSIS — R2689 Other abnormalities of gait and mobility: Secondary | ICD-10-CM | POA: Diagnosis present

## 2018-05-30 DIAGNOSIS — M6281 Muscle weakness (generalized): Secondary | ICD-10-CM | POA: Diagnosis not present

## 2018-05-30 NOTE — Therapy (Signed)
[place] MAIN [facility] SERVICES [address] [place], [place], [address] Phone: [phone]   Fax:  [phone]  Physical Therapy Treatment  Patient Details  Name: Donald Riley MRN: [id number] Date of Birth: 1933/08/09 Referring Provider (PT): Hortencia Pilar MD   Encounter Date: 05/30/2018  PT End of Session - 05/30/18 1355    Visit Number  29    Number of Visits  41    Date for PT Re-Evaluation  06/09/18    Authorization Type  8/10  progress note    PT Start Time  1300    PT Stop Time  1347    PT Time Calculation (min)  47 min    Equipment Utilized During Treatment  Gait belt    Activity Tolerance  Patient tolerated treatment well    Behavior During Therapy  WFL for tasks assessed/performed       Past Medical History:  Diagnosis Date  . Allergy to alpha-gal   . Arthritis   . Asthma   . Atrial fibrillation ([place])   . Cancer ([place]) 2002  . GERD (gastroesophageal reflux disease)   . Glaucoma   . Gout   . Heart murmur   . Hypertension   . Neuropathy   . Osteoporosis   . Sleep apnea     Past Surgical History:  Procedure Laterality Date  . ARTHROPLASTY Right    ankle  . BUNIONECTOMY    . CARDIAC CATHETERIZATION N/A 07/02/2015   Procedure: Right/Left Heart Cath and Coronary Angiography;  Surgeon: Teodoro Spray, MD;  Location: [place] [facility];  Service: Cardiovascular;  Laterality: N/A;  . CORONARY/GRAFT ACUTE MI REVASCULARIZATION N/A 10/02/2016   Procedure: Coronary/Graft Acute MI Revascularization;  Surgeon: Wellington Hampshire, MD;  Location: [place] [facility];  Service: Cardiovascular;  Laterality: N/A;  . JOINT REPLACEMENT    . ostatectomy      There were no vitals filed for this visit.  Subjective Assessment - 05/30/18 1352    Subjective  Patient reported this AM when he woke up his pain was 3/10. With stretching and getting moving it decreased to 1/10.    Pertinent History  85 yo Male reports increased weakness in BLE  over last 6 months. Patient has a history of chronic low back pain with radicular symptoms. He is s/p L1/L2, L5/S1 non-surgical fusion with degenerative disc disease. In addition CT of 2017 shows a moderate-severe central spinal stenosis and severe facet disease. He denies any surgery for lumbar spine; He reports spine center reports that he is not a surgical candidate as the spine is too severe.  He has been to a chiropractor in the past with no relief. Patient also has a diagnosis of polyneuropathy which could be contributing to his symptoms. He is ambulating with a SPC. He will be following up with spine surgeon and possibly get steroid injection. He has had steriod injections in the past with mixed results.     Limitations  Other (comment)    How long can you sit comfortably?  NA    How long can you stand comfortably?  20-30 min, he reports increased numbness in legs after a few minutes    How long can you walk comfortably?  He walks with cane/walker, takes longer time, able to walk >500 feet; has a harder time walking downhill    Diagnostic tests  MRI shows multiple lumbar segment stenosis/facet degeneration    Patient Stated Goals  Improve walking, increase strength  in legs, feeling more steady;     Currently in Pain?  Yes    Pain Score  1     Pain Location  Back    Pain Orientation  Lower    Pain Descriptors / Indicators  Aching;Sore    Pain Type  Chronic pain    Pain Onset  More than a month ago       TREATMENT:  Therapeutic Exercise:  Hooklying on mat table concurrent with moist heat to low back:  Lumbar trunk rotation x10 reps bilaterally;  Single knee to chest stretch 45 sec hold x2 reps bilaterally; Hamstring neural stretch withSLR ankle DF/PF neural flossing x10 reps bilaterally;  -SLR hip flexion 2 x10 bilaterally, 3# ankle weight with min VCs for positioning including to have contralateral LE bent for less back discomfort; Bridges with 6# weight on hips to increase  resistancex10 reps with cues to avoid painful ROM;   Sidelying: Hip abduction 3# x10 bilaterally, required mod tactile cues for correct positioning;  Leg press:  BLE plate #7, Y65 with cues for correct positioning for optimum muscle activation;?  Single leg press plate 4# K35 bilaterally  With min verbal cues for machine set up, foot placement Toe raises x20 plate #4 with tactile cues for form Hamstring curl, BLE plate #7 W65 with mod verbal cues for machine set up BUE low row with separate handles plate #3, K81 with min VCs for positioning and to increase scapular retraction for better upper back strengthening;   Patient needs supervision to maintain safety getting on/off machines?and assist to adjust machines.      PT Education - 05/30/18 1353    Education Details  strengthening, exercise form/technique    Person(s) Educated  Patient    Methods  Explanation;Tactile cues;Verbal cues;Demonstration    Comprehension  Verbalized understanding;Returned demonstration       PT Short Term Goals - 05/12/18 1359      PT SHORT TERM GOAL #1   Title  Patient will be adherent to HEP at least 3x a week to improve functional strength and balance for better safety at home.    Baseline  Performing exercises everyday    Time  4    Period  Weeks    Status  Achieved      PT SHORT TERM GOAL #2   Title  Patient will be able to transfer sit<>Stand from a regular height chair with 1 UE assist or less, independent to improve ability to get up out of chairs/couch.    Time  4    Period  Weeks    Status  Achieved        PT Long Term Goals - 05/12/18 1359      PT LONG TERM GOAL #1   Title  Patient will increase BLE gross strength to 4+/5 as to improve functional strength for independent gait, increased standing tolerance and increased ADL ability.    Time  4    Period  Weeks    Status  Achieved      PT LONG TERM GOAL #2   Title  Patient will increase 10 meter walk test to >1.67ms as to  improve gait speed for better community ambulation and to reduce fall risk.    Time  4    Period  Weeks    Status  Partially Met    Target Date  06/09/18      PT LONG TERM GOAL #3   Title  Patient (> 60 years  old) will complete five times sit to stand test in < 15 seconds indicating an increased LE strength and improved balance.    Time  4    Period  Weeks    Status  Partially Met    Target Date  06/09/18      PT LONG TERM GOAL #4   Title  Patient will be mod I when negotiating a curb with LRAD to exhibit improved gait ability and safety in community.     Time  4    Period  Weeks    Status  Partially Met    Target Date  06/09/18            Plan - 05/30/18 1353    Clinical Impression Statement  Patient able to complete all gym machines with supervision today, as well as intermittent verbal cues to ensure proper set up and muscle use. Continues to demonstrate improvement in strengthening program as well as decreased pain with movement. The patient would benefit from further skilled PT to continue to progress towards goals.     Rehab Potential  Fair    Clinical Impairments Affecting Rehab Potential  motivated; concerned about progressive stenosis with increased numbness in legs;     PT Frequency  2x / week    PT Duration  4 weeks    PT Treatment/Interventions  Aquatic Therapy;Cryotherapy;Electrical Stimulation;Moist Heat;Therapeutic exercise;Therapeutic activities;Functional mobility training;Stair training;Gait training;Balance training;Neuromuscular re-education;Patient/family education;Orthotic Fit/Training;Manual techniques;Energy conservation    PT Next Visit Plan  Advance HEP, progress resistance and strength training    PT Home Exercise Plan  seated marching, hamstring curls, and hip abduction with green tband; trunk rotation and single knee to chest in AM and PM, Standing mini squats    Consulted and Agree with Plan of Care  Patient       Patient will benefit from skilled  therapeutic intervention in order to improve the following deficits and impairments:  Decreased endurance, Decreased activity tolerance, Decreased strength, Pain, Decreased balance, Decreased mobility, Difficulty walking, Postural dysfunction, Impaired flexibility, Decreased safety awareness  Visit Diagnosis: Muscle weakness (generalized)  Other abnormalities of gait and mobility     Problem List Patient Active Problem List   Diagnosis Date Noted  . Acute ST elevation myocardial infarction (STEMI) of anterolateral wall ([place])   . Angio-edema   . Nonsustained ventricular tachycardia ([place])   . Acute respiratory failure ([place])   . Airway obstruction   . Macroglossia   . Non-STEMI (non-ST elevated myocardial infarction) ([place])   . Anaphylaxis 09/30/2016    Lieutenant Diego PT, DPT 1:58 PM,05/30/18 [place] MAIN [facility] SERVICES [address] [place], [place], [address] Phone: [phone]   Fax:  [phone]  Name: ROJELIO UHRICH MRN: [id number] Date of Birth: 02/19/1934

## 2018-06-01 ENCOUNTER — Ambulatory Visit: Payer: Medicare Other

## 2018-06-01 DIAGNOSIS — M6281 Muscle weakness (generalized): Secondary | ICD-10-CM | POA: Diagnosis not present

## 2018-06-01 DIAGNOSIS — R2689 Other abnormalities of gait and mobility: Secondary | ICD-10-CM

## 2018-06-01 NOTE — Therapy (Signed)
[place] MAIN [facility] SERVICES [place], [place], [address] Phone: [phone]   Fax:  [phone]  Physical Therapy Treatment  Patient Details  Name: Donald Riley MRN: [id number] Date of Birth: 02-08-1934 Referring Provider (PT): Hortencia Pilar MD   Encounter Date: 06/01/2018  PT End of Session - 06/01/18 1348    Visit Number  30    Number of Visits  41    Date for PT Re-Evaluation  06/09/18    Authorization Type  9/10  progress note    PT Start Time  1301    PT Stop Time  1341    PT Time Calculation (min)  40 min    Activity Tolerance  Patient tolerated treatment well    Behavior During Therapy  [facility] for tasks assessed/performed       Past Medical History:  Diagnosis Date  . Allergy to alpha-gal   . Arthritis   . Asthma   . Atrial fibrillation ([place])   . Cancer ([place]) 2002  . GERD (gastroesophageal reflux disease)   . Glaucoma   . Gout   . Heart murmur   . Hypertension   . Neuropathy   . Osteoporosis   . Sleep apnea     Past Surgical History:  Procedure Laterality Date  . ARTHROPLASTY Right    ankle  . BUNIONECTOMY    . CARDIAC CATHETERIZATION N/A 07/02/2015   Procedure: Right/Left Heart Cath and Coronary Angiography;  Surgeon: Teodoro Spray, MD;  Location: [place] [facility];  Service: Cardiovascular;  Laterality: N/A;  . CORONARY/GRAFT ACUTE MI REVASCULARIZATION N/A 10/02/2016   Procedure: Coronary/Graft Acute MI Revascularization;  Surgeon: Wellington Hampshire, MD;  Location: [place] [facility];  Service: Cardiovascular;  Laterality: N/A;  . JOINT REPLACEMENT    . ostatectomy      There were no vitals filed for this visit.  Subjective Assessment - 06/01/18 1345    Subjective  Patient is doing very well this afternoon, reported no pain, and that his pain has been improved the last couple of days. Stated he didn't even use his walker that last two mornings like he usually does.    Pertinent History  85 yo  Male reports increased weakness in BLE over last 6 months. Patient has a history of chronic low back pain with radicular symptoms. He is s/p L1/L2, L5/S1 non-surgical fusion with degenerative disc disease. In addition CT of 2017 shows a moderate-severe central spinal stenosis and severe facet disease. He denies any surgery for lumbar spine; He reports spine center reports that he is not a surgical candidate as the spine is too severe.  He has been to a chiropractor in the past with no relief. Patient also has a diagnosis of polyneuropathy which could be contributing to his symptoms. He is ambulating with a SPC. He will be following up with spine surgeon and possibly get steroid injection. He has had steriod injections in the past with mixed results.     Limitations  Other (comment)    How long can you sit comfortably?  NA    How long can you stand comfortably?  20-30 min, he reports increased numbness in legs after a few minutes    How long can you walk comfortably?  He walks with cane/walker, takes longer time, able to walk >500 feet; has a harder time walking downhill    Diagnostic tests  MRI shows multiple lumbar segment stenosis/facet degeneration    Patient Stated  Goals  Improve walking, increase strength in legs, feeling more steady;     Currently in Pain?  No/denies        TREATMENT:  Therapeutic Exercise:  Treadmill walking with supervision to assess pt response, tolerance, endurance x5 mins speed 1-1.5MPH.  Leg press:  BLE plate #7, 2 G49 with cues for correct positioning for optimum muscle activation;?  Single leg press plate 4# 3S41 bilaterally  With min verbal cues for machine set up, foot placement Toe raises x20 plate #4 with verbal cues for form Hamstring curl, BLE plate #7 H91 with mod verbal cues for machine set up Hamstring curl sinlg leg plate #4 4C45 ea with verbal cues for form BUE low row with separate handles plate #3, E48 with min VCs for positioning and to increase  scapular retraction for better upper back strengthening;   Patient mod I-supervision to maintain safety getting on/off machines?and assist to adjust machines. Most difficulty with hamstring curl machine.  PT Education - 06/01/18 1346    Education Details  strengthening, machines, exercise techniques    Person(s) Educated  Patient    Methods  Explanation;Verbal cues    Comprehension  Verbalized understanding;Returned demonstration       PT Short Term Goals - 05/12/18 1359      PT SHORT TERM GOAL #1   Title  Patient will be adherent to HEP at least 3x a week to improve functional strength and balance for better safety at home.    Baseline  Performing exercises everyday    Time  4    Period  Weeks    Status  Achieved      PT SHORT TERM GOAL #2   Title  Patient will be able to transfer sit<>Stand from a regular height chair with 1 UE assist or less, independent to improve ability to get up out of chairs/couch.    Time  4    Period  Weeks    Status  Achieved        PT Long Term Goals - 05/12/18 1359      PT LONG TERM GOAL #1   Title  Patient will increase BLE gross strength to 4+/5 as to improve functional strength for independent gait, increased standing tolerance and increased ADL ability.    Time  4    Period  Weeks    Status  Achieved      PT LONG TERM GOAL #2   Title  Patient will increase 10 meter walk test to >1.69ms as to improve gait speed for better community ambulation and to reduce fall risk.    Time  4    Period  Weeks    Status  Partially Met    Target Date  06/09/18      PT LONG TERM GOAL #3   Title  Patient (> 665years old) will complete five times sit to stand test in < 15 seconds indicating an increased LE strength and improved balance.    Time  4    Period  Weeks    Status  Partially Met    Target Date  06/09/18      PT LONG TERM GOAL #4   Title  Patient will be mod I when negotiating a curb with LRAD to exhibit improved gait ability and safety in  community.     Time  4    Period  Weeks    Status  Partially Met    Target Date  06/09/18  Plan - 06/01/18 1347    Clinical Impression Statement  Patient still needed intermittent verbal cues and visual cues to assist with machine set up and exercise technique, but patient very motivated to continue to progress program and become independent. the patient would benefit from further skilled PT for updated HEP, progress note next session, and to improve mobility.     Rehab Potential  Fair    Clinical Impairments Affecting Rehab Potential  motivated; concerned about progressive stenosis with increased numbness in legs;     PT Frequency  2x / week    PT Duration  4 weeks    PT Treatment/Interventions  Aquatic Therapy;Cryotherapy;Electrical Stimulation;Moist Heat;Therapeutic exercise;Therapeutic activities;Functional mobility training;Stair training;Gait training;Balance training;Neuromuscular re-education;Patient/family education;Orthotic Fit/Training;Manual techniques;Energy conservation    PT Next Visit Plan  Advance HEP, progress resistance and strength training    PT Home Exercise Plan  seated marching, hamstring curls, and hip abduction with green tband; trunk rotation and single knee to chest in AM and PM, Standing mini squats       Patient will benefit from skilled therapeutic intervention in order to improve the following deficits and impairments:  Decreased endurance, Decreased activity tolerance, Decreased strength, Pain, Decreased balance, Decreased mobility, Difficulty walking, Postural dysfunction, Impaired flexibility, Decreased safety awareness  Visit Diagnosis: Muscle weakness (generalized)  Other abnormalities of gait and mobility     Problem List Patient Active Problem List   Diagnosis Date Noted  . Acute ST elevation myocardial infarction (STEMI) of anterolateral wall ([place])   . Angio-edema   . Nonsustained ventricular tachycardia ([place])   . Acute  respiratory failure ([place])   . Airway obstruction   . Macroglossia   . Non-STEMI (non-ST elevated myocardial infarction) ([place])   . Anaphylaxis 09/30/2016   Lieutenant Diego PT, DPT 1:51 PM,06/01/18 [place] MAIN [facility] SERVICES [address] [place], [place], [address] Phone: [phone]   Fax:  [phone]  Name: TAKEEM KROTZER MRN: [id number] Date of Birth: 03/21/1934

## 2018-06-06 ENCOUNTER — Ambulatory Visit: Payer: Medicare Other | Admitting: Physical Therapy

## 2018-06-08 ENCOUNTER — Other Ambulatory Visit: Payer: Self-pay

## 2018-06-08 ENCOUNTER — Encounter: Payer: Self-pay | Admitting: Physical Therapy

## 2018-06-08 ENCOUNTER — Ambulatory Visit: Payer: Medicare Other

## 2018-06-08 DIAGNOSIS — M6281 Muscle weakness (generalized): Secondary | ICD-10-CM

## 2018-06-08 DIAGNOSIS — R2689 Other abnormalities of gait and mobility: Secondary | ICD-10-CM

## 2018-06-08 NOTE — Therapy (Signed)
[place] MAIN [facility] SERVICES [address] [place], [place], [address] Phone: [phone]   Fax:  [phone]  Physical Therapy Treatment  Patient Details  Name: Donald Riley MRN: [id number] Date of Birth: Sep 11, 1933 Referring Provider (PT): Hortencia Pilar MD   Encounter Date: 06/08/2018  PT End of Session - 06/08/18 1559    Visit Number  31    Number of Visits  41    Date for PT Re-Evaluation  06/09/18    Authorization Type  10/10 PN    PT Start Time  1301    PT Stop Time  1345    PT Time Calculation (min)  44 min    Equipment Utilized During Treatment  Gait belt    Activity Tolerance  Patient tolerated treatment well    Behavior During Therapy  WFL for tasks assessed/performed       Past Medical History:  Diagnosis Date  . Allergy to alpha-gal   . Arthritis   . Asthma   . Atrial fibrillation ([place])   . Cancer ([place]) 2002  . GERD (gastroesophageal reflux disease)   . Glaucoma   . Gout   . Heart murmur   . Hypertension   . Neuropathy   . Osteoporosis   . Sleep apnea     Past Surgical History:  Procedure Laterality Date  . ARTHROPLASTY Right    ankle  . BUNIONECTOMY    . CARDIAC CATHETERIZATION N/A 07/02/2015   Procedure: Right/Left Heart Cath and Coronary Angiography;  Surgeon: Teodoro Spray, MD;  Location: [place] [facility];  Service: Cardiovascular;  Laterality: N/A;  . CORONARY/GRAFT ACUTE MI REVASCULARIZATION N/A 10/02/2016   Procedure: Coronary/Graft Acute MI Revascularization;  Surgeon: Wellington Hampshire, MD;  Location: [place] [facility];  Service: Cardiovascular;  Laterality: N/A;  . JOINT REPLACEMENT    . ostatectomy      There were no vitals filed for this visit.  Subjective Assessment - 06/08/18 1305    Subjective  Patient stated that his pain is increased today, especially on the R side. Stated his R leg numb, and his R hip hurts.    Pertinent History  85 yo Male reports increased weakness in BLE over  last 6 months. Patient has a history of chronic low back pain with radicular symptoms. He is s/p L1/L2, L5/S1 non-surgical fusion with degenerative disc disease. In addition CT of 2017 shows a moderate-severe central spinal stenosis and severe facet disease. He denies any surgery for lumbar spine; He reports spine center reports that he is not a surgical candidate as the spine is too severe.  He has been to a chiropractor in the past with no relief. Patient also has a diagnosis of polyneuropathy which could be contributing to his symptoms. He is ambulating with a SPC. He will be following up with spine surgeon and possibly get steroid injection. He has had steriod injections in the past with mixed results.     Limitations  Other (comment)    How long can you sit comfortably?  NA    How long can you stand comfortably?  20-30 min, he reports increased numbness in legs after a few minutes    How long can you walk comfortably?  He walks with cane/walker, takes longer time, able to walk >500 feet; has a harder time walking downhill    Diagnostic tests  MRI shows multiple lumbar segment stenosis/facet degeneration    Patient Stated Goals  Improve walking, increase strength  in legs, feeling more steady;     Currently in Pain?  Yes    Pain Score  4     Pain Location  Back    Pain Orientation  Lower    Pain Descriptors / Indicators  Sore;Numbness    Pain Type  Chronic pain    Pain Onset  More than a month ago       TREATMENT: Goals reassessed this session: 10 MWT, 5 times sit to stand, stair navigation  Therapeutic Exercise: Hooklying on mat tableconcurrent with moist heat to low back: Lumbar trunk rotation x10 reps bilaterally; Single knee to chest stretch45sec hold x3reps bilaterally; Hamstring neural stretch withSLR ankle DF/PF neural flossing x10 reps bilaterally; Pt given updated HEP and gym program so patient can begin to prep for self management of condition     PT Education -  06/08/18 1257    Education Details  HEP, POC    Person(s) Educated  Patient    Methods  Explanation;Verbal cues    Comprehension  Verbalized understanding;Returned demonstration       PT Short Term Goals - 06/08/18 1318      PT SHORT TERM GOAL #1   Title  Patient will be adherent to HEP at least 3x a week to improve functional strength and balance for better safety at home.    Baseline  Performing exercises everyday    Time  4    Period  Weeks    Status  Achieved    Target Date  02/17/18      PT SHORT TERM GOAL #2   Title  Patient will be able to transfer sit<>Stand from a regular height chair with 1 UE assist or less, independent to improve ability to get up out of chairs/couch.    Time  4    Period  Weeks    Status  Achieved    Target Date  02/17/18        PT Long Term Goals - 06/08/18 1319      PT LONG TERM GOAL #1   Title  Patient will increase BLE gross strength to 4+/5 as to improve functional strength for independent gait, increased standing tolerance and increased ADL ability.    Time  4    Period  Weeks    Status  Achieved    Target Date  05/12/18      PT LONG TERM GOAL #2   Title  Patient will increase 10 meter walk test to >1.48ms as to improve gait speed for better community ambulation and to reduce fall risk.    Baseline  .44 m/s 06/08/2018    Time  4    Period  Weeks    Status  Partially Met    Target Date  07/06/18      PT LONG TERM GOAL #3   Title  Patient (> 627years old) will complete five times sit to stand test in < 15 seconds indicating an increased LE strength and improved balance.    Baseline  06/08/2018 22.88secs    Time  4    Period  Weeks    Status  Partially Met    Target Date  07/06/18      PT LONG TERM GOAL #4   Title  Patient will be mod I when negotiating a curb with LRAD to exhibit improved gait ability and safety in community.     Baseline  Pt able to perform with unilateral rail, very slow, and unsure of  movement today due to  increased pain.    Time  4    Period  Weeks    Status  Partially Met    Target Date  07/06/18            Plan - 06/08/18 1555    Clinical Impression Statement  Patient goals reassessed this session, demonstrated mild improvement in curb navigation and sit to stands, and decreased gait velocity. Patient with increased pain this session compared to the last several weeks as well, and limited in functional abilities due to this pain. The patient does continue to demonstrate slow but steady progress with PT and would benefit from further skilled PT intervention, especially to monitor response to strength program.    Rehab Potential  Fair    Clinical Impairments Affecting Rehab Potential  motivated; concerned about progressive stenosis with increased numbness in legs;     PT Frequency  2x / week    PT Duration  4 weeks    PT Treatment/Interventions  Aquatic Therapy;Cryotherapy;Electrical Stimulation;Moist Heat;Therapeutic exercise;Therapeutic activities;Functional mobility training;Stair training;Gait training;Balance training;Neuromuscular re-education;Patient/family education;Orthotic Fit/Training;Manual techniques;Energy conservation    PT Next Visit Plan  Advance HEP, progress resistance and strength training    PT Home Exercise Plan  seated marching, hamstring curls, and hip abduction with green tband; trunk rotation and single knee to chest in AM and PM, Standing mini squats    Consulted and Agree with Plan of Care  Patient       Patient will benefit from skilled therapeutic intervention in order to improve the following deficits and impairments:  Decreased endurance, Decreased activity tolerance, Decreased strength, Pain, Decreased balance, Decreased mobility, Difficulty walking, Postural dysfunction, Impaired flexibility, Decreased safety awareness  Visit Diagnosis: Muscle weakness (generalized)  Other abnormalities of gait and mobility     Problem List Patient Active Problem  List   Diagnosis Date Noted  . Acute ST elevation myocardial infarction (STEMI) of anterolateral wall ([place])   . Angio-edema   . Nonsustained ventricular tachycardia ([place])   . Acute respiratory failure ([place])   . Airway obstruction   . Macroglossia   . Non-STEMI (non-ST elevated myocardial infarction) ([place])   . Anaphylaxis 09/30/2016    Lieutenant Diego PT, DPT [phone] PM,06/08/18 [phone]  [place] MAIN [facility] SERVICES [address] [place], [place], [address] Phone: [phone]   Fax:  [phone]  Name: Donald Riley MRN: [id number] Date of Birth: December 15, 1933

## 2018-06-08 NOTE — Addendum Note (Signed)
Addended by: Wyline Beady on: 06/08/2018 04:04 PM   Modules accepted: Orders

## 2018-06-14 ENCOUNTER — Encounter: Payer: Self-pay | Admitting: Physical Therapy

## 2018-06-14 ENCOUNTER — Other Ambulatory Visit: Payer: Self-pay

## 2018-06-14 ENCOUNTER — Ambulatory Visit: Payer: Medicare Other | Admitting: Physical Therapy

## 2018-06-14 DIAGNOSIS — M6281 Muscle weakness (generalized): Secondary | ICD-10-CM | POA: Diagnosis not present

## 2018-06-14 DIAGNOSIS — R2689 Other abnormalities of gait and mobility: Secondary | ICD-10-CM

## 2018-06-14 NOTE — Therapy (Signed)
[place] MAIN [facility] SERVICES [address] [place], [place], [address] Phone: [phone]   Fax:  [phone]  Physical Therapy Treatment  Patient Details  Name: Donald Riley MRN: [id number] Date of Birth: February 27, 1934 Referring Provider (PT): Hortencia Pilar MD   Encounter Date: 06/14/2018  PT End of Session - 06/14/18 1043    Visit Number  32    Number of Visits  41    Date for PT Re-Evaluation  06/09/18    Authorization Type   PN done at last visit; goals modified and re-assessed     PT Start Time  0845    PT Stop Time  0930    PT Time Calculation (min)  45 min    Equipment Utilized During Treatment  Gait belt    Activity Tolerance  Patient tolerated treatment well    Behavior During Therapy  WFL for tasks assessed/performed       Past Medical History:  Diagnosis Date  . Allergy to alpha-gal   . Arthritis   . Asthma   . Atrial fibrillation ([place])   . Cancer ([place]) 2002  . GERD (gastroesophageal reflux disease)   . Glaucoma   . Gout   . Heart murmur   . Hypertension   . Neuropathy   . Osteoporosis   . Sleep apnea     Past Surgical History:  Procedure Laterality Date  . ARTHROPLASTY Right    ankle  . BUNIONECTOMY    . CARDIAC CATHETERIZATION N/A 07/02/2015   Procedure: Right/Left Heart Cath and Coronary Angiography;  Surgeon: Teodoro Spray, MD;  Location: [place] [facility];  Service: Cardiovascular;  Laterality: N/A;  . CORONARY/GRAFT ACUTE MI REVASCULARIZATION N/A 10/02/2016   Procedure: Coronary/Graft Acute MI Revascularization;  Surgeon: Wellington Hampshire, MD;  Location: [place] [facility];  Service: Cardiovascular;  Laterality: N/A;  . JOINT REPLACEMENT    . ostatectomy      There were no vitals filed for this visit.  Subjective Assessment - 06/14/18 1040    Subjective  Pt reports, "My pain in my back and R hip/leg is much better today.  I've had a few good days."    Pertinent History  85 yo Male reports increased  weakness in BLE over last 6 months. Patient has a history of chronic low back pain with radicular symptoms. He is s/p L1/L2, L5/S1 non-surgical fusion with degenerative disc disease. In addition CT of 2017 shows a moderate-severe central spinal stenosis and severe facet disease. He denies any surgery for lumbar spine; He reports spine center reports that he is not a surgical candidate as the spine is too severe.  He has been to a chiropractor in the past with no relief. Patient also has a diagnosis of polyneuropathy which could be contributing to his symptoms. He is ambulating with a SPC. He will be following up with spine surgeon and possibly get steroid injection. He has had steriod injections in the past with mixed results.     How long can you stand comfortably?  20-30 min, he reports increased numbness in legs after a few minutes    How long can you walk comfortably?  He walks with cane/walker, takes longer time, able to walk >500 feet; has a harder time walking downhill    Diagnostic tests  MRI shows multiple lumbar segment stenosis/facet degeneration    Patient Stated Goals  Improve walking, increase strength in legs, feeling more steady;     Currently in  Pain?  No/denies        Treatment:  Therapeutic Exercises:  Nustep L3 x 6 min (unbilled); sit to stands 2x10 (10 reps with ball between knees and 10 reps with GTB around thighs); Forward step ups on 6" step with 2# ankle wts 2x10; Leg Press Machine B 2x10 60#, calf raises B 20x 60#; single leg press 30# 2x10 B; standing marching 2# ankle wts B 2x10; seated heel and toe raises 20x each; forward stepping over hurdles 10x.                       PT Education - 06/14/18 1042    Education Details  Added seated heel and toe raises to HEP today.    Person(s) Educated  Patient    Methods  Explanation;Demonstration    Comprehension  Verbalized understanding;Returned demonstration       PT Short Term Goals - 06/08/18 1318       PT SHORT TERM GOAL #1   Title  Patient will be adherent to HEP at least 3x a week to improve functional strength and balance for better safety at home.    Baseline  Performing exercises everyday    Time  4    Period  Weeks    Status  Achieved    Target Date  02/17/18      PT SHORT TERM GOAL #2   Title  Patient will be able to transfer sit<>Stand from a regular height chair with 1 UE assist or less, independent to improve ability to get up out of chairs/couch.    Time  4    Period  Weeks    Status  Achieved    Target Date  02/17/18        PT Long Term Goals - 06/08/18 1319      PT LONG TERM GOAL #1   Title  Patient will increase BLE gross strength to 4+/5 as to improve functional strength for independent gait, increased standing tolerance and increased ADL ability.    Time  4    Period  Weeks    Status  Achieved    Target Date  05/12/18      PT LONG TERM GOAL #2   Title  Patient will increase 10 meter walk test to >1.76ms as to improve gait speed for better community ambulation and to reduce fall risk.    Baseline  .44 m/s 06/08/2018    Time  4    Period  Weeks    Status  Partially Met    Target Date  07/06/18      PT LONG TERM GOAL #3   Title  Patient (> 664years old) will complete five times sit to stand test in < 15 seconds indicating an increased LE strength and improved balance.    Baseline  06/08/2018 22.88secs    Time  4    Period  Weeks    Status  Partially Met    Target Date  07/06/18      PT LONG TERM GOAL #4   Title  Patient will be mod I when negotiating a curb with LRAD to exhibit improved gait ability and safety in community.     Baseline  Pt able to perform with unilateral rail, very slow, and unsure of movement today due to increased pain.    Time  4    Period  Weeks    Status  Partially Met    Target Date  07/06/18            Plan - 06/14/18 1045    Clinical Impression Statement  Pt demonstrated improved heel strike with ambulation in gym  today with [facility] after cueing and toe raise exercises.  He reported that sit to stand ex was "tiring" and challenging.  Pt demonstrated good foot clearance with simulated curbs over hurdles in // bars today.    Stability/Clinical Decision Making  Evolving/Moderate complexity    Clinical Decision Making  Moderate    Rehab Potential  Fair    Clinical Impairments Affecting Rehab Potential  motivated; concerned about progressive stenosis with increased numbness in legs;     PT Frequency  2x / week    PT Duration  4 weeks    PT Treatment/Interventions  Aquatic Therapy;Cryotherapy;Electrical Stimulation;Moist Heat;Therapeutic exercise;Therapeutic activities;Functional mobility training;Stair training;Gait training;Balance training;Neuromuscular re-education;Patient/family education;Orthotic Fit/Training;Manual techniques;Energy conservation    PT Next Visit Plan  Continue to work on gait activities and LE strengthening    PT [place]  seated marching, hamstring curls, and hip abduction with green tband; trunk rotation and single knee to chest in AM and PM, Standing mini squats; seated heel and toe raises    Consulted and Agree with Plan of Care  Patient       Patient will benefit from skilled therapeutic intervention in order to improve the following deficits and impairments:  Decreased endurance, Decreased activity tolerance, Decreased strength, Pain, Decreased balance, Decreased mobility, Difficulty walking, Postural dysfunction, Impaired flexibility, Decreased safety awareness  Visit Diagnosis: Muscle weakness (generalized)  Other abnormalities of gait and mobility     Problem List Patient Active Problem List   Diagnosis Date Noted  . Acute ST elevation myocardial infarction (STEMI) of anterolateral wall ([place])   . Angio-edema   . Nonsustained ventricular tachycardia ([place])   . Acute respiratory failure ([place])   . Airway obstruction   . Macroglossia   . Non-STEMI (non-ST elevated  myocardial infarction) ([place])   . Anaphylaxis 09/30/2016    Leydy Worthey, MPT 06/14/2018, 10:49 AM  [place] MAIN [facility] SERVICES [address] [place], [place], [address] Phone: [phone]   Fax:  [phone]  Name: Donald Riley MRN: [id number] Date of Birth: [birth year]

## 2018-06-15 NOTE — Progress Notes (Signed)
[facility] day:  06/16/2018  Chief Complaint: Donald Riley is a 85 y.o. male with a monoclonal gammopathy and an elevated alkaline phosphatase who is referred in consultation by Dr. Hortencia Pilar for assessment and management.  HPI:  The patient notes a history of angioedema and anaphylaxis requiring intubation on 09/30/2016.  Course was complicated by an anterior lateral myocardial infarction.  Alpha-gal was markedly elevated and thus diet changed to avoid meat.  There was also a concern for a possible [place] tick bite.    Patient states that he was first told of an abnormal SPEP at this time.  SPEP on 11/24/2016 revealed a region of restricted electrophoretic mobility in IgG lambda of unknown significance.  SPEP on 07/05/2017 could not r/o a faint monoclonal IgG lambda component. SPEP on 05/20/2018 revealed a faint IgG lambda monoclonal gammopathy.  Alkaline phosphatase was 156 (40-129) on 06/08/2018.  Isoenzymes revealed an unusual electrophoretic pattern which may be due to the presence of a possible macro-alkaline phosphatase complex. The clinical significance of this finding is uncertain. Prior alkaline phosphatase was 125 on 05/23/2018 and 86 on 05/27/2017.  CBC has been followed: 11/24/2016:  hematocrit of 48.9, hemoglobin 16.1, MCV 99, platelets 153,000, WBC 8200 with an ANC of 5700.   03/22/2018:  hematocrit of 48.6, hemoglobin 15.3, MCV 103.6, platelets 170,000, WBC 6500 with an ANC of 4500.  CMP on 05/23/2018 revealed a creatinine 1.2, albumen of 3.3, total protein 5.8, and calcium 8.9.  TSH was 1.43 and free T4 was 1.21 (normal).  Creatinine has ranged between 0.9 -1.2 in the last 8 years without trend.  Urinalysis on 04/04/2018 revealed no protein.  B12 was 378 and folate 13.8 on 12/06/2013.  Regarding his diet, he states that he eats fish, seafood, and poultry.  He eats some ice cream.  He eats green leafy vegetables.     Symptomatically, he feels worn out.  He denies any B symptoms.  In 2018, he lost 20 pounds, but his weight has been stable for over a year.  He denies any bone pain except for chronic back pain secondary to degenerative disease for several years.  He is required 2 epidurals.  He has had a neuropathy for over 10 years.  He has seen 3 neurologist over the years as well as 1 neurosurgeon.  He was told he was "inoperable".  He has cervical spine stenosis.  He has had no issues with infections.  He has a history of prostate cancer s/p prostatectomy at [facility] in 2002.  Prostate cancer diagnosed after an elevated PSA.  Gleason was 6.  He states that his disease was localized.  He received no other treatment.  PSA was < 0.01 on 05/23/2018.   Past Medical History:  Diagnosis Date  . Allergy to alpha-gal   . Arthritis   . Asthma   . Atrial fibrillation ([place])   . Cancer ([place]) 2002  . GERD (gastroesophageal reflux disease)   . Glaucoma   . Gout   . Heart murmur   . Hypertension   . Neuropathy   . Osteoporosis   . Sleep apnea     Past Surgical History:  Procedure Laterality Date  . ARTHROPLASTY Right    ankle  . BUNIONECTOMY    . CARDIAC CATHETERIZATION N/A 07/02/2015   Procedure: Right/Left Heart Cath and Coronary Angiography;  Surgeon: Teodoro Spray, MD;  Location: [facility];  Service: Cardiovascular;  Laterality: N/A;  .  CORONARY/GRAFT ACUTE MI REVASCULARIZATION N/A 10/02/2016   Procedure: Coronary/Graft Acute MI Revascularization;  Surgeon: Wellington Hampshire, MD;  Location: [facility];  Service: Cardiovascular;  Laterality: N/A;  . JOINT REPLACEMENT    . ostatectomy      Family History  Problem Relation Age of Onset  . Stroke Mother   . Atrial fibrillation Mother   . Stroke Father   . Atrial fibrillation Father     Social History:  reports that he has never smoked. He has never used smokeless tobacco. He reports current alcohol use. He reports that he  does not use drugs.  He lives in [place] with his wife, Donald Riley (married 1962).  He has a son, daughter, and 5 grandchildren.  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  He worked in [employer] until 2002.  The patient is alone today.  Allergies:  Allergies  Allergen Reactions  . Galactose Swelling and Other (See Comments)  . Gabapentin Other (See Comments)  . Celecoxib Other (See Comments)    Other reaction(s): OTHER Patient experienced increased blood pressure.    Current Medications: Current Outpatient Medications  Medication Sig Dispense Refill  . amiodarone (PACERONE) 200 MG tablet 400 mg daily.     Marland Kitchen aspirin 81 MG chewable tablet Chew 1 tablet (81 mg total) by mouth daily. 30 tablet 2  . brimonidine (ALPHAGAN P) 0.1 % SOLN Alphagan P 0.1 % eye drops    . clopidogrel (PLAVIX) 75 MG tablet Take 1 tablet (75 mg total) by mouth daily with breakfast. 30 tablet 2  . dorzolamide-timolol (COSOPT) 22.3-6.8 MG/ML ophthalmic solution     . EPINEPHrine 0.3 mg/0.3 mL IJ SOAJ injection Inject 0.3 mLs (0.3 mg total) into the muscle once as needed. 6 Device 0  . furosemide (LASIX) 20 MG tablet Take 1 tablet (20 mg total) by mouth daily. 30 tablet 2  . latanoprost (XALATAN) 0.005 % ophthalmic solution Place 1 drop into both eyes at bedtime.     . MULTIPLE VITAMIN PO Take by mouth.    . pantoprazole (PROTONIX) 40 MG tablet Take 40 mg by mouth daily. Reported on 07/02/2015    . pramipexole (MIRAPEX) 0.25 MG tablet Take 0.25-0.5 mg by mouth 2 (two) times daily.     . traMADol (ULTRAM) 50 MG tablet Take 1 tablet (50 mg total) by mouth every 8 (eight) hours as needed. 15 tablet 0  . albuterol (VENTOLIN HFA) 108 (90 BASE) MCG/ACT inhaler Inhale into the lungs every 4 (four) hours as needed.     Marland Kitchen atorvastatin (LIPITOR) 40 MG tablet Take 1 tablet (40 mg total) by mouth daily at 6 PM. (Patient not taking: Reported on 01/20/2018) 30 tablet 2  . baclofen (LIORESAL) 10 MG tablet Take 0.5-1 tablets (5-10  mg total) by mouth 3 (three) times daily as needed for muscle spasms. (Patient not taking: Reported on 06/16/2018) 30 each 0  . metoprolol succinate (TOPROL-XL) 25 MG 24 hr tablet metoprolol succinate ER 25 mg tablet,extended release 24 hr  Take 1 tablet every day by oral route.    . mometasone-formoterol (DULERA) 200-5 MCG/ACT AERO Dulera 200 mcg-5 mcg/actuation HFA aerosol inhaler    . predniSONE (DELTASONE) 20 MG tablet Take 1 tablet (20 mg total) by mouth daily. (Patient not taking: Reported on 06/16/2018) 5 tablet 0  . tiotropium (SPIRIVA HANDIHALER) 18 MCG inhalation capsule Place 18 mcg into inhaler and inhale daily.      No current facility-administered medications for this visit.  Review of Systems:  GENERAL:  Feels "worn out".  No fevers, sweats.  Weight loss of 20 pounds in 2018.  Weight stable x 1 year.  PERFORMANCE STATUS (ECOG):  1-2 HEENT:  Normal pressure glaucoma.  Visual field defect in right eye.  Runny nose "like a sieve".  No sore throat, mouth sores or tenderness. Lungs: Cough with "a lot of crud in the morning" felt secondary to reflux.  Asthma on inhalers.  No shortness of breath.  No hemoptysis. Cardiac:  No chest pain, palpitations, orthopnea, or PND. GI:  Reflux.  No nausea, vomiting, diarrhea, constipation, melena or hematochezia. GU:  Urgency if takes Lasix.  Kidney stone at Christmas.  No frequency, dysuria, or hematuria. Musculoskeletal:  h/o gout in ankle. Chronic back pain secondary to degenerative disease.  No joint pain.  No muscle tenderness. Extremities:  No pain or swelling. Skin:  Bruising on aspirin and Plavix.  No rashes or skin changes. Neuro:  Neuropathy in legs (right > left).  No headache, numbness or weakness, balance or coordination issues. Endocrine:  No diabetes, thyroid issues, hot flashes or night sweats. Psych:  No mood changes, depression or anxiety. Pain:  No focal pain. Review of systems:  All other systems reviewed and found to be  negative.  Physical Exam: Blood pressure (!) 110/52, pulse 73, temperature 98.5 F (36.9 C), temperature source Tympanic, resp. rate 18, height 5\' 8"  (1.727 m), weight 183 lb 15.6 oz (83.4 kg), SpO2 96 %. GENERAL:  Well developed, well nourished, gentleman sitting comfortably in the exam room in no acute distress.  He walks slowly with a 4 point cane to the exam table. MENTAL STATUS:  Alert and oriented to person, place and time. HEAD:  Pearline Cables hair.  Normocephalic, atraumatic, face symmetric, no Cushingoid features. EYES:  Glasses.  Gray-blue eyes.  Pupils equal round and reactive to light and accomodation.  No conjunctivitis or scleral icterus. ENT:  Oropharynx clear without lesion.  Tongue normal. Mucous membranes moist.  RESPIRATORY:  Clear to auscultation without rales, wheezes or rhonchi. CARDIOVASCULAR:  Regular rate and rhythm with a III/VI systolic murmur at the right sternal border c/w aortic stenosis.  No rub or gallop. ABDOMEN:  Soft, non-tender, with active bowel sounds, and no hepatosplenomegaly.  No masses. SKIN:  Upper extremity ecchymosis.  No rashes, ulcers or lesions. EXTREMITIES: No edema, no skin discoloration or tenderness.  No palpable cords. LYMPH NODES: No palpable cervical, supraclavicular, axillary or inguinal adenopathy  NEUROLOGICAL: Unremarkable. PSYCH:  Appropriate.   Office Visit on 06/16/2018  Component Date Value Ref Range Status  . Sodium 06/16/2018 137  135 - 145 mmol/L Final  . Potassium 06/16/2018 4.1  3.5 - 5.1 mmol/L Final  . Chloride 06/16/2018 102  98 - 111 mmol/L Final  . CO2 06/16/2018 29  22 - 32 mmol/L Final  . Glucose, Bld 06/16/2018 109* 70 - 99 mg/dL Final  . BUN 06/16/2018 18  8 - 23 mg/dL Final  . Creatinine, Ser 06/16/2018 0.88  0.61 - 1.24 mg/dL Final  . Calcium 06/16/2018 8.6* 8.9 - 10.3 mg/dL Final  . Total Protein 06/16/2018 7.0  6.5 - 8.1 g/dL Final  . Albumin 06/16/2018 4.0  3.5 - 5.0 g/dL Final  . AST 06/16/2018 27  15 - 41 U/L  Final  . ALT 06/16/2018 20  0 - 44 U/L Final  . Alkaline Phosphatase 06/16/2018 154* 38 - 126 U/L Final  . Total Bilirubin 06/16/2018 0.4  0.3 - 1.2 mg/dL Final  .  GFR calc non Af Amer 06/16/2018 >60  >60 mL/min Final  . GFR calc Af Amer 06/16/2018 >60  >60 mL/min Final  . Anion gap 06/16/2018 6  5 - 15 Final   Performed at [facility], [address], [place], [place] [address]  . WBC 06/16/2018 6.5  4.0 - 10.5 K/uL Final  . RBC 06/16/2018 4.67  4.22 - 5.81 MIL/uL Final  . Hemoglobin 06/16/2018 15.4  13.0 - 17.0 g/dL Final  . HCT 06/16/2018 48.3  39.0 - 52.0 % Final  . MCV 06/16/2018 103.4* 80.0 - 100.0 fL Final  . MCH 06/16/2018 33.0  26.0 - 34.0 pg Final  . MCHC 06/16/2018 31.9  30.0 - 36.0 g/dL Final  . RDW 06/16/2018 14.4  11.5 - 15.5 % Final  . Platelets 06/16/2018 162  150 - 400 K/uL Final  . nRBC 06/16/2018 0.0  0.0 - 0.2 % Final  . Neutrophils Relative % 06/16/2018 75  % Final  . Neutro Abs 06/16/2018 4.9  1.7 - 7.7 K/uL Final  . Lymphocytes Relative 06/16/2018 14  % Final  . Lymphs Abs 06/16/2018 0.9  0.7 - 4.0 K/uL Final  . Monocytes Relative 06/16/2018 9  % Final  . Monocytes Absolute 06/16/2018 0.6  0.1 - 1.0 K/uL Final  . Eosinophils Relative 06/16/2018 1  % Final  . Eosinophils Absolute 06/16/2018 0.1  0.0 - 0.5 K/uL Final  . Basophils Relative 06/16/2018 1  % Final  . Basophils Absolute 06/16/2018 0.1  0.0 - 0.1 K/uL Final  . Immature Granulocytes 06/16/2018 0  % Final  . Abs Immature Granulocytes 06/16/2018 0.02  0.00 - 0.07 K/uL Final   Performed at [facility], [address], [place], [place] [address]    Assessment:  APOSTOLOS BLAGG is a 85 y.o. male with an IgG lambda monoclonal gammopathy and elevated alkaline phosphatase.  SPEP has been followed: region of restricted electrophoretic mobility in IgG lambda of unknown significance on 11/24/2016 and a faint IgG lambda monoclonal gammopathy on 05/20/2018.  Alkaline phosphatase was  156 (40-129) on 06/08/2018.  Isoenzymes revealed an unusual electrophoretic pattern which may be due to the presence of a possible macro-alkaline phosphatase complex. The clinical significance is uncertain. Prior alkaline phosphatase was 125 on 05/23/2018 and 86 on 05/27/2017.  He has a history of localized prostate cancer s/p prostatectomy in 2002.  Gleason was 6.  PSA was < 0.01 on 05/23/2018.  Symptomatically, he feels worn out.  He denies any B symptoms.  He denies any bone pain except for chronic back issues.  He has had no issues with infections.  Exam reveals no adenopathy or hepatosplenomegaly.  Plan: 1.   Labs today:  CBC with diff, CMP, myeloma panel, FLCA. 2.   Collect 24 hour urine for UPEP and free light chains. 3.   Monoclonal gammopathy  Discuss SPEP trend over the past 2 years  Suspect developing monoclonal gammopathy of unknown significance (MGUS).  Discuss CRAB criteria for myeloma:  hypercalcemia, renal dysfunction, anemia, bone lesions.  Discuss laboratory work-up. 4.   Elevated alkaline phosphatase  Discuss prior normal alkaline phosphatase on 05/23/2018 and abnormal alkaline phosphatase on 06/08/2018.  Discuss macro-alkaline phosphatase consisting of complexes of alkaline phosphatase with immunoglobulins.   Complexes can have decreased renal clearance.   Significance unknown.  Discuss repeating alkaline phosphatase and possible fractionation.  5.   Macrocytic RBC indices  RBCs macrocytic since 02/2018.  Thyroid function normal 05/23/2018.  No known liver disease.  Check B12 and folate (last checked 11/2013).  Patient notes dietary change in 2018.  If work-up negative, may have an underlying myelodysplastic syndrome. 6.   Prostate cancer  Patient had localized prostate cancer s/p prostatectomy 2002.  PSA is < 0.01. 7.   RTC in 2 weeks for MD assessment review of work-up and discussion regarding direction of therapy.   Lequita Asal, MD  06/16/2018, 4:28 PM

## 2018-06-16 ENCOUNTER — Inpatient Hospital Stay: Payer: Medicare Other

## 2018-06-16 ENCOUNTER — Encounter: Payer: Self-pay | Admitting: Hematology and Oncology

## 2018-06-16 ENCOUNTER — Other Ambulatory Visit: Payer: Self-pay

## 2018-06-16 ENCOUNTER — Inpatient Hospital Stay: Payer: Medicare Other | Attending: Hematology and Oncology | Admitting: Hematology and Oncology

## 2018-06-16 VITALS — BP 110/52 | HR 73 | Temp 98.5°F | Resp 18 | Ht 68.0 in | Wt 184.0 lb

## 2018-06-16 DIAGNOSIS — Z8546 Personal history of malignant neoplasm of prostate: Secondary | ICD-10-CM | POA: Insufficient documentation

## 2018-06-16 DIAGNOSIS — D7589 Other specified diseases of blood and blood-forming organs: Secondary | ICD-10-CM | POA: Diagnosis not present

## 2018-06-16 DIAGNOSIS — D472 Monoclonal gammopathy: Secondary | ICD-10-CM | POA: Diagnosis not present

## 2018-06-16 DIAGNOSIS — R748 Abnormal levels of other serum enzymes: Secondary | ICD-10-CM | POA: Diagnosis not present

## 2018-06-16 DIAGNOSIS — Z79899 Other long term (current) drug therapy: Secondary | ICD-10-CM | POA: Insufficient documentation

## 2018-06-16 LAB — COMPREHENSIVE METABOLIC PANEL
ALT: 20 U/L (ref 0–44)
AST: 27 U/L (ref 15–41)
Albumin: 4 g/dL (ref 3.5–5.0)
Alkaline Phosphatase: 154 U/L — ABNORMAL HIGH (ref 38–126)
Anion gap: 6 (ref 5–15)
BUN: 18 mg/dL (ref 8–23)
CO2: 29 mmol/L (ref 22–32)
Calcium: 8.6 mg/dL — ABNORMAL LOW (ref 8.9–10.3)
Chloride: 102 mmol/L (ref 98–111)
Creatinine, Ser: 0.88 mg/dL (ref 0.61–1.24)
GFR calc Af Amer: >60 mL/min (ref >60)
GFR calc non Af Amer: >60 mL/min (ref >60)
Glucose, Bld: 109 mg/dL — ABNORMAL HIGH (ref 70–99)
Potassium: 4.1 mmol/L (ref 3.5–5.1)
Sodium: 137 mmol/L (ref 135–145)
Total Bilirubin: 0.4 mg/dL (ref 0.3–1.2)
Total Protein: 7 g/dL (ref 6.5–8.1)

## 2018-06-16 LAB — CBC WITH DIFFERENTIAL/PLATELET
Abs Immature Granulocytes: 0.02 10*3/uL (ref 0.00–0.07)
Basophils Absolute: 0.1 10*3/uL (ref 0.0–0.1)
Basophils Relative: 1 %
Eosinophils Absolute: 0.1 10*3/uL (ref 0.0–0.5)
Eosinophils Relative: 1 %
HCT: 48.3 % (ref 39.0–52.0)
Hemoglobin: 15.4 g/dL (ref 13.0–17.0)
Immature Granulocytes: 0 %
Lymphocytes Relative: 14 %
Lymphs Abs: 0.9 10*3/uL (ref 0.7–4.0)
MCH: 33 pg (ref 26.0–34.0)
MCHC: 31.9 g/dL (ref 30.0–36.0)
MCV: 103.4 fL — ABNORMAL HIGH (ref 80.0–100.0)
Monocytes Absolute: 0.6 10*3/uL (ref 0.1–1.0)
Monocytes Relative: 9 %
Neutro Abs: 4.9 10*3/uL (ref 1.7–7.7)
Neutrophils Relative %: 75 %
Platelets: 162 10*3/uL (ref 150–400)
RBC: 4.67 MIL/uL (ref 4.22–5.81)
RDW: 14.4 % (ref 11.5–15.5)
WBC: 6.5 10*3/uL (ref 4.0–10.5)
nRBC: 0 % (ref 0.0–0.2)

## 2018-06-16 LAB — FOLATE: Folate: 8.2 ng/mL (ref >5.9)

## 2018-06-16 LAB — VITAMIN B12: Vitamin B-12: 224 pg/mL (ref 180–914)

## 2018-06-17 ENCOUNTER — Telehealth: Payer: Self-pay

## 2018-06-17 LAB — MULTIPLE MYELOMA PANEL, SERUM
Albumin SerPl Elph-Mcnc: 3.6 g/dL (ref 2.9–4.4)
Albumin/Glob SerPl: 1.4 (ref 0.7–1.7)
Alpha 1: 0.3 g/dL (ref 0.0–0.4)
Alpha2 Glob SerPl Elph-Mcnc: 0.6 g/dL (ref 0.4–1.0)
B-Globulin SerPl Elph-Mcnc: 0.9 g/dL (ref 0.7–1.3)
Gamma Glob SerPl Elph-Mcnc: 0.9 g/dL (ref 0.4–1.8)
Globulin, Total: 2.6 g/dL (ref 2.2–3.9)
IgA: 176 mg/dL (ref 61–437)
IgG (Immunoglobin G), Serum: 937 mg/dL (ref 700–1600)
IgM (Immunoglobulin M), Srm: 121 mg/dL (ref 15–143)
Total Protein ELP: 6.2 g/dL (ref 6.0–8.5)

## 2018-06-17 LAB — KAPPA/LAMBDA LIGHT CHAINS
Kappa free light chain: 26.9 mg/L — ABNORMAL HIGH (ref 3.3–19.4)
Kappa, lambda light chain ratio: 1.41 (ref 0.26–1.65)
Lambda free light chains: 19.1 mg/L (ref 5.7–26.3)

## 2018-06-17 NOTE — Telephone Encounter (Signed)
-----   Message from Lequita Asal, MD sent at 06/17/2018 12:09 PM EDT ----- Regarding: Please call patient- Dr Juliane Poot is low.  He has B12 deficiency.  Start oral B12 1000 micrograms a day.    We will recheck level in 1 month.  M ----- Message ----- From: Buel Ream, Lab In [place] Sent: 06/16/2018   3:14 PM EDT To: Lequita Asal, MD

## 2018-06-17 NOTE — Telephone Encounter (Signed)
Patient returned call. Informed of low B12 levels and per Dr. Mike Gip, patient needs to start taking Vitamin B12 1,000 MCG daily. Pt verbalizes understanding and denies any questions.

## 2018-06-17 NOTE — Telephone Encounter (Signed)
-----   Message from Lequita Asal, MD sent at 06/17/2018 12:09 PM EDT ----- Regarding: Please call patient- Dr Juliane Poot is low.  He has B12 deficiency.  Start oral B12 1000 micrograms a day.    We will recheck level in 1 month.  M ----- Message ----- From: Buel Ream, Lab In [place] Sent: 06/16/2018   3:14 PM EDT To: Lequita Asal, MD

## 2018-06-17 NOTE — Telephone Encounter (Signed)
Attempted to contact patient regarding low B12 level and initiation of Vit B12 1,000 MCG Daily. VM left requesting callback on home phone and attempted cell.

## 2018-06-20 ENCOUNTER — Other Ambulatory Visit: Payer: Self-pay

## 2018-06-20 ENCOUNTER — Ambulatory Visit: Payer: Medicare Other | Admitting: Physical Therapy

## 2018-06-20 DIAGNOSIS — D472 Monoclonal gammopathy: Secondary | ICD-10-CM | POA: Diagnosis not present

## 2018-06-21 LAB — IFE+PROTEIN ELECTRO, 24-HR UR
% BETA, Urine: 36.2 %
ALPHA 1 URINE: 5.8 %
Albumin, U: 21.5 %
Alpha 2, Urine: 13.6 %
GAMMA GLOBULIN URINE: 22.9 %
Total Protein, Urine-Ur/day: 89 mg/24 hr (ref 30–150)
Total Protein, Urine: 8.9 mg/dL
Total Volume: 1000

## 2018-06-21 NOTE — Therapy (Unsigned)
[place] [facility] [address] [place], [place], [address] Phone: [phone]   Fax:  [phone]  Patient Details  Name: Donald Riley MRN: [id number] Date of Birth: [birth year] Referring Provider:  No ref. provider found  Encounter Date: 06/21/2018    DPT called patient to let patient know the status of the clinic and ensure that we will be reaching out to schedule when we re-open. DPT fielded any questions patient had at this time and offered guidance on current home program as necessary. Patient reported that he has been feeling better, his pain is improved and he's recently been prescribed B12 which he thinks has really helped. Patient had no further questions at this time, and DPT advised that should questions arise patient can reach out to the clinic for guidance via phone or DPT email.  Lieutenant Diego PT, DPT [phone] AM,06/21/18 [place] [facility] [address] [place], [place], [address] Phone: [phone]   Fax:  [phone]

## 2018-06-27 ENCOUNTER — Ambulatory Visit: Payer: Medicare Other | Admitting: Physical Therapy

## 2018-06-30 ENCOUNTER — Other Ambulatory Visit: Payer: Self-pay

## 2018-07-01 ENCOUNTER — Inpatient Hospital Stay: Payer: Medicare Other | Attending: Hematology and Oncology | Admitting: Hematology and Oncology

## 2018-07-01 ENCOUNTER — Encounter: Payer: Self-pay | Admitting: Hematology and Oncology

## 2018-07-01 DIAGNOSIS — D7589 Other specified diseases of blood and blood-forming organs: Secondary | ICD-10-CM | POA: Diagnosis not present

## 2018-07-01 DIAGNOSIS — E538 Deficiency of other specified B group vitamins: Secondary | ICD-10-CM | POA: Insufficient documentation

## 2018-07-01 DIAGNOSIS — R748 Abnormal levels of other serum enzymes: Secondary | ICD-10-CM

## 2018-07-01 NOTE — Progress Notes (Signed)
[facility]  [address] [place],  [address] Phone: [phone]  Fax: [phone]   Telephone Office Visit:  07/01/2018  I connected with Lianne Bushy on 07/01/18 at 3:30 PM EDT by telephone and verified that I was speaking with the correct person using 2 identifiers.  The patient was at home.  I discussed the limitations, risk, security and privacy concerns of performing an evaluation and management service by telephone and  the availability of in person appointments.  I also discussed with the patient that there may be a patient responsible charge related to this service.  The patient expressed understanding and agreed to proceed.    Chief Complaint: Donald Riley is a 85 y.o. male with a monoclonal gammopathy and an elevated alkaline phosphatase who is here for review of work-up and discussion regarding direction of therapy.  HPI:  The patient was last seen in the hematology clinic on 06/16/2018 for initial consultation.  At that time, he felt worn out.  He denied any B symptoms.  He denied any bone pain except for chronic back issues.  He denied any issues with infections.  Exam revealed no adenopathy or hepatosplenomegaly.  He underwent a work-up.  CBC included a hematocrit of 48.3, hemoglobin 15.4, MCV 103.4, platelets 162,000, white count 6500 with an [place] of 4900.  Calcium was 8.6, albumin 4.0, AST 27, ALT 20, and alkaline phosphatase 154 (38 - 126).  SPEP revealed no monoclonal protein.  Immunofixation was unremarkable.  Kappa free light chains were 26.9, lambda free light chains 19.1, and ratio 1.41 (normal).  24-hour a UPEP with free light chains revealed no monoclonal protein.  B12 was 224.  Folate was 8.2.  Patient contacted on 06/17/2018 regarding his low B12 level.  He was instructed to begin B12 1000 mcg po q day.  During the interim, he has felt "pretty good".  He states that he felt better initially after starting oral B12.  He  still notes back pain.  His last MRI was on 09/26/2014.  He is no longer seeing Dr. Sharlet Salina.  He denies any infections.   Past Medical History:  Diagnosis Date  . Allergy to alpha-gal   . Arthritis   . Asthma   . Atrial fibrillation ([place])   . Cancer ([place]) 2002  . GERD (gastroesophageal reflux disease)   . Glaucoma   . Gout   . Heart murmur   . Hypertension   . Neuropathy   . Osteoporosis   . Sleep apnea     Past Surgical History:  Procedure Laterality Date  . ARTHROPLASTY Right    ankle  . BUNIONECTOMY    . CARDIAC CATHETERIZATION N/A 07/02/2015   Procedure: Right/Left Heart Cath and Coronary Angiography;  Surgeon: Teodoro Spray, MD;  Location: [place] [facility];  Service: Cardiovascular;  Laterality: N/A;  . CORONARY/GRAFT ACUTE MI REVASCULARIZATION N/A 10/02/2016   Procedure: Coronary/Graft Acute MI Revascularization;  Surgeon: Wellington Hampshire, MD;  Location: [place] [facility];  Service: Cardiovascular;  Laterality: N/A;  . JOINT REPLACEMENT    . ostatectomy      Family History  Problem Relation Age of Onset  . Stroke Mother   . Atrial fibrillation Mother   . Stroke Father   . Atrial fibrillation Father     Social History:  reports that he has never smoked. He has never used smokeless tobacco. He reports current alcohol use. He reports that he does not use drugs.  He lives in [place] with his wife, [name] (married 1962).  He has a son, daughter, and 5 grandchildren.  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  He worked in [employer] until 2002.    Participants in the patient's visit included the patient,his wife, and Vito Berger, [place], today.  The intake visit was provided by Vito Berger, CMA.   Allergies:  Allergies  Allergen Reactions  . Galactose Swelling and Other (See Comments)  . Gabapentin Other (See Comments)  . Celecoxib Other (See Comments)    Other reaction(s): OTHER Patient experienced increased blood pressure.    Current  Medications: Current Outpatient Medications  Medication Sig Dispense Refill  . albuterol (VENTOLIN HFA) 108 (90 BASE) MCG/ACT inhaler Inhale into the lungs every 4 (four) hours as needed.     Marland Kitchen amiodarone (PACERONE) 200 MG tablet 400 mg daily.     Marland Kitchen aspirin 81 MG chewable tablet Chew 1 tablet (81 mg total) by mouth daily. 30 tablet 2  . brimonidine (ALPHAGAN P) 0.1 % SOLN Alphagan P 0.1 % eye drops    . clopidogrel (PLAVIX) 75 MG tablet Take 1 tablet (75 mg total) by mouth daily with breakfast. 30 tablet 2  . dorzolamide-timolol (COSOPT) 22.3-6.8 MG/ML ophthalmic solution     . EPINEPHrine 0.3 mg/0.3 mL IJ SOAJ injection Inject 0.3 mLs (0.3 mg total) into the muscle once as needed. 6 Device 0  . furosemide (LASIX) 20 MG tablet Take 1 tablet (20 mg total) by mouth daily. 30 tablet 2  . latanoprost (XALATAN) 0.005 % ophthalmic solution Place 1 drop into both eyes at bedtime.     . metoprolol succinate (TOPROL-XL) 25 MG 24 hr tablet metoprolol succinate ER 25 mg tablet,extended release 24 hr  Take 1 tablet every day by oral route.    . mometasone-formoterol (DULERA) 200-5 MCG/ACT AERO Dulera 200 mcg-5 mcg/actuation HFA aerosol inhaler    . MULTIPLE VITAMIN PO Take by mouth.    . pantoprazole (PROTONIX) 40 MG tablet Take 40 mg by mouth daily. Reported on 07/02/2015    . pramipexole (MIRAPEX) 0.25 MG tablet Take 0.25-0.5 mg by mouth 2 (two) times daily.     Marland Kitchen tiotropium (SPIRIVA HANDIHALER) 18 MCG inhalation capsule Place 18 mcg into inhaler and inhale daily.     . traMADol (ULTRAM) 50 MG tablet Take 1 tablet (50 mg total) by mouth every 8 (eight) hours as needed. 15 tablet 0  . atorvastatin (LIPITOR) 40 MG tablet Take 1 tablet (40 mg total) by mouth daily at 6 PM. (Patient not taking: Reported on 01/20/2018) 30 tablet 2  . baclofen (LIORESAL) 10 MG tablet Take 0.5-1 tablets (5-10 mg total) by mouth 3 (three) times daily as needed for muscle spasms. (Patient not taking: Reported on 06/16/2018) 30 each 0   . predniSONE (DELTASONE) 20 MG tablet Take 1 tablet (20 mg total) by mouth daily. (Patient not taking: Reported on 06/16/2018) 5 tablet 0   No current facility-administered medications for this visit.     Review of Systems:  GENERAL:  Feels "pretty good".  No fevers, sweats or weight loss. PERFORMANCE STATUS (ECOG):  1-2 HEENT:  Glaucoma.  Visual field deficit right eye.  Runny nose.  No sore throat, mouth sores or tenderness. Lungs: No shortness of breath.  Cough in AM.  No hemoptysis. Cardiac:  No chest pain, palpitations, orthopnea, or PND. GI:  Reflux.  No nausea, vomiting, diarrhea, constipation, melena or hematochezia. GU:  No urgency, frequency, dysuria, or hematuria. Musculoskeletal:  Chronic back back pain.  No joint pain.  No muscle tenderness. Extremities:  No pain or swelling. Skin:  Bruising on aspirin and Plavix.  No rashes or skin changes. Neuro:  Neuropathy in legs.  No headache, numbness or weakness, balance or coordination issues. Endocrine:  No diabetes, thyroid issues, hot flashes or night sweats. Psych:  No mood changes, depression or anxiety. Pain:  No focal pain. Review of systems:  All other systems reviewed and found to be negative.    No visits with results within 3 Day(s) from this visit.  Latest known visit with results is:  Orders Only on 06/20/2018  Component Date Value Ref Range Status  . Total Protein, Urine 06/20/2018 8.9  Not Estab. mg/dL Final  . Total Protein, Urine-Ur/day 06/20/2018 89  30 - 150 mg/24 hr Final  . Albumin, U 06/20/2018 21.5  % Final  . ALPHA 1 URINE 06/20/2018 5.8  % Final  . Alpha 2, Urine 06/20/2018 13.6  % Final  . % BETA, Urine 06/20/2018 36.2  % Final  . GAMMA GLOBULIN URINE 06/20/2018 22.9  % Final  . M-SPIKE %, Urine 06/20/2018 Not Observed  Not Observed % Final  . Immunofixation Result, Urine 06/20/2018 Comment   Final   Comment: (NOTE) The immunofixation pattern appears unremarkable. Evidence of monoclonal protein is  not apparent.   . Note: 06/20/2018 Comment   Final   Comment: (NOTE) Protein electrophoresis scan will follow via computer, mail, or courier delivery. Performed At: [facility] [place], [place] [id number] Rush Farmer MD [id number]   . Total Volume 06/20/2018 1,000   Final   Performed at [facility], [address], [place], [place] [address]    Assessment:  CRIS TALAVERA is a 85 y.o. male with an IgG lambda monoclonal gammopathy and elevated alkaline phosphatase.  SPEP has been followed: region of restricted electrophoretic mobility in IgG lambda of unknown significance on 11/24/2016 and a faint IgG lambda monoclonal gammopathy on 05/20/2018.  Work-up on 06/16/2018 revealed a hematocrit of 48.3, hemoglobin 15.4, MCV 103.4, platelets 162,000, white count 6500 with an [place] of 4900.  Normal studies included:  SPEP with immunofixation, free light chains and 24 hour UPEP.  B12 was 224.  Folate was 8.2.  Alkaline phosphatase was 156 (40-129) on 06/08/2018.  Isoenzymes revealed an unusual electrophoretic pattern which may be due to the presence of a possible macro-alkaline phosphatase complex. The clinical significance is uncertain. Alkaline phosphatase was 125 on 05/23/2018, 86 on 05/27/2017, and 154 on 06/16/2018.  He has macrocytosis likely secondary to B12 deficiency.  B12 was 224 on 06/16/2018.  He began oral B12 on 06/17/2018.  He has a history of localized prostate cancer s/p prostatectomy in 2002.  Gleason was 6.  PSA was < 0.01 on 05/23/2018.  Symptomatically, he feels "pretty good".  He has chronic back pain.  Plan: 1.   Review work-up on 06/21/2018. 2.   Monoclonal gammopathy, resolved.  No evidence.  Repeat testing revealed no monoclonal protein.  Immunofixation was normal.  24 hour UPEP with free light chains was normal. 3.   Macrocytic RBC indices  Etiology felt secondary to B12 deficiency.  B12 was 224 on 06/16/2018.  Oral B12  started 06/17/2018.  Check B12 level in 1 month.  R/o pernicious anemia (intrinsic antibodies, anti-parietal antibodies) 4.   Elevated alkaline phosphatase  Alkaline phosphatase remains elevated.  Discuss plan to repeat fractionation at next lab draw. 5.   Prostate cancer  Patient s/p prostatectomy in 2002.  PSA is < 0.01. 6.   RTC in 1 month for MD assessment, labs (CBC with diff, B12, intrinsic antibodies, anti-parietal antibodies, fractionated alkaline phosphatase- 1 week prior), and +/- B12.   I discussed the assessment and treatment plan with the patient.  The patient was provided an opportunity to ask questions and all were answered.  The patient agreed with the plan and demonstrated an understanding of the instructions.  The patient was advised to call back or seek an in person evaluation if the symptoms worsen or if the condition fails to improve as anticipated.  I provided 10 minutes (3:30 PM - 3:50 PM) of non-face-to-face time during this encounter.  I provided these services from the [facility] office.   Lequita Asal, MD, PhD  07/01/2018, 3:41 PM

## 2018-07-01 NOTE — Progress Notes (Signed)
No new changes noted today. The patient DOB , Name and address has been verified with tele visit today

## 2018-07-04 ENCOUNTER — Ambulatory Visit: Payer: Medicare Other | Admitting: Physical Therapy

## 2018-07-15 NOTE — Therapy (Signed)
[place] [facility]IN [facility] SERVICES [address] [place], [place], [address] Phone: [phone]   Fax:  [phone]  Patient Details  Name: Donald Riley MRN: [id number] Date of Birth: 12-Sep-1933 Referring Provider:  No ref. provider found  Encounter Date: 07/15/2018   The [facility] outpatient clinics are closed at this time due to COVID-19. The patient was contacted in regards to therapy services. Pt not available so spoke with wife. Educated wife about telehealth services. Currently Medicare is not covering. Wife will speak to patient and have him call back to clinic to notify if he is interested in telehealth services.    Phillips Grout PT, DPT, GCS  [place] 07/15/2018, 9:42 AM  [place] [facility] SERVICES [address] [place], [place], [address] Phone: [phone]   Fax:  [phone]

## 2018-11-07 ENCOUNTER — Encounter: Payer: Self-pay | Admitting: Hematology and Oncology

## 2018-11-21 ENCOUNTER — Other Ambulatory Visit: Payer: Self-pay

## 2018-11-21 ENCOUNTER — Other Ambulatory Visit: Payer: Self-pay | Admitting: Hematology and Oncology

## 2018-11-21 ENCOUNTER — Inpatient Hospital Stay: Payer: Medicare Other | Attending: Hematology and Oncology

## 2018-11-21 DIAGNOSIS — E538 Deficiency of other specified B group vitamins: Secondary | ICD-10-CM

## 2018-11-21 DIAGNOSIS — R748 Abnormal levels of other serum enzymes: Secondary | ICD-10-CM

## 2018-11-21 DIAGNOSIS — D7589 Other specified diseases of blood and blood-forming organs: Secondary | ICD-10-CM | POA: Insufficient documentation

## 2018-11-21 LAB — CBC WITH DIFFERENTIAL/PLATELET
Abs Immature Granulocytes: 0.03 10*3/uL (ref 0.00–0.07)
Basophils Absolute: 0.1 10*3/uL (ref 0.0–0.1)
Basophils Relative: 1 %
Eosinophils Absolute: 0.1 10*3/uL (ref 0.0–0.5)
Eosinophils Relative: 2 %
HCT: 47.1 % (ref 39.0–52.0)
Hemoglobin: 15.2 g/dL (ref 13.0–17.0)
Immature Granulocytes: 1 %
Lymphocytes Relative: 16 %
Lymphs Abs: 1 10*3/uL (ref 0.7–4.0)
MCH: 33.9 pg (ref 26.0–34.0)
MCHC: 32.3 g/dL (ref 30.0–36.0)
MCV: 105.1 fL — ABNORMAL HIGH (ref 80.0–100.0)
Monocytes Absolute: 0.7 10*3/uL (ref 0.1–1.0)
Monocytes Relative: 10 %
Neutro Abs: 4.6 10*3/uL (ref 1.7–7.7)
Neutrophils Relative %: 70 %
Platelets: 157 10*3/uL (ref 150–400)
RBC: 4.48 MIL/uL (ref 4.22–5.81)
RDW: 14.8 % (ref 11.5–15.5)
WBC: 6.5 10*3/uL (ref 4.0–10.5)
nRBC: 0 % (ref 0.0–0.2)

## 2018-11-21 LAB — VITAMIN B12: Vitamin B-12: 862 pg/mL (ref 180–914)

## 2018-11-22 LAB — ANTI-PARIETAL ANTIBODY: Parietal Cell Antibody-IgG: 1.3 Units (ref 0.0–20.0)

## 2018-11-22 LAB — INTRINSIC FACTOR ANTIBODIES: Intrinsic Factor: 1 AU/mL (ref 0.0–1.1)

## 2018-11-23 LAB — ALKALINE PHOSPHATASE, ISOENZYMES
Alk Phos Bone Fract: 10 % — ABNORMAL LOW (ref 12–68)
Alk Phos Liver Fract: 4 % — ABNORMAL LOW (ref 13–88)
Alk Phos: 141 IU/L — ABNORMAL HIGH (ref 39–117)
Intestinal %: 86 % — ABNORMAL HIGH (ref 0–18)

## 2018-11-27 NOTE — Progress Notes (Signed)
Confirmed Name, DOB, and Address. COVID Screening complete. Reminded of mask and no visitor policy. Patient denies any concerns at this time.

## 2018-11-27 NOTE — Progress Notes (Signed)
[facility]  [address] [place], [place] [address] Phone: [phone]  Fax: [phone]   Clinic Day:  11/28/2018  Referring physician: Hortencia Pilar, MD  Chief Complaint: Donald Riley is a 85 y.o. male with a monoclonal gammopathy and an elevated alkaline phosphatase who seen for 5 month assessment.   HPI: The patient was last seen in the hematology clinic on 07/01/2018 via telephone. At that time, he felt "pretty good".  He had chronic back pain.  Labs on 11/21/2018: hematocrit 47.1, hemoglobin 15.2, MCV 105.1, platelets 157,000.  B12 was 862. Intrinsic factor antibodies 1.0. Anti-parietal antibody 1.3. Alkaline phosphatase 141.   During the interim, he describes his energy level as "no better".  He states he could walk better last year with his neuropathy.  He is more short of breath with exertion.  TSH was 1.94 on 11/21/2018.  He is taking oral B12.   Past Medical History:  Diagnosis Date   Allergy to alpha-gal    Arthritis    Asthma    Atrial fibrillation ([place])    Cancer ([place]) 2002   GERD (gastroesophageal reflux disease)    Glaucoma    Gout    Heart murmur    Hypertension    Neuropathy    Osteoporosis    Sleep apnea     Past Surgical History:  Procedure Laterality Date   ARTHROPLASTY Right    ankle   BUNIONECTOMY     CARDIAC CATHETERIZATION N/A 07/02/2015   Procedure: Right/Left Heart Cath and Coronary Angiography;  Surgeon: Teodoro Spray, MD;  Location: [facility];  Service: Cardiovascular;  Laterality: N/A;   CORONARY/GRAFT ACUTE MI REVASCULARIZATION N/A 10/02/2016   Procedure: Coronary/Graft Acute MI Revascularization;  Surgeon: Wellington Hampshire, MD;  Location: [facility];  Service: Cardiovascular;  Laterality: N/A;   JOINT REPLACEMENT     ostatectomy      Family History  Problem Relation Age of Onset   Stroke Mother    Atrial fibrillation Mother    Stroke Father     Atrial fibrillation Father     Social History:  reports that he has never smoked. He has never used smokeless tobacco. He reports current alcohol use. He reports that he does not use drugs. He lives in [place] with his wife, Donald Riley (married 1962).  He has a son, daughter, and 5 grandchildren.  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  He worked in [employer] until 2002.  He is alone today.   Allergies:  Allergies  Allergen Reactions   Galactose Swelling and Other (See Comments)   Gabapentin Other (See Comments)   Celecoxib Other (See Comments)    Other reaction(s): OTHER Patient experienced increased blood pressure.    Current Medications: Current Outpatient Medications  Medication Sig Dispense Refill   albuterol (VENTOLIN HFA) 108 (90 BASE) MCG/ACT inhaler Inhale into the lungs every 4 (four) hours as needed.      amiodarone (PACERONE) 200 MG tablet 400 mg daily.      aspirin 81 MG chewable tablet Chew 1 tablet (81 mg total) by mouth daily. 30 tablet 2   atorvastatin (LIPITOR) 40 MG tablet Take 1 tablet (40 mg total) by mouth daily at 6 PM. 30 tablet 2   brimonidine (ALPHAGAN P) 0.1 % SOLN Alphagan P 0.1 % eye drops     clopidogrel (PLAVIX) 75 MG tablet Take 1 tablet (75 mg total) by mouth daily with breakfast. 30 tablet 2  dorzolamide-timolol (COSOPT) 22.3-6.8 MG/ML ophthalmic solution      DULoxetine (CYMBALTA) 60 MG capsule Take 60 mg by mouth daily.      EPINEPHrine 0.3 mg/0.3 mL IJ SOAJ injection Inject 0.3 mLs (0.3 mg total) into the muscle once as needed. 6 Device 0   furosemide (LASIX) 20 MG tablet Take 1 tablet (20 mg total) by mouth daily. 30 tablet 2   latanoprost (XALATAN) 0.005 % ophthalmic solution Place 1 drop into both eyes at bedtime.      metoprolol succinate (TOPROL-XL) 25 MG 24 hr tablet metoprolol succinate ER 25 mg tablet,extended release 24 hr  Take 1 tablet every day by oral route.     MULTIPLE VITAMIN PO Take 1 tablet by mouth daily.       pantoprazole (PROTONIX) 40 MG tablet Take 40 mg by mouth daily. Reported on 07/02/2015     pramipexole (MIRAPEX) 0.25 MG tablet Take 0.25-0.5 mg by mouth 2 (two) times daily.      tiotropium (SPIRIVA HANDIHALER) 18 MCG inhalation capsule Place 18 mcg into inhaler and inhale daily.      traMADol (ULTRAM) 50 MG tablet Take 1 tablet (50 mg total) by mouth every 8 (eight) hours as needed. 15 tablet 0   No current facility-administered medications for this visit.     Review of Systems  Constitutional: Negative.  Negative for chills, diaphoresis, fever, malaise/fatigue and weight loss (stable).       Energy level is "no better".  HENT: Negative.  Negative for congestion, hearing loss, nosebleeds, sinus pain and sore throat.   Eyes: Positive for blurred vision (right eye deficit). Negative for pain.       Glaucoma.  Respiratory: Negative.  Negative for cough, sputum production, shortness of breath (with exertion) and wheezing.   Cardiovascular: Negative.  Negative for chest pain, palpitations, orthopnea, leg swelling and PND.  Gastrointestinal: Positive for heartburn (reflux). Negative for abdominal pain, blood in stool, constipation, diarrhea, melena, nausea and vomiting.  Genitourinary: Negative.  Negative for dysuria, frequency, hematuria and urgency.  Musculoskeletal: Positive for back pain (chronic). Negative for joint pain and myalgias.  Skin: Negative.  Negative for rash.  Neurological: Positive for sensory change (chronic neuropathy in legs). Negative for dizziness, tingling, focal weakness, weakness and headaches.  Endo/Heme/Allergies: Bruises/bleeds easily (on Plavix and aspirin).  Psychiatric/Behavioral: Negative.  Negative for depression and memory loss. The patient is not nervous/anxious and does not have insomnia.   All other systems reviewed and are negative.  Performance status (ECOG): 2  Vitals Blood pressure 120/77, pulse 65, temperature (!) 97.5 F (36.4 C),  temperature source Tympanic, resp. rate 18, height '5\' 8"'  (1.727 m), weight 183 lb 5 oz (83.2 kg), SpO2 97 %.   Physical Exam  Constitutional: He is oriented to person, place, and time. He appears well-developed and well-nourished. No distress.  HENT:  Head: Normocephalic and atraumatic.  Mouth/Throat: Oropharynx is clear and moist. No oropharyngeal exudate.  White hair.  Mask.  Eyes: Pupils are equal, round, and reactive to light. Conjunctivae and EOM are normal. No scleral icterus.  Blue eyes.  Neck: Normal range of motion. Neck supple. No JVD present.  Cardiovascular: Normal rate, regular rhythm and normal heart sounds.  No murmur heard. Pulmonary/Chest: Effort normal and breath sounds normal. No respiratory distress. He has no wheezes. He has no rales.  Abdominal: Soft. Bowel sounds are normal. He exhibits no distension and no mass. There is no abdominal tenderness. There is no rebound and no guarding.  Musculoskeletal: Normal range of motion.        General: No edema.  Lymphadenopathy:    He has no cervical adenopathy.    He has no axillary adenopathy.       Right: No supraclavicular adenopathy present.       Left: No supraclavicular adenopathy present.  Neurological: He is alert and oriented to person, place, and time.  Slow metered gait.  Skin: Skin is warm and dry. No rash noted. He is not diaphoretic. No erythema. No pallor.  Psychiatric: He has a normal mood and affect. His behavior is normal. Judgment and thought content normal.  Nursing note and vitals reviewed.   No visits with results within 3 Day(s) from this visit.  Latest known visit with results is:  Appointment on 11/21/2018  Component Date Value Ref Range Status   Alk Phos 11/21/2018 141* 39 - 117 IU/L Final   Alk Phos Bone Fract 11/21/2018 10* 12 - 68 % Final   Alk Phos Liver Fract 11/21/2018 4* 13 - 88 % Final   Intestinal % 11/21/2018 86* 0 - 18 % Final   Comment: (NOTE) Intestinal alkaline phosphatase  is seen normally in the serum of subjects who have B or O blood types, especially after a fatty meal.  Pathologically the band may be present in perforation of the bowel, ulcerative disease of the intestine and faintly in liver cirrhosis.  Acute infarction of the intestine will cause a release of intestinal ALP from the mucosa.  Large erosive or ulcerative lesions of the stomach, duodenum or other small intestinal areas, or colon may result in an elevation of the serum ALP level. The small intestinal lesions associated with malabsorption are associated with an elevation of the serum intestinal ALP level only if there is an erosive or ulcerative mucosal lesion. Performed At: [facility] [place], [place] [id number] Rush Farmer MD [id number]    Parietal Cell Antibody-IgG 11/21/2018 1.3  0.0 - 20.0 Units Final   Comment: (NOTE)                                Negative    0.0 - 20.0                                Equivocal  20.1 - 24.9                                Positive         >24.9 Parietal Cell Antibodies are found in 90% of patients with pernicious anemia and 30% of first degree relatives with pernicious anemia. Performed At: [facility] [place], [place] [id number] Rush Farmer MD [id number]    Intrinsic Factor 11/21/2018 1.0  0.0 - 1.1 AU/mL Final   Comment: (NOTE) Performed At: [facility] [place], [place] [id number] Rush Farmer MD [id number]    Vitamin B-12 11/21/2018 862  180 - 914 pg/mL Final   Comment: (NOTE) This assay is not validated for testing neonatal or myeloproliferative syndrome specimens for Vitamin B12 levels. Performed at [facility], [place] [address], [place], [place] [address]    WBC 11/21/2018 6.5  4.0 - 10.5 K/uL Final   RBC 11/21/2018 4.48  4.22 - 5.81 MIL/uL Final   Hemoglobin  11/21/2018 15.2  13.0 - 17.0 g/dL Final   HCT 11/21/2018 47.1   39.0 - 52.0 % Final   MCV 11/21/2018 105.1* 80.0 - 100.0 fL Final   MCH 11/21/2018 33.9  26.0 - 34.0 pg Final   MCHC 11/21/2018 32.3  30.0 - 36.0 g/dL Final   RDW 11/21/2018 14.8  11.5 - 15.5 % Final   Platelets 11/21/2018 157  150 - 400 K/uL Final   nRBC 11/21/2018 0.0  0.0 - 0.2 % Final   Neutrophils Relative % 11/21/2018 70  % Final   Neutro Abs 11/21/2018 4.6  1.7 - 7.7 K/uL Final   Lymphocytes Relative 11/21/2018 16  % Final   Lymphs Abs 11/21/2018 1.0  0.7 - 4.0 K/uL Final   Monocytes Relative 11/21/2018 10  % Final   Monocytes Absolute 11/21/2018 0.7  0.1 - 1.0 K/uL Final   Eosinophils Relative 11/21/2018 2  % Final   Eosinophils Absolute 11/21/2018 0.1  0.0 - 0.5 K/uL Final   Basophils Relative 11/21/2018 1  % Final   Basophils Absolute 11/21/2018 0.1  0.0 - 0.1 K/uL Final   Immature Granulocytes 11/21/2018 1  % Final   Abs Immature Granulocytes 11/21/2018 0.03  0.00 - 0.07 K/uL Final   Performed at [facility], [address], [place], [place] [address]    Assessment:  HARPER SMOKER is a 85 y.o. male with an IgG lambda monoclonal gammopathy and elevated alkaline phosphatase.  SPEP has been followed: region of restricted electrophoretic mobility in IgG lambda of unknown significance on 11/24/2016 and a faint IgG lambda monoclonal gammopathy on 05/20/2018.  Work-up on 06/16/2018 revealed a hematocrit of 48.3, hemoglobin 15.4, MCV 103.4, platelets 162,000, white count 6500 with an [place] of 4900.  Normal studies included:  SPEP with immunofixation, free light chains and 24 hour UPEP.  B12 was 224.  Folate was 8.2.  Alkaline phosphatase was 156 (40-129) on 06/08/2018.  Isoenzymes revealed an unusual electrophoretic pattern which may be due to the presence of a possible macro-alkaline phosphatase complex. The clinical significance is uncertain. Alkaline phosphatase was 125 on 05/23/2018, 86 on 05/27/2017, 154 on 06/16/2018, and 141 on  11/21/2018.  He has macrocytosis likely secondary to B12 deficiency.  B12 was 224 on 06/16/2018.  He began oral B12 on 06/17/2018.  B12 was 862 on 11/21/2018.  Intrinsic factor antibodies and anti-parietal antibody were negative on 11/21/2018.  He has a history of localized prostate cancer s/p prostatectomy in 2002.  Gleason was 6.  PSA was < 0.01 on 05/23/2018.  Symptomatically, energy level remains low.  Exam is stable.  Hematocrit is 47.1 and hemoglobin 15.2.  Plan: 1.   Review labs from 11/21/2018. 2.   Monoclonal gammopathy, resolved.             No current evidence.             Repeat testing on 06/16/2018 revealed no monoclonal protein.             24-hour UPEP and free light chains were normal. 3.   Macrocytic RBC indices             MCV 105.1.  Etiology possibly related to B12 deficiency (should resolve with time after correction).   Differential diagnosis also includes a myelodysplastic syndrome (MDS).   Continue to monitor. 4.   B12 deficiency             B12 was 224 on 06/16/2018 and 862 on 11/21/2018.  Oral B12 started 06/17/2018.             No evidence of pernicious anemia.  Check folate periodically. 5.   Elevated alkaline phosphatase             Alkaline phosphatase was 154 (38-126) on 06/16/2018 and 141 on 11/21/2018.             Fractionation reveals predominant intestinal fraction.  Patient denies any GI symptoms. 6.   Prostate cancer             Patient s/p prostatectomy in 2002.             PSA is < 0.01 on 05/23/2018. 7.   RTC prn.  I discussed the assessment and treatment plan with the patient.  The patient was provided an opportunity to ask questions and all were answered.  The patient agreed with the plan and demonstrated an understanding of the instructions.  The patient was advised to call back if the symptoms worsen or if the condition fails to improve as anticipated.   Lequita Asal, MD, PhD    11/28/2018, 3:07 PM

## 2018-11-28 ENCOUNTER — Other Ambulatory Visit: Payer: Self-pay

## 2018-11-28 ENCOUNTER — Inpatient Hospital Stay (HOSPITAL_BASED_OUTPATIENT_CLINIC_OR_DEPARTMENT_OTHER): Payer: Medicare Other | Admitting: Hematology and Oncology

## 2018-11-28 ENCOUNTER — Encounter: Payer: Self-pay | Admitting: Hematology and Oncology

## 2018-11-28 VITALS — BP 120/77 | HR 65 | Temp 97.5°F | Resp 18 | Ht 68.0 in | Wt 183.3 lb

## 2018-11-28 DIAGNOSIS — E538 Deficiency of other specified B group vitamins: Secondary | ICD-10-CM

## 2018-11-28 DIAGNOSIS — D7589 Other specified diseases of blood and blood-forming organs: Secondary | ICD-10-CM

## 2018-11-28 DIAGNOSIS — R748 Abnormal levels of other serum enzymes: Secondary | ICD-10-CM

## 2018-11-28 NOTE — Progress Notes (Signed)
No new changes noted today 

## 2019-02-24 ENCOUNTER — Emergency Department: Payer: Medicare Other

## 2019-02-24 ENCOUNTER — Encounter: Payer: Self-pay | Admitting: Emergency Medicine

## 2019-02-24 ENCOUNTER — Other Ambulatory Visit: Payer: Self-pay

## 2019-02-24 ENCOUNTER — Inpatient Hospital Stay
Admission: EM | Admit: 2019-02-24 | Discharge: 2019-02-26 | DRG: 542 | Disposition: A | Discharge status: Home Health | Payer: Medicare Other | Attending: Family Medicine | Admitting: Family Medicine

## 2019-02-24 DIAGNOSIS — S3282XA Multiple fractures of pelvis without disruption of pelvic ring, initial encounter for closed fracture: Secondary | ICD-10-CM | POA: Diagnosis not present

## 2019-02-24 DIAGNOSIS — Z20828 Contact with and (suspected) exposure to other viral communicable diseases: Secondary | ICD-10-CM | POA: Diagnosis present

## 2019-02-24 DIAGNOSIS — I2583 Coronary atherosclerosis due to lipid rich plaque: Secondary | ICD-10-CM | POA: Diagnosis not present

## 2019-02-24 DIAGNOSIS — I350 Nonrheumatic aortic (valve) stenosis: Secondary | ICD-10-CM | POA: Diagnosis present

## 2019-02-24 DIAGNOSIS — Z9861 Coronary angioplasty status: Secondary | ICD-10-CM

## 2019-02-24 DIAGNOSIS — K5909 Other constipation: Secondary | ICD-10-CM | POA: Diagnosis present

## 2019-02-24 DIAGNOSIS — M1990 Unspecified osteoarthritis, unspecified site: Secondary | ICD-10-CM | POA: Diagnosis present

## 2019-02-24 DIAGNOSIS — I5023 Acute on chronic systolic (congestive) heart failure: Secondary | ICD-10-CM | POA: Diagnosis present

## 2019-02-24 DIAGNOSIS — J9601 Acute respiratory failure with hypoxia: Secondary | ICD-10-CM | POA: Diagnosis present

## 2019-02-24 DIAGNOSIS — Z7902 Long term (current) use of antithrombotics/antiplatelets: Secondary | ICD-10-CM | POA: Diagnosis not present

## 2019-02-24 DIAGNOSIS — M5136 Other intervertebral disc degeneration, lumbar region: Secondary | ICD-10-CM | POA: Diagnosis present

## 2019-02-24 DIAGNOSIS — I482 Chronic atrial fibrillation, unspecified: Secondary | ICD-10-CM | POA: Diagnosis present

## 2019-02-24 DIAGNOSIS — I110 Hypertensive heart disease with heart failure: Secondary | ICD-10-CM | POA: Diagnosis present

## 2019-02-24 DIAGNOSIS — Z888 Allergy status to other drugs, medicaments and biological substances status: Secondary | ICD-10-CM

## 2019-02-24 DIAGNOSIS — Z79891 Long term (current) use of opiate analgesic: Secondary | ICD-10-CM

## 2019-02-24 DIAGNOSIS — Z8546 Personal history of malignant neoplasm of prostate: Secondary | ICD-10-CM

## 2019-02-24 DIAGNOSIS — M8448XA Pathological fracture, other site, initial encounter for fracture: Secondary | ICD-10-CM

## 2019-02-24 DIAGNOSIS — J45909 Unspecified asthma, uncomplicated: Secondary | ICD-10-CM | POA: Diagnosis present

## 2019-02-24 DIAGNOSIS — M8088XA Other osteoporosis with current pathological fracture, vertebra(e), initial encounter for fracture: Secondary | ICD-10-CM | POA: Diagnosis present

## 2019-02-24 DIAGNOSIS — Z7982 Long term (current) use of aspirin: Secondary | ICD-10-CM | POA: Diagnosis not present

## 2019-02-24 DIAGNOSIS — W19XXXA Unspecified fall, initial encounter: Secondary | ICD-10-CM | POA: Diagnosis present

## 2019-02-24 DIAGNOSIS — K219 Gastro-esophageal reflux disease without esophagitis: Secondary | ICD-10-CM | POA: Diagnosis present

## 2019-02-24 DIAGNOSIS — G4733 Obstructive sleep apnea (adult) (pediatric): Secondary | ICD-10-CM | POA: Diagnosis present

## 2019-02-24 DIAGNOSIS — M808AXA Other osteoporosis with current pathological fracture, other site, initial encounter for fracture: Principal | ICD-10-CM | POA: Diagnosis present

## 2019-02-24 DIAGNOSIS — M533 Sacrococcygeal disorders, not elsewhere classified: Secondary | ICD-10-CM | POA: Diagnosis not present

## 2019-02-24 DIAGNOSIS — I2510 Atherosclerotic heart disease of native coronary artery without angina pectoris: Secondary | ICD-10-CM | POA: Diagnosis present

## 2019-02-24 DIAGNOSIS — Z79899 Other long term (current) drug therapy: Secondary | ICD-10-CM

## 2019-02-24 DIAGNOSIS — Z823 Family history of stroke: Secondary | ICD-10-CM | POA: Diagnosis not present

## 2019-02-24 DIAGNOSIS — M109 Gout, unspecified: Secondary | ICD-10-CM | POA: Diagnosis present

## 2019-02-24 DIAGNOSIS — S329XXA Fracture of unspecified parts of lumbosacral spine and pelvis, initial encounter for closed fracture: Secondary | ICD-10-CM | POA: Diagnosis present

## 2019-02-24 DIAGNOSIS — I10 Essential (primary) hypertension: Secondary | ICD-10-CM

## 2019-02-24 DIAGNOSIS — H409 Unspecified glaucoma: Secondary | ICD-10-CM | POA: Diagnosis present

## 2019-02-24 HISTORY — DX: Heart failure, unspecified: I50.9

## 2019-02-24 HISTORY — DX: Nonrheumatic aortic (valve) stenosis: I35.0

## 2019-02-24 LAB — CBC
HCT: 45.6 % (ref 39.0–52.0)
Hemoglobin: 14.7 g/dL (ref 13.0–17.0)
MCH: 32.7 pg (ref 26.0–34.0)
MCHC: 32.2 g/dL (ref 30.0–36.0)
MCV: 101.3 fL — ABNORMAL HIGH (ref 80.0–100.0)
Platelets: 198 10*3/uL (ref 150–400)
RBC: 4.5 MIL/uL (ref 4.22–5.81)
RDW: 14.1 % (ref 11.5–15.5)
WBC: 7.7 10*3/uL (ref 4.0–10.5)
nRBC: 0 % (ref 0.0–0.2)

## 2019-02-24 LAB — URINALYSIS, COMPLETE (UACMP) WITH MICROSCOPIC
Bacteria, UA: NONE SEEN
Bilirubin Urine: NEGATIVE
Glucose, UA: NEGATIVE mg/dL
Hgb urine dipstick: NEGATIVE
Ketones, ur: NEGATIVE mg/dL
Leukocytes,Ua: NEGATIVE
Nitrite: NEGATIVE
Protein, ur: NEGATIVE mg/dL
Specific Gravity, Urine: 1.009 (ref 1.005–1.030)
Squamous Epithelial / HPF: NONE SEEN (ref 0–5)
WBC, UA: NONE SEEN WBC/hpf (ref 0–5)
pH: 7 (ref 5.0–8.0)

## 2019-02-24 LAB — BASIC METABOLIC PANEL
Anion gap: 6 (ref 5–15)
BUN: 18 mg/dL (ref 8–23)
CO2: 28 mmol/L (ref 22–32)
Calcium: 8.5 mg/dL — ABNORMAL LOW (ref 8.9–10.3)
Chloride: 100 mmol/L (ref 98–111)
Creatinine, Ser: 0.85 mg/dL (ref 0.61–1.24)
GFR calc Af Amer: >60 mL/min (ref >60)
GFR calc non Af Amer: >60 mL/min (ref >60)
Glucose, Bld: 105 mg/dL — ABNORMAL HIGH (ref 70–99)
Potassium: 4.7 mmol/L (ref 3.5–5.1)
Sodium: 134 mmol/L — ABNORMAL LOW (ref 135–145)

## 2019-02-24 MED ORDER — MORPHINE SULFATE (PF) 4 MG/ML IV SOLN
4.0000 mg | Freq: Once | INTRAVENOUS | Status: AC
  Administered 2019-02-24: 21:00:00 4 mg via INTRAVENOUS

## 2019-02-24 MED ORDER — OXYCODONE-ACETAMINOPHEN 5-325 MG PO TABS
1.0000 | ORAL_TABLET | Freq: Once | ORAL | Status: AC
  Administered 2019-02-24: 1 via ORAL
  Filled 2019-02-24: qty 1

## 2019-02-24 MED ORDER — MORPHINE SULFATE (PF) 4 MG/ML IV SOLN
INTRAVENOUS | Status: AC
  Administered 2019-02-24: 4 mg via INTRAVENOUS
  Filled 2019-02-24: qty 1

## 2019-02-24 MED ORDER — SODIUM CHLORIDE 0.9% FLUSH: 3.0000 mL | Freq: Once | INTRAVENOUS | Status: DC

## 2019-02-24 NOTE — ED Provider Notes (Addendum)
[facility] Emergency Department Provider Note ____________________________________________   First MD Initiated Contact with Patient 02/24/19 1814     (approximate)  I have reviewed the triage vital signs and the nursing notes.   HISTORY  Chief Complaint Back Pain    HPI Donald Riley is a 85 y.o. male with PMH as noted below who presents with low back pain, acute onset 6 days ago, persistent since then, and bilateral.  It is associated with weakness and numbness in both lower legs.  The patient states that at that time 6 days ago, he felt like his legs gave out on him which is what caused him to fall.  He has had weakness since then.  The patient states that he has chronic numbness due to peripheral neuropathy but states that it is worse and especially so in the distal part of his legs.  He states that he has trouble holding his urine until he can get to the bathroom, but denies retention.  He has no fecal incontinence.  He was seen at urgent care today and sent to the ER for further evaluation.  Past Medical History:  Diagnosis Date  . Allergy to alpha-gal   . Aortic stenosis   . Arthritis   . Asthma   . Atrial fibrillation ([place])   . Atrial fibrillation ([place])   . Cancer ([place]) 2002  . CHF (congestive heart failure) ([place])   . GERD (gastroesophageal reflux disease)   . Glaucoma   . Gout   . Hypertension   . Neuropathy   . Osteoporosis   . Sleep apnea     Patient Active Problem List   Diagnosis Date Noted  . Pelvis fracture ([place]) 02/24/2019  . Atrial fibrillation, chronic ([place]) 02/24/2019  . Essential hypertension 02/24/2019  . Coronary artery disease due to lipid rich plaque 02/24/2019  . Acute on chronic systolic CHF (congestive heart failure) ([place]) 02/24/2019  . Aortic stenosis 02/24/2019  . Pelvic fracture ([place]) 02/24/2019  . B12 deficiency 07/01/2018  . Elevated alkaline phosphatase level 06/16/2018  . Macrocytosis 06/16/2018  . Acute  ST elevation myocardial infarction (STEMI) of anterolateral wall ([place])   . Angio-edema   . Nonsustained ventricular tachycardia ([place])   . Acute respiratory failure ([place])   . OSA (obstructive sleep apnea)   . Macroglossia   . Non-STEMI (non-ST elevated myocardial infarction) ([place])   . Anaphylaxis 09/30/2016    Past Surgical History:  Procedure Laterality Date  . ARTHROPLASTY Right    ankle  . BUNIONECTOMY    . CARDIAC CATHETERIZATION N/A 07/02/2015   Procedure: Right/Left Heart Cath and Coronary Angiography;  Surgeon: Teodoro Spray, MD;  Location: [facility];  Service: Cardiovascular;  Laterality: N/A;  . CORONARY/GRAFT ACUTE MI REVASCULARIZATION N/A 10/02/2016   Procedure: Coronary/Graft Acute MI Revascularization;  Surgeon: Wellington Hampshire, MD;  Location: [facility];  Service: Cardiovascular;  Laterality: N/A;  . JOINT REPLACEMENT    . ostatectomy      Prior to Admission medications   Medication Sig Start Date End Date Taking? Authorizing Provider  albuterol (VENTOLIN HFA) 108 (90 BASE) MCG/ACT inhaler Inhale into the lungs every 4 (four) hours as needed.  01/30/14  Yes [provider]  amiodarone (PACERONE) 200 MG tablet 400 mg daily.  10/06/16  Yes [provider]  aspirin 81 MG chewable tablet Chew 1 tablet (81 mg total) by mouth daily. 10/06/16  Yes Demetrios Loll, MD  brimonidine (ALPHAGAN P) 0.1 %  SOLN Alphagan P 0.1 % eye drops   Yes [provider]  clopidogrel (PLAVIX) 75 MG tablet Take 1 tablet (75 mg total) by mouth daily with breakfast. 10/07/16  Yes Demetrios Loll, MD  dorzolamide-timolol (COSOPT) 22.3-6.8 MG/ML ophthalmic solution  08/20/16  Yes [provider]  DULoxetine (CYMBALTA) 60 MG capsule Take 60 mg by mouth daily.  10/27/18  Yes [provider]  EPINEPHrine 0.3 mg/0.3 mL IJ SOAJ injection Inject 0.3 mLs (0.3 mg total) into the muscle once as needed. 10/06/16  Yes Demetrios Loll, MD  furosemide (LASIX) 20 MG tablet  Take 1 tablet (20 mg total) by mouth daily. 10/06/16  Yes Demetrios Loll, MD  latanoprost (XALATAN) 0.005 % ophthalmic solution Place 1 drop into both eyes at bedtime.  01/30/13  Yes [provider]  metoprolol succinate (TOPROL-XL) 25 MG 24 hr tablet metoprolol succinate ER 25 mg tablet,extended release 24 hr  Take 1 tablet every day by oral route.   Yes [provider]  MULTIPLE VITAMIN PO Take 1 tablet by mouth daily.    Yes [provider]  pantoprazole (PROTONIX) 40 MG tablet Take 40 mg by mouth daily. Reported on 07/02/2015 05/19/12  Yes [provider]  rosuvastatin (CRESTOR) 5 MG tablet Take 5 mg by mouth daily. 02/24/19  Yes [provider]  acetaminophen (TYLENOL) 325 MG tablet Take 2 tablets (650 mg total) by mouth every 6 (six) hours as needed for mild pain. 02/26/19   Samuella Cota, MD  oxyCODONE (OXY IR/ROXICODONE) 5 MG immediate release tablet Take 1-2 tablets (5-10 mg total) by mouth every 6 (six) hours as needed for severe pain. 02/26/19   Samuella Cota, MD  polyethylene glycol (MIRALAX / GLYCOLAX) 17 g packet Take 17 g by mouth 2 (two) times daily. 02/26/19   Samuella Cota, MD  pramipexole (MIRAPEX) 0.25 MG tablet Take 0.25-0.5 mg by mouth 2 (two) times daily.     [provider]  tiotropium (SPIRIVA HANDIHALER) 18 MCG inhalation capsule Place 18 mcg into inhaler and inhale daily.  01/30/14   [provider]  traMADol (ULTRAM) 50 MG tablet Take 1 tablet (50 mg total) by mouth every 8 (eight) hours as needed for moderate pain. 02/26/19   Samuella Cota, MD    Allergies Galactose, Gabapentin, and Celecoxib  Family History  Problem Relation Age of Onset  . Stroke Mother   . Atrial fibrillation Mother   . Stroke Father   . Atrial fibrillation Father     Social History Social History   Tobacco Use  . Smoking status: Never Smoker  . Smokeless tobacco: Never Used  . Tobacco comment: NA  Substance Use  Topics  . Alcohol use: Yes    Alcohol/week: 7.0 standard drinks    Types: 7 Shots of liquor per week    Comment: rare  . Drug use: No    Review of Systems  Constitutional: No fever. Eyes: No redness. ENT: No sore throat.  No neck pain. Cardiovascular: Denies chest pain. Respiratory: Denies shortness of breath. Gastrointestinal: No diarrhea. Genitourinary: Negative for dysuria.  Musculoskeletal: Positive for back pain. Skin: Negative for rash. Neurological: Positive for numbness.   ____________________________________________   PHYSICAL EXAM:  VITAL SIGNS: ED Triage Vitals  Enc Vitals Group     BP 02/24/19 1412 121/75     Pulse Rate 02/24/19 1412 70     Resp 02/24/19 1412 18     Temp 02/24/19 1412 98.6 F (37 C)  Temp Source 02/24/19 1412 Oral     SpO2 02/24/19 1412 95 %     Weight --      Height --      Head Circumference --      Peak Flow --      Pain Score 02/24/19 1415 7     Pain Loc --      Pain Edu? --      Excl. in [place]? --     Constitutional: Alert and oriented.  Uncomfortable appearing but in no acute distress. Eyes: Conjunctivae are normal.  Head: Atraumatic. Nose: No congestion/rhinnorhea. Mouth/Throat: Mucous membranes are moist.   Neck: Normal range of motion.  Cardiovascular: Good peripheral circulation. Respiratory: Normal respiratory effort.  No retractions.  Gastrointestinal: No distention.  Genitourinary: No CVA tenderness. Musculoskeletal: No lower extremity edema.  Extremities warm and well perfused.  No midline spinal tenderness.  Bilateral lumbosacral mild paraspinal tenderness. Neurologic:  Normal speech and language.  Minimally decreased motor strength bilateral lower extremities.  Subjective decreased sensation to bilateral lower extremities especially distal to the knee.   Skin:  Skin is warm and dry. No rash noted. Psychiatric: Mood and affect are normal. Speech and behavior are normal.   ____________________________________________   LABS (all labs ordered are listed, but only abnormal results are displayed)  Labs Reviewed  BASIC METABOLIC PANEL - Abnormal; Notable for the following components:      Result Value   Sodium 134 (*)    Glucose, Bld 105 (*)    Calcium 8.5 (*)    All other components within normal limits  CBC - Abnormal; Notable for the following components:   MCV 101.3 (*)    All other components within normal limits  URINALYSIS, COMPLETE (UACMP) WITH MICROSCOPIC - Abnormal; Notable for the following components:   Color, Urine YELLOW (*)    APPearance CLEAR (*)    All other components within normal limits  BASIC METABOLIC PANEL - Abnormal; Notable for the following components:   Glucose, Bld 100 (*)    Calcium 8.6 (*)    All other components within normal limits  CBC - Abnormal; Notable for the following components:   MCV 101.6 (*)    All other components within normal limits  SARS CORONAVIRUS 2 (TAT 6-24 HRS)   ____________________________________________  EKG  ED ECG REPORT I, Arta Silence, the attending physician, personally viewed and interpreted this ECG.  Date: 03/20/2019 EKG Time: 1438 Rate: 68 Rhythm: normal sinus rhythm QRS Axis: normal Intervals: normal ST/T Wave abnormalities: Nonspecific anterior abnormalities Narrative Interpretation: Nonspecific abnormalities with no evidence of acute ischemia  ____________________________________________  RADIOLOGY  XR lumbar spine: No acute fracture MR lumbar spine: Sacral insufficiency fracture, right foraminal impingement at L2-3  ____________________________________________   PROCEDURES  Procedure(s) performed: No  Procedures  Critical Care performed: No ____________________________________________   INITIAL IMPRESSION / ASSESSMENT AND PLAN / ED COURSE  Pertinent labs & imaging results that were available during my care of the patient were reviewed by me and  considered in my medical decision making (see chart for details).  85 year old male with PMH as noted above presents with bilateral lower extremity weakness and numbness ever since a fall 6 days ago in which the patient felt like his legs gave out on him.  On exam, the patient is uncomfortable but not acutely ill-appearing.  His vital signs are normal.  He is able to lift both legs although they are slightly weak, however exam appears somewhat limited due to pain.  He reports tingling and some decrease sensation of bilateral lower legs.  He has no midline spinal tenderness.  The symptoms have gradually progressed over the last week from their initial onset.  I have a low suspicion for cauda equina syndrome, however presentation is consistent with disc herniation or other lumbar spine issue.  We will obtain an x-ray, and if it shows no obvious fracture, I will proceed with an MRI to further evaluate.  ----------------------------------------- 10:50 PM on 02/24/2019 -----------------------------------------  MRI reveals sacral insufficiency fracture.  I consulted Dr. Mack Guise from orthopedics and discussed the imaging results with him.  He advised that this fracture typically does not require operative management, and the patient will need pain control and physical therapy evaluation.  Given that the patient has needed IV analgesia, I will admit him for pain control and physical therapy evaluation.  I discussed the case with Dr. Loleta Books from the hospitalist service.  ____________________________________________   FINAL CLINICAL IMPRESSION(S) / ED DIAGNOSES  Final diagnoses:  Sacral insufficiency fracture, initial encounter      NEW MEDICATIONS STARTED DURING THIS VISIT:  Discharge Medication List as of 02/26/2019 11:10 AM    START taking these medications   Details  acetaminophen (TYLENOL) 325 MG tablet Take 2 tablets (650 mg total) by mouth every 6 (six) hours as needed for mild  pain., Starting Sun 02/26/2019, No Print    oxyCODONE (OXY IR/ROXICODONE) 5 MG immediate release tablet Take 1-2 tablets (5-10 mg total) by mouth every 6 (six) hours as needed for severe pain., Starting Sun 02/26/2019, Normal    polyethylene glycol (MIRALAX / GLYCOLAX) 17 g packet Take 17 g by mouth 2 (two) times daily., Starting Sun 02/26/2019, Normal         Note:  This document was prepared using Dragon voice recognition software and may include unintentional dictation errors.    Arta Silence, MD 02/24/19 2252    Arta Silence, MD 03/20/19 1147

## 2019-02-24 NOTE — ED Notes (Signed)
Pt transported to xray 

## 2019-02-24 NOTE — ED Notes (Signed)
Pts wife reporting worsening pain and requesting more medication. NO IV in place but after talking to pt and family IV pain medication was decided upon. IV placed and pain medication administered.

## 2019-02-24 NOTE — ED Triage Notes (Signed)
Pt here with c/o pain in his upper and  lower lumbar area, lower back pain worse than upper due to a fall a week ago after his legs just "gave out." [facility] states he needs an MRI and sent him here. States pain is aggravated by weight bearing and any motion/movement. NAD.

## 2019-02-24 NOTE — ED Notes (Signed)
Attempted to call report to floor, was told RN would call back 

## 2019-02-24 NOTE — ED Notes (Signed)
Pt c/o lower back pain since falling on Saturday. Pt states that his legs gave out and he fell. Pt denies hitting his head. Pt states that he has numbness in both feet but this is a chronic issue. Pt denies loss of bowel or bladder control. Pt states that his pain has progressively gotten worse over the week.

## 2019-02-24 NOTE — ED Notes (Signed)
Pt SpO2 88%, pt placed on [place] 2L

## 2019-02-25 ENCOUNTER — Other Ambulatory Visit: Payer: Self-pay

## 2019-02-25 DIAGNOSIS — G4733 Obstructive sleep apnea (adult) (pediatric): Secondary | ICD-10-CM

## 2019-02-25 DIAGNOSIS — I2510 Atherosclerotic heart disease of native coronary artery without angina pectoris: Secondary | ICD-10-CM

## 2019-02-25 DIAGNOSIS — I350 Nonrheumatic aortic (valve) stenosis: Secondary | ICD-10-CM

## 2019-02-25 DIAGNOSIS — I10 Essential (primary) hypertension: Secondary | ICD-10-CM

## 2019-02-25 DIAGNOSIS — I2583 Coronary atherosclerosis due to lipid rich plaque: Secondary | ICD-10-CM

## 2019-02-25 LAB — CBC
HCT: 45.3 % (ref 39.0–52.0)
Hemoglobin: 14.7 g/dL (ref 13.0–17.0)
MCH: 33 pg (ref 26.0–34.0)
MCHC: 32.5 g/dL (ref 30.0–36.0)
MCV: 101.6 fL — ABNORMAL HIGH (ref 80.0–100.0)
Platelets: 187 10*3/uL (ref 150–400)
RBC: 4.46 MIL/uL (ref 4.22–5.81)
RDW: 13.8 % (ref 11.5–15.5)
WBC: 6.9 10*3/uL (ref 4.0–10.5)
nRBC: 0 % (ref 0.0–0.2)

## 2019-02-25 LAB — BASIC METABOLIC PANEL
Anion gap: 8 (ref 5–15)
BUN: 17 mg/dL (ref 8–23)
CO2: 29 mmol/L (ref 22–32)
Calcium: 8.6 mg/dL — ABNORMAL LOW (ref 8.9–10.3)
Chloride: 102 mmol/L (ref 98–111)
Creatinine, Ser: 0.9 mg/dL (ref 0.61–1.24)
GFR calc Af Amer: >60 mL/min (ref >60)
GFR calc non Af Amer: >60 mL/min (ref >60)
Glucose, Bld: 100 mg/dL — ABNORMAL HIGH (ref 70–99)
Potassium: 4.1 mmol/L (ref 3.5–5.1)
Sodium: 139 mmol/L (ref 135–145)

## 2019-02-25 LAB — SARS CORONAVIRUS 2 (TAT 6-24 HRS): SARS Coronavirus 2: NEGATIVE

## 2019-02-25 MED ORDER — METOPROLOL SUCCINATE ER 25 MG PO TB24
25.0000 mg | ORAL_TABLET | Freq: Every day | ORAL | Status: DC
  Administered 2019-02-26: 25 mg via ORAL
  Filled 2019-02-25 (×2): qty 1

## 2019-02-25 MED ORDER — PRAMIPEXOLE DIHYDROCHLORIDE 0.25 MG PO TABS
0.2500 mg | ORAL_TABLET | Freq: Every day | ORAL | Status: DC
  Administered 2019-02-25: 0.25 mg via ORAL
  Filled 2019-02-25 (×2): qty 1

## 2019-02-25 MED ORDER — CLOPIDOGREL BISULFATE 75 MG PO TABS
75.0000 mg | ORAL_TABLET | Freq: Every day | ORAL | Status: DC
  Administered 2019-02-25 – 2019-02-26 (×2): 75 mg via ORAL
  Filled 2019-02-25 (×2): qty 1

## 2019-02-25 MED ORDER — ACETAMINOPHEN 500 MG PO TABS
1000.0000 mg | ORAL_TABLET | Freq: Three times a day (TID) | ORAL | Status: DC
  Administered 2019-02-25 – 2019-02-26 (×4): 1000 mg via ORAL
  Filled 2019-02-25 (×4): qty 2

## 2019-02-25 MED ORDER — TIOTROPIUM BROMIDE MONOHYDRATE 18 MCG IN CAPS
18.0000 ug | ORAL_CAPSULE | Freq: Every day | RESPIRATORY_TRACT | Status: DC
  Administered 2019-02-25 – 2019-02-26 (×2): 18 ug via RESPIRATORY_TRACT
  Filled 2019-02-25: qty 5

## 2019-02-25 MED ORDER — FUROSEMIDE 20 MG PO TABS
20.0000 mg | ORAL_TABLET | Freq: Every day | ORAL | Status: DC
  Administered 2019-02-26: 20 mg via ORAL
  Filled 2019-02-25: qty 1

## 2019-02-25 MED ORDER — PRAMIPEXOLE DIHYDROCHLORIDE 0.25 MG PO TABS
0.5000 mg | ORAL_TABLET | Freq: Every day | ORAL | Status: DC
  Administered 2019-02-25: 0.5 mg via ORAL
  Filled 2019-02-25 (×2): qty 2

## 2019-02-25 MED ORDER — FUROSEMIDE 10 MG/ML IJ SOLN
40.0000 mg | Freq: Every day | INTRAMUSCULAR | Status: DC
  Administered 2019-02-25 (×2): 40 mg via INTRAVENOUS
  Filled 2019-02-25: qty 4

## 2019-02-25 MED ORDER — LATANOPROST 0.005 % OP SOLN
1.0000 [drp] | Freq: Every day | OPHTHALMIC | Status: DC
  Administered 2019-02-25: 1 [drp] via OPHTHALMIC
  Filled 2019-02-25: qty 2.5

## 2019-02-25 MED ORDER — OXYCODONE HCL 5 MG PO TABS
5.0000 mg | ORAL_TABLET | ORAL | Status: DC | PRN
  Administered 2019-02-25 – 2019-02-26 (×5): 5 mg via ORAL
  Filled 2019-02-25 (×6): qty 1

## 2019-02-25 MED ORDER — AMIODARONE HCL 200 MG PO TABS
400.0000 mg | ORAL_TABLET | Freq: Every day | ORAL | Status: DC
  Administered 2019-02-25 – 2019-02-26 (×2): 400 mg via ORAL
  Filled 2019-02-25 (×2): qty 2

## 2019-02-25 MED ORDER — DORZOLAMIDE HCL-TIMOLOL MAL 2-0.5 % OP SOLN: 1.0000 [drp] | Freq: Two times a day (BID) | OPHTHALMIC | Status: DC

## 2019-02-25 MED ORDER — DORZOLAMIDE HCL 2 % OP SOLN
1.0000 [drp] | Freq: Two times a day (BID) | OPHTHALMIC | Status: DC
  Administered 2019-02-25 – 2019-02-26 (×3): 1 [drp] via OPHTHALMIC
  Filled 2019-02-25: qty 10

## 2019-02-25 MED ORDER — ONDANSETRON HCL 4 MG/2ML IJ SOLN: 4.0000 mg | Freq: Four times a day (QID) | INTRAMUSCULAR | Status: DC | PRN

## 2019-02-25 MED ORDER — HYDROMORPHONE HCL 1 MG/ML IJ SOLN
0.5000 mg | INTRAMUSCULAR | Status: DC | PRN
  Administered 2019-02-26 (×2): 0.5 mg via INTRAVENOUS
  Filled 2019-02-25 (×2): qty 1

## 2019-02-25 MED ORDER — MAGNESIUM HYDROXIDE 400 MG/5ML PO SUSP: 5.0000 mL | Freq: Every day | ORAL | Status: DC | PRN

## 2019-02-25 MED ORDER — DULOXETINE HCL 60 MG PO CPEP
60.0000 mg | ORAL_CAPSULE | Freq: Every day | ORAL | Status: DC
  Administered 2019-02-25 – 2019-02-26 (×2): 60 mg via ORAL
  Filled 2019-02-25 (×2): qty 1

## 2019-02-25 MED ORDER — TIMOLOL MALEATE 0.5 % OP SOLN
1.0000 [drp] | Freq: Two times a day (BID) | OPHTHALMIC | Status: DC
  Administered 2019-02-25 – 2019-02-26 (×3): 1 [drp] via OPHTHALMIC
  Filled 2019-02-25: qty 5

## 2019-02-25 MED ORDER — PANTOPRAZOLE SODIUM 40 MG PO TBEC
40.0000 mg | DELAYED_RELEASE_TABLET | Freq: Every day | ORAL | Status: DC
  Administered 2019-02-25 – 2019-02-26 (×2): 40 mg via ORAL
  Filled 2019-02-25 (×2): qty 1

## 2019-02-25 MED ORDER — SENNOSIDES-DOCUSATE SODIUM 8.6-50 MG PO TABS: 1.0000 | ORAL_TABLET | Freq: Two times a day (BID) | ORAL | Status: DC | PRN

## 2019-02-25 MED ORDER — ONDANSETRON HCL 4 MG PO TABS: 4.0000 mg | ORAL_TABLET | Freq: Four times a day (QID) | ORAL | Status: DC | PRN

## 2019-02-25 MED ORDER — POTASSIUM CHLORIDE CRYS ER 20 MEQ PO TBCR
40.0000 meq | EXTENDED_RELEASE_TABLET | Freq: Two times a day (BID) | ORAL | Status: AC
  Administered 2019-02-25 (×2): 40 meq via ORAL
  Filled 2019-02-25 (×2): qty 2

## 2019-02-25 MED ORDER — POLYETHYLENE GLYCOL 3350 17 G PO PACK
17.0000 g | PACK | Freq: Two times a day (BID) | ORAL | Status: DC
  Administered 2019-02-25 – 2019-02-26 (×3): 17 g via ORAL
  Filled 2019-02-25 (×3): qty 1

## 2019-02-25 MED ORDER — ATORVASTATIN CALCIUM 20 MG PO TABS
40.0000 mg | ORAL_TABLET | Freq: Every day | ORAL | Status: DC
  Administered 2019-02-25: 40 mg via ORAL
  Filled 2019-02-25: qty 2

## 2019-02-25 MED ORDER — ENOXAPARIN SODIUM 40 MG/0.4ML ~~LOC~~ SOLN
40.0000 mg | SUBCUTANEOUS | Status: DC
  Administered 2019-02-25 – 2019-02-26 (×2): 40 mg via SUBCUTANEOUS
  Filled 2019-02-25 (×2): qty 0.4

## 2019-02-25 MED ORDER — POLYETHYLENE GLYCOL 3350 17 G PO PACK: 17.0000 g | PACK | Freq: Two times a day (BID) | ORAL | Status: DC | PRN

## 2019-02-25 MED ORDER — ASPIRIN 81 MG PO CHEW
81.0000 mg | CHEWABLE_TABLET | Freq: Every day | ORAL | Status: DC
  Administered 2019-02-25 – 2019-02-26 (×2): 81 mg via ORAL
  Filled 2019-02-25 (×2): qty 1

## 2019-02-25 MED ORDER — BRIMONIDINE TARTRATE 0.15 % OP SOLN
1.0000 [drp] | Freq: Every day | OPHTHALMIC | Status: DC
  Administered 2019-02-25: 1 [drp] via OPHTHALMIC
  Filled 2019-02-25: qty 5

## 2019-02-25 NOTE — Evaluation (Signed)
Occupational Therapy Evaluation Patient Details Name: Donald Riley MRN: [id number] DOB: [birth year] Today's Date: 02/25/2019    History of Present Illness Pt is a 85 year old male presenting with S2 non-operative fracture. [place] last saturday 02/18/19 at funeral (walking on grass with RW), pain was progressively worsening, pain sought ED attention 02/24/19. PMH: lives alone with wife Afib not on [facility], CAD s/p PCI in 2018, sCHF EF 40-45%, prostate cancer, AS, OSA on CPAP, asthma, and HTN   Clinical Impression   Pt is 85 year old male s/p fall at funeral on 02/18/19 and sustained a S2 non-operative fracture.  Pt lives at home with his wife was independent in most ADLs except for socks and shoes due to worsening back pain.  He is eager to return to PLOF.  Pt currently requires min assist for LB dressing while in seated position due to pain and limited trunk flexion due to pain. Pain level was 3/10 throughout session but cooperative and motivated to regain independence in ADLs. He appeared to be in more pain than what he stated and asked for pain meds which NSG promptly brought during session.  Reviewed rec for AD for LB dressing and completed with min assist and cues and reviewed precautions when transferring to and from toilet and couch or chair at home.   Pt would benefit from instruction in dressing techniques with assistive devices for dressing and bathing skills.  Pt has a shower stall at home with a built in seat for bathing but might be too small to safely use and rec a shower chair with back as well as BSC over toilet and reacher, sock aid and elastic shoe laces for dressing (pt already has a LH shoe horn). Pt and his wife given an [employer] catalog to take home with rec reviewed.  Rec OT HH after discharge home.     Follow Up Recommendations  Home health OT    Equipment Recommendations  Tub/shower seat;Other (comment)(reacher, sock aid, shower chair with back)    Recommendations for Other  Services       Precautions / Restrictions Precautions Precautions: Fall Restrictions Weight Bearing Restrictions: No Other Position/Activity Restrictions: Weightbearing as tolerated, per Dr. Kennith Gain discussion with Dr. Mack Guise (per Dr Sabino Niemann note in chart and confirmed with NSG Estill Bamberg). No surgical intervention rec.      Mobility Bed Mobility Overal bed mobility: Modified Independent             General bed mobility comments: Increased time, use of bedrails  Transfers Overall transfer level: Needs assistance   Transfers: Sit to/from Stand Sit to Stand: Min guard         General transfer comment: gaurding for safety, cuing for set up/hand placement    Balance Overall balance assessment: Needs assistance Sitting-balance support: Bilateral upper extremity supported Sitting balance-Leahy Scale: Good     Standing balance support: No upper extremity supported Standing balance-Leahy Scale: Fair                             ADL either performed or assessed with clinical judgement   ADL Overall ADL's : Needs assistance/impaired Eating/Feeding: Independent;Set up   Grooming: Wash/dry hands;Wash/dry face;Oral care;Brushing hair;Applying deodorant;Independent;Set up;Sitting   Upper Body Bathing: Independent;Set up   Lower Body Bathing: Set up;Minimal assistance;Sit to/from stand   Upper Body Dressing : Independent;Set up   Lower Body Dressing: Minimal assistance;Set up;Sit to/from stand Lower Body Dressing Details (indicate  cue type and reason): Pt was able to complete LB dressing after ed and training with AD and mod cues.  He wears tight compression socks and needed min assist for this but able to use reacher to remove hospital socks and don with sock aid with min cues.             Functional mobility during ADLs: Min guard;Rolling walker General ADL Comments: Pt was able to ambulate with PT earlier from bed to door in room and back with min  guard and cueing for safety using RW.  He had increase in pain after sitting up in recliner after ambualting and requested pain meds which NSG brought immediately.     Vision Patient Visual Report: No change from baseline       Perception     Praxis      Pertinent Vitals/Pain Pain Assessment: 0-10 Pain Score: 4  Pain Location: tailbone and pelvis Pain Descriptors / Indicators: Grimacing;Aching Pain Intervention(s): Limited activity within patient's tolerance;Monitored during session;RN gave pain meds during session;Patient requesting pain meds-RN notified     Hand Dominance Right   Extremity/Trunk Assessment Upper Extremity Assessment Upper Extremity Assessment: Overall WFL for tasks assessed   Lower Extremity Assessment Lower Extremity Assessment: Defer to PT evaluation   Cervical / Trunk Assessment Cervical / Trunk Assessment: Kyphotic   Communication Communication Communication: No difficulties   Cognition Arousal/Alertness: Awake/alert Behavior During Therapy: WFL for tasks assessed/performed Overall Cognitive Status: Within Functional Limits for tasks assessed                                 General Comments: Pt is very kyphotic.   General Comments       Exercises Other Exercises Other Exercises: Supine > sit with cuing and modI with HOB elevated and rails. Able to complete without physical assistance, slowly Other Exercises: STS education on RW safety with stand for hand placement/set up with good carry over, able to stand CGA Other Exercises: Gait: heavy cuing needed for normalized gait and RW sequencing. Pt able to comply with most cuing for proper gait pattern, heavy post trunk lean pt is able to correct 50% with cuing.   Shoulder Instructions      Home Living Family/patient expects to be discharged to:: Private residence Living Arrangements: Spouse/significant other Available Help at Discharge: Family Type of Home: House Home Access:  Level entry     Home Layout: Multi-level Alternate Level Stairs-Number of Steps: 4 steps down to den with railing Alternate Level Stairs-Rails: Left Bathroom Shower/Tub: Occupational psychologist: Handicapped height Bathroom Accessibility: Yes How Accessible: Accessible via walker Home Equipment: Cane - single point;Walker - 2 wheels;Bedside commode;Adaptive equipment Adaptive Equipment: Long-handled shoe horn Additional Comments: Reports he does not have a shower seat which is rec to prevent falls as well as grab bars. He has access to a BSC to use over toilet per wife report.      Prior Functioning/Environment Level of Independence: Independent with assistive device(s)        Comments: Reports he uses a SPC in the community on flat surfaces like the grocery store, RW outside (what he was using when he fell) and furniture walks at home.        OT Problem List: Decreased strength;Decreased range of motion;Decreased activity tolerance;Impaired balance (sitting and/or standing);Pain;Decreased knowledge of use of DME or AE      OT Treatment/Interventions: Self-care/ADL  training;Patient/family education;Balance training    OT Goals(Current goals can be found in the care plan section) Acute Rehab OT Goals Patient Stated Goal: regain my ability to take care of my needs OT Goal Formulation: With patient/family Time For Goal Achievement: 03/11/19 Potential to Achieve Goals: Good ADL Goals Pt Will Perform Lower Body Dressing: with set-up;Independently;with adaptive equipment;sit to/from stand Pt Will Transfer to Toilet: Independently;with set-up;regular height toilet;bedside commode  OT Frequency: Min 1X/week   Barriers to D/C:            Co-evaluation              AM-PAC OT "6 Clicks" Daily Activity     Outcome Measure Help from another person eating meals?: None Help from another person taking care of personal grooming?: None Help from another person  toileting, which includes using toliet, bedpan, or urinal?: A Little Help from another person bathing (including washing, rinsing, drying)?: A Little Help from another person to put on and taking off regular upper body clothing?: None Help from another person to put on and taking off regular lower body clothing?: A Little 6 Click Score: 21   End of Session    Activity Tolerance: Patient tolerated treatment well Patient left: in chair;with call bell/phone within reach;with family/visitor present;with chair alarm set  OT Visit Diagnosis: History of falling (Z91.81);Unsteadiness on feet (R26.81);Pain Pain - Right/Left: (in middle of pelvis into buttocks)                Time: [id number] OT Time Calculation (min): 34 min Charges:  OT General Charges $OT Visit: 1 Visit OT Evaluation $OT Eval Low Complexity: 1 Low OT Treatments $Self Care/Home Management : 23-37 mins  Chrys Racer, OTR/L, [place] ascom [phone] 02/25/19, 1:37 PM

## 2019-02-25 NOTE — Plan of Care (Signed)
New care plan initiated 

## 2019-02-25 NOTE — Progress Notes (Signed)
OT Cancellation Note  Patient Details Name: Donald Riley MRN: [id number] DOB: Dec 13, 1933   Cancelled Treatment:    Reason Eval/Treat Not Completed: Medical issues which prohibited therapy Order received for OT evaluation and chart reviewed.  Pt fell while at a funeral and imaging revealed a pelvic fracture and awaiting Orthopedic consult.  Will hold OT evaluation until Ortho consult is completed with rec.  Will monitor until cleared for OT.  Chrys Racer, OTR/L, [facility] ascom [phone] 02/25/19, 9:34 AM

## 2019-02-25 NOTE — Evaluation (Addendum)
Physical Therapy Evaluation Patient Details Name: Donald Riley MRN: [id number] DOB: [birth year] Today's Date: 02/25/2019   History of Present Illness  Pt is a 85 year old male presenting with S2 non-operative fracture. [place] last saturday 02/18/19 at funeral (walking on grass with RW), pain was progressively worsening, pain sought ED attention 02/24/19. PMH: lives alone with wife Afib not on [facility], CAD s/p PCI in 2018, sCHF EF 40-45%, prostate cancer, AS, OSA on CPAP, asthma, and HTN  Clinical Impression  Patient presents with impairments in LE strength, trunk and hip ROM, postural deficits, and pain. Activity limitations in standing, walking, static and dynamic balance; inhibiting full participation in safe/IND ADLs. Patient is able to demonstrate modI transfers sup>sit, STS with increased time, cuing, and HOB elevated with rails. Able to ambulate to door <> recliner 16ft with RW and CGA, with heavy cuing for normalized gait. Patient with heavy post trunk lean, decreased push off bilat, decreased foot clearance bilat, and decreased stride. Patient with fatigue after activity and pain increase from 2/10 to 3/10. Recommendation at this time is 24 hour supervision (d/t fall risk) and HHPT, as patient does not have to utilize stairs in his home, and has accessible equipment. Would benefit from skilled PT to address above deficits and promote optimal return to PLOF.     Follow Up Recommendations Home health PT    Equipment Recommendations  Rolling walker with 5" wheels;3in1 (PT)    Recommendations for Other Services OT consult     Precautions / Restrictions Restrictions Weight Bearing Restrictions: No      Mobility  Bed Mobility Overal bed mobility: Modified Independent             General bed mobility comments: Increased time, use of bedrails  Transfers Overall transfer level: Needs assistance   Transfers: Sit to/from Stand Sit to Stand: Min guard         General transfer  comment: gaurding for safety, cuing for set up/hand placement  Ambulation/Gait Ambulation/Gait assistance: Min guard Gait Distance (Feet): 40 Feet Assistive device: Rolling walker (2 wheeled) Gait Pattern/deviations: Shuffle;Decreased stride length Gait velocity: decreased   General Gait Details: decreased push off bilat, post weight shift  Stairs            Wheelchair Mobility    Modified Rankin (Stroke Patients Only)       Balance Overall balance assessment: Needs assistance Sitting-balance support: Bilateral upper extremity supported Sitting balance-Leahy Scale: Good     Standing balance support: No upper extremity supported Standing balance-Leahy Scale: Fair                               Pertinent Vitals/Pain Pain Assessment: 0-10 Pain Score: 3  Pain Location: bilat hips Pain Descriptors / Indicators: Grimacing;Aching Pain Intervention(s): Limited activity within patient's tolerance    Home Living Family/patient expects to be discharged to:: Private residence Living Arrangements: Spouse/significant other Available Help at Discharge: Family Type of Home: House Home Access: Level entry     Home Layout: Multi-level Home Equipment: Kasandra Knudsen - single point;Walker - 2 wheels Additional Comments: Reports he does not have a shower seat    Prior Function Level of Independence: Independent with assistive device(s)         Comments: Reports he uses a SPC in the community on flat surfaces like the grocery store, RW outside (what he was using when he fell) and furniture walks at home  Hand Dominance        Extremity/Trunk Assessment   Upper Extremity Assessment Upper Extremity Assessment: Overall WFL for tasks assessed    Lower Extremity Assessment Lower Extremity Assessment: Overall WFL for tasks assessed    Cervical / Trunk Assessment Cervical / Trunk Assessment: Normal  Communication   Communication: No difficulties  Cognition  Arousal/Alertness: Awake/alert Behavior During Therapy: WFL for tasks assessed/performed Overall Cognitive Status: Within Functional Limits for tasks assessed                                        General Comments      Exercises Other Exercises Other Exercises: Supine > sit with cuing and modI with HOB elevated and rails. Able to complete without physical assistance, slowly Other Exercises: STS education on RW safety with stand for hand placement/set up with good carry over, able to stand CGA Other Exercises: Gait: heavy cuing needed for normalized gait and RW sequencing. Pt able to comply with most cuing for proper gait pattern, heavy post trunk lean pt is able to correct 50% with cuing.   Assessment/Plan    PT Assessment Patient needs continued PT services  PT Problem List Decreased strength;Decreased mobility;Decreased safety awareness;Decreased range of motion;Decreased coordination;Decreased activity tolerance;Decreased balance;Pain       PT Treatment Interventions DME instruction;Therapeutic activities;Gait training;Therapeutic exercise;Patient/family education;Stair training;Functional mobility training;Neuromuscular re-education;Manual techniques;Balance training    PT Goals (Current goals can be found in the Care Plan section)  Acute Rehab PT Goals Patient Stated Goal: Go home; get back to outpatient PT PT Goal Formulation: With patient Time For Goal Achievement: 03/11/19 Potential to Achieve Goals: Fair    Frequency 7X/week   Barriers to discharge        Co-evaluation               AM-PAC PT "6 Clicks" Mobility  Outcome Measure Help needed turning from your back to your side while in a flat bed without using bedrails?: A Little Help needed moving from lying on your back to sitting on the side of a flat bed without using bedrails?: A Little Help needed moving to and from a bed to a chair (including a wheelchair)?: A Little Help needed  standing up from a chair using your arms (e.g., wheelchair or bedside chair)?: A Little Help needed to walk in hospital room?: A Little Help needed climbing 3-5 steps with a railing? : A Lot 6 Click Score: 17    End of Session Equipment Utilized During Treatment: Gait belt Activity Tolerance: Patient tolerated treatment well Patient left: in chair;with call bell/phone within reach;with chair alarm set Nurse Communication: Mobility status PT Visit Diagnosis: Unsteadiness on feet (R26.81);Other abnormalities of gait and mobility (R26.89);Muscle weakness (generalized) (M62.81);History of falling (Z91.81);Repeated falls (R29.6);Difficulty in walking, not elsewhere classified (R26.2);Pain Pain - Right/Left: Right(bilat) Pain - part of body: Hip    Time: [id number] PT Time Calculation (min) (ACUTE ONLY): 29 min   Charges:   PT Evaluation $PT Eval Moderate Complexity: 1 Mod PT Treatments $Gait Training: 8-22 mins $Therapeutic Activity: 8-22 mins       Shelton Silvas PT, DPT  [facility] 02/25/2019, 11:25 AM

## 2019-02-25 NOTE — H&P (Signed)
History and Physical  Patient Name: Donald Riley     [id number]    DOB: 09/08/1933    DOA: 02/24/2019 PCP: Hortencia Pilar, MD  Patient coming from: Home  Chief Complaint: Pelvic pain, leg weakness      HPI: Donald Riley is a 85 y.o. M with hx Afib not on [facility], CAD s/p PCI in 2018, sCHF EF 40-45%, prostate cancer, AS, OSA on CPAP, asthma, and HTN who presents with 1 week progressive pelvic pain after a fall, now with inability to walk.  The patient was in his usual state of health (lives at home, walks short distances without a walker or cane, no dementia) until about 1 week ago he was at a funeral, fell landed on his bottom.  That evening he developed new pelvic pain, which has become progressively worse and is now severe.  In the meantime he has also had some worsening of his chronic bilateral lower extremity numbness, but no loss of bowel or bladder function.  Finally brought in to urgent care by family due to inability to walk, and was sent to the ER.  In the ER, hemodynamically stable, afebrile.  Electrolytes and renal function normal, complete blood count normal.  Lumbar radiographs unremarkable but MRI of the lumbar spine showed an acute sacral insufficiency fracture of the bilateral ala and across the S2 body.  No spinal cord impingement.  Patient required IV pain control he was also requiring supplemental oxygen, so the hospitalist service were asked to evaluate.         ROS: Review of Systems  Constitutional: Negative for chills and fever.  Respiratory: Negative for cough, sputum production, shortness of breath and wheezing.   Cardiovascular: Positive for leg swelling. Negative for chest pain.  Musculoskeletal: Positive for back pain and falls.  Neurological: Positive for sensory change and focal weakness (Bilateral legs).  All other systems reviewed and are negative.         Past Medical History:  Diagnosis Date  . Allergy to alpha-gal   . Aortic stenosis   .  Arthritis   . Asthma   . Atrial fibrillation ([place])   . Atrial fibrillation ([place])   . Cancer ([place]) 2002  . CHF (congestive heart failure) ([place])   . GERD (gastroesophageal reflux disease)   . Glaucoma   . Gout   . Hypertension   . Neuropathy   . Osteoporosis   . Sleep apnea     Past Surgical History:  Procedure Laterality Date  . ARTHROPLASTY Right    ankle  . BUNIONECTOMY    . CARDIAC CATHETERIZATION N/A 07/02/2015   Procedure: Right/Left Heart Cath and Coronary Angiography;  Surgeon: Teodoro Spray, MD;  Location: [facility];  Service: Cardiovascular;  Laterality: N/A;  . CORONARY/GRAFT ACUTE MI REVASCULARIZATION N/A 10/02/2016   Procedure: Coronary/Graft Acute MI Revascularization;  Surgeon: Wellington Hampshire, MD;  Location: [place] [facility];  Service: Cardiovascular;  Laterality: N/A;  . JOINT REPLACEMENT    . ostatectomy      Social History: Patient lives at home.  The patient walks sometimes with a walker, short distances without.  Nonsmoker.  Allergies  Allergen Reactions  . Galactose Swelling and Other (See Comments)  . Gabapentin Other (See Comments)  . Celecoxib Other (See Comments)    Other reaction(s): OTHER Patient experienced increased blood pressure.    Family history: family history includes Atrial fibrillation in his father and mother; Stroke in his father and mother.  Prior to Admission medications   Medication Sig Start Date End Date Taking? Authorizing Provider  albuterol (VENTOLIN HFA) 108 (90 BASE) MCG/ACT inhaler Inhale into the lungs every 4 (four) hours as needed.  01/30/14  Yes [provider]  amiodarone (PACERONE) 200 MG tablet 400 mg daily.  10/06/16  Yes [provider]  aspirin 81 MG chewable tablet Chew 1 tablet (81 mg total) by mouth daily. 10/06/16  Yes Demetrios Loll, MD  atorvastatin (LIPITOR) 40 MG tablet Take 1 tablet (40 mg total) by mouth daily at 6 PM. 10/06/16  Yes Demetrios Loll, MD  brimonidine (ALPHAGAN P) 0.1 %  SOLN Alphagan P 0.1 % eye drops   Yes [provider]  clopidogrel (PLAVIX) 75 MG tablet Take 1 tablet (75 mg total) by mouth daily with breakfast. 10/07/16  Yes Demetrios Loll, MD  dorzolamide-timolol (COSOPT) 22.3-6.8 MG/ML ophthalmic solution  08/20/16  Yes [provider]  DULoxetine (CYMBALTA) 60 MG capsule Take 60 mg by mouth daily.  10/27/18  Yes [provider]  EPINEPHrine 0.3 mg/0.3 mL IJ SOAJ injection Inject 0.3 mLs (0.3 mg total) into the muscle once as needed. 10/06/16  Yes Demetrios Loll, MD  furosemide (LASIX) 20 MG tablet Take 1 tablet (20 mg total) by mouth daily. 10/06/16  Yes Demetrios Loll, MD  latanoprost (XALATAN) 0.005 % ophthalmic solution Place 1 drop into both eyes at bedtime.  01/30/13  Yes [provider]  metoprolol succinate (TOPROL-XL) 25 MG 24 hr tablet metoprolol succinate ER 25 mg tablet,extended release 24 hr  Take 1 tablet every day by oral route.   Yes [provider]  MULTIPLE VITAMIN PO Take 1 tablet by mouth daily.    Yes [provider]  pantoprazole (PROTONIX) 40 MG tablet Take 40 mg by mouth daily. Reported on 07/02/2015 05/19/12  Yes [provider]  rosuvastatin (CRESTOR) 5 MG tablet Take 5 mg by mouth daily. 02/24/19  Yes [provider]  traMADol (ULTRAM) 50 MG tablet Take 1 tablet (50 mg total) by mouth every 8 (eight) hours as needed. 10/07/17  Yes Cook, Jayce G, DO  pramipexole (MIRAPEX) 0.25 MG tablet Take 0.25-0.5 mg by mouth 2 (two) times daily.     [provider]  tiotropium (SPIRIVA HANDIHALER) 18 MCG inhalation capsule Place 18 mcg into inhaler and inhale daily.  01/30/14   [provider]       Physical Exam: BP 138/84   Pulse 71   Temp 98.6 F (37 C) (Oral)   Resp 15   SpO2 94%  General appearance: Elderly adult male, alert and in moderate distress from pain.   Eyes: Anicteric, conjunctiva pink, lids and lashes normal. PERRL.    ENT: No nasal deformity,  discharge, epistaxis.  Hearing normal. OP moist without lesions.  Lips normal, dentition good. Neck: No neck masses.  Trachea midline.  No thyromegaly/tenderness. Lymph: No cervical or supraclavicular lymphadenopathy. Skin: Warm and dry.  No jaundice.  No suspicious rashes or lesions. Cardiac: RRR, nl [id number], loud systolic murmur.  Capillary refill is brisk.  JVP normal.  2+ bilateral LE edema.  Radial pulses 2+ and symmetric. Respiratory: Normal respiratory rate and rhythm.  Crackles at bases.  No wheezing. Abdomen: Abdomen soft.  No TTP or guarding. No ascites, distension, hepatosplenomegaly.   MSK: No deformities or effusions of the large joints of the upper or lower extremities bilaterally.  No cyanosis or clubbing. Neuro: Cranial nerves 3 through 12 intact.  Sensation intact to light touch  on the arms, but diminished in bilateral legs. Speech is fluent.  Muscle strength 5/5 in bilateral upper extremities, 4/5 in bilateral lower extremities, limited mostly by pain.    Psych: Sensorium intact and responding to questions, attention normal.  Behavior appropriate.  Affect pained.  Judgment and insight appear normal.     Labs on Admission:  I have personally reviewed following labs and imaging studies: CBC: Recent Labs  Lab 02/24/19 1437  WBC 7.7  HGB 14.7  HCT 45.6  MCV 101.3*  PLT [id number]   Basic Metabolic Panel: Recent Labs  Lab 02/24/19 1437  NA 134*  K 4.7  CL 100  CO2 28  GLUCOSE 105*  BUN 18  CREATININE 0.85  CALCIUM 8.5*   GFR: CrCl cannot be calculated (Unknown ideal weight.).         Radiological Exams on Admission: Personally reviewed MRI report, detailed above: Dg Lumbar Spine 2-3 Views  Result Date: 02/24/2019 CLINICAL DATA:  Low back pain since a fall 1 week ago. Initial encounter. EXAM: LUMBAR SPINE - 2-3 VIEW COMPARISON:  Plain films lumbar spine 01/25/2007. FINDINGS: There is no acute abnormality. Convex left scoliosis with the apex at L4-5 is worse than  on the prior exam. The patient has advanced multilevel facet degenerative disease and loss of disc space height. There appears to be autologous fusion across the L2-3 disc interspace. Paraspinous structures demonstrate mesh from hernia repair. Large stool burden noted. IMPRESSION: No acute abnormality. Advanced spondylosis has markedly progressed since the prior examination. Large colonic stool burden. Electronically Signed   By: Inge Rise M.D.   On: 02/24/2019 18:50   Mr Lumbar Spine Wo Contrast  Result Date: 02/24/2019 CLINICAL DATA:  Lower back pain since fall on Saturday EXAM: MRI LUMBAR SPINE WITHOUT CONTRAST TECHNIQUE: Multiplanar, multisequence MR imaging of the lumbar spine was performed. No intravenous contrast was administered. COMPARISON:  09/26/2014 FINDINGS: Segmentation:  5 lumbar type vertebrae Alignment: Mild retrolisthesis at the fused L1-2 level. Mild L5-S1 anterolisthesis that is unchanged Vertebrae: There is a band of marrow edema involving both sacral ala and crossing the S2 vertebral body. No displacement is seen along the visible anterior sacral foramina. No discitis or aggressive bone lesion. Conus medullaris and cauda equina: Conus extends to the L1-2 level. Conus and cauda equina appear normal. Small Tarlov cyst at S2 that is stable. Paraspinal and other soft tissues: Mild presacral edema and piriformis edema on the left. Disc levels: T12- L1: Spondylosis and disc narrowing.  No impingement L1-L2: Intervertebral ankylosis with narrow disc space causing mild right foraminal narrowing L2-L3: Advanced disc narrowing with endplate ridging and disc bulging. Degenerative facet spurring on both sides. Advanced right foraminal stenosis L3-L4: Prominent degenerative facet spurring. Narrowing of the disc with mild bulging. Mild bilateral foraminal narrowing L4-L5: Degenerative facet spurring and ligamentum flavum thickening. Degenerative disc narrowing. Mild bilateral foraminal narrowing  L5-S1:Advanced facet arthropathy with spurring and anterolisthesis. The disc is narrowed and there is endplate ridging. The canal and foramina remain patent IMPRESSION: 1. Acute sacral insufficiency fracture involving the bilateral ala and crossing the S2 body. No visualized displacement. 2. Multilevel disc and facet degeneration similar to 2016. There is notable right foraminal impingement at L2-3. Electronically Signed   By: Monte Fantasia M.D.   On: 02/24/2019 20:46           Assessment/Plan   S2 vertebral and sacral insufficiency fracture -Consult orthopedics, appreciate cares -PT eval -Weightbearing as tolerated, per Dr. Kennith Gain discussion with Dr. Mack Guise -  Start scheduled acetaminophen -As needed oxycodone or Dilaudid for severe range pain    Acute on chronic systolic CHF History of aortic stenosis Patient requires oxygen in the emergency room, also has peripheral edema/2+ lower extremity swelling on exam. -IV Lasix -Supplement potassium -Strict ins and outs -Daily BMP  Chronic atrial fibrillation Not on anticoagulation, unclear reason.  Heart rate controlled. -Continue metoprolol, amiodarone  Hypertension Coronary disease No active chest pain -Continue aspirin, Plavix, Lipitor, metoprolol  Sleep apnea -CPAP at night  Glaucoma -Continue eyedrops   Other medications -Continue duloxetine -Continue PPI -Continue Mirapex     DVT prophylaxis: Lovenox  Code Status: FULL  Family Communication:   Disposition Plan: Anticipate pain control overnight, PT eval Consults called: Orthopedics Admission status: Observation   At the point of initial evaluation, it is my clinical opinion that admission for OBSERVATION is reasonable and necessary because the patient's presenting complaints in the context of their chronic conditions represent sufficient risk of deterioration or significant morbidity to constitute reasonable grounds for close observation in the  hospital setting, but that the patient may be medically stable for discharge from the hospital within 24 to 48 hours.    Medical decision making: Patient seen at 11:27 PM on 02/24/2019.  The patient was discussed with Dr. Deboraha Sprang.  What exists of the patient's chart was reviewed in depth and summarized above.  Clinical condition: Stable.        [place] [facility] Please page though [place] or Epic secure chat:  For password, contact charge nurse

## 2019-02-25 NOTE — Progress Notes (Signed)
PROGRESS NOTE  Donald Riley [id number] DOB: February 07, 1934 DOA: 02/24/2019 PCP: Hortencia Pilar, MD  Brief History   85 year old man lives at home (able to walk short distances with a walker or cane, no dementia), complex PMH presented with progressive pelvic pain after a fall 1 week ago, now with inability to walk.  Worsening of chronic bilateral lower extremity numbness.  MRI lumbar spine showed acute sacral insufficiency fracture bilateral ala.  A & P  S2 vertebral and sacral insufficiency fracture --Orthopedics consultation pending, weight-bear as tolerated per chart.  Physical therapy evaluation recommended home health PT, 24-hour supervision. --Pain control, continue scheduled Tylenol, oxycodone as needed and hydromorphone as needed  Acute on chronic systolic CHF, with acute hypoxic respiratory failure, PMH aortic stenosis.  LVEF 40 to 45%. --IV Lasix overnight.  Now appears euvolemic.  Transition to oral Lasix.  Can stop telemetry.  Atrial fibrillation not on anticoagulation --Stable.  Continue metoprolol and amiodarone  CAD status post PCI 2018 --Asymptomatic.  Continue aspirin, Plavix, Lipitor, metoprolol  OSA on CPAP --CPAP at night  Glaucoma --Continue eyedrops  Constipation --Bowel regimen  PMH prostate cancer  DVT prophylaxis: enoxaparin Code Status: Full Family Communication: none Disposition Plan: pending improvement in pain control    Murray Hodgkins, MD  [facility] Direct contact: see [email or web address] (further directions at bottom of note if needed) 7PM-7AM contact night coverage as at bottom of note 02/25/2019, 7:36 AM  LOS: 1 day   Significant Hospital Events   . 11/27 admitted for acute pain secondary to sacral insufficiency fracture   Consults:  . Orthopedics   Procedures:  .   Significant Diagnostic Tests:  . EKG sinus rhythm, no acute changes, septal infarct, old . MRI lumbar spine showed acute sacral insufficiency fracture involving  bilateral ala crossing the S2 body.  Lumbar spine film showed no acute abnormality.  Advanced spondylolysis progressed since last examination.  Large colonic stool burden.   Micro Data:  .    Antimicrobials:  .   Interval History/Subjective  Less pain today but still has significant pain with movement of his legs.  No bowel or bladder dysfunction.  He has chronic bilateral lower extremity numbness secondary to peripheral neuropathy diagnosed about 10 years ago.  This has worsened since his fall.  Objective   Vitals:  Vitals:   02/25/19 0047 02/25/19 0300  BP: 127/82 121/68  Pulse: 69 72  Resp:  16  Temp: 97.9 F (36.6 C) 97.6 F (36.4 C)  SpO2: 97% 98%    Exam:  Constitutional.  Appears calm, comfortable eating breakfast. Respiratory.  Clear to auscultation bilaterally.  No wheezes, rales or rhonchi.  Normal respiratory effort. Cardiovascular.  Regular rate and rhythm.  No murmur, rub or gallop.  No lower extremity edema. Musculoskeletal.  Able to lift both legs off the bed but only with significant pain.  Range of motion markedly limited by pain. Neurologic.  Grossly intact sensation bilateral lower extremities. Psychiatric.  Grossly normal mood and affect.  Speech fluent and appropriate.  I have personally reviewed the following:   Today's Data  . Basic metabolic panel unremarkable . CBC unremarkable . SARS-CoV-2 pending  Scheduled Meds: . acetaminophen  1,000 mg Oral TID  . amiodarone  400 mg Oral Daily  . aspirin  81 mg Oral Daily  . atorvastatin  40 mg Oral q1800  . brimonidine  1 drop Both Eyes QHS  . clopidogrel  75 mg Oral Q breakfast  . dorzolamide  1 drop Both Eyes  BID   And  . timolol  1 drop Both Eyes BID  . DULoxetine  60 mg Oral Daily  . enoxaparin (LOVENOX) injection  40 mg Subcutaneous Q24H  . furosemide  40 mg Intravenous Daily  . latanoprost  1 drop Both Eyes QHS  . metoprolol succinate  25 mg Oral Daily  . pantoprazole  40 mg Oral Daily  .  potassium chloride  40 mEq Oral BID  . pramipexole  0.25 mg Oral QAC supper  . pramipexole  0.5 mg Oral QHS  . tiotropium  18 mcg Inhalation Daily   Continuous Infusions:  Principal Problem:   Pelvis fracture (HCC) Active Problems:   OSA (obstructive sleep apnea)   Atrial fibrillation, chronic (HCC)   Essential hypertension   Coronary artery disease due to lipid rich plaque   Acute on chronic systolic CHF (congestive heart failure) ([place])   Aortic stenosis   Pelvic fracture ([place])   LOS: 1 day   How to contact the [facility] Attending or Consulting provider 7A - 7P or covering provider during after hours [place], for this patient?  1. Check the care team in [facility] and look for a) attending/consulting [facility] provider listed and b) the [facility] team listed 2. Log into [email or web address] and use [place] universal password to access. If you do not have the password, please contact the hospital operator. 3. Locate the [facility] provider you are looking for under [facility] and page to a number that you can be directly reached. 4. If you still have difficulty reaching the provider, please page the [facility] (Director on Call) for the Hospitalists listed on amion for assistance.

## 2019-02-25 NOTE — Consult Note (Addendum)
ORTHOPAEDIC CONSULTATION  REQUESTING PHYSICIAN: Samuella Cota, MD  Chief Complaint: Low back pain status post fall  HPI: Donald Riley is a 85 y.o. male who was admitted overnight to the hospital service for increasing low back pain.  Patient sustained a fall at a funeral on 02/18/2019.  His pain is become progressively worse causing him to seek medical attention yesterday.  X-rays and an MRI were performed of the lumbar and sacral spine.  X-rays show degenerative disc disease throughout the lumbar spine and the MRI showed edema within the S2 segment of the sacrum.  Orthopedics is consulted for recommendations on management of the sacral fracture.  Patient has baseline lower extremity neuropathy which does not by the patient's assessment seem worse after his fall.  He denies bowel or bladder incontinence or retention.  Patient does describe weakness in the lower extremities which was making it difficult for him to ambulate at home but this seems improved today.  Patient is sitting up at in a chair and his wife is in the room.  Past Medical History:  Diagnosis Date  . Allergy to alpha-gal   . Aortic stenosis   . Arthritis   . Asthma   . Atrial fibrillation ([place])   . Atrial fibrillation ([place])   . Cancer ([place]) 2002  . CHF (congestive heart failure) ([place])   . GERD (gastroesophageal reflux disease)   . Glaucoma   . Gout   . Hypertension   . Neuropathy   . Osteoporosis   . Sleep apnea    Past Surgical History:  Procedure Laterality Date  . ARTHROPLASTY Right    ankle  . BUNIONECTOMY    . CARDIAC CATHETERIZATION N/A 07/02/2015   Procedure: Right/Left Heart Cath and Coronary Angiography;  Surgeon: Teodoro Spray, MD;  Location: [facility];  Service: Cardiovascular;  Laterality: N/A;  . CORONARY/GRAFT ACUTE MI REVASCULARIZATION N/A 10/02/2016   Procedure: Coronary/Graft Acute MI Revascularization;  Surgeon: Wellington Hampshire, MD;  Location: [facility];  Service:  Cardiovascular;  Laterality: N/A;  . JOINT REPLACEMENT    . ostatectomy     Social History   Socioeconomic History  . Marital status: Married    Spouse name: [name]  . Number of children: 2  . Years of education: Not on file  . Highest education level: Master's degree (e.g., MA, MS, MEng, MEd, MSW, MBA)  Occupational History  . Occupation: Retired Engineer, drilling  Social Needs  . Financial resource strain: Not hard at all  . Food insecurity    Worry: Never true    Inability: Never true  . Transportation needs    Medical: No    Non-medical: No  Tobacco Use  . Smoking status: Never Smoker  . Smokeless tobacco: Never Used  . Tobacco comment: NA  Substance and Sexual Activity  . Alcohol use: Yes    Alcohol/week: 7.0 standard drinks    Types: 7 Shots of liquor per week    Comment: rare  . Drug use: No  . Sexual activity: Not Currently  Lifestyle  . Physical activity    Days per week: 7 days    Minutes per session: 20 min  . Stress: Only a little  Relationships  . Social Herbalist on phone: Twice a week    Gets together: Once a week    Attends religious service: More than 4 times per year    Active member of club or organization: Yes  Attends meetings of clubs or organizations: More than 4 times per year    Relationship status: Married  Other Topics Concern  . Not on file  Social History Narrative  . Not on file   Family History  Problem Relation Age of Onset  . Stroke Mother   . Atrial fibrillation Mother   . Stroke Father   . Atrial fibrillation Father    Allergies  Allergen Reactions  . Galactose Swelling and Other (See Comments)  . Gabapentin Other (See Comments)  . Celecoxib Other (See Comments)    Other reaction(s): OTHER Patient experienced increased blood pressure.   Prior to Admission medications   Medication Sig Start Date End Date Taking? Authorizing Provider  albuterol (VENTOLIN HFA) 108 (90 BASE) MCG/ACT inhaler Inhale into the lungs  every 4 (four) hours as needed.  01/30/14  Yes [provider]  amiodarone (PACERONE) 200 MG tablet 400 mg daily.  10/06/16  Yes [provider]  aspirin 81 MG chewable tablet Chew 1 tablet (81 mg total) by mouth daily. 10/06/16  Yes Demetrios Loll, MD  atorvastatin (LIPITOR) 40 MG tablet Take 1 tablet (40 mg total) by mouth daily at 6 PM. 10/06/16  Yes Demetrios Loll, MD  brimonidine (ALPHAGAN P) 0.1 % SOLN Alphagan P 0.1 % eye drops   Yes [provider]  clopidogrel (PLAVIX) 75 MG tablet Take 1 tablet (75 mg total) by mouth daily with breakfast. 10/07/16  Yes Demetrios Loll, MD  dorzolamide-timolol (COSOPT) 22.3-6.8 MG/ML ophthalmic solution  08/20/16  Yes [provider]  DULoxetine (CYMBALTA) 60 MG capsule Take 60 mg by mouth daily.  10/27/18  Yes [provider]  EPINEPHrine 0.3 mg/0.3 mL IJ SOAJ injection Inject 0.3 mLs (0.3 mg total) into the muscle once as needed. 10/06/16  Yes Demetrios Loll, MD  furosemide (LASIX) 20 MG tablet Take 1 tablet (20 mg total) by mouth daily. 10/06/16  Yes Demetrios Loll, MD  latanoprost (XALATAN) 0.005 % ophthalmic solution Place 1 drop into both eyes at bedtime.  01/30/13  Yes [provider]  metoprolol succinate (TOPROL-XL) 25 MG 24 hr tablet metoprolol succinate ER 25 mg tablet,extended release 24 hr  Take 1 tablet every day by oral route.   Yes [provider]  MULTIPLE VITAMIN PO Take 1 tablet by mouth daily.    Yes [provider]  pantoprazole (PROTONIX) 40 MG tablet Take 40 mg by mouth daily. Reported on 07/02/2015 05/19/12  Yes [provider]  rosuvastatin (CRESTOR) 5 MG tablet Take 5 mg by mouth daily. 02/24/19  Yes [provider]  traMADol (ULTRAM) 50 MG tablet Take 1 tablet (50 mg total) by mouth every 8 (eight) hours as needed. 10/07/17  Yes Cook, Jayce G, DO  pramipexole (MIRAPEX) 0.25 MG tablet Take 0.25-0.5 mg by mouth 2 (two) times daily.     [provider]  tiotropium  (SPIRIVA HANDIHALER) 18 MCG inhalation capsule Place 18 mcg into inhaler and inhale daily.  01/30/14   [provider]   Dg Lumbar Spine 2-3 Views  Result Date: 02/24/2019 CLINICAL DATA:  Low back pain since a fall 1 week ago. Initial encounter. EXAM: LUMBAR SPINE - 2-3 VIEW COMPARISON:  Plain films lumbar spine 01/25/2007. FINDINGS: There is no acute abnormality. Convex left scoliosis with the apex at L4-5 is worse than on the prior exam. The patient has advanced multilevel facet degenerative disease and loss of disc space height. There appears to be autologous fusion across the L2-3 disc interspace.  Paraspinous structures demonstrate mesh from hernia repair. Large stool burden noted. IMPRESSION: No acute abnormality. Advanced spondylosis has markedly progressed since the prior examination. Large colonic stool burden. Electronically Signed   By: Inge Rise M.D.   On: 02/24/2019 18:50   Mr Lumbar Spine Wo Contrast  Result Date: 02/24/2019 CLINICAL DATA:  Lower back pain since fall on Saturday EXAM: MRI LUMBAR SPINE WITHOUT CONTRAST TECHNIQUE: Multiplanar, multisequence MR imaging of the lumbar spine was performed. No intravenous contrast was administered. COMPARISON:  09/26/2014 FINDINGS: Segmentation:  5 lumbar type vertebrae Alignment: Mild retrolisthesis at the fused L1-2 level. Mild L5-S1 anterolisthesis that is unchanged Vertebrae: There is a band of marrow edema involving both sacral ala and crossing the S2 vertebral body. No displacement is seen along the visible anterior sacral foramina. No discitis or aggressive bone lesion. Conus medullaris and cauda equina: Conus extends to the L1-2 level. Conus and cauda equina appear normal. Small Tarlov cyst at S2 that is stable. Paraspinal and other soft tissues: Mild presacral edema and piriformis edema on the left. Disc levels: T12- L1: Spondylosis and disc narrowing.  No impingement L1-L2: Intervertebral ankylosis with narrow disc space  causing mild right foraminal narrowing L2-L3: Advanced disc narrowing with endplate ridging and disc bulging. Degenerative facet spurring on both sides. Advanced right foraminal stenosis L3-L4: Prominent degenerative facet spurring. Narrowing of the disc with mild bulging. Mild bilateral foraminal narrowing L4-L5: Degenerative facet spurring and ligamentum flavum thickening. Degenerative disc narrowing. Mild bilateral foraminal narrowing L5-S1:Advanced facet arthropathy with spurring and anterolisthesis. The disc is narrowed and there is endplate ridging. The canal and foramina remain patent IMPRESSION: 1. Acute sacral insufficiency fracture involving the bilateral ala and crossing the S2 body. No visualized displacement. 2. Multilevel disc and facet degeneration similar to 2016. There is notable right foraminal impingement at L2-3. Electronically Signed   By: Monte Fantasia M.D.   On: 02/24/2019 20:46    Positive ROS: All other systems have been reviewed and were otherwise negative with the exception of those mentioned in the HPI and as above.  Physical Exam: General: Alert, appears uncomfortable, but in no acute distress  MUSCULOSKELETAL: Bilateral lower extremities: Patient has palpable pedal pulses, intact sensation to light touch and intact motor function including flexion extension of his toes as well as dorsiflexion plantarflexion of both ankles.  Patient is able to actively extend both knees.  He had tenderness over the top of his sacral spine and to a lesser extent the lower lumbar spine.  His skin was intact overlying these areas without erythema ecchymosis or significant swelling.  Assessment: Nondisplaced S2 fracture status post fall Significant lumbar degenerative disc disease  Plan: I reviewed the patient's MRI. I have also read the patient's physical and Occupational Therapy notes.  Patient will not require surgical intervention for his sacral fracture.  His lower extremity weakness  may be secondary to pain versus exacerbation of pre-existing lumbar degenerative disc disease.  Patient has no evidence of neurologic deficit on exam today.  His pain should be treated symptomatically.  He may continue to weight-bear as tolerated on the bilateral lower extremities.  Patient should use an assist device while walking while he has having sacral pain to avoid falling.  Patient may follow-up in the office in 2 weeks if he continues to have pain.  Continue physical and occupational therapy.  Continue current pain management.   Thornton Park, MD    02/25/2019 2:37 PM

## 2019-02-26 DIAGNOSIS — M533 Sacrococcygeal disorders, not elsewhere classified: Secondary | ICD-10-CM

## 2019-02-26 DIAGNOSIS — S3282XA Multiple fractures of pelvis without disruption of pelvic ring, initial encounter for closed fracture: Secondary | ICD-10-CM

## 2019-02-26 DIAGNOSIS — I482 Chronic atrial fibrillation, unspecified: Secondary | ICD-10-CM

## 2019-02-26 DIAGNOSIS — I5023 Acute on chronic systolic (congestive) heart failure: Secondary | ICD-10-CM

## 2019-02-26 MED ORDER — OXYCODONE HCL 5 MG PO TABS: 5.0000 mg | ORAL_TABLET | Freq: Four times a day (QID) | ORAL | 0 refills | Status: AC | PRN

## 2019-02-26 MED ORDER — TRAMADOL HCL 50 MG PO TABS: 50.0000 mg | ORAL_TABLET | Freq: Three times a day (TID) | ORAL | Status: AC | PRN

## 2019-02-26 MED ORDER — OXYCODONE HCL 5 MG PO TABS: 5.0000 mg | ORAL_TABLET | ORAL | Status: DC | PRN

## 2019-02-26 MED ORDER — POLYETHYLENE GLYCOL 3350 17 G PO PACK: 17.0000 g | PACK | Freq: Two times a day (BID) | ORAL | 0 refills | Status: AC

## 2019-02-26 MED ORDER — ACETAMINOPHEN 325 MG PO TABS: 650.0000 mg | ORAL_TABLET | Freq: Four times a day (QID) | ORAL | Status: AC | PRN

## 2019-02-26 NOTE — TOC Transition Note (Signed)
Transition of Care [facility]) - CM/SW Discharge Note   Patient Details  Name: Donald Riley MRN: [id number] Date of Birth: May 04, 1933  [facility] CM/SW Contact:  Ross Ludwig, LCSW Phone Number: 02/26/2019, 11:23 AM   Clinical Narrative:    CSW spoke to patient and his wife, they did not have a preference for home health agency.  CSW contacted [facility] and spoke to [place], she is able to accept patient.  CSW spoke to patient and his wife, they do not need any equipment they have a bedside commode and walker at home.  Patient is alert and oriented x4, and aware of discharge back home today.   Final next level of care: [place] Barriers to Discharge: Barriers Resolved   Patient Goals and CMS Choice Patient states their goals for this hospitalization and ongoing recovery are:: To return back home with home health PT and OT. CMS Medicare.gov Compare Post Acute Care list provided to:: Patient Choice offered to / list presented to : Patient  Discharge Placement  Patient will be discharging back home today with wife.                 Discharge Plan and Services                DME Arranged: N/A DME Agency: NA       HH Arranged: PT, OT HH Agency: [facility] Date HH Agency Contacted: 02/26/19 Time [place]: [id number] Representative spoke with at [place]: [place] Determinants of Health ([place]) Interventions     Readmission Risk Interventions No flowsheet data found.

## 2019-02-26 NOTE — Plan of Care (Signed)
Discharge instructions reviewed with patient and wife; acknowledged understanding. Patient eager to go home and escorted via wheelchair to hospital main entrance

## 2019-02-26 NOTE — Discharge Summary (Addendum)
Physician Discharge Summary  Donald Riley [id number] DOB: [birth year] DOA: 02/24/2019  PCP: Hortencia Pilar, MD  Admit date: 02/24/2019 Discharge date: 02/26/2019  Recommendations for Outpatient Follow-up:  1. Follow-up sacral insufficiency fracture, ST vertebral fracture  Follow-up Information    Hortencia Pilar, MD Follow up.   Specialty: Family Medicine Why: as needed Contact information: [place] [address] [phone]        Thornton Park, MD. Schedule an appointment as soon as possible for a visit in 2 week(s).   Specialty: Orthopedic Surgery Why: follow-up sacral fractures, pain Contact information: [place] [address] [phone]            Discharge Diagnoses: Principal diagnosis is #1 1. S2 vertebral and sacral insufficiency fracture with associated pain. 2. Acute on chronic systolic CHF, with acute hypoxic respiratory failure, PMH aortic stenosis 3. Atrial fibrillation not on anticoagulation 4. CAD status post PCI 2018 5. Chronic constipation  Discharge Condition: improved Disposition: home with HH PT, OT  Diet recommendation: 2gm sodium/heart healthy  Filed Weights   02/25/19 0047 02/25/19 0300  Weight: 83 kg 83 kg    History of present illness:  85 year old man lives at home (able to walk short distances with a walker or cane, no dementia), complex PMH presented with progressive pelvic pain after a fall 1 week ago, now with inability to walk.  Worsening of chronic bilateral lower extremity numbness.  MRI lumbar spine showed acute sacral insufficiency fracture bilateral ala.  Hospital Course:  Treated with pain control, evaluated by orthopedics, physical and Occupational Therapy.  Fortunately no surgical intervention needed.  Plan is weight-bear as tolerated, pain control, home health physical and Occupational Therapy.  Patient lives with his wife.  Individual issues as below.  S2 vertebral and  sacral insufficiency fracture with associated pain --Seen by orthopedics Dr. Mack Guise.  No surgical intervention recommended.  Recommended treating pain symptomatically.  May continue to bear weight as tolerated.  Use assist device while walking while having sacral pain to avoid falling.  Follow-up in the office in 2 weeks if continues to have pain.  Continue physical and Occupational Therapy.   --Physical therapy evaluation recommended home health PT, 24-hour supervision.  Occupational Therapy recommended home health. --Pain fairly well controlled.  We will continue Tylenol, oxycodone on discharge.  Acute on chronic systolic CHF, with acute hypoxic respiratory failure, PMH aortic stenosis.  LVEF 40 to 45%. --Treated very briefly with IV Lasix.  Appears near euvolemia.  Continue oral Lasix.  Atrial fibrillation not on anticoagulation --Remained stable.  Continue metoprolol and amiodarone  CAD status post PCI 2018 --Remained asymptomatic.    Continue aspirin, Plavix, Lipitor, metoprolol on discharge  OSA on CPAP --Stable.  CPAP at night  Glaucoma --Stable.  Continue eyedrops  Constipation --Continue bowel regimen on discharge.  Patient has chronic constipation which he usually treats with milk of magnesia.  Have added twice daily MiraLAX.  I discussed side effects of narcotics in particular constipation which is likely to worsen.  Discussed bowel movement at least every other day as goal.  Significant Hospital Events    11/27 admitted for acute pain secondary to sacral insufficiency fracture  Consults:   Orthopedics  Procedures:     Significant Diagnostic Tests:   EKG sinus rhythm, no acute changes, septal infarct, old  MRI lumbar spine showed acute sacral insufficiency fracture involving bilateral ala crossing the S2 body.  Lumbar spine film showed no acute abnormality.  Advanced spondylolysis  progressed since last examination.  Large colonic stool burden.  Today's  assessment: S: Feels okay today.  Continues to have significant pain but feels like he would be able to manage at home.  No bowel or bladder dysfunction.  Chronic constipation noted.   O: Vitals:  Vitals:   02/25/19 2319 02/26/19 0807  BP: 118/79 128/76  Pulse: 61 69  Resp: 18 17  Temp: 97.9 F (36.6 C) 98 F (36.7 C)  SpO2: 97% 97%    Constitutional:   Appears calm and comfortable sitting in chair eating breakfast. Respiratory:   CTA bilaterally, no w/r/r.   Respiratory effort normal.  Cardiovascular:   RRR, no rub or gallop.  2/6 systolic murmur.  No significant LE extremity edema   Musculoskeletal:   RLE, LLE    Strength limited by pain.  Able to lift both legs off. Skin:   Chronic skin changes bilateral lower extremities. Neurologic:   Sensation grossly intact bilateral lower extremities. Psychiatric:   judgement and insight appear normal  Mental status o Mood, affect appropriate  No new data  Discharge Instructions  Discharge Instructions    Diet - low sodium heart healthy   Complete by: As directed    Discharge instructions   Complete by: As directed    Call your physician or seek immediate medical attention for increased pain, weakness, difficulty walking, increasing leg numbness, difficulty controlling bowel or bladder or worsening of condition.  Oxycodone is for severe pain only.  Be aware that this medication tends to cause constipation, can cause confusion.  Do not take this medication if you are going to drive or operate heavy machinery.  Do not take tramadol and oxycodone together.   Increase activity slowly   Complete by: As directed      Allergies as of 02/26/2019      Reactions   Galactose Swelling, Other (See Comments)   Gabapentin Other (See Comments)   Celecoxib Other (See Comments)   Other reaction(s): OTHER Patient experienced increased blood pressure.      Medication List    STOP taking these medications   atorvastatin 40  MG tablet Commonly known as: LIPITOR     TAKE these medications   acetaminophen 325 MG tablet Commonly known as: TYLENOL Take 2 tablets (650 mg total) by mouth every 6 (six) hours as needed for mild pain.   Alphagan P 0.1 % Soln Generic drug: brimonidine Alphagan P 0.1 % eye drops   amiodarone 200 MG tablet Commonly known as: PACERONE 400 mg daily.   aspirin 81 MG chewable tablet Chew 1 tablet (81 mg total) by mouth daily.   clopidogrel 75 MG tablet Commonly known as: PLAVIX Take 1 tablet (75 mg total) by mouth daily with breakfast.   dorzolamide-timolol 22.3-6.8 MG/ML ophthalmic solution Commonly known as: COSOPT   DULoxetine 60 MG capsule Commonly known as: CYMBALTA Take 60 mg by mouth daily.   EPINEPHrine 0.3 mg/0.3 mL Soaj injection Commonly known as: EPI-PEN Inject 0.3 mLs (0.3 mg total) into the muscle once as needed.   furosemide 20 MG tablet Commonly known as: LASIX Take 1 tablet (20 mg total) by mouth daily.   latanoprost 0.005 % ophthalmic solution Commonly known as: XALATAN Place 1 drop into both eyes at bedtime.   metoprolol succinate 25 MG 24 hr tablet Commonly known as: TOPROL-XL metoprolol succinate ER 25 mg tablet,extended release 24 hr  Take 1 tablet every day by oral route.   MULTIPLE VITAMIN PO Take 1 tablet by  mouth daily.   oxyCODONE 5 MG immediate release tablet Commonly known as: Oxy IR/ROXICODONE Take 1-2 tablets (5-10 mg total) by mouth every 6 (six) hours as needed for severe pain.   pantoprazole 40 MG tablet Commonly known as: PROTONIX Take 40 mg by mouth daily. Reported on 07/02/2015   polyethylene glycol 17 g packet Commonly known as: MIRALAX / GLYCOLAX Take 17 g by mouth 2 (two) times daily.   pramipexole 0.25 MG tablet Commonly known as: MIRAPEX Take 0.25-0.5 mg by mouth 2 (two) times daily.   rosuvastatin 5 MG tablet Commonly known as: CRESTOR Take 5 mg by mouth daily.   Spiriva HandiHaler 18 MCG inhalation  capsule Generic drug: tiotropium Place 18 mcg into inhaler and inhale daily.   traMADol 50 MG tablet Commonly known as: ULTRAM Take 1 tablet (50 mg total) by mouth every 8 (eight) hours as needed for moderate pain. What changed: reasons to take this   Ventolin HFA 108 (90 Base) MCG/ACT inhaler Generic drug: albuterol Inhale into the lungs every 4 (four) hours as needed.            Durable Medical Equipment  (From admission, onward)         Start     Ordered   02/26/19 0727  For home use only DME 3 n 1  Once     02/26/19 0726   02/26/19 0726  For home use only DME Walker rolling  Once    Question:  Patient needs a walker to treat with the following condition  Answer:  Sacral fracture ([place])   02/26/19 0725   02/26/19 0724  For home use only DME Shower stool  Once     02/26/19 [id number]         Allergies  Allergen Reactions   Galactose Swelling and Other (See Comments)   Gabapentin Other (See Comments)   Celecoxib Other (See Comments)    Other reaction(s): OTHER Patient experienced increased blood pressure.    The results of significant diagnostics from this hospitalization (including imaging, microbiology, ancillary and laboratory) are listed below for reference.    Significant Diagnostic Studies: Dg Lumbar Spine 2-3 Views  Result Date: 02/24/2019 CLINICAL DATA:  Low back pain since a fall 1 week ago. Initial encounter. EXAM: LUMBAR SPINE - 2-3 VIEW COMPARISON:  Plain films lumbar spine 01/25/2007. FINDINGS: There is no acute abnormality. Convex left scoliosis with the apex at L4-5 is worse than on the prior exam. The patient has advanced multilevel facet degenerative disease and loss of disc space height. There appears to be autologous fusion across the L2-3 disc interspace. Paraspinous structures demonstrate mesh from hernia repair. Large stool burden noted. IMPRESSION: No acute abnormality. Advanced spondylosis has markedly progressed since the prior examination.  Large colonic stool burden. Electronically Signed   By: Inge Rise M.D.   On: 02/24/2019 18:50   Mr Lumbar Spine Wo Contrast  Result Date: 02/24/2019 CLINICAL DATA:  Lower back pain since fall on Saturday EXAM: MRI LUMBAR SPINE WITHOUT CONTRAST TECHNIQUE: Multiplanar, multisequence MR imaging of the lumbar spine was performed. No intravenous contrast was administered. COMPARISON:  09/26/2014 FINDINGS: Segmentation:  5 lumbar type vertebrae Alignment: Mild retrolisthesis at the fused L1-2 level. Mild L5-S1 anterolisthesis that is unchanged Vertebrae: There is a band of marrow edema involving both sacral ala and crossing the S2 vertebral body. No displacement is seen along the visible anterior sacral foramina. No discitis or aggressive bone lesion. Conus medullaris and cauda equina: Conus extends to  the L1-2 level. Conus and cauda equina appear normal. Small Tarlov cyst at S2 that is stable. Paraspinal and other soft tissues: Mild presacral edema and piriformis edema on the left. Disc levels: T12- L1: Spondylosis and disc narrowing.  No impingement L1-L2: Intervertebral ankylosis with narrow disc space causing mild right foraminal narrowing L2-L3: Advanced disc narrowing with endplate ridging and disc bulging. Degenerative facet spurring on both sides. Advanced right foraminal stenosis L3-L4: Prominent degenerative facet spurring. Narrowing of the disc with mild bulging. Mild bilateral foraminal narrowing L4-L5: Degenerative facet spurring and ligamentum flavum thickening. Degenerative disc narrowing. Mild bilateral foraminal narrowing L5-S1:Advanced facet arthropathy with spurring and anterolisthesis. The disc is narrowed and there is endplate ridging. The canal and foramina remain patent IMPRESSION: 1. Acute sacral insufficiency fracture involving the bilateral ala and crossing the S2 body. No visualized displacement. 2. Multilevel disc and facet degeneration similar to 2016. There is notable right  foraminal impingement at L2-3. Electronically Signed   By: Monte Fantasia M.D.   On: 02/24/2019 20:46    Microbiology: Recent Results (from the past 240 hour(s))  SARS CORONAVIRUS 2 (TAT 6-24 HRS) Nasopharyngeal Nasopharyngeal Swab     Status: None   Collection Time: 02/24/19 10:38 PM   Specimen: Nasopharyngeal Swab  Result Value Ref Range Status   SARS Coronavirus 2 NEGATIVE NEGATIVE Final    Comment: (NOTE) SARS-CoV-2 target nucleic acids are NOT DETECTED. The SARS-CoV-2 RNA is generally detectable in upper and lower respiratory specimens during the acute phase of infection. Negative results do not preclude SARS-CoV-2 infection, do not rule out co-infections with other pathogens, and should not be used as the sole basis for treatment or other patient management decisions. Negative results must be combined with clinical observations, patient history, and epidemiological information. The expected result is Negative. Fact Sheet for Patients: SugarRoll.be Fact Sheet for Healthcare Providers: https://www.woods-mathews.com/ This test is not yet approved or cleared by the [place] FDA and  has been authorized for detection and/or diagnosis of SARS-CoV-2 by FDA under an Emergency Use Authorization (EUA). This EUA will remain  in effect (meaning this test can be used) for the duration of the COVID-19 declaration under Section 56 4(b)(1) of the Act, 21 U.S.C. section 360bbb-3(b)(1), unless the authorization is terminated or revoked sooner. Performed at [facility], [place] [address] [place], [place] [address]      Labs: Basic Metabolic Panel: Recent Labs  Lab 02/24/19 1437 02/25/19 0436  NA 134* 139  K 4.7 4.1  CL 100 102  CO2 28 29  GLUCOSE 105* 100*  BUN 18 17  CREATININE 0.85 0.90  CALCIUM 8.5* 8.6*    CBC: Recent Labs  Lab 02/24/19 1437 02/25/19 0436  WBC 7.7 6.9  HGB 14.7 14.7  HCT 45.6 45.3  MCV 101.3*  101.6*  PLT 198 187    Principal Problem:   Pelvis fracture (HCC) Active Problems:   OSA (obstructive sleep apnea)   Atrial fibrillation, chronic (HCC)   Essential hypertension   Coronary artery disease due to lipid rich plaque   Acute on chronic systolic CHF (congestive heart failure) ([place])   Aortic stenosis   Pelvic fracture ([place])   Time coordinating discharge: 35 minutes  Signed:  Murray Hodgkins, MD  [facility]  02/26/2019, 9:59 AM

## 2019-02-26 NOTE — Progress Notes (Signed)
Physical Therapy Treatment Patient Details Name: Donald Riley MRN: [id number] DOB: 25-Jun-1933 Today's Date: 02/26/2019    History of Present Illness Pt is a 85 year old male presenting with S2 non-operative fracture. [place] last saturday 02/18/19 at funeral (walking on grass with RW), pain was progressively worsening, pain sought ED attention 02/24/19. PMH: lives alone with wife Afib not on [facility], CAD s/p PCI in 2018, sCHF EF 40-45%, prostate cancer, AS, OSA on CPAP, asthma, and HTN    PT Comments    Pt in recliner upon arrival and agreeable to PT. Pt reporting increased pain this morning rating 6-7/10 and nursing providing pain medication during session. Pt performed seated LE therex with min cuing for correct performance. Pt limited by pain this session although progressing well towards PT goals. Pt ambulated with slow, cautious and antalgic gait pattern that improved t/o session. Pt required min guard assist t/o mobility this session. Pt steady during functional mobility with no instances of unsteadiness or LOB. Pt will benefit from skilled acute PT to continue progressing functional mobility and improving strength, ROM, balance and activity tolerance deficits to allow for improved independence. Home health PT remains appropriate following hospital discharge.    Follow Up Recommendations  Home health PT     Equipment Recommendations  Rolling walker with 5" wheels;3in1 (PT)    Recommendations for Other Services       Precautions / Restrictions Precautions Precautions: Fall Restrictions Weight Bearing Restrictions: No    Mobility  Bed Mobility               General bed mobility comments: in recliner upon arrival  Transfers Overall transfer level: Needs assistance Equipment used: Rolling walker (2 wheeled) Transfers: Sit to/from Stand Sit to Stand: Min guard         General transfer comment: min guard for safety, no physical assist required to rise, good carryover with  hand placement/use of RW  Ambulation/Gait Ambulation/Gait assistance: Min guard Gait Distance (Feet): 30 Feet Assistive device: Rolling walker (2 wheeled) Gait Pattern/deviations: Shuffle;Decreased stride length;Antalgic;Step-to pattern Gait velocity: decreased   General Gait Details: pt ambulated to door of room and back to recliner, pt antalgic today although improved during ambulation, increased reliance on UE support from RW, cautious and steady gait with pt stating he just needs to take him time, no unsteadiness, buckling or LOB   Stairs             Wheelchair Mobility    Modified Rankin (Stroke Patients Only)       Balance Overall balance assessment: Needs assistance Sitting-balance support: No upper extremity supported Sitting balance-Leahy Scale: Good     Standing balance support: Bilateral upper extremity supported;During functional activity Standing balance-Leahy Scale: Fair Standing balance comment: reliant on UE support during ambulation                            Cognition Arousal/Alertness: Awake/alert Behavior During Therapy: WFL for tasks assessed/performed Overall Cognitive Status: Within Functional Limits for tasks assessed                                        Exercises Total Joint Exercises Ankle Circles/Pumps: AROM;Both;15 reps Long Arc Quad: AROM;Both;10 reps Marching in Standing: AROM;Both;10 reps;Seated    General Comments        Pertinent Vitals/Pain Pain Score: 6  Pain  Location: tailbone and pelvis Pain Descriptors / Indicators: Grimacing;Aching Pain Intervention(s): Limited activity within patient's tolerance;Monitored during session;RN gave pain meds during session;Repositioned    Home Living                      Prior Function            PT Goals (current goals can now be found in the care plan section) Progress towards PT goals: Progressing toward goals    Frequency     7X/week      PT Plan Current plan remains appropriate    Co-evaluation              AM-PAC PT "6 Clicks" Mobility   Outcome Measure  Help needed turning from your back to your side while in a flat bed without using bedrails?: A Little Help needed moving from lying on your back to sitting on the side of a flat bed without using bedrails?: A Little Help needed moving to and from a bed to a chair (including a wheelchair)?: A Little Help needed standing up from a chair using your arms (e.g., wheelchair or bedside chair)?: A Little Help needed to walk in hospital room?: A Little Help needed climbing 3-5 steps with a railing? : A Lot 6 Click Score: 17    End of Session Equipment Utilized During Treatment: Gait belt Activity Tolerance: Patient tolerated treatment well;Patient limited by pain Patient left: in chair;with call bell/phone within reach Nurse Communication: Mobility status PT Visit Diagnosis: Unsteadiness on feet (R26.81);Other abnormalities of gait and mobility (R26.89);Muscle weakness (generalized) (M62.81);History of falling (Z91.81);Repeated falls (R29.6);Difficulty in walking, not elsewhere classified (R26.2);Pain Pain - Right/Left: Right(Bil) Pain - part of body: Hip(and low back)     Time: [id number] PT Time Calculation (min) (ACUTE ONLY): 27 min  Charges:  $Therapeutic Exercise: 8-22 mins                    [name] PT, DPT 10:10 AM,02/26/19 [phone]    [name] 02/26/2019, 10:07 AM

## 2019-06-07 MED ORDER — ACETAMINOPHEN 325 MG PO TABS
650.00 | ORAL_TABLET | ORAL | Status: DC
Start: 2019-06-07 — End: 2019-06-07

## 2019-06-07 MED ORDER — BRIMONIDINE TARTRATE 0.2 % OP SOLN
1.00 | OPHTHALMIC | Status: DC
Start: 2019-06-07 — End: 2019-06-07

## 2019-06-07 MED ORDER — LATANOPROST 0.005 % OP SOLN
1.00 | OPHTHALMIC | Status: DC
Start: 2019-06-07 — End: 2019-06-07

## 2019-06-07 MED ORDER — GENERIC EXTERNAL MEDICATION
Status: DC
Start: ? — End: 2019-06-07

## 2019-06-07 MED ORDER — DORZOLAMIDE HCL-TIMOLOL MAL 22.3-6.8 MG/ML OP SOLN
1.00 | OPHTHALMIC | Status: DC
Start: 2019-06-07 — End: 2019-06-07

## 2019-06-07 MED ORDER — FUROSEMIDE 20 MG PO TABS
20.00 | ORAL_TABLET | ORAL | Status: DC
Start: 2019-06-08 — End: 2019-06-07

## 2019-06-07 MED ORDER — FONDAPARINUX SODIUM 2.5 MG/0.5ML ~~LOC~~ SOLN
2.50 | SUBCUTANEOUS | Status: DC
Start: 2019-06-08 — End: 2019-06-07

## 2019-06-07 MED ORDER — IPRATROPIUM BROMIDE 0.02 % IN SOLN
500.00 | RESPIRATORY_TRACT | Status: DC
Start: 2019-06-07 — End: 2019-06-07

## 2019-06-07 MED ORDER — KETOTIFEN FUMARATE 0.025 % OP SOLN
1.00 | OPHTHALMIC | Status: DC
Start: ? — End: 2019-06-07

## 2019-06-07 MED ORDER — ASPIRIN 81 MG PO CHEW
81.00 | CHEWABLE_TABLET | ORAL | Status: DC
Start: 2019-06-08 — End: 2019-06-07

## 2019-06-07 MED ORDER — AMIODARONE HCL 200 MG PO TABS
200.00 | ORAL_TABLET | ORAL | Status: DC
Start: 2019-06-07 — End: 2019-06-07

## 2019-06-07 MED ORDER — DULOXETINE HCL 60 MG PO CPEP
60.00 | ORAL_CAPSULE | ORAL | Status: DC
Start: 2019-06-08 — End: 2019-06-07

## 2019-06-07 MED ORDER — CLOPIDOGREL BISULFATE 75 MG PO TABS
75.00 | ORAL_TABLET | ORAL | Status: DC
Start: 2019-06-08 — End: 2019-06-07

## 2019-06-07 MED ORDER — SODIUM CHLORIDE 3 % IN NEBU
4.00 | INHALATION_SOLUTION | RESPIRATORY_TRACT | Status: DC
Start: ? — End: 2019-06-07

## 2019-06-07 MED ORDER — BUPROPION HCL ER (XL) 150 MG PO TB24
150.00 | ORAL_TABLET | ORAL | Status: DC
Start: 2019-06-08 — End: 2019-06-07

## 2019-06-07 MED ORDER — PANTOPRAZOLE SODIUM 40 MG PO TBEC
40.00 | DELAYED_RELEASE_TABLET | ORAL | Status: DC
Start: 2019-06-08 — End: 2019-06-07

## 2019-06-07 MED ORDER — IBUPROFEN 100 MG/5ML PO SUSP
600.00 | ORAL | Status: DC
Start: 2019-06-07 — End: 2019-06-07

## 2019-06-07 MED ORDER — POLYETHYLENE GLYCOL 3350 17 GM/SCOOP PO POWD
17.00 | ORAL | Status: DC
Start: 2019-06-07 — End: 2019-06-07

## 2019-06-07 MED ORDER — PRAMIPEXOLE DIHYDROCHLORIDE 0.25 MG PO TABS
0.25 | ORAL_TABLET | ORAL | Status: DC
Start: ? — End: 2019-06-07

## 2019-06-08 DIAGNOSIS — I480 Paroxysmal atrial fibrillation: Secondary | ICD-10-CM | POA: Diagnosis not present

## 2019-06-08 DIAGNOSIS — G2581 Restless legs syndrome: Secondary | ICD-10-CM

## 2019-06-08 DIAGNOSIS — F39 Unspecified mood [affective] disorder: Secondary | ICD-10-CM | POA: Diagnosis not present

## 2019-06-08 DIAGNOSIS — S2231XA Fracture of one rib, right side, initial encounter for closed fracture: Secondary | ICD-10-CM

## 2019-06-08 DIAGNOSIS — G609 Hereditary and idiopathic neuropathy, unspecified: Secondary | ICD-10-CM | POA: Diagnosis not present

## 2019-06-08 DIAGNOSIS — I5022 Chronic systolic (congestive) heart failure: Secondary | ICD-10-CM | POA: Diagnosis not present

## 2019-06-11 IMAGING — US US CAROTID DUPLEX BILAT
1 series · 13 of 24 positions shown · non-contrast
Comparison: None.

CLINICAL DATA: Amaurosis fugax

EXAM:
BILATERAL CAROTID DUPLEX ULTRASOUND
TECHNIQUE: Gray scale imaging, color Doppler and duplex ultrasound were
performed of bilateral carotid and vertebral arteries in the neck.

[Series 1: us carotid duplex bilat · 0.06mm/px · 13 of 72 slices shown]
[im 1/72]
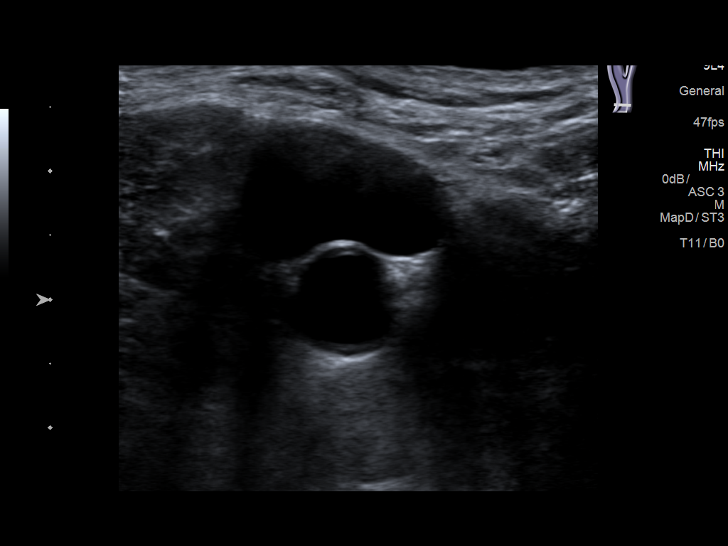
[im 7/72]
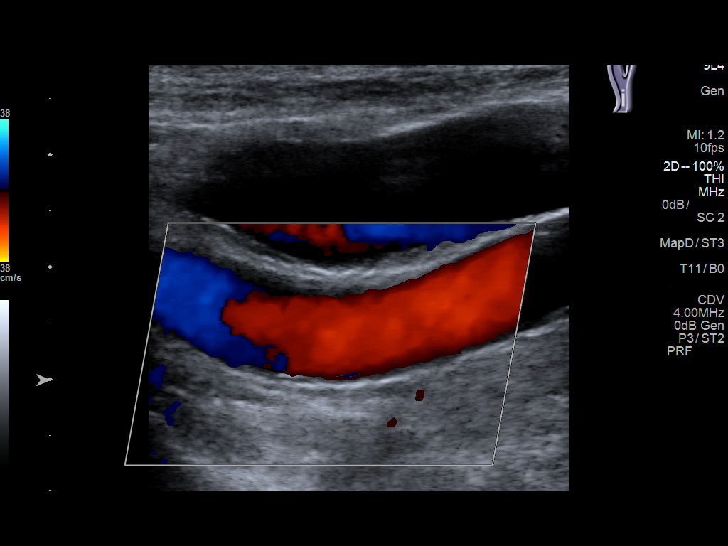
[im 13/72]
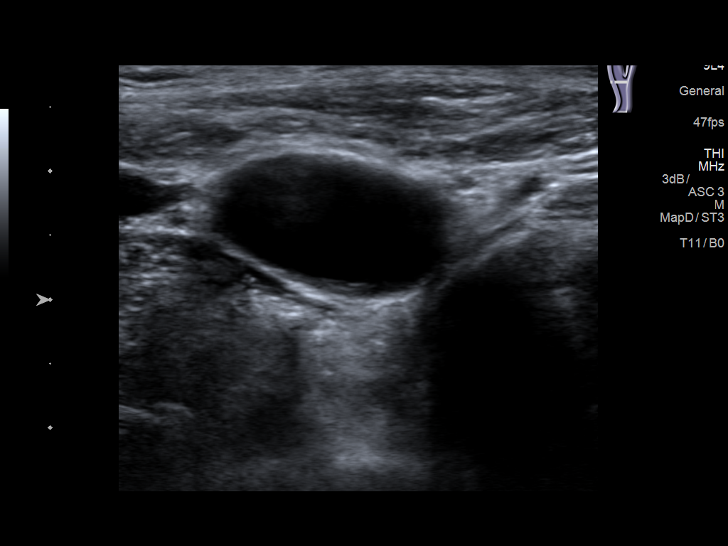
[im 19/72]
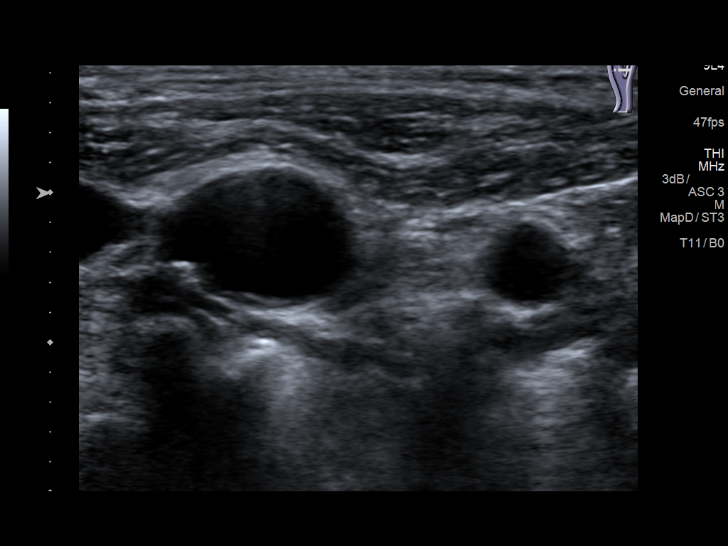
[im 25/72]
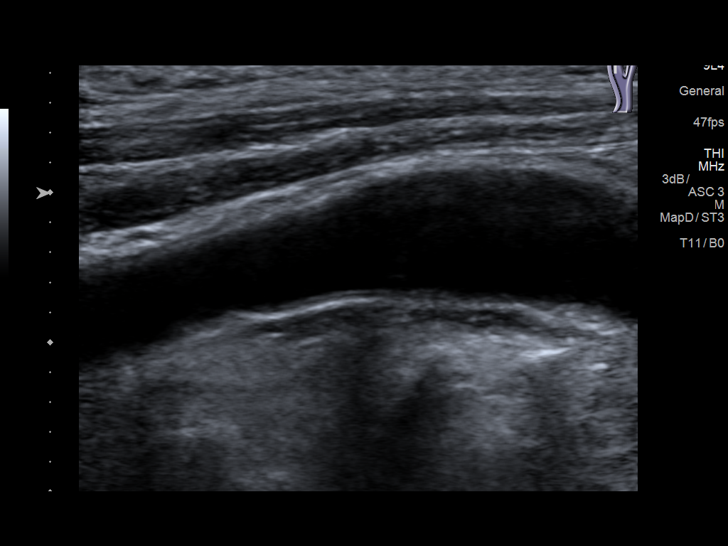
[im 31/72]
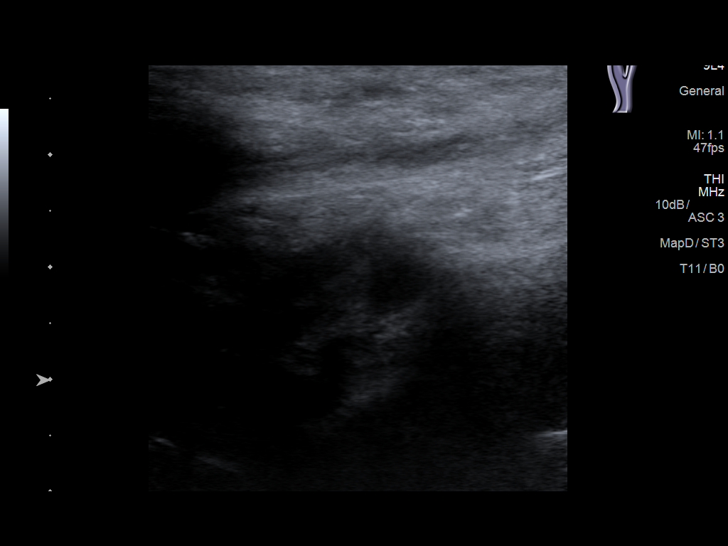
[im 38/72]
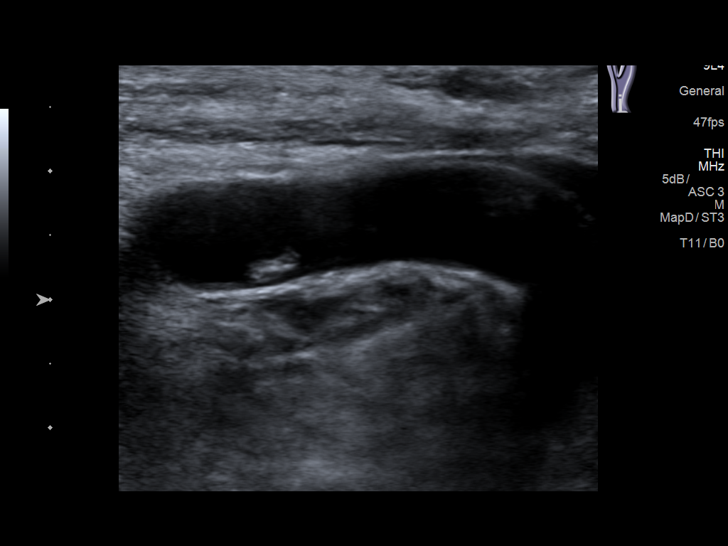
[im 41/72]
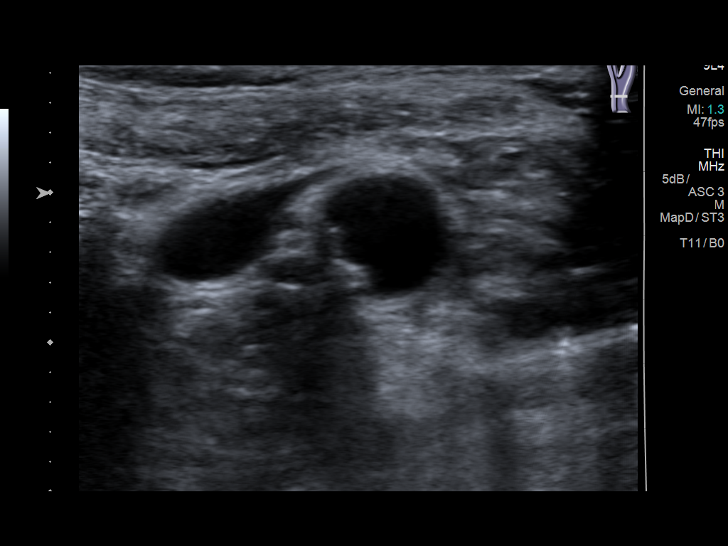
[im 47/72]
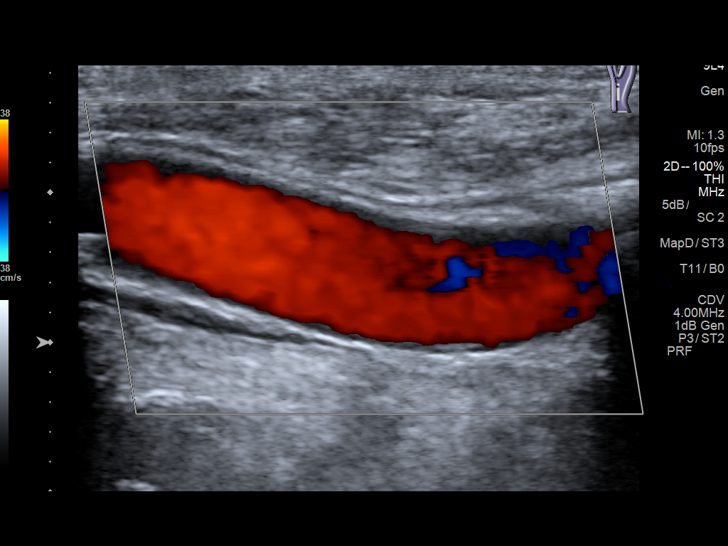
[im 53/72]
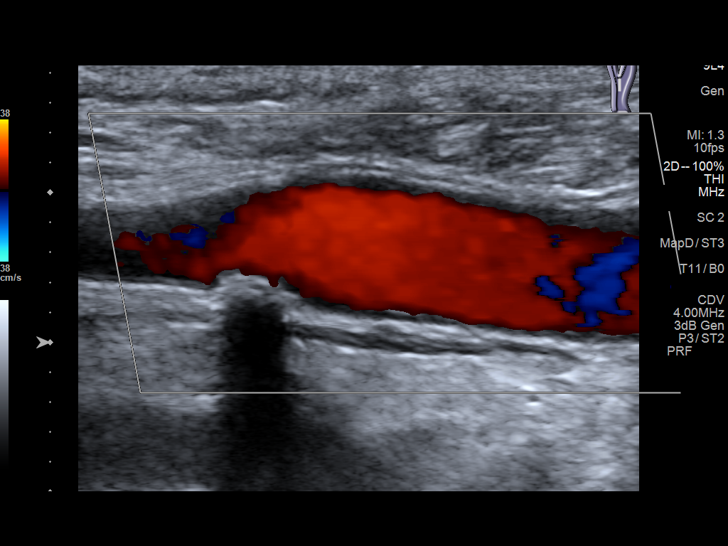
[im 59/72]
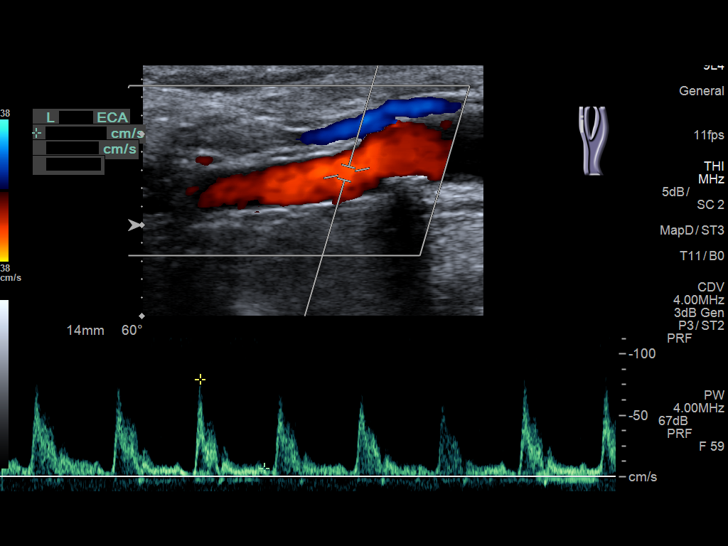
[im 65/72]
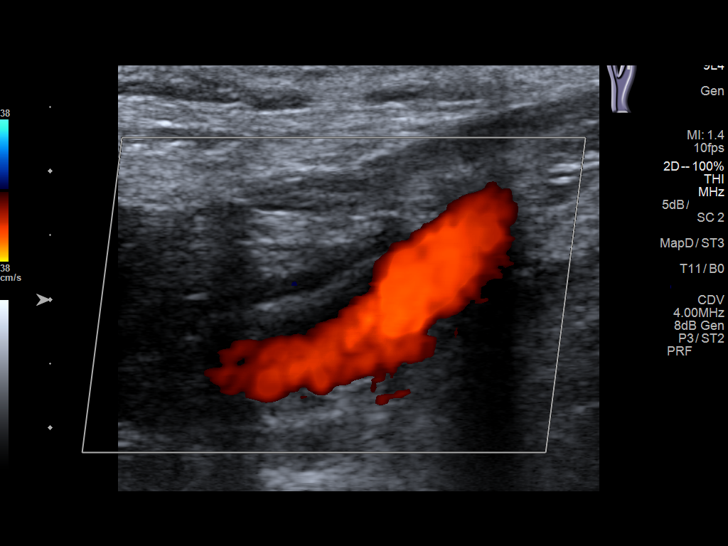
[im 72/72]
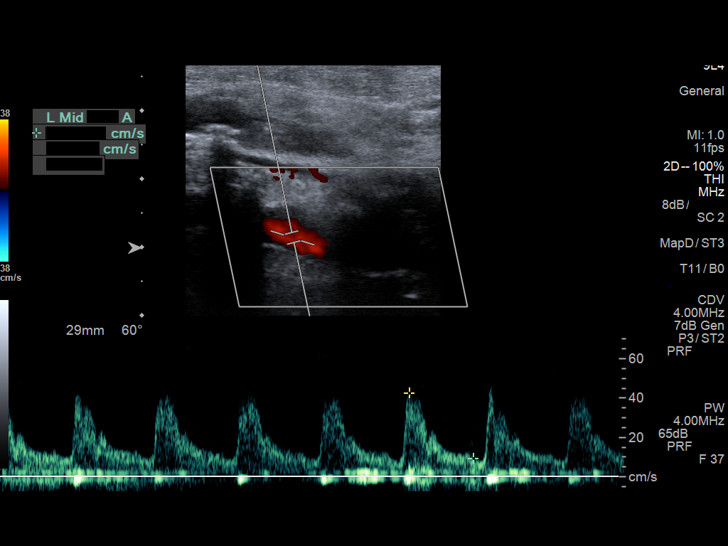

[13 of 24 positions shown; findings below may reference images not displayed]

FINDINGS: Criteria: Quantification of carotid stenosis is based on velocity
parameters that correlate the residual internal carotid diameter
with NASCET-based stenosis levels, using the diameter of the distal
internal carotid lumen as the denominator for stenosis measurement.

The following velocity measurements were obtained:

RIGHT

ICA:  60 cm/sec

CCA:  77 cm/sec

SYSTOLIC ICA/CCA RATIO:

DIASTOLIC ICA/CCA RATIO:

ECA:  94 cm/sec

LEFT

ICA:  70 cm/sec

CCA:  103 cm/sec

SYSTOLIC ICA/CCA RATIO:

DIASTOLIC ICA/CCA RATIO:

ECA:  89 cm/sec

RIGHT CAROTID ARTERY: Little if any plaque in the bulb. Low
resistance internal carotid Doppler pattern. Mild calcified plaque
in the lower internal carotid artery.

RIGHT VERTEBRAL ARTERY:  Antegrade.

LEFT CAROTID ARTERY: Mild focal calcified plaque in the mid common
carotid artery and bulb. Low resistance internal carotid Doppler
pattern.

LEFT VERTEBRAL ARTERY:  Antegrade.
IMPRESSION: Less than 50% stenosis in the right and left internal carotid
arteries.

## 2019-07-04 ENCOUNTER — Other Ambulatory Visit (INDEPENDENT_AMBULATORY_CARE_PROVIDER_SITE_OTHER): Payer: Self-pay | Admitting: Family Medicine

## 2019-07-04 DIAGNOSIS — R0989 Other specified symptoms and signs involving the circulatory and respiratory systems: Secondary | ICD-10-CM

## 2019-07-05 ENCOUNTER — Other Ambulatory Visit: Payer: Self-pay

## 2019-07-05 ENCOUNTER — Ambulatory Visit (INDEPENDENT_AMBULATORY_CARE_PROVIDER_SITE_OTHER): Payer: Medicare Other

## 2019-07-05 DIAGNOSIS — R0989 Other specified symptoms and signs involving the circulatory and respiratory systems: Secondary | ICD-10-CM | POA: Diagnosis not present

## 2019-08-15 ENCOUNTER — Encounter: Payer: Self-pay | Admitting: Physical Therapy

## 2019-08-15 ENCOUNTER — Other Ambulatory Visit: Payer: Self-pay

## 2019-08-15 ENCOUNTER — Ambulatory Visit: Payer: Medicare Other | Attending: Family Medicine | Admitting: Physical Therapy

## 2019-08-15 DIAGNOSIS — R2689 Other abnormalities of gait and mobility: Secondary | ICD-10-CM | POA: Insufficient documentation

## 2019-08-15 DIAGNOSIS — M6281 Muscle weakness (generalized): Secondary | ICD-10-CM | POA: Insufficient documentation

## 2019-08-15 NOTE — Therapy (Signed)
[place] MAIN [facility] SERVICES [address] [place], [place], [address] Phone: [phone]   Fax:  [phone]  Physical Therapy Evaluation  Patient Details  Name: Donald Riley MRN: [id number] Date of Birth: May 04, 1933 Referring Provider (PT): Hortencia Pilar   Encounter Date: 08/15/2019  PT End of Session - 08/15/19 1316    Visit Number  1    Number of Visits  17    Date for PT Re-Evaluation  10/03/19    PT Start Time  0105    PT Stop Time  0200    PT Time Calculation (min)  55 min    Equipment Utilized During Treatment  Gait belt    Activity Tolerance  Patient tolerated treatment well    Behavior During Therapy  WFL for tasks assessed/performed       Past Medical History:  Diagnosis Date  . Allergy to alpha-gal   . Aortic stenosis   . Arthritis   . Asthma   . Atrial fibrillation ([place])   . Atrial fibrillation ([place])   . Cancer ([place]) 2002  . CHF (congestive heart failure) ([place])   . GERD (gastroesophageal reflux disease)   . Glaucoma   . Gout   . Hypertension   . Neuropathy   . Osteoporosis   . Sleep apnea     Past Surgical History:  Procedure Laterality Date  . ARTHROPLASTY Right    ankle  . BUNIONECTOMY    . CARDIAC CATHETERIZATION N/A 07/02/2015   Procedure: Right/Left Heart Cath and Coronary Angiography;  Surgeon: Teodoro Spray, MD;  Location: [place] CV LAB;  Service: Cardiovascular;  Laterality: N/A;  . CORONARY/GRAFT ACUTE MI REVASCULARIZATION N/A 10/02/2016   Procedure: Coronary/Graft Acute MI Revascularization;  Surgeon: Wellington Hampshire, MD;  Location: [place] [facility];  Service: Cardiovascular;  Laterality: N/A;  . JOINT REPLACEMENT    . ostatectomy      There were no vitals filed for this visit.   Subjective Assessment - 08/15/19 1314    Subjective  Patient was here for back and hip pain , but it got better with medicine. Now he is here for balance.    Pertinent History  Patient fell and had a  pelvic fracture in Nov 2011, then he fell in march , had fx ribs and went to [facility] to recover. He has been in bed from being in the nursing home. He had HHPT but still is having weakness in BLE. He has back and hip pain.    How long can you sit comfortably?  unlimited    How long can you stand comfortably?  less than 15 minutes    How long can you walk comfortably?  intermediate distances    Patient Stated Goals  improve strength and walking    Currently in Pain?  Yes    Pain Score  1     Pain Location  Back    Pain Orientation  Lower    Pain Descriptors / Indicators  Aching    Pain Type  Chronic pain    Pain Radiating Towards  both hips    Pain Onset  More than a month ago    Pain Frequency  Intermittent    Aggravating Factors   motion    Pain Relieving Factors  rest    Effect of Pain on Daily Activities  unable to stand for very long    Multiple Pain Sites  No  [facility] PT Assessment - 08/15/19 1323      Assessment   Medical Diagnosis  balance , unsteady    Referring Provider (PT)  Grandis Heidi    Onset Date/Surgical Date  08/09/19    Hand Dominance  Right    Prior Therapy  HHPT      Precautions   Precautions  Fall      Restrictions   Weight Bearing Restrictions  No      Balance Screen   Has the patient fallen in the past 6 months  Yes    How many times?  2    Has the patient had a decrease in activity level because of a fear of falling?   Yes    Is the patient reluctant to leave their home because of a fear of falling?   No      Home Environment   Living Environment  Private residence    Living Arrangements  Spouse/significant other    Type of [place] Access  Level entry    Home Layout  One level    [place] - 4 wheels;Tub bench;Wheelchair - manual    Additional Comments  Lives in single story home, no stairs to enter, 1 step inside with railing; uses rail when negotiating step; usually negotiates steps one at a time; mod I for  self care, takes longer      Prior Function   Level of Independence  Independent with household mobility with device      Cognition   Overall Cognitive Status  Within Functional Limits for tasks assessed         POSTURE: flexed posture   PROM/AROM: WFL BUE and BLE  STRENGTH:  Graded on a 0-5 scale Muscle Group Left Right                          Hip Flex 4/5 3/5  Hip Abd -4/5 4/5              Knee Flex 5/5 5/5  Knee Ext 5/5 5/5  Ankle DF 5/5 5/5  Ankle PF 3/5 3/5   SENSATION: B feet numbness   FUNCTIONAL MOBILITY: MI with transfers and supine <> sit with MI   BALANCE: Static Standing Balance  Normal Able to maintain standing balance against maximal resistance   Good Able to maintain standing balance against moderate resistance   Good-/Fair+ Able to maintain standing balance against minimal resistance   Fair Able to stand unsupported without UE support and without LOB for 1-2 min   Fair- Requires Min A and UE support to maintain standing without loss of balance x  Poor+ Requires mod A and UE support to maintain standing without loss of balance   Poor Requires max A and UE support to maintain standing balance without loss    Standing Dynamic Balance  Normal Stand independently unsupported, able to weight shift and cross midline maximally   Good Stand independently unsupported, able to weight shift and cross midline moderately   Good-/Fair+ Stand independently unsupported, able to weight shift across midline minimally   Fair Stand independently unsupported, weight shift, and reach ipsilaterally, loss of balance when crossing midline   Poor+ Able to stand with Min A and reach ipsilaterally, unable to weight shift x  Poor Able to stand with Mod A and minimally reach ipsilaterally, unable to cross midline.       GAIT: Patient  ambulates with rollator with decreased gait speed and flexed trunk posture  OUTCOME MEASURES: TEST Outcome Interpretation  5 times  sit<>stand 25.10sec >60 yo, >15 sec indicates increased risk for falls  10 meter walk test       .33          m/s <1.0 m/s indicates increased risk for falls; limited community ambulator  Timed up and Go       35.82          sec <14 sec indicates increased risk for falls  6 minute walk test     375           Feet 1000 feet is community Water quality scientist Deferred to next visit <36/56 (100% risk for falls), 37-45 (80% risk for falls); 46-51 (>50% risk for falls); 52-55 (lower risk <25% of falls)                  Objective measurements completed on examination: See above findings.              PT Education - 08/15/19 1316    Education Details  plan of care    Person(s) Educated  Patient    Methods  Explanation    Comprehension  Verbalized understanding       PT Short Term Goals - 08/15/19 1615      PT SHORT TERM GOAL #1   Title  Patient will be adherent to HEP at least 3x a week to improve functional strength and balance for better safety at home.    Time  4    Period  Weeks    Status  New    Target Date  09/12/19        PT Long Term Goals - 08/15/19 1609      PT LONG TERM GOAL #1   Title  Patient will increase BLE gross strength to 4+/5 as to improve functional strength for independent gait, increased standing tolerance and increased ADL ability.    Time  8    Period  Weeks    Status  New    Target Date  10/10/19      PT LONG TERM GOAL #2   Title  Patient will increase 10 meter walk test to >1.51m/s as to improve gait speed for better community ambulation and to reduce fall risk.    Baseline  08/15/19=.33 m/sec    Time  8    Period  Weeks    Status  New    Target Date  10/10/19      PT LONG TERM GOAL #3   Title  Patient (> 76 years old) will complete five times sit to stand test in < 15 seconds indicating an increased LE strength and improved balance.    Baseline  08/15/19=  25.20 sec    Time  8    Period  Weeks    Status  New     Target Date  10/10/19      PT LONG TERM GOAL #4   Title  Patient will be mod I when negotiating a curb with LRAD to exhibit improved gait ability and safety in community.     Time  8    Period  Weeks    Status  New    Target Date  10/10/19             Plan - 08/15/19 1327    Clinical Impression Statement  Patient presents with decreased  gait speed, decreased balance, and decreased BLE strength. Patient's main complaint is BLE weakness and inability to participate in desired activities.  Patient will benefit from skilled PT in order to increase gait speed, increase BLE strength, and improve dynamic standing balance to decrease risk for falls and enable patient to participate in desired activities.    Personal Factors and Comorbidities  Age    Examination-Activity Limitations  Bed Mobility    Examination-Participation Restrictions  Driving    Stability/Clinical Decision Making  Evolving/Moderate complexity    Rehab Potential  Fair    Clinical Impairments Affecting Rehab Potential  motivated; concerned about progressive stenosis with increased numbness in legs;     PT Frequency  2x / week    PT Duration  4 weeks    PT Treatment/Interventions  Aquatic Therapy;Cryotherapy;Electrical Stimulation;Moist Heat;Therapeutic exercise;Therapeutic activities;Functional mobility training;Stair training;Gait training;Balance training;Neuromuscular re-education;Patient/family education;Orthotic Fit/Training;Manual techniques;Energy conservation    PT Next Visit Plan  Continue to work on gait activities and LE strengthening    PT [place]  seated marching, hamstring curls, and hip abduction with green tband; trunk rotation and single knee to chest in AM and PM, Standing mini squats; seated heel and toe raises    Consulted and Agree with Plan of Care  Patient       Patient will benefit from skilled therapeutic intervention in order to improve the following deficits and impairments:  Decreased  endurance, Decreased activity tolerance, Decreased strength, Pain, Decreased balance, Decreased mobility, Difficulty walking, Postural dysfunction, Impaired flexibility, Decreased safety awareness  Visit Diagnosis: Other abnormalities of gait and mobility  Muscle weakness (generalized)     Problem List Patient Active Problem List   Diagnosis Date Noted  . Pelvis fracture ([place]) 02/24/2019  . Atrial fibrillation, chronic ([place]) 02/24/2019  . Essential hypertension 02/24/2019  . Coronary artery disease due to lipid rich plaque 02/24/2019  . Acute on chronic systolic CHF (congestive heart failure) ([place]) 02/24/2019  . Aortic stenosis 02/24/2019  . Pelvic fracture ([place]) 02/24/2019  . B12 deficiency 07/01/2018  . Elevated alkaline phosphatase level 06/16/2018  . Macrocytosis 06/16/2018  . Acute ST elevation myocardial infarction (STEMI) of anterolateral wall ([place])   . Angio-edema   . Nonsustained ventricular tachycardia ([place])   . Acute respiratory failure ([place])   . OSA (obstructive sleep apnea)   . Macroglossia   . Non-STEMI (non-ST elevated myocardial infarction) ([place])   . Anaphylaxis 09/30/2016    Alanson Puls, PT DPT 08/15/2019, 4:19 PM  [place] [facility] MAIN [facility] SERVICES [address] [place], [place], [address] Phone: [phone]   Fax:  [phone]  Name: Donald Riley MRN: [id number] Date of Birth: 31-May-1933

## 2019-08-22 ENCOUNTER — Other Ambulatory Visit: Payer: Self-pay

## 2019-08-22 ENCOUNTER — Encounter: Payer: Self-pay | Admitting: Physical Therapy

## 2019-08-22 ENCOUNTER — Ambulatory Visit: Payer: Medicare Other | Admitting: Physical Therapy

## 2019-08-22 DIAGNOSIS — R2689 Other abnormalities of gait and mobility: Secondary | ICD-10-CM | POA: Diagnosis not present

## 2019-08-22 DIAGNOSIS — M6281 Muscle weakness (generalized): Secondary | ICD-10-CM

## 2019-08-22 NOTE — Therapy (Signed)
[place] MAIN [facility] SERVICES [address] [place], [place], [address] Phone: [phone]   Fax:  [phone]  Physical Therapy Treatment  Patient Details  Name: Donald Riley MRN: [id number] Date of Birth: 21-Jan-1934 Referring Provider (PT): Hortencia Pilar   Encounter Date: 08/22/2019  PT End of Session - 08/22/19 1351    Visit Number  2    Number of Visits  17    Date for PT Re-Evaluation  10/03/19    PT Start Time  0145    PT Stop Time  0225    PT Time Calculation (min)  40 min    Equipment Utilized During Treatment  Gait belt    Activity Tolerance  Patient tolerated treatment well    Behavior During Therapy  WFL for tasks assessed/performed       Past Medical History:  Diagnosis Date  . Allergy to alpha-gal   . Aortic stenosis   . Arthritis   . Asthma   . Atrial fibrillation ([place])   . Atrial fibrillation ([place])   . Cancer ([place]) 2002  . CHF (congestive heart failure) ([place])   . GERD (gastroesophageal reflux disease)   . Glaucoma   . Gout   . Hypertension   . Neuropathy   . Osteoporosis   . Sleep apnea     Past Surgical History:  Procedure Laterality Date  . ARTHROPLASTY Right    ankle  . BUNIONECTOMY    . CARDIAC CATHETERIZATION N/A 07/02/2015   Procedure: Right/Left Heart Cath and Coronary Angiography;  Surgeon: Teodoro Spray, MD;  Location: [place] [facility];  Service: Cardiovascular;  Laterality: N/A;  . CORONARY/GRAFT ACUTE MI REVASCULARIZATION N/A 10/02/2016   Procedure: Coronary/Graft Acute MI Revascularization;  Surgeon: Wellington Hampshire, MD;  Location: [place] [facility] LAB;  Service: Cardiovascular;  Laterality: N/A;  . JOINT REPLACEMENT    . ostatectomy      There were no vitals filed for this visit.  Subjective Assessment - 08/22/19 1347    Subjective  Patient has a new rollator with working brakes and a new low back brace that is helping with decreasing pain.    Pertinent History  Patient fell and had a  pelvic fracture in Nov 2011, then he fell in march , had fx ribs and went to [facility] to recover. He has been in bed from being in the nursing home. He had HHPT but still is having weakness in BLE. He has back and hip pain.    Limitations  Other (comment)    How long can you sit comfortably?  unlimited    How long can you stand comfortably?  less than 15 minutes    How long can you walk comfortably?  intermediate distances    Diagnostic tests  MRI shows multiple lumbar segment stenosis/facet degeneration    Patient Stated Goals  improve strength and walking    Currently in Pain?  No/denies    Pain Score  0-No pain    Pain Onset  More than a month ago      Treatment: Nu-step x 5 mins  Treatment: Neuromuscular training: side stepping left and right in parallel bars 10 feet x 3 standing on blue foam with head turns x 1 min  Standing feet together on blue foam with head turns x 1 min step ups from floor to 6 inch stool x 20 bilateral Step ups from blue foam to 6 inch stool x 20 BLE 1/2 foam DF/PF  x 20  1/2 foam balance x 30 sec x 3  Step over 1/2 foam left and right and fwd/bwd x 10  Leg press x 20 x 2 40 lbs  Pt educated throughout session about proper posture and technique with exercises. Improved exercise technique, movement at target joints, use of target muscles after min to mod verbal, visual, tactile cues. CGA and Min to mod verbal cues used throughout with increased in postural sway and LOB most seen with narrow base of support and while on uneven surfaces. Continues to have balance deficits typical with diagnosis. Patient performs intermediate level exercises without pain behaviors and needs verbal cuing for postural alignment and head positioning Tactile cues and assistance needed to keep lower leg and knee in neutral to avoid compensations with ankle motions.                         PT Education - 08/22/19 1351    Education Details  HEP    Person(s)  Educated  Patient    Methods  Explanation    Comprehension  Returned demonstration;Need further instruction       PT Short Term Goals - 08/15/19 1615      PT SHORT TERM GOAL #1   Title  Patient will be adherent to HEP at least 3x a week to improve functional strength and balance for better safety at home.    Time  4    Period  Weeks    Status  New    Target Date  09/12/19        PT Long Term Goals - 08/15/19 1609      PT LONG TERM GOAL #1   Title  Patient will increase BLE gross strength to 4+/5 as to improve functional strength for independent gait, increased standing tolerance and increased ADL ability.    Time  8    Period  Weeks    Status  New    Target Date  10/10/19      PT LONG TERM GOAL #2   Title  Patient will increase 10 meter walk test to >1.71m/s as to improve gait speed for better community ambulation and to reduce fall risk.    Baseline  08/15/19=.33 m/sec    Time  8    Period  Weeks    Status  New    Target Date  10/10/19      PT LONG TERM GOAL #3   Title  Patient (> 9 years old) will complete five times sit to stand test in < 15 seconds indicating an increased LE strength and improved balance.    Baseline  08/15/19=  25.20 sec    Time  8    Period  Weeks    Status  New    Target Date  10/10/19      PT LONG TERM GOAL #4   Title  Patient will be mod I when negotiating a curb with LRAD to exhibit improved gait ability and safety in community.     Time  8    Period  Weeks    Status  New    Target Date  10/10/19            Plan - 08/22/19 1351    Clinical Impression Statement  Pt presents with unsteadiness on uneven surfaces and fatigues with therapeutic exercises.  Patient tolerated all interventions well this date and will benefit from continued skilled PT interventions to improve strength  and balance and decrease risk of falling.   Personal Factors and Comorbidities  Age;Comorbidity 1;Comorbidity 2    Comorbidities  spinal stenosis, back pain,  neuropathy    Examination-Activity Limitations  Bed Mobility    Examination-Participation Restrictions  Driving    Stability/Clinical Decision Making  Evolving/Moderate complexity    Rehab Potential  Fair    Clinical Impairments Affecting Rehab Potential  motivated; concerned about progressive stenosis with increased numbness in legs;     PT Frequency  2x / week    PT Duration  4 weeks    PT Treatment/Interventions  Aquatic Therapy;Cryotherapy;Electrical Stimulation;Moist Heat;Therapeutic exercise;Therapeutic activities;Functional mobility training;Stair training;Gait training;Balance training;Neuromuscular re-education;Patient/family education;Orthotic Fit/Training;Manual techniques;Energy conservation    PT Next Visit Plan  Continue to work on gait activities and LE strengthening    PT [place]  seated marching, hamstring curls, and hip abduction with green tband; trunk rotation and single knee to chest in AM and PM, Standing mini squats; seated heel and toe raises    Consulted and Agree with Plan of Care  Patient       Patient will benefit from skilled therapeutic intervention in order to improve the following deficits and impairments:  Decreased endurance, Decreased activity tolerance, Decreased strength, Pain, Decreased balance, Decreased mobility, Difficulty walking, Postural dysfunction, Impaired flexibility, Decreased safety awareness  Visit Diagnosis: Other abnormalities of gait and mobility  Muscle weakness (generalized)     Problem List Patient Active Problem List   Diagnosis Date Noted  . Pelvis fracture ([place]) 02/24/2019  . Atrial fibrillation, chronic ([place]) 02/24/2019  . Essential hypertension 02/24/2019  . Coronary artery disease due to lipid rich plaque 02/24/2019  . Acute on chronic systolic CHF (congestive heart failure) ([place]) 02/24/2019  . Aortic stenosis 02/24/2019  . Pelvic fracture ([place]) 02/24/2019  . B12 deficiency 07/01/2018  . Elevated alkaline  phosphatase level 06/16/2018  . Macrocytosis 06/16/2018  . Acute ST elevation myocardial infarction (STEMI) of anterolateral wall ([place])   . Angio-edema   . Nonsustained ventricular tachycardia ([place])   . Acute respiratory failure ([place])   . OSA (obstructive sleep apnea)   . Macroglossia   . Non-STEMI (non-ST elevated myocardial infarction) ([place])   . Anaphylaxis 09/30/2016    Alanson Puls, PT DPT 08/22/2019, 1:52 PM  [place] [facility] MAIN [facility] SERVICES [address] [place], [place], [address] Phone: [phone]   Fax:  [phone]  Name: Donald Riley MRN: [id number] Date of Birth: 1933/04/08

## 2019-08-24 ENCOUNTER — Ambulatory Visit: Payer: Medicare Other

## 2019-08-24 ENCOUNTER — Other Ambulatory Visit: Payer: Self-pay

## 2019-08-24 DIAGNOSIS — M6281 Muscle weakness (generalized): Secondary | ICD-10-CM

## 2019-08-24 DIAGNOSIS — R2689 Other abnormalities of gait and mobility: Secondary | ICD-10-CM

## 2019-08-24 NOTE — Therapy (Signed)
[place] MAIN [facility] SERVICES [address] [place], [place], [address] Phone: [phone]   Fax:  [phone]  Physical Therapy Treatment  Patient Details  Name: Donald Riley MRN: [id number] Date of Birth: September 28, 1933 Referring Provider (PT): Hortencia Pilar   Encounter Date: 08/24/2019  PT End of Session - 08/24/19 1357    Visit Number  3    Number of Visits  17    Date for PT Re-Evaluation  10/03/19    PT Start Time  1320    PT Stop Time  1405    PT Time Calculation (min)  45 min    Equipment Utilized During Treatment  Gait belt    Activity Tolerance  Patient tolerated treatment well    Behavior During Therapy  WFL for tasks assessed/performed       Past Medical History:  Diagnosis Date  . Allergy to alpha-gal   . Aortic stenosis   . Arthritis   . Asthma   . Atrial fibrillation ([place])   . Atrial fibrillation ([place])   . Cancer ([place]) 2002  . CHF (congestive heart failure) ([place])   . GERD (gastroesophageal reflux disease)   . Glaucoma   . Gout   . Hypertension   . Neuropathy   . Osteoporosis   . Sleep apnea     Past Surgical History:  Procedure Laterality Date  . ARTHROPLASTY Right    ankle  . BUNIONECTOMY    . CARDIAC CATHETERIZATION N/A 07/02/2015   Procedure: Right/Left Heart Cath and Coronary Angiography;  Surgeon: Teodoro Spray, MD;  Location: [place] [facility];  Service: Cardiovascular;  Laterality: N/A;  . CORONARY/GRAFT ACUTE MI REVASCULARIZATION N/A 10/02/2016   Procedure: Coronary/Graft Acute MI Revascularization;  Surgeon: Wellington Hampshire, MD;  Location: [place] [facility];  Service: Cardiovascular;  Laterality: N/A;  . JOINT REPLACEMENT    . ostatectomy      There were no vitals filed for this visit.  Subjective Assessment - 08/24/19 1357    Subjective  Patient reports that the new rollator and the new back brace are both working well. No falls since last therapy session. No reported pain upon arrival  today.    Pertinent History  Patient fell and had a pelvic fracture in Nov 2011, then he fell in march , had fx ribs and went to [facility] to recover. He has been in bed from being in the nursing home. He had HHPT but still is having weakness in BLE. He has back and hip pain.    Limitations  Other (comment)    How long can you sit comfortably?  unlimited    How long can you stand comfortably?  less than 15 minutes    How long can you walk comfortably?  intermediate distances    Diagnostic tests  MRI shows multiple lumbar segment stenosis/facet degeneration    Patient Stated Goals  improve strength and walking    Currently in Pain?  No/denies         TREATMENT      Ther-ex  Nu-Step L0-2 x 5 mins for warm-up during history (3 minutes unbilled);?  Precor BLE leg press 40#, 2 x 20;?  Seated marches with 2.5# ankle weights (AW) x 20 BLE;  Seated clams with green tband 2s hold x 20 BLE;  Seated adductor ball squeeze 2s hold x 20 BLE;  Seated LAQ with 2.5# AW x 20 BLE;  Seated HS curls with green tband x 20  BLE;     Neuromuscular Re-education  6" alternating step taps without UE support x 10 BLE; 6" forward step-ups with faded 2-1 UE support x 10 BLE; Airex static balance without UE support x 30s; Airex 6" alternating step taps without UE support x 10 each;    Pt educated throughout session about proper posture and technique with exercises. Improved exercise technique, movement at target joints, use of target muscles after min to mod verbal, visual, tactile cues.     Pt presents with unsteadiness on uneven surfaces and fatigues with therapeutic exercises. Provided intermittent rest breaks between exercises. He is able to perform leg press again today and elects not to increase his resistance at this time. Patient performs intermediate level exercises without any pain reported throughout session. Pt encouraged to continue HEP and follow-up as scheduled. Pt will benefit from PT  services to address deficits in strength, balance, and mobility in order to return to full function at home.                          PT Short Term Goals - 08/15/19 1615      PT SHORT TERM GOAL #1   Title  Patient will be adherent to HEP at least 3x a week to improve functional strength and balance for better safety at home.    Time  4    Period  Weeks    Status  New    Target Date  09/12/19        PT Long Term Goals - 08/15/19 1609      PT LONG TERM GOAL #1   Title  Patient will increase BLE gross strength to 4+/5 as to improve functional strength for independent gait, increased standing tolerance and increased ADL ability.    Time  8    Period  Weeks    Status  New    Target Date  10/10/19      PT LONG TERM GOAL #2   Title  Patient will increase 10 meter walk test to >1.28m/s as to improve gait speed for better community ambulation and to reduce fall risk.    Baseline  08/15/19=.33 m/sec    Time  8    Period  Weeks    Status  New    Target Date  10/10/19      PT LONG TERM GOAL #3   Title  Patient (> 50 years old) will complete five times sit to stand test in < 15 seconds indicating an increased LE strength and improved balance.    Baseline  08/15/19=  25.20 sec    Time  8    Period  Weeks    Status  New    Target Date  10/10/19      PT LONG TERM GOAL #4   Title  Patient will be mod I when negotiating a curb with LRAD to exhibit improved gait ability and safety in community.     Time  8    Period  Weeks    Status  New    Target Date  10/10/19            Plan - 08/24/19 1357    Clinical Impression Statement  Pt presents with unsteadiness on uneven surfaces and fatigues with therapeutic exercises. Provided intermittent rest breaks between exercises. He is able to perform leg press again today and elects not to increase his resistance at this time. Patient performs intermediate level  exercises without any pain reported throughout session. Pt  encouraged to continue HEP and follow-up as scheduled. Pt will benefit from PT services to address deficits in strength, balance, and mobility in order to return to full function at home.    Personal Factors and Comorbidities  Age;Comorbidity 1;Comorbidity 2    Comorbidities  spinal stenosis, back pain, neuropathy    Examination-Activity Limitations  Bed Mobility    Examination-Participation Restrictions  Driving    Stability/Clinical Decision Making  Evolving/Moderate complexity    Rehab Potential  Fair    Clinical Impairments Affecting Rehab Potential  motivated; concerned about progressive stenosis with increased numbness in legs;     PT Frequency  2x / week    PT Duration  4 weeks    PT Treatment/Interventions  Aquatic Therapy;Cryotherapy;Electrical Stimulation;Moist Heat;Therapeutic exercise;Therapeutic activities;Functional mobility training;Stair training;Gait training;Balance training;Neuromuscular re-education;Patient/family education;Orthotic Fit/Training;Manual techniques;Energy conservation    PT Next Visit Plan  Continue to work on gait activities and LE strengthening    PT [place]  seated marching, hamstring curls, and hip abduction with green tband; trunk rotation and single knee to chest in AM and PM, Standing mini squats; seated heel and toe raises    Consulted and Agree with Plan of Care  Patient       Patient will benefit from skilled therapeutic intervention in order to improve the following deficits and impairments:  Decreased endurance, Decreased activity tolerance, Decreased strength, Pain, Decreased balance, Decreased mobility, Difficulty walking, Postural dysfunction, Impaired flexibility, Decreased safety awareness  Visit Diagnosis: Other abnormalities of gait and mobility  Muscle weakness (generalized)     Problem List Patient Active Problem List   Diagnosis Date Noted  . Pelvis fracture ([place]) 02/24/2019  . Atrial fibrillation, chronic ([place])  02/24/2019  . Essential hypertension 02/24/2019  . Coronary artery disease due to lipid rich plaque 02/24/2019  . Acute on chronic systolic CHF (congestive heart failure) ([place]) 02/24/2019  . Aortic stenosis 02/24/2019  . Pelvic fracture ([place]) 02/24/2019  . B12 deficiency 07/01/2018  . Elevated alkaline phosphatase level 06/16/2018  . Macrocytosis 06/16/2018  . Acute ST elevation myocardial infarction (STEMI) of anterolateral wall ([place])   . Angio-edema   . Nonsustained ventricular tachycardia ([place])   . Acute respiratory failure ([place])   . OSA (obstructive sleep apnea)   . Macroglossia   . Non-STEMI (non-ST elevated myocardial infarction) ([place])   . Anaphylaxis 09/30/2016  Phillips Grout PT, DPT, GCS   Kwasi Joung 08/24/2019, 2:26 PM  [place] MAIN [facility] SERVICES [address] [place], [place], [address] Phone: [phone]   Fax:  [phone]  Name: Donald Riley MRN: [id number] Date of Birth: May 28, 1933

## 2019-08-29 ENCOUNTER — Ambulatory Visit: Payer: Medicare Other | Attending: Family Medicine

## 2019-08-29 ENCOUNTER — Other Ambulatory Visit: Payer: Self-pay

## 2019-08-29 DIAGNOSIS — M6281 Muscle weakness (generalized): Secondary | ICD-10-CM | POA: Insufficient documentation

## 2019-08-29 DIAGNOSIS — R2689 Other abnormalities of gait and mobility: Secondary | ICD-10-CM | POA: Diagnosis not present

## 2019-08-29 NOTE — Therapy (Signed)
[place] MAIN [facility] SERVICES [address] [place], [place], [address] Phone: [phone]   Fax:  [phone]  Physical Therapy Treatment  Patient Details  Name: Donald Riley MRN: [id number] Date of Birth: [birth year] Referring Provider (PT): Hortencia Pilar   Encounter Date: 08/29/2019  PT End of Session - 08/29/19 1437    Visit Number  4    Number of Visits  17    Date for PT Re-Evaluation  10/03/19    PT Start Time  [id number]    PT Stop Time  1515    PT Time Calculation (min)  44 min    Equipment Utilized During Treatment  Gait belt    Activity Tolerance  Patient tolerated treatment well    Behavior During Therapy  WFL for tasks assessed/performed       Past Medical History:  Diagnosis Date  . Allergy to alpha-gal   . Aortic stenosis   . Arthritis   . Asthma   . Atrial fibrillation ([place])   . Atrial fibrillation ([place])   . Cancer ([place]) 2002  . CHF (congestive heart failure) ([place])   . GERD (gastroesophageal reflux disease)   . Glaucoma   . Gout   . Hypertension   . Neuropathy   . Osteoporosis   . Sleep apnea     Past Surgical History:  Procedure Laterality Date  . ARTHROPLASTY Right    ankle  . BUNIONECTOMY    . CARDIAC CATHETERIZATION N/A 07/02/2015   Procedure: Right/Left Heart Cath and Coronary Angiography;  Surgeon: Teodoro Spray, MD;  Location: [place] [facility];  Service: Cardiovascular;  Laterality: N/A;  . CORONARY/GRAFT ACUTE MI REVASCULARIZATION N/A 10/02/2016   Procedure: Coronary/Graft Acute MI Revascularization;  Surgeon: Wellington Hampshire, MD;  Location: [place] [facility];  Service: Cardiovascular;  Laterality: N/A;  . JOINT REPLACEMENT    . ostatectomy      There were no vitals filed for this visit.  Subjective Assessment - 08/29/19 1435    Subjective  Patient arrives without the back brace today. He reports no additional back pain but states that it is "stiff." No falls since last therapy session. No other  reported pain upon arrival today.    Pertinent History  Patient fell and had a pelvic fracture in Nov 2011, then he fell in march , had fx ribs and went to [facility] to recover. He has been in bed from being in the nursing home. He had HHPT but still is having weakness in BLE. He has back and hip pain.    Limitations  Other (comment)    How long can you sit comfortably?  unlimited    How long can you stand comfortably?  less than 15 minutes    How long can you walk comfortably?  intermediate distances    Diagnostic tests  MRI shows multiple lumbar segment stenosis/facet degeneration    Patient Stated Goals  improve strength and walking    Currently in Pain?  No/denies          TREATMENT      Ther-ex  Nu-Step L2-4 x 5 mins for warm-up during history with therapist adjusting resistance throughout to change intensity (3 minutes unbilled);?  Precor BLE leg press 40#, 2 x 20;?  Seated marches with 3# ankle weights (AW) x 20 BLE;  Seated clams with green tband 2s hold x 20 BLE;  Seated adductor ball squeeze 2s hold x 20 BLE;  Seated LAQ with  3# AW x 20 BLE;  Seated HS curls with green tband x 20 BLE;  Sit to stand from regular height chair with Airex pad on seat 2 x 10; Standing marches x 10; Standing heel raises with BUE support x 10; Standing hip abduction with BUE support and 3# AW x 10 BLE;    Neuromuscular Re-education  6" alternating step taps without UE support x 10 BLE; Airex static balance without UE support x 30s; Airex dart board toss without UE support to challenge dynamic balance x multiple bouts    Pt educated throughout session about proper posture and technique with exercises. Improved exercise technique, movement at target joints, use of target muscles after min to mod verbal, visual, tactile cues.     Pt presents with unsteadiness on uneven surfaces and fatigues with therapeutic exercises. Provided intermittent rest breaks between exercises. He is able to  progress his standing exercises today as well as his balance exercises. Introduced dartboard toss on Airex pad into session today. Pt encouraged to continue HEP and follow-up as scheduled. Pt will benefit from PT services to address deficits in strength, balance, and mobility in order to return to full function at home.                             PT Short Term Goals - 08/15/19 1615      PT SHORT TERM GOAL #1   Title  Patient will be adherent to HEP at least 3x a week to improve functional strength and balance for better safety at home.    Time  4    Period  Weeks    Status  New    Target Date  09/12/19        PT Long Term Goals - 08/15/19 1609      PT LONG TERM GOAL #1   Title  Patient will increase BLE gross strength to 4+/5 as to improve functional strength for independent gait, increased standing tolerance and increased ADL ability.    Time  8    Period  Weeks    Status  New    Target Date  10/10/19      PT LONG TERM GOAL #2   Title  Patient will increase 10 meter walk test to >1.93m/s as to improve gait speed for better community ambulation and to reduce fall risk.    Baseline  08/15/19=.33 m/sec    Time  8    Period  Weeks    Status  New    Target Date  10/10/19      PT LONG TERM GOAL #3   Title  Patient (> 18 years old) will complete five times sit to stand test in < 15 seconds indicating an increased LE strength and improved balance.    Baseline  08/15/19=  25.20 sec    Time  8    Period  Weeks    Status  New    Target Date  10/10/19      PT LONG TERM GOAL #4   Title  Patient will be mod I when negotiating a curb with LRAD to exhibit improved gait ability and safety in community.     Time  8    Period  Weeks    Status  New    Target Date  10/10/19            Plan - 08/29/19 1437    Clinical Impression Statement  Pt presents with unsteadiness on uneven surfaces and fatigues with therapeutic exercises. Provided intermittent rest  breaks between exercises. He is able to progress his standing exercises today as well as his balance exercises. Introduced dartboard toss on Airex pad into session today. Pt encouraged to continue HEP and follow-up as scheduled. Pt will benefit from PT services to address deficits in strength, balance, and mobility in order to return to full function at home.    Personal Factors and Comorbidities  Age;Comorbidity 1;Comorbidity 2    Comorbidities  spinal stenosis, back pain, neuropathy    Examination-Activity Limitations  Bed Mobility    Examination-Participation Restrictions  Driving    Stability/Clinical Decision Making  Evolving/Moderate complexity    Rehab Potential  Fair    Clinical Impairments Affecting Rehab Potential  motivated; concerned about progressive stenosis with increased numbness in legs;     PT Frequency  2x / week    PT Duration  4 weeks    PT Treatment/Interventions  Aquatic Therapy;Cryotherapy;Electrical Stimulation;Moist Heat;Therapeutic exercise;Therapeutic activities;Functional mobility training;Stair training;Gait training;Balance training;Neuromuscular re-education;Patient/family education;Orthotic Fit/Training;Manual techniques;Energy conservation    PT Next Visit Plan  Continue to work on gait activities and LE strengthening    PT [place]  seated marching, hamstring curls, and hip abduction with green tband; trunk rotation and single knee to chest in AM and PM, Standing mini squats; seated heel and toe raises    Consulted and Agree with Plan of Care  Patient       Patient will benefit from skilled therapeutic intervention in order to improve the following deficits and impairments:  Decreased endurance, Decreased activity tolerance, Decreased strength, Pain, Decreased balance, Decreased mobility, Difficulty walking, Postural dysfunction, Impaired flexibility, Decreased safety awareness  Visit Diagnosis: Other abnormalities of gait and mobility  Muscle  weakness (generalized)     Problem List Patient Active Problem List   Diagnosis Date Noted  . Pelvis fracture ([place]) 02/24/2019  . Atrial fibrillation, chronic ([place]) 02/24/2019  . Essential hypertension 02/24/2019  . Coronary artery disease due to lipid rich plaque 02/24/2019  . Acute on chronic systolic CHF (congestive heart failure) ([place]) 02/24/2019  . Aortic stenosis 02/24/2019  . Pelvic fracture ([place]) 02/24/2019  . B12 deficiency 07/01/2018  . Elevated alkaline phosphatase level 06/16/2018  . Macrocytosis 06/16/2018  . Acute ST elevation myocardial infarction (STEMI) of anterolateral wall ([place])   . Angio-edema   . Nonsustained ventricular tachycardia ([place])   . Acute respiratory failure ([place])   . OSA (obstructive sleep apnea)   . Macroglossia   . Non-STEMI (non-ST elevated myocardial infarction) ([place])   . Anaphylaxis 09/30/2016   Phillips Grout PT, DPT, GCS  Mischell Branford 08/29/2019, 4:34 PM  [place] MAIN [facility] SERVICES [address] [place], [place], [address] Phone: [phone]   Fax:  [phone]  Name: Donald Riley MRN: [id number] Date of Birth: 1933-07-07

## 2019-08-31 ENCOUNTER — Ambulatory Visit: Payer: Medicare Other

## 2019-08-31 ENCOUNTER — Other Ambulatory Visit: Payer: Self-pay

## 2019-08-31 DIAGNOSIS — R2689 Other abnormalities of gait and mobility: Secondary | ICD-10-CM | POA: Diagnosis not present

## 2019-08-31 DIAGNOSIS — M6281 Muscle weakness (generalized): Secondary | ICD-10-CM

## 2019-08-31 NOTE — Therapy (Signed)
[place] MAIN [facility] SERVICES [address] [place], [place], [address] Phone: [phone]   Fax:  [phone]  Physical Therapy Treatment  Patient Details  Name: Donald Riley MRN: [id number] Date of Birth: 10-21-1933 Referring Provider (PT): Hortencia Pilar   Encounter Date: 08/31/2019  PT End of Session - 08/31/19 1149    Visit Number  5    Number of Visits  17    Date for PT Re-Evaluation  10/03/19    PT Start Time  [id number]    PT Stop Time  1230    PT Time Calculation (min)  45 min    Equipment Utilized During Treatment  Gait belt    Activity Tolerance  Patient tolerated treatment well    Behavior During Therapy  WFL for tasks assessed/performed       Past Medical History:  Diagnosis Date  . Allergy to alpha-gal   . Aortic stenosis   . Arthritis   . Asthma   . Atrial fibrillation ([place])   . Atrial fibrillation ([place])   . Cancer ([place]) 2002  . CHF (congestive heart failure) ([place])   . GERD (gastroesophageal reflux disease)   . Glaucoma   . Gout   . Hypertension   . Neuropathy   . Osteoporosis   . Sleep apnea     Past Surgical History:  Procedure Laterality Date  . ARTHROPLASTY Right    ankle  . BUNIONECTOMY    . CARDIAC CATHETERIZATION N/A 07/02/2015   Procedure: Right/Left Heart Cath and Coronary Angiography;  Surgeon: Teodoro Spray, MD;  Location: [place] [facility];  Service: Cardiovascular;  Laterality: N/A;  . CORONARY/GRAFT ACUTE MI REVASCULARIZATION N/A 10/02/2016   Procedure: Coronary/Graft Acute MI Revascularization;  Surgeon: Wellington Hampshire, MD;  Location: [place] [facility];  Service: Cardiovascular;  Laterality: N/A;  . JOINT REPLACEMENT    . ostatectomy      There were no vitals filed for this visit.  Subjective Assessment - 08/31/19 1149    Subjective  Patient arrives without the back brace again today. He reports no back pain or stiffness upon arrival. No falls since last therapy session. No medication or  health changes. No other reported pain upon arrival today.    Pertinent History  Patient fell and had a pelvic fracture in Nov 2011, then he fell in march , had fx ribs and went to [facility] to recover. He has been in bed from being in the nursing home. He had HHPT but still is having weakness in BLE. He has back and hip pain.    Limitations  Other (comment)    How long can you sit comfortably?  unlimited    How long can you stand comfortably?  less than 15 minutes    How long can you walk comfortably?  intermediate distances    Diagnostic tests  MRI shows multiple lumbar segment stenosis/facet degeneration    Patient Stated Goals  improve strength and walking    Currently in Pain?  No/denies           TREATMENT     Ther-ex  Nu-Step L2-4 x 5 mins for warm-up during history with therapist adjusting resistance throughout to change intensity (3 minutes unbilled);?  Precor BLE leg press 55#, 2 x 20;? Sit to stand from regular height chair with Airex pad on seat x 10, x 5;  Seated marches with 4# ankle weights (AW) x 20 BLE;  Seated clams with green tband  2s hold x 20 BLE;  Seated adductor ball squeeze 2s hold x 20 BLE;  Seated LAQ with 4# AW x 20 BLE;   Standing marches toe taps with 4# AW x 10 BLE;; Standing heel raises with BUE support x 10; Standing hip abduction with BUE support and 4# AW x 10 BLE;  Alternating 6" step-ups with 4# AW and SUE support x 10 BLE;    Neuromuscular Re-education  Airex 6" alternating step taps without UE support x 10 BLE; Airex static balance with forward foot on 6" step and no UE support x 30s each; Airex NBOS horizontal and vertical head turns x 30s each;    Pt educated throughout session about proper posture and technique with exercises. Improved exercise technique, movement at target joints, use of target muscles after min to mod verbal, visual, tactile cues.     Pt presents with unsteadiness on uneven surfaces and fatigues with  therapeutic exercises. Provided intermittent rest breaks between exercises. He is able to progress his standing exercises today as well as his balance exercises. Increased resistance on ankle weights as well as with the leg press. Continued with balance exercises on Airex pad. Pt encouraged to continue HEP and follow-up as scheduled. Pt will benefit from PT services to address deficits in strength, balance, and mobility in order to return to full function at home.                          PT Short Term Goals - 08/15/19 1615      PT SHORT TERM GOAL #1   Title  Patient will be adherent to HEP at least 3x a week to improve functional strength and balance for better safety at home.    Time  4    Period  Weeks    Status  New    Target Date  09/12/19        PT Long Term Goals - 08/15/19 1609      PT LONG TERM GOAL #1   Title  Patient will increase BLE gross strength to 4+/5 as to improve functional strength for independent gait, increased standing tolerance and increased ADL ability.    Time  8    Period  Weeks    Status  New    Target Date  10/10/19      PT LONG TERM GOAL #2   Title  Patient will increase 10 meter walk test to >1.57m/s as to improve gait speed for better community ambulation and to reduce fall risk.    Baseline  08/15/19=.33 m/sec    Time  8    Period  Weeks    Status  New    Target Date  10/10/19      PT LONG TERM GOAL #3   Title  Patient (> 57 years old) will complete five times sit to stand test in < 15 seconds indicating an increased LE strength and improved balance.    Baseline  08/15/19=  25.20 sec    Time  8    Period  Weeks    Status  New    Target Date  10/10/19      PT LONG TERM GOAL #4   Title  Patient will be mod I when negotiating a curb with LRAD to exhibit improved gait ability and safety in community.     Time  8    Period  Weeks    Status  New  Target Date  10/10/19            Plan - 08/31/19 1149    Clinical  Impression Statement  Pt presents with unsteadiness on uneven surfaces and fatigues with therapeutic exercises. Provided intermittent rest breaks between exercises. He is able to progress his standing exercises today as well as his balance exercises. Increased resistance on ankle weights as well as with the leg press. Continued with balance exercises on Airex pad. Pt encouraged to continue HEP and follow-up as scheduled. Pt will benefit from PT services to address deficits in strength, balance, and mobility in order to return to full function at home.    Personal Factors and Comorbidities  Age;Comorbidity 1;Comorbidity 2    Comorbidities  spinal stenosis, back pain, neuropathy    Examination-Activity Limitations  Bed Mobility    Examination-Participation Restrictions  Driving    Stability/Clinical Decision Making  Evolving/Moderate complexity    Rehab Potential  Fair    Clinical Impairments Affecting Rehab Potential  motivated; concerned about progressive stenosis with increased numbness in legs;     PT Frequency  2x / week    PT Duration  4 weeks    PT Treatment/Interventions  Aquatic Therapy;Cryotherapy;Electrical Stimulation;Moist Heat;Therapeutic exercise;Therapeutic activities;Functional mobility training;Stair training;Gait training;Balance training;Neuromuscular re-education;Patient/family education;Orthotic Fit/Training;Manual techniques;Energy conservation    PT Next Visit Plan  Continue to work on gait activities and LE strengthening    PT [place]  seated marching, hamstring curls, and hip abduction with green tband; trunk rotation and single knee to chest in AM and PM, Standing mini squats; seated heel and toe raises    Consulted and Agree with Plan of Care  Patient       Patient will benefit from skilled therapeutic intervention in order to improve the following deficits and impairments:  Decreased endurance, Decreased activity tolerance, Decreased strength, Pain, Decreased  balance, Decreased mobility, Difficulty walking, Postural dysfunction, Impaired flexibility, Decreased safety awareness  Visit Diagnosis: Other abnormalities of gait and mobility  Muscle weakness (generalized)     Problem List Patient Active Problem List   Diagnosis Date Noted  . Pelvis fracture ([place]) 02/24/2019  . Atrial fibrillation, chronic ([place]) 02/24/2019  . Essential hypertension 02/24/2019  . Coronary artery disease due to lipid rich plaque 02/24/2019  . Acute on chronic systolic CHF (congestive heart failure) ([place]) 02/24/2019  . Aortic stenosis 02/24/2019  . Pelvic fracture ([place]) 02/24/2019  . B12 deficiency 07/01/2018  . Elevated alkaline phosphatase level 06/16/2018  . Macrocytosis 06/16/2018  . Acute ST elevation myocardial infarction (STEMI) of anterolateral wall ([place])   . Angio-edema   . Nonsustained ventricular tachycardia ([place])   . Acute respiratory failure ([place])   . OSA (obstructive sleep apnea)   . Macroglossia   . Non-STEMI (non-ST elevated myocardial infarction) ([place])   . Anaphylaxis 09/30/2016   Phillips Grout PT, DPT, GCS  Deeandra Jerry 08/31/2019, 3:09 PM  [place] MAIN [facility] SERVICES [address] [place], [place], [address] Phone: [phone]   Fax:  [phone]  Name: Donald Riley MRN: [id number] Date of Birth: November 27, 1933

## 2019-09-05 ENCOUNTER — Other Ambulatory Visit: Payer: Self-pay

## 2019-09-05 ENCOUNTER — Ambulatory Visit: Payer: Medicare Other

## 2019-09-05 DIAGNOSIS — R2689 Other abnormalities of gait and mobility: Secondary | ICD-10-CM

## 2019-09-05 DIAGNOSIS — M6281 Muscle weakness (generalized): Secondary | ICD-10-CM

## 2019-09-05 NOTE — Therapy (Signed)
[place] MAIN [facility] SERVICES [address] [place], [place], [address] Phone: [phone]   Fax:  [phone]  Physical Therapy Treatment  Patient Details  Name: Donald Riley MRN: [id number] Date of Birth: 1934/01/14 Referring Provider (PT): Hortencia Pilar   Encounter Date: 09/05/2019  PT End of Session - 09/05/19 0939    Visit Number  6    Number of Visits  17    Date for PT Re-Evaluation  10/03/19    PT Start Time  0930    PT Stop Time  1015    PT Time Calculation (min)  45 min    Equipment Utilized During Treatment  Gait belt    Activity Tolerance  Patient tolerated treatment well    Behavior During Therapy  WFL for tasks assessed/performed       Past Medical History:  Diagnosis Date  . Allergy to alpha-gal   . Aortic stenosis   . Arthritis   . Asthma   . Atrial fibrillation ([place])   . Atrial fibrillation ([place])   . Cancer ([place]) 2002  . CHF (congestive heart failure) ([place])   . GERD (gastroesophageal reflux disease)   . Glaucoma   . Gout   . Hypertension   . Neuropathy   . Osteoporosis   . Sleep apnea     Past Surgical History:  Procedure Laterality Date  . ARTHROPLASTY Right    ankle  . BUNIONECTOMY    . CARDIAC CATHETERIZATION N/A 07/02/2015   Procedure: Right/Left Heart Cath and Coronary Angiography;  Surgeon: Teodoro Spray, MD;  Location: [place] [facility];  Service: Cardiovascular;  Laterality: N/A;  . CORONARY/GRAFT ACUTE MI REVASCULARIZATION N/A 10/02/2016   Procedure: Coronary/Graft Acute MI Revascularization;  Surgeon: Wellington Hampshire, MD;  Location: [place] [facility];  Service: Cardiovascular;  Laterality: N/A;  . JOINT REPLACEMENT    . ostatectomy      There were no vitals filed for this visit.  Subjective Assessment - 09/05/19 0937    Subjective  Pt arrives reporting that he is doing well today. He denies any back pain or stiffness upon arrival. No stumbles or falls since last therapy session. No  medication or health changes. No other reported pain upon arrival today.    Pertinent History  Patient fell and had a pelvic fracture in Nov 2011, then he fell in march , had fx ribs and went to [facility] to recover. He has been in bed from being in the nursing home. He had HHPT but still is having weakness in BLE. He has back and hip pain.    Limitations  Other (comment)    How long can you sit comfortably?  unlimited    How long can you stand comfortably?  less than 15 minutes    How long can you walk comfortably?  intermediate distances    Diagnostic tests  MRI shows multiple lumbar segment stenosis/facet degeneration    Patient Stated Goals  improve strength and walking    Currently in Pain?  No/denies         TREATMENT    Ther-ex Nu-Step L2-4x 5 mins for warm-up during history with therapist adjusting resistance throughout to change intensity(3 minutes unbilled);?  Precor BLE leg press 55# x 30, 70# x 20; Forward Matrix resisted gait 12.5# x 5; Sit to stand from regular height chair with Airex pad on seat x 10, Airex pad removed and performed 3 reps however pt requires minA+1; Seated LAQ  with5# ankle weight (AW) x 20 BLE;  Standing heel raises with BUE support x 20; Standing hip abduction with BUE support and 5# AW x 20 BLE;   Neuromuscular Re-education 1/2 foam roll step-overs x 10 with LLE and x 5 with RLE; 6" alternating step taps without UE support x 10 BLE; 4 square stepping CW/CCW x 2 each direction without UE support;   Pt educated throughout session about proper posture and technique with exercises. Improved exercise technique, movement at target joints, use of target muscles after min to mod verbal, visual, tactile cues.    Pt presents with improved strength and endurance today. He requires less seated rest breaks between exercises and he is able to increase the resistance on the leg press today. He is able to progress his standing exercises today as  well as his balance exercises. Increased resistance on ankle weights. Pt encouraged to continue HEP and follow-up as scheduled. Pt will benefit from PT services to address deficits in strength, balance, and mobility in order to return to full function at home.                           PT Short Term Goals - 08/15/19 1615      PT SHORT TERM GOAL #1   Title  Patient will be adherent to HEP at least 3x a week to improve functional strength and balance for better safety at home.    Time  4    Period  Weeks    Status  New    Target Date  09/12/19        PT Long Term Goals - 08/15/19 1609      PT LONG TERM GOAL #1   Title  Patient will increase BLE gross strength to 4+/5 as to improve functional strength for independent gait, increased standing tolerance and increased ADL ability.    Time  8    Period  Weeks    Status  New    Target Date  10/10/19      PT LONG TERM GOAL #2   Title  Patient will increase 10 meter walk test to >1.48m/s as to improve gait speed for better community ambulation and to reduce fall risk.    Baseline  08/15/19=.33 m/sec    Time  8    Period  Weeks    Status  New    Target Date  10/10/19      PT LONG TERM GOAL #3   Title  Patient (> 84 years old) will complete five times sit to stand test in < 15 seconds indicating an increased LE strength and improved balance.    Baseline  08/15/19=  25.20 sec    Time  8    Period  Weeks    Status  New    Target Date  10/10/19      PT LONG TERM GOAL #4   Title  Patient will be mod I when negotiating a curb with LRAD to exhibit improved gait ability and safety in community.     Time  8    Period  Weeks    Status  New    Target Date  10/10/19            Plan - 09/05/19 0939    Clinical Impression Statement  Pt presents with improved strength and endurance today. He requires less seated rest breaks between exercises and he is able to increase the  resistance on the leg press today. He is able  to progress his standing exercises today as well as his balance exercises. Increased resistance on ankle weights. Pt encouraged to continue HEP and follow-up as scheduled. Pt will benefit from PT services to address deficits in strength, balance, and mobility in order to return to full function at home.    Personal Factors and Comorbidities  Age;Comorbidity 1;Comorbidity 2    Comorbidities  spinal stenosis, back pain, neuropathy    Examination-Activity Limitations  Bed Mobility    Examination-Participation Restrictions  Driving    Stability/Clinical Decision Making  Evolving/Moderate complexity    Rehab Potential  Fair    Clinical Impairments Affecting Rehab Potential  motivated; concerned about progressive stenosis with increased numbness in legs;     PT Frequency  2x / week    PT Duration  4 weeks    PT Treatment/Interventions  Aquatic Therapy;Cryotherapy;Electrical Stimulation;Moist Heat;Therapeutic exercise;Therapeutic activities;Functional mobility training;Stair training;Gait training;Balance training;Neuromuscular re-education;Patient/family education;Orthotic Fit/Training;Manual techniques;Energy conservation    PT Next Visit Plan  Continue to work on gait activities and LE strengthening    PT [place]  seated marching, hamstring curls, and hip abduction with green tband; trunk rotation and single knee to chest in AM and PM, Standing mini squats; seated heel and toe raises    Consulted and Agree with Plan of Care  Patient       Patient will benefit from skilled therapeutic intervention in order to improve the following deficits and impairments:  Decreased endurance, Decreased activity tolerance, Decreased strength, Pain, Decreased balance, Decreased mobility, Difficulty walking, Postural dysfunction, Impaired flexibility, Decreased safety awareness  Visit Diagnosis: Other abnormalities of gait and mobility  Muscle weakness (generalized)     Problem List Patient Active  Problem List   Diagnosis Date Noted  . Pelvis fracture ([place]) 02/24/2019  . Atrial fibrillation, chronic ([place]) 02/24/2019  . Essential hypertension 02/24/2019  . Coronary artery disease due to lipid rich plaque 02/24/2019  . Acute on chronic systolic CHF (congestive heart failure) ([place]) 02/24/2019  . Aortic stenosis 02/24/2019  . Pelvic fracture ([place]) 02/24/2019  . B12 deficiency 07/01/2018  . Elevated alkaline phosphatase level 06/16/2018  . Macrocytosis 06/16/2018  . Acute ST elevation myocardial infarction (STEMI) of anterolateral wall ([place])   . Angio-edema   . Nonsustained ventricular tachycardia ([place])   . Acute respiratory failure ([place])   . OSA (obstructive sleep apnea)   . Macroglossia   . Non-STEMI (non-ST elevated myocardial infarction) ([place])   . Anaphylaxis 09/30/2016   Phillips Grout PT, DPT, GCS  Derrill Bagnell 09/05/2019, 10:34 AM  [place] MAIN [facility] SERVICES [address] [place], [place], [address] Phone: [phone]   Fax:  [phone]  Name: JETTSON CRABLE MRN: [id number] Date of Birth: 26-Jul-1933

## 2019-09-07 ENCOUNTER — Other Ambulatory Visit: Payer: Self-pay

## 2019-09-07 ENCOUNTER — Ambulatory Visit: Payer: Medicare Other

## 2019-09-07 DIAGNOSIS — R2689 Other abnormalities of gait and mobility: Secondary | ICD-10-CM | POA: Diagnosis not present

## 2019-09-07 DIAGNOSIS — M6281 Muscle weakness (generalized): Secondary | ICD-10-CM

## 2019-09-07 NOTE — Therapy (Signed)
[place] MAIN [facility] SERVICES [address] [place], [place], [address] Phone: [phone]   Fax:  [phone]  Physical Therapy Treatment  Patient Details  Name: Donald Riley MRN: [id number] Date of Birth: 1934/01/30 Referring Provider (PT): Hortencia Pilar   Encounter Date: 09/07/2019   PT End of Session - 09/07/19 0940    Visit Number 7    Number of Visits 17    Date for PT Re-Evaluation 10/03/19    PT Start Time 0935    PT Stop Time 1015    PT Time Calculation (min) 40 min    Equipment Utilized During Treatment Gait belt    Activity Tolerance Patient tolerated treatment well    Behavior During Therapy WFL for tasks assessed/performed           Past Medical History:  Diagnosis Date  . Allergy to alpha-gal   . Aortic stenosis   . Arthritis   . Asthma   . Atrial fibrillation ([place])   . Atrial fibrillation ([place])   . Cancer ([place]) 2002  . CHF (congestive heart failure) ([place])   . GERD (gastroesophageal reflux disease)   . Glaucoma   . Gout   . Hypertension   . Neuropathy   . Osteoporosis   . Sleep apnea     Past Surgical History:  Procedure Laterality Date  . ARTHROPLASTY Right    ankle  . BUNIONECTOMY    . CARDIAC CATHETERIZATION N/A 07/02/2015   Procedure: Right/Left Heart Cath and Coronary Angiography;  Surgeon: Teodoro Spray, MD;  Location: [place] [facility];  Service: Cardiovascular;  Laterality: N/A;  . CORONARY/GRAFT ACUTE MI REVASCULARIZATION N/A 10/02/2016   Procedure: Coronary/Graft Acute MI Revascularization;  Surgeon: Wellington Hampshire, MD;  Location: [place] [facility];  Service: Cardiovascular;  Laterality: N/A;  . JOINT REPLACEMENT    . ostatectomy      There were no vitals filed for this visit.   Subjective Assessment - 09/07/19 0940    Subjective Pt arrives reporting that he is doing well today. He denies any back pain or stiffness upon arrival. No stumbles or falls since last therapy session. No  medication or health changes. No other reported pain upon arrival today.    Pertinent History Patient fell and had a pelvic fracture in Nov 2011, then he fell in march , had fx ribs and went to [facility] to recover. He has been in bed from being in the nursing home. He had HHPT but still is having weakness in BLE. He has back and hip pain.    Limitations Other (comment)    How long can you sit comfortably? unlimited    How long can you stand comfortably? less than 15 minutes    How long can you walk comfortably? intermediate distances    Diagnostic tests MRI shows multiple lumbar segment stenosis/facet degeneration    Patient Stated Goals improve strength and walking    Currently in Pain? No/denies              TREATMENT    Ther-ex Nu-Step L2-4x 4 mins for warm-up during history with therapist adjusting resistance throughout to change intensity(2 minutes unbilled);?  Precor BLE leg press 70# 2 x 20; Sit to stand from regular height chair with Airex pad on seat 2 x 10; Seated marches with 5# ankle weight (AW) x 20 BLE; Seated LAQ with5# AW x 20 BLE;  Seated clams with green tband around knees x 20 BLE;  Seated adductor squeeze with ball between knees x 20 BLE; Standing hip abduction with BUE support and 5# AW x 20 BLE; Stanidng hip flexion marches with 5# AW x 20 BLE; Standing mini squats 2 x 10;   Pt educated throughout session about proper posture and technique with exercises. Improved exercise technique, movement at target joints, use of target muscles after min to mod verbal, visual, tactile cues.    Pt presents with good motivation today. Session focused on strengthening today. He requires intermittent seated rest breaks between exercises. He is able to perform two sets of leg press at 70 pounds today. He is able to progress his standing exercises today as well. He continues to struggle to perform sit to stand from regular height chair without UE support. Pt  encouraged to continue HEP and follow-up as scheduled. Pt will benefit from PT services to address deficits in strength, balance, and mobility in order to return to full function at home.                                        PT Short Term Goals - 08/15/19 1615      PT SHORT TERM GOAL #1   Title Patient will be adherent to HEP at least 3x a week to improve functional strength and balance for better safety at home.    Time 4    Period Weeks    Status New    Target Date 09/12/19             PT Long Term Goals - 08/15/19 1609      PT LONG TERM GOAL #1   Title Patient will increase BLE gross strength to 4+/5 as to improve functional strength for independent gait, increased standing tolerance and increased ADL ability.    Time 8    Period Weeks    Status New    Target Date 10/10/19      PT LONG TERM GOAL #2   Title Patient will increase 10 meter walk test to >1.61m/s as to improve gait speed for better community ambulation and to reduce fall risk.    Baseline 08/15/19=.33 m/sec    Time 8    Period Weeks    Status New    Target Date 10/10/19      PT LONG TERM GOAL #3   Title Patient (> 41 years old) will complete five times sit to stand test in < 15 seconds indicating an increased LE strength and improved balance.    Baseline 08/15/19=  25.20 sec    Time 8    Period Weeks    Status New    Target Date 10/10/19      PT LONG TERM GOAL #4   Title Patient will be mod I when negotiating a curb with LRAD to exhibit improved gait ability and safety in community.     Time 8    Period Weeks    Status New    Target Date 10/10/19                 Plan - 09/07/19 0941    Clinical Impression Statement Pt presents with good motivation today. Session focused on strengthening today. He requires intermittent seated rest breaks between exercises. He is able to perform two sets of leg press at 70 pounds today. He is able to progress his standing  exercises today as well.  He continues to struggle to perform sit to stand from regular height chair without UE support. Pt encouraged to continue HEP and follow-up as scheduled. Pt will benefit from PT services to address deficits in strength, balance, and mobility in order to return to full function at home.    Personal Factors and Comorbidities Age;Comorbidity 1;Comorbidity 2    Comorbidities spinal stenosis, back pain, neuropathy    Examination-Activity Limitations Bed Mobility    Examination-Participation Restrictions Driving    Stability/Clinical Decision Making Evolving/Moderate complexity    Rehab Potential Fair    Clinical Impairments Affecting Rehab Potential motivated; concerned about progressive stenosis with increased numbness in legs;     PT Frequency 2x / week    PT Duration 4 weeks    PT Treatment/Interventions Aquatic Therapy;Cryotherapy;Electrical Stimulation;Moist Heat;Therapeutic exercise;Therapeutic activities;Functional mobility training;Stair training;Gait training;Balance training;Neuromuscular re-education;Patient/family education;Orthotic Fit/Training;Manual techniques;Energy conservation    PT Next Visit Plan Continue to work on gait activities and LE strengthening    PT [place] seated marching, hamstring curls, and hip abduction with green tband; trunk rotation and single knee to chest in AM and PM, Standing mini squats; seated heel and toe raises    Consulted and Agree with Plan of Care Patient           Patient will benefit from skilled therapeutic intervention in order to improve the following deficits and impairments:  Decreased endurance, Decreased activity tolerance, Decreased strength, Pain, Decreased balance, Decreased mobility, Difficulty walking, Postural dysfunction, Impaired flexibility, Decreased safety awareness  Visit Diagnosis: Other abnormalities of gait and mobility  Muscle weakness (generalized)     Problem List Patient Active  Problem List   Diagnosis Date Noted  . Pelvis fracture ([place]) 02/24/2019  . Atrial fibrillation, chronic ([place]) 02/24/2019  . Essential hypertension 02/24/2019  . Coronary artery disease due to lipid rich plaque 02/24/2019  . Acute on chronic systolic CHF (congestive heart failure) ([place]) 02/24/2019  . Aortic stenosis 02/24/2019  . Pelvic fracture ([place]) 02/24/2019  . B12 deficiency 07/01/2018  . Elevated alkaline phosphatase level 06/16/2018  . Macrocytosis 06/16/2018  . Acute ST elevation myocardial infarction (STEMI) of anterolateral wall ([place])   . Angio-edema   . Nonsustained ventricular tachycardia ([place])   . Acute respiratory failure ([place])   . OSA (obstructive sleep apnea)   . Macroglossia   . Non-STEMI (non-ST elevated myocardial infarction) ([place])   . Anaphylaxis 09/30/2016   Phillips Grout PT, DPT, GCS  Ulric Salzman 09/07/2019, 11:22 AM  [place] MAIN [facility] SERVICES [address] [place], [place], [address] Phone: [phone]   Fax:  [phone]  Name: Donald Riley MRN: [id number] Date of Birth: [birth year]

## 2019-09-11 ENCOUNTER — Encounter: Payer: Self-pay | Admitting: Physical Therapy

## 2019-09-11 ENCOUNTER — Ambulatory Visit: Payer: Medicare Other | Admitting: Physical Therapy

## 2019-09-11 ENCOUNTER — Other Ambulatory Visit: Payer: Self-pay

## 2019-09-11 DIAGNOSIS — R2689 Other abnormalities of gait and mobility: Secondary | ICD-10-CM

## 2019-09-11 DIAGNOSIS — M6281 Muscle weakness (generalized): Secondary | ICD-10-CM

## 2019-09-11 NOTE — Therapy (Signed)
[place] MAIN [facility] SERVICES [address] [place], [place], [address] Phone: [phone]   Fax:  [phone]  Physical Therapy Treatment  Patient Details  Name: Donald Riley MRN: [id number] Date of Birth: Mar 10, 1934 Referring Provider (PT): Hortencia Pilar   Encounter Date: 09/11/2019   PT End of Session - 09/11/19 1019    Visit Number 8    Number of Visits 17    Date for PT Re-Evaluation 10/03/19    PT Start Time 1016    PT Stop Time 1100    PT Time Calculation (min) 44 min    Equipment Utilized During Treatment Gait belt    Activity Tolerance Patient tolerated treatment well    Behavior During Therapy WFL for tasks assessed/performed           Past Medical History:  Diagnosis Date  . Allergy to alpha-gal   . Aortic stenosis   . Arthritis   . Asthma   . Atrial fibrillation ([place])   . Atrial fibrillation ([place])   . Cancer ([place]) 2002  . CHF (congestive heart failure) ([place])   . GERD (gastroesophageal reflux disease)   . Glaucoma   . Gout   . Hypertension   . Neuropathy   . Osteoporosis   . Sleep apnea     Past Surgical History:  Procedure Laterality Date  . ARTHROPLASTY Right    ankle  . BUNIONECTOMY    . CARDIAC CATHETERIZATION N/A 07/02/2015   Procedure: Right/Left Heart Cath and Coronary Angiography;  Surgeon: Teodoro Spray, MD;  Location: [place] [facility];  Service: Cardiovascular;  Laterality: N/A;  . CORONARY/GRAFT ACUTE MI REVASCULARIZATION N/A 10/02/2016   Procedure: Coronary/Graft Acute MI Revascularization;  Surgeon: Wellington Hampshire, MD;  Location: [place] [facility];  Service: Cardiovascular;  Laterality: N/A;  . JOINT REPLACEMENT    . ostatectomy      There were no vitals filed for this visit.   Subjective Assessment - 09/11/19 1026    Subjective Pt reports doing well today; He reports increased RLE leg pain last night but states it is resolved currently; Reports that his balance is improving;     Pertinent History Patient fell and had a pelvic fracture in Nov 2011, then he fell in march , had fx ribs and went to [facility] to recover. He has been in bed from being in the nursing home. He had HHPT but still is having weakness in BLE. He has back and hip pain.    Limitations Other (comment)    How long can you sit comfortably? unlimited    How long can you stand comfortably? less than 15 minutes    How long can you walk comfortably? intermediate distances    Diagnostic tests MRI shows multiple lumbar segment stenosis/facet degeneration    Patient Stated Goals improve strength and walking    Currently in Pain? No/denies    Multiple Pain Sites No              TREATMENT    Ther-ex  Standing with 5# ankle weight: -heel/toe raises x20 reps with cues for erect posture and to keep knee straight for better ankle ROM; Standing hip abduction with BUE support and5# AW x 20 BLE; Stanidng hip flexion marches with 5# AW x 20 BLE; with min VCs to increase erect posture for better hip flexion ROM;  Side stepping x10 feet x2 laps each direction with intermittent HHA;     Patient instructed in  advanced balance exercise  Standing in parallel bars:  Standing on airex foam: -alternate toe taps to 4 inch step with 2-1 rail assist x15 reps bilaterally;\ -standing on airex with feet apart, reaching for cones on 4 inch step unsupported #3 x2 sets with CGA for safety; -Standing one foot on airex, one foot on 4 inch step, BUE ball pass side/side x5 reps each foot on step -modified tandem stance:unsupported standing 10 sec hold x2 reps each foot in front;Pt does exhibit increased difficulty with RLE ahead of LLE with increased right lateral lean noted;  Patient required min VCs for balance stability, including to increase trunk control for less loss of balance with smaller base of support  Standing on 1/2 foam: (Flat side up) -heel/toe rocks with feet apart heel/toe rocks x15 with rail  assist for safety -feet apart, unsupported standing 10 sec hold, progressed to unsupported standing with alternate UE lift x5 reps bilaterally;  Required CGA to min A for safety and cues to improve erect posture and increase weight shift for better stance control  Stepping over orange hurdle: Forward/backward with 2 rail assist x5 reps  Side step with 2 rail assist x5 reps each direction Required mod VCs to increase hip flexion and increase step length for better foot clearance   Pt educated throughout session about proper posture and technique with exercises. Improved exercise technique, movement at target joints, use of target muscles after min to mod verbal, visual, tactile cues.    Patient motivated and participated well within session. Progressed balance exercise, utilizing compliant surface to challenge stance control. He did have increased difficulty reaching outside base of support. Patient does require CGA to min A for most balance activities.                         PT Education - 09/11/19 1019    Education Details LE strengthening, HEP, balance;    Person(s) Educated Patient    Methods Explanation;Verbal cues    Comprehension Verbalized understanding;Returned demonstration;Verbal cues required;Need further instruction            PT Short Term Goals - 08/15/19 1615      PT SHORT TERM GOAL #1   Title Patient will be adherent to HEP at least 3x a week to improve functional strength and balance for better safety at home.    Time 4    Period Weeks    Status New    Target Date 09/12/19             PT Long Term Goals - 08/15/19 1609      PT LONG TERM GOAL #1   Title Patient will increase BLE gross strength to 4+/5 as to improve functional strength for independent gait, increased standing tolerance and increased ADL ability.    Time 8    Period Weeks    Status New    Target Date 10/10/19      PT LONG TERM GOAL #2   Title Patient will increase  10 meter walk test to >1.5m/s as to improve gait speed for better community ambulation and to reduce fall risk.    Baseline 08/15/19=.33 m/sec    Time 8    Period Weeks    Status New    Target Date 10/10/19      PT LONG TERM GOAL #3   Title Patient (> 74 years old) will complete five times sit to stand test in < 15 seconds indicating an  increased LE strength and improved balance.    Baseline 08/15/19=  25.20 sec    Time 8    Period Weeks    Status New    Target Date 10/10/19      PT LONG TERM GOAL #4   Title Patient will be mod I when negotiating a curb with LRAD to exhibit improved gait ability and safety in community.     Time 8    Period Weeks    Status New    Target Date 10/10/19                 Plan - 09/11/19 1150    Clinical Impression Statement Patient presents to therapy with good motivation. Instructed patient in advanced LE strengthening exercise. He was able to exhibit good LE control but does require cues for postural control for better muscle activation; Patient instructed in advanced balance tasks, utilizing compliant surface to challenge stance control. He requires CGA to min A for most balance exercise. He did have difficulty reaching forward with decreased knee flexion likely due to weakness. Patient would benefit from additional skilled PT Intervention to improve strength, balance and mobility;    Personal Factors and Comorbidities Age;Comorbidity 1;Comorbidity 2    Comorbidities spinal stenosis, back pain, neuropathy    Examination-Activity Limitations Bed Mobility    Examination-Participation Restrictions Driving    Stability/Clinical Decision Making Evolving/Moderate complexity    Rehab Potential Fair    Clinical Impairments Affecting Rehab Potential motivated; concerned about progressive stenosis with increased numbness in legs;     PT Frequency 2x / week    PT Duration 4 weeks    PT Treatment/Interventions Aquatic Therapy;Cryotherapy;Electrical  Stimulation;Moist Heat;Therapeutic exercise;Therapeutic activities;Functional mobility training;Stair training;Gait training;Balance training;Neuromuscular re-education;Patient/family education;Orthotic Fit/Training;Manual techniques;Energy conservation    PT Next Visit Plan Continue to work on gait activities and LE strengthening    PT [place] seated marching, hamstring curls, and hip abduction with green tband; trunk rotation and single knee to chest in AM and PM, Standing mini squats; seated heel and toe raises    Consulted and Agree with Plan of Care Patient           Patient will benefit from skilled therapeutic intervention in order to improve the following deficits and impairments:  Decreased endurance, Decreased activity tolerance, Decreased strength, Pain, Decreased balance, Decreased mobility, Difficulty walking, Postural dysfunction, Impaired flexibility, Decreased safety awareness  Visit Diagnosis: Other abnormalities of gait and mobility  Muscle weakness (generalized)     Problem List Patient Active Problem List   Diagnosis Date Noted  . Pelvis fracture ([place]) 02/24/2019  . Atrial fibrillation, chronic ([place]) 02/24/2019  . Essential hypertension 02/24/2019  . Coronary artery disease due to lipid rich plaque 02/24/2019  . Acute on chronic systolic CHF (congestive heart failure) ([place]) 02/24/2019  . Aortic stenosis 02/24/2019  . Pelvic fracture ([place]) 02/24/2019  . B12 deficiency 07/01/2018  . Elevated alkaline phosphatase level 06/16/2018  . Macrocytosis 06/16/2018  . Acute ST elevation myocardial infarction (STEMI) of anterolateral wall ([place])   . Angio-edema   . Nonsustained ventricular tachycardia ([place])   . Acute respiratory failure ([place])   . OSA (obstructive sleep apnea)   . Macroglossia   . Non-STEMI (non-ST elevated myocardial infarction) ([place])   . Anaphylaxis 09/30/2016    Briann Sarchet PT, DPT 09/11/2019, 11:54 AM  [place] MAIN [facility] SERVICES [address] [place], [place], [address] Phone: [phone]   Fax:  [phone]  Name:  Donald Riley MRN: [id number] Date of Birth: [birth year]

## 2019-09-13 ENCOUNTER — Other Ambulatory Visit: Payer: Self-pay

## 2019-09-13 ENCOUNTER — Ambulatory Visit: Payer: Medicare Other

## 2019-09-13 DIAGNOSIS — M6281 Muscle weakness (generalized): Secondary | ICD-10-CM

## 2019-09-13 DIAGNOSIS — R2689 Other abnormalities of gait and mobility: Secondary | ICD-10-CM | POA: Diagnosis not present

## 2019-09-13 NOTE — Therapy (Signed)
[place] MAIN [facility] SERVICES [place], [place], [address] Phone: [phone]   Fax:  [phone]  Physical Therapy Treatment  Patient Details  Name: Donald Riley MRN: [id number] Date of Birth: December 07, 1933 Referring Provider (PT): Hortencia Pilar   Encounter Date: 09/13/2019   PT End of Session - 09/13/19 1140    Visit Number 9    Number of Visits 17    Date for PT Re-Evaluation 10/03/19    PT Start Time 1110    PT Stop Time 1155    PT Time Calculation (min) 45 min    Equipment Utilized During Treatment Gait belt    Activity Tolerance Patient tolerated treatment well    Behavior During Therapy WFL for tasks assessed/performed           Past Medical History:  Diagnosis Date  . Allergy to alpha-gal   . Aortic stenosis   . Arthritis   . Asthma   . Atrial fibrillation ([place])   . Atrial fibrillation ([place])   . Cancer ([place]) 2002  . CHF (congestive heart failure) ([place])   . GERD (gastroesophageal reflux disease)   . Glaucoma   . Gout   . Hypertension   . Neuropathy   . Osteoporosis   . Sleep apnea     Past Surgical History:  Procedure Laterality Date  . ARTHROPLASTY Right    ankle  . BUNIONECTOMY    . CARDIAC CATHETERIZATION N/A 07/02/2015   Procedure: Right/Left Heart Cath and Coronary Angiography;  Surgeon: Teodoro Spray, MD;  Location: [place] [facility];  Service: Cardiovascular;  Laterality: N/A;  . CORONARY/GRAFT ACUTE MI REVASCULARIZATION N/A 10/02/2016   Procedure: Coronary/Graft Acute MI Revascularization;  Surgeon: Wellington Hampshire, MD;  Location: [place] [facility];  Service: Cardiovascular;  Laterality: N/A;  . JOINT REPLACEMENT    . ostatectomy      There were no vitals filed for this visit.   Subjective Assessment - 09/13/19 1132    Subjective Pt reports he doing well today. He reports some low back soreness today which he rates as a 1/10. He is complaining of some increased RLE leg pain at night  recently. Believes that this is related to neuropathy and he denies any radicular pain from his back down his leg. No specific questions upon arrival.    Pertinent History Patient fell and had a pelvic fracture in Nov 2011, then he fell in march , had fx ribs and went to [facility] to recover. He has been in bed from being in the nursing home. He had HHPT but still is having weakness in BLE. He has back and hip pain.    Limitations Other (comment)    How long can you sit comfortably? unlimited    How long can you stand comfortably? less than 15 minutes    How long can you walk comfortably? intermediate distances    Diagnostic tests MRI shows multiple lumbar segment stenosis/facet degeneration    Patient Stated Goals improve strength and walking    Currently in Pain? Yes    Pain Score 1     Pain Location Back    Pain Orientation Lower    Pain Descriptors / Indicators Sore    Pain Type Chronic pain    Pain Onset More than a month ago    Pain Frequency Intermittent            TREATMENT    Ther-ex Nu-Step L2-3x 5 mins for  warm-up during history with therapist adjusting resistance throughout to change intensity(4 minutes unbilled);?  Precor BLE leg press 75# 2 x 20; Sit to stand from regular height chair x 5 without UE support, pt requires minA+1 for first 4 reps and modA+1 for rep 5, added Airex pad on seat and performed an additional 10 repetitions; Seated clams with manual resistance from therapist x 10; Seated adductor squeeze with manual resistance from therapist x 10;   Neuromuscular Re-education 6" forward orange hurdle step over without UE support x 10 on each side; 6" alternating step taps on Airex without UE support x 10 BLE; Airex balance with forward foot on 6" step without UE support x 30s each; Standing on 1/2 foam (Flat side up) static balance without UE support x 60s; Standing on 1/2 foam (Flat side up) heel/toe rocks with feet apart without UE support x 10  each direction;   Pt educated throughout session about proper posture and technique with exercises. Improved exercise technique, movement at target joints, use of target muscles after min to mod verbal, visual, tactile cues.    Pt presents with improved strength and endurance today and is able to progress his resistance on the leg press. He is also able to perform sit to stand with greater ease today however still struggles without Airex pad on seat. He continues to require intermittent seated rest breaks between exercises due to increase in fatigue. Continued with balance training on unstable surfaces such as Airex pad and half foam roller. Decreased righting reactions noted on half foam roller.Pt encouraged to continue HEP and follow-up as scheduled. Pt will benefit from PT services to address deficits in strength, balance, and mobility in order to return to full function at home.                            PT Short Term Goals - 08/15/19 1615      PT SHORT TERM GOAL #1   Title Patient will be adherent to HEP at least 3x a week to improve functional strength and balance for better safety at home.    Time 4    Period Weeks    Status New    Target Date 09/12/19             PT Long Term Goals - 08/15/19 1609      PT LONG TERM GOAL #1   Title Patient will increase BLE gross strength to 4+/5 as to improve functional strength for independent gait, increased standing tolerance and increased ADL ability.    Time 8    Period Weeks    Status New    Target Date 10/10/19      PT LONG TERM GOAL #2   Title Patient will increase 10 meter walk test to >1.28m/s as to improve gait speed for better community ambulation and to reduce fall risk.    Baseline 08/15/19=.33 m/sec    Time 8    Period Weeks    Status New    Target Date 10/10/19      PT LONG TERM GOAL #3   Title Patient (> 46 years old) will complete five times sit to stand test in < 15 seconds indicating an  increased LE strength and improved balance.    Baseline 08/15/19=  25.20 sec    Time 8    Period Weeks    Status New    Target Date 10/10/19  PT LONG TERM GOAL #4   Title Patient will be mod I when negotiating a curb with LRAD to exhibit improved gait ability and safety in community.     Time 8    Period Weeks    Status New    Target Date 10/10/19                 Plan - 09/13/19 1140    Clinical Impression Statement Pt presents with improved strength and endurance today and is able to progress his resistance on the leg press. He is also able to perform sit to stand with greater ease today however still struggles without Airex pad on seat. He continues to require intermittent seated rest breaks between exercises due to increase in fatigue. Continued with balance training on unstable surfaces such as Airex pad and half foam roller. Decreased righting reactions noted on half foam roller. Pt encouraged to continue HEP and follow-up as scheduled. Pt will benefit from PT services to address deficits in strength, balance, and mobility in order to return to full function at home.    Personal Factors and Comorbidities Age;Comorbidity 1;Comorbidity 2    Comorbidities spinal stenosis, back pain, neuropathy    Examination-Activity Limitations Bed Mobility    Examination-Participation Restrictions Driving    Stability/Clinical Decision Making Evolving/Moderate complexity    Rehab Potential Fair    Clinical Impairments Affecting Rehab Potential motivated; concerned about progressive stenosis with increased numbness in legs;     PT Frequency 2x / week    PT Duration 4 weeks    PT Treatment/Interventions Aquatic Therapy;Cryotherapy;Electrical Stimulation;Moist Heat;Therapeutic exercise;Therapeutic activities;Functional mobility training;Stair training;Gait training;Balance training;Neuromuscular re-education;Patient/family education;Orthotic Fit/Training;Manual techniques;Energy conservation     PT Next Visit Plan Update outcome measures and goals, continue to work on gait activities and LE strengthening    PT Home Exercise Plan seated marching, hamstring curls, and hip abduction with green tband; trunk rotation and single knee to chest in AM and PM, Standing mini squats; seated heel and toe raises    Consulted and Agree with Plan of Care Patient           Patient will benefit from skilled therapeutic intervention in order to improve the following deficits and impairments:  Decreased endurance, Decreased activity tolerance, Decreased strength, Pain, Decreased balance, Decreased mobility, Difficulty walking, Postural dysfunction, Impaired flexibility, Decreased safety awareness  Visit Diagnosis: Other abnormalities of gait and mobility  Muscle weakness (generalized)     Problem List Patient Active Problem List   Diagnosis Date Noted  . Pelvis fracture ([place]) 02/24/2019  . Atrial fibrillation, chronic ([place]) 02/24/2019  . Essential hypertension 02/24/2019  . Coronary artery disease due to lipid rich plaque 02/24/2019  . Acute on chronic systolic CHF (congestive heart failure) ([place]) 02/24/2019  . Aortic stenosis 02/24/2019  . Pelvic fracture ([place]) 02/24/2019  . B12 deficiency 07/01/2018  . Elevated alkaline phosphatase level 06/16/2018  . Macrocytosis 06/16/2018  . Acute ST elevation myocardial infarction (STEMI) of anterolateral wall ([place])   . Angio-edema   . Nonsustained ventricular tachycardia ([place])   . Acute respiratory failure ([place])   . OSA (obstructive sleep apnea)   . Macroglossia   . Non-STEMI (non-ST elevated myocardial infarction) ([place])   . Anaphylaxis 09/30/2016   Phillips Grout PT, DPT, GCS  Marcelline Temkin 09/13/2019, 12:38 PM  [place] MAIN [facility] SERVICES [address] [place], [place], [address] Phone: [phone]   Fax:  [phone]  Name: Donald Riley MRN: [id number] Date  of Birth: [birth year]

## 2019-09-18 ENCOUNTER — Ambulatory Visit: Payer: Medicare Other

## 2019-09-18 ENCOUNTER — Other Ambulatory Visit: Payer: Self-pay

## 2019-09-18 DIAGNOSIS — M6281 Muscle weakness (generalized): Secondary | ICD-10-CM

## 2019-09-18 DIAGNOSIS — R2689 Other abnormalities of gait and mobility: Secondary | ICD-10-CM

## 2019-09-18 NOTE — Therapy (Signed)
[place] MAIN [facility] SERVICES [address] [place], [place], [address] Phone: [phone]   Fax:  [phone]  Physical Therapy Progress Note   Dates of reporting period  08/15/19   to   09/18/19  Physical Therapy Treatment  Patient Details  Name: Donald Riley MRN: [id number] Date of Birth: 1933/04/28 Referring Provider (PT): Hortencia Pilar   Encounter Date: 09/18/2019   PT End of Session - 09/18/19 1340    Visit Number 10    Number of Visits 17    Date for PT Re-Evaluation 10/03/19    Authorization Type MCR:    Authorization Time Period cert 1 through 05/05/81    PT Start Time 1302    PT Stop Time 1342    PT Time Calculation (min) 40 min    Activity Tolerance Patient tolerated treatment well;No increased pain    Behavior During Therapy WFL for tasks assessed/performed           Past Medical History:  Diagnosis Date  . Allergy to alpha-gal   . Aortic stenosis   . Arthritis   . Asthma   . Atrial fibrillation ([place])   . Atrial fibrillation ([place])   . Cancer ([place]) 2002  . CHF (congestive heart failure) ([place])   . GERD (gastroesophageal reflux disease)   . Glaucoma   . Gout   . Hypertension   . Neuropathy   . Osteoporosis   . Sleep apnea     Past Surgical History:  Procedure Laterality Date  . ARTHROPLASTY Right    ankle  . BUNIONECTOMY    . CARDIAC CATHETERIZATION N/A 07/02/2015   Procedure: Right/Left Heart Cath and Coronary Angiography;  Surgeon: Teodoro Spray, MD;  Location: [place] [facility];  Service: Cardiovascular;  Laterality: N/A;  . CORONARY/GRAFT ACUTE MI REVASCULARIZATION N/A 10/02/2016   Procedure: Coronary/Graft Acute MI Revascularization;  Surgeon: Wellington Hampshire, MD;  Location: [place] [facility];  Service: Cardiovascular;  Laterality: N/A;  . JOINT REPLACEMENT    . ostatectomy      There were no vitals filed for this visit.       [facility] PT Assessment - 09/18/19 0001      Assessment   Medical  Diagnosis balance , unsteady    Referring Provider (PT) Grandis Heidi    Onset Date/Surgical Date 08/09/19    Hand Dominance Right    Prior Therapy HHPT      Balance Screen   Has the patient fallen in the past 6 months Yes    How many times? 2   no additional falls since starting PT     Observation/Other Assessments   Focus on Therapeutic Outcomes (FOTO)  46/100   41/100 (at evaluation)     Strength   Right Hip Flexion 4+/5    Right Hip ABduction --   5/5 horizontal ABDCT:    Right Hip ADduction 5/5    Left Hip Flexion 4+/5    Left Hip ABduction --    5/5 horizontal ABDCT:    Left Hip ADduction 5/5    Right Knee Flexion 5/5    Right Knee Extension 5/5    Left Knee Flexion 5/5    Left Knee Extension 5/5    Right Ankle Dorsiflexion 5/5    Left Ankle Dorsiflexion 5/5      Transfers   Five time sit to stand comments  19.94sec (hands ad lib (no 4WW)    25 sec at eval  Balance   Balance Assessed Yes      Standardized Balance Assessment   Five times sit to stand comments  19.94sec (hands ad lib)    28 sec with 1 HHA (at eval)    10 Meter Walk 0.80m/s c 4WW (best of 2)    0.13m/s at eval; 0.42m/s Feb '20; 0.25m/s Jan '20)                   PT Short Term Goals - 09/18/19 1334      PT SHORT TERM GOAL #1   Title Patient will be adherent to HEP at least 3x a week to improve functional strength and balance for better safety at home.    Baseline Performing exercises everyday    Time 4    Period Weeks    Status Achieved    Target Date 09/12/19             PT Long Term Goals - 09/18/19 1334      PT LONG TERM GOAL #1   Title Patient will increase BLE gross strength to 4+/5 as to improve functional strength for independent gait, increased standing tolerance and increased ADL ability.    Baseline Straight plane hip extension and abdusction remain limited, but not tested at eval.    Time 8    Period Weeks    Status Achieved    Target Date 10/10/19      PT LONG  TERM GOAL #2   Title Patient will increase 10 meter walk test to >1.4m/s as to improve gait speed for better community ambulation and to reduce fall risk.    Baseline 08/15/19=0.33 m/sec; 0.25m/s    Time 8    Period Weeks    Status On-going    Target Date 10/10/19      PT LONG TERM GOAL #3   Title Patient (> 33 years old) will complete five times sit to stand test in < 15 seconds indicating an increased LE strength and improved balance.    Baseline 08/15/19=  25.20 sec; on 6/21: 19.9sec    Time 8    Period Weeks    Status On-going    Target Date 10/10/19      PT LONG TERM GOAL #4   Title Patient will be mod I when negotiating a curb with LRAD to exhibit improved gait ability and safety in community.     Baseline Per pt report pt has been able to navigate curbs in the community when his wife drops him off.    Time 8    Period Weeks    Status Achieved    Target Date 10/10/19                 Plan - 09/18/19 1345    Clinical Impression Statement Reassessment 10th visit this date. Pt shoes progress in MMT strenght assessment, 5xSTS, 10MWT maximal gait speed. Pt had achieve 1 ST goal, 1 LT goal, and made progress toward 2 LT goals. Pt continues to have significant postural unsteadiness and abnormality. Pt continues to have weakness of bilat hip extension, unable to rise from chair without use of hands. Pt still reliant on device for unsteadiness with a falls history. FOTO score shows some improvement. Pt reports he is now able to get down into floor and back up without any physical assistance. Excellent progress thus far, will continue to benefit from skill intervention to reduce falls risk and improve independence with basic ADL/IADL.    Personal  Factors and Comorbidities Age;Comorbidity 1;Comorbidity 2    Comorbidities spinal stenosis, back pain, neuropathy    Examination-Activity Limitations Bed Mobility    Examination-Participation Restrictions Driving    Stability/Clinical  Decision Making Evolving/Moderate complexity    Clinical Decision Making Moderate    Rehab Potential Fair    Clinical Impairments Affecting Rehab Potential motivated; concerned about progressive stenosis with increased numbness in legs;     PT Frequency 2x / week    PT Duration 4 weeks    PT Treatment/Interventions Aquatic Therapy;Cryotherapy;Electrical Stimulation;Moist Heat;Therapeutic exercise;Therapeutic activities;Functional mobility training;Stair training;Gait training;Balance training;Neuromuscular re-education;Patient/family education;Orthotic Fit/Training;Manual techniques;Energy conservation    PT Next Visit Plan Review current HEP and review as needed; continue with gait training and balance training.    PT Home Exercise Plan seated marching, hamstring curls, and hip abduction with green tband; trunk rotation and single knee to chest in AM and PM, Standing mini squats; seated heel and toe raises    Consulted and Agree with Plan of Care Patient           Patient will benefit from skilled therapeutic intervention in order to improve the following deficits and impairments:  Decreased endurance, Decreased activity tolerance, Decreased strength, Pain, Decreased balance, Decreased mobility, Difficulty walking, Postural dysfunction, Impaired flexibility, Decreased safety awareness  Visit Diagnosis: Other abnormalities of gait and mobility  Muscle weakness (generalized)     Problem List Patient Active Problem List   Diagnosis Date Noted  . Pelvis fracture ([place]) 02/24/2019  . Atrial fibrillation, chronic ([place]) 02/24/2019  . Essential hypertension 02/24/2019  . Coronary artery disease due to lipid rich plaque 02/24/2019  . Acute on chronic systolic CHF (congestive heart failure) ([place]) 02/24/2019  . Aortic stenosis 02/24/2019  . Pelvic fracture ([place]) 02/24/2019  . B12 deficiency 07/01/2018  . Elevated alkaline phosphatase level 06/16/2018  . Macrocytosis 06/16/2018  . Acute ST  elevation myocardial infarction (STEMI) of anterolateral wall ([place])   . Angio-edema   . Nonsustained ventricular tachycardia ([place])   . Acute respiratory failure ([place])   . OSA (obstructive sleep apnea)   . Macroglossia   . Non-STEMI (non-ST elevated myocardial infarction) ([place])   . Anaphylaxis 09/30/2016   1:50 PM, 09/18/19 Etta Grandchild, PT, DPT Physical Therapist - [place] [phone]     Etta Grandchild 09/18/2019, 1:50 PM  [place] MAIN [facility] SERVICES [address] [place], [place], [address] Phone: [phone]   Fax:  [phone]  Name: Donald Riley MRN: [id number] Date of Birth: 07-04-1933

## 2019-09-20 ENCOUNTER — Ambulatory Visit: Payer: Medicare Other

## 2019-09-20 ENCOUNTER — Other Ambulatory Visit: Payer: Self-pay

## 2019-09-20 DIAGNOSIS — R2689 Other abnormalities of gait and mobility: Secondary | ICD-10-CM

## 2019-09-20 DIAGNOSIS — M6281 Muscle weakness (generalized): Secondary | ICD-10-CM

## 2019-09-20 NOTE — Therapy (Signed)
[place] MAIN [facility] SERVICES [address] [place], [place], [address] Phone: [phone]   Fax:  [phone]  Physical Therapy Treatment  Patient Details  Name: Donald Riley MRN: [id number] Date of Birth: [birth year] Referring Provider (PT): Hortencia Pilar   Encounter Date: 09/20/2019   PT End of Session - 09/20/19 1311    Visit Number 11    Number of Visits 17    Date for PT Re-Evaluation 10/03/19    Authorization Type MCR:    Authorization Time Period cert 1 through 05/02/74    PT Start Time 2831    PT Stop Time 1345    PT Time Calculation (min) 40 min    Equipment Utilized During Treatment Gait belt    Activity Tolerance Patient tolerated treatment well;No increased pain    Behavior During Therapy WFL for tasks assessed/performed           Past Medical History:  Diagnosis Date   Allergy to alpha-gal    Aortic stenosis    Arthritis    Asthma    Atrial fibrillation ([place])    Atrial fibrillation ([place])    Cancer ([place]) 2002   CHF (congestive heart failure) (HCC)    GERD (gastroesophageal reflux disease)    Glaucoma    Gout    Hypertension    Neuropathy    Osteoporosis    Sleep apnea     Past Surgical History:  Procedure Laterality Date   ARTHROPLASTY Right    ankle   BUNIONECTOMY     CARDIAC CATHETERIZATION N/A 07/02/2015   Procedure: Right/Left Heart Cath and Coronary Angiography;  Surgeon: Teodoro Spray, MD;  Location: [place] [facility];  Service: Cardiovascular;  Laterality: N/A;   CORONARY/GRAFT ACUTE MI REVASCULARIZATION N/A 10/02/2016   Procedure: Coronary/Graft Acute MI Revascularization;  Surgeon: Wellington Hampshire, MD;  Location: [place] [facility];  Service: Cardiovascular;  Laterality: N/A;   JOINT REPLACEMENT     ostatectomy      There were no vitals filed for this visit.   Subjective Assessment - 09/20/19 1310    Subjective Pt doing well today. Reports he is wearing his LSO as his  back is quite painful without it. No updates otherwise.    Pertinent History Patient fell and had a pelvic fracture in Nov 2011, then he fell in march , had fx ribs and went to [facility] to recover. He has been in bed from being in the nursing home. He had HHPT but still is having weakness in BLE. He has back and hip pain.    Currently in Pain? No/denies           INTERVENTION THIS DATE:  -Nu-step for cardiovascular conditioning (pt does not tolerate longer distances); seat 13, arms 11; **35min vitals 98% 76bpm  **86min vitals 98% 78bpm, DOE 2/4 -Short distance, max velocity AMB intervals 4x43ft c 4WW (seated rest break 60sec between); cues for exaggerated heel strike on RLE (was hitting flat footed)   -STS transfer from standard chair, BUE support between walking intervals -STS from chair+2 airex pads 2x10 hands free -seated hip flexion (2 airex pads) 2x15 (RE assisted due to pain and weakness; 1.5lb AW on LLE 2nd set)  *no back pain with Left hip flexion     PT Short Term Goals - 09/18/19 1334      PT SHORT TERM GOAL #1   Title Patient will be adherent to HEP at least 3x a week to improve  functional strength and balance for better safety at home.    Baseline Performing exercises everyday    Time 4    Period Weeks    Status Achieved    Target Date 09/12/19             PT Long Term Goals - 09/18/19 1334      PT LONG TERM GOAL #1   Title Patient will increase BLE gross strength to 4+/5 as to improve functional strength for independent gait, increased standing tolerance and increased ADL ability.    Baseline Straight plane hip extension and abdusction remain limited, but not tested at eval.    Time 8    Period Weeks    Status Achieved    Target Date 10/10/19      PT LONG TERM GOAL #2   Title Patient will increase 10 meter walk test to >1.77m/s as to improve gait speed for better community ambulation and to reduce fall risk.    Baseline 08/15/19=0.33 m/sec; 0.62m/s    Time  8    Period Weeks    Status On-going    Target Date 10/10/19      PT LONG TERM GOAL #3   Title Patient (> 84 years old) will complete five times sit to stand test in < 15 seconds indicating an increased LE strength and improved balance.    Baseline 08/15/19=  25.20 sec; on 6/21: 19.9sec    Time 8    Period Weeks    Status On-going    Target Date 10/10/19      PT LONG TERM GOAL #4   Title Patient will be mod I when negotiating a curb with LRAD to exhibit improved gait ability and safety in community.     Baseline Per pt report pt has been able to navigate curbs in the community when his wife drops him off.    Time 8    Period Weeks    Status Achieved    Target Date 10/10/19                 Plan - 09/20/19 1312    Clinical Impression Statement Progressed NuStep time to 6 minutes, DOE on 2/4 at end, VSS otherwise. Moved to max speed AMB interval training c 4WW. Ended with hip strength program, assist needed for RLE due to pain and weakness. 2 LOB during transfers. In general, patient demonstrating good tolerance to therapy session this date, reasonable accommodations are alllowed in-session to allow adequate rest between activities as needed. All interventional executed without any exacerbation of pain or other symptoms. Pt given intermittent multimodal cues to teach best possible form with exercises. Pt continues to make steady progress toward most goals. No home exercise updates made at this time.    Personal Factors and Comorbidities Age;Comorbidity 1;Comorbidity 2    Comorbidities spinal stenosis, back pain, neuropathy    Examination-Activity Limitations Bed Mobility    Examination-Participation Restrictions Driving    Stability/Clinical Decision Making Evolving/Moderate complexity    Clinical Decision Making Moderate    Rehab Potential Fair    Clinical Impairments Affecting Rehab Potential motivated; concerned about progressive stenosis with increased numbness in legs;     PT  Frequency 2x / week    PT Duration 4 weeks    PT Treatment/Interventions Aquatic Therapy;Cryotherapy;Electrical Stimulation;Moist Heat;Therapeutic exercise;Therapeutic activities;Functional mobility training;Stair training;Gait training;Balance training;Neuromuscular re-education;Patient/family education;Orthotic Fit/Training;Manual techniques;Energy conservation    PT Next Visit Plan Review current HEP and review as needed; continue with gait training  and balance training.    PT Home Exercise Plan seated marching, hamstring curls, and hip abduction with green tband; trunk rotation and single knee to chest in AM and PM, Standing mini squats; seated heel and toe raises    Consulted and Agree with Plan of Care Patient           Patient will benefit from skilled therapeutic intervention in order to improve the following deficits and impairments:  Decreased endurance, Decreased activity tolerance, Decreased strength, Pain, Decreased balance, Decreased mobility, Difficulty walking, Postural dysfunction, Impaired flexibility, Decreased safety awareness  Visit Diagnosis: Other abnormalities of gait and mobility  Muscle weakness (generalized)     Problem List Patient Active Problem List   Diagnosis Date Noted   Pelvis fracture ([place]) 02/24/2019   Atrial fibrillation, chronic ([place]) 02/24/2019   Essential hypertension 02/24/2019   Coronary artery disease due to lipid rich plaque 02/24/2019   Acute on chronic systolic CHF (congestive heart failure) ([place]) 02/24/2019   Aortic stenosis 02/24/2019   Pelvic fracture ([place]) 02/24/2019   B12 deficiency 07/01/2018   Elevated alkaline phosphatase level 06/16/2018   Macrocytosis 06/16/2018   Acute ST elevation myocardial infarction (STEMI) of anterolateral wall (HCC)    Angio-edema    Nonsustained ventricular tachycardia (HCC)    Acute respiratory failure (HCC)    OSA (obstructive sleep apnea)    Macroglossia    Non-STEMI (non-ST  elevated myocardial infarction) ([place])    Anaphylaxis 09/30/2016   1:43 PM, 09/20/19 Etta Grandchild, PT, DPT Physical Therapist - [place] [phone]     [place] [facility] 09/20/2019, 1:15 PM  [facility] [address] [place], [place], [address] Phone: [phone]   Fax:  [phone]  Name: Donald Riley MRN: [id number] Date of Birth: 05/19/1933

## 2019-09-25 ENCOUNTER — Other Ambulatory Visit: Payer: Self-pay

## 2019-09-25 ENCOUNTER — Ambulatory Visit: Payer: Medicare Other

## 2019-09-25 VITALS — BP 141/78 | HR 71

## 2019-09-25 DIAGNOSIS — R2689 Other abnormalities of gait and mobility: Secondary | ICD-10-CM

## 2019-09-25 DIAGNOSIS — M6281 Muscle weakness (generalized): Secondary | ICD-10-CM

## 2019-09-25 NOTE — Therapy (Signed)
[place] MAIN [facility] SERVICES [address] [place], [place], [address] Phone: [phone]   Fax:  [phone]  Physical Therapy Treatment  Patient Details  Name: Donald Riley MRN: [id number] Date of Birth: January 20, 1934 Referring Provider (PT): Hortencia Pilar   Encounter Date: 09/25/2019   PT End of Session - 09/25/19 1315    Visit Number 12    Number of Visits 17    Date for PT Re-Evaluation 10/03/19    Authorization Type MCR:    Authorization Time Period cert 1 through 0/2/54    PT Start Time 1310    PT Stop Time 1350    PT Time Calculation (min) 40 min    Equipment Utilized During Treatment Gait belt    Activity Tolerance Patient tolerated treatment well;No increased pain    Behavior During Therapy WFL for tasks assessed/performed           Past Medical History:  Diagnosis Date  . Allergy to alpha-gal   . Aortic stenosis   . Arthritis   . Asthma   . Atrial fibrillation ([place])   . Atrial fibrillation ([place])   . Cancer ([place]) 2002  . CHF (congestive heart failure) ([place])   . GERD (gastroesophageal reflux disease)   . Glaucoma   . Gout   . Hypertension   . Neuropathy   . Osteoporosis   . Sleep apnea     Past Surgical History:  Procedure Laterality Date  . ARTHROPLASTY Right    ankle  . BUNIONECTOMY    . CARDIAC CATHETERIZATION N/A 07/02/2015   Procedure: Right/Left Heart Cath and Coronary Angiography;  Surgeon: Teodoro Spray, MD;  Location: [place] [facility];  Service: Cardiovascular;  Laterality: N/A;  . CORONARY/GRAFT ACUTE MI REVASCULARIZATION N/A 10/02/2016   Procedure: Coronary/Graft Acute MI Revascularization;  Surgeon: Wellington Hampshire, MD;  Location: [place] [facility];  Service: Cardiovascular;  Laterality: N/A;  . JOINT REPLACEMENT    . ostatectomy      Vitals:   09/25/19 1314  BP: (!) 141/78  Pulse: 71  SpO2: 98%     Subjective Assessment - 09/25/19 1415    Subjective Pt reports that he is feeling  some mild DOE upon arrival today. It has been worsening over the last week. Tried to call his MD earlier today but didn't get an answer. Reports low back stiffness but denies any acute worsening of pain. Denies nausea, vomiting, chills, fever, urinary frequency, diarhea, blood in stool, cough/congestion, or other changes in health. He does report some mild chest pain with breathing. Denies any radiating pain into jaw, upper back, or LUE. Denies any resting L sided chest pain.    Pertinent History Patient fell and had a pelvic fracture in Nov 2011, then he fell in march , had fx ribs and went to [facility] to recover. He has been in bed from being in the nursing home. He had HHPT but still is having weakness in BLE. He has back and hip pain.    Limitations Other (comment)    How long can you sit comfortably? unlimited    How long can you stand comfortably? less than 15 minutes    How long can you walk comfortably? intermediate distances    Diagnostic tests MRI shows multiple lumbar segment stenosis/facet degeneration    Patient Stated Goals improve strength and walking    Currently in Pain? No/denies   No acute pain at rest, chronic lumbar pain reported  TREATMENT    Neuromuscular Re-education Airex WBOS eyes open/closed x 30s each; Airex WBOS eyes open with horizontal and vertical head turns x 30s each; Airex alternating 6" step taps with faded UE support 1-0 alternating LE x 10 each;  Airex balance with forward foot on 6" step without UE support 2 x 30s each; Standing on 1/2 foam roll (flat side up) static balance without UE support x 30s; Standing on 1/2 foam roll (flat side up) heel/toe rocks with feet apart without UE support x 10 each direction; Tandem balance on 1/2 foam roll (flat side up) alternating forward LE x 30s each;   Pt educated throughout session about proper posture and technique with exercises. Improved exercise technique, movement at target joints,  use of target muscles after min to mod verbal, visual, tactile cues.    Today's session focused on balance today due to pt reporting progressive DOE over the last week. VSS and no LE swelling. Pt denies any chest pain during session and no significant symptoms reported. Pt encouraged to contact his PCP to notify of his symptoms. Pt encouraged to contact his PCP He is also able to perform sit to stand with greater ease today however still struggles without Airex pad on seat. Pt demonstrates difficulty with balance on unstable surfaces such as 1/2 foam roll and Airex pad. He continues to require intermittent seated rest breaks between exercises due to fatigue. Pt encouraged to continue HEP and follow-up as scheduled. Pt will benefit from PT services to address deficits in strength, balance, and mobility in order to return to full function at home.                             PT Short Term Goals - 09/18/19 1334      PT SHORT TERM GOAL #1   Title Patient will be adherent to HEP at least 3x a week to improve functional strength and balance for better safety at home.    Baseline Performing exercises everyday    Time 4    Period Weeks    Status Achieved    Target Date 09/12/19             PT Long Term Goals - 09/18/19 1334      PT LONG TERM GOAL #1   Title Patient will increase BLE gross strength to 4+/5 as to improve functional strength for independent gait, increased standing tolerance and increased ADL ability.    Baseline Straight plane hip extension and abdusction remain limited, but not tested at eval.    Time 8    Period Weeks    Status Achieved    Target Date 10/10/19      PT LONG TERM GOAL #2   Title Patient will increase 10 meter walk test to >1.25m/s as to improve gait speed for better community ambulation and to reduce fall risk.    Baseline 08/15/19=0.33 m/sec; 0.50m/s    Time 8    Period Weeks    Status On-going    Target Date 10/10/19      PT  LONG TERM GOAL #3   Title Patient (> 84 years old) will complete five times sit to stand test in < 15 seconds indicating an increased LE strength and improved balance.    Baseline 08/15/19=  25.20 sec; on 6/21: 19.9sec    Time 8    Period Weeks    Status On-going    Target Date  10/10/19      PT LONG TERM GOAL #4   Title Patient will be mod I when negotiating a curb with LRAD to exhibit improved gait ability and safety in community.     Baseline Per pt report pt has been able to navigate curbs in the community when his wife drops him off.    Time 8    Period Weeks    Status Achieved    Target Date 10/10/19                 Plan - 09/25/19 1325    Clinical Impression Statement Today's session focused on balance today due to pt reporting progressive DOE over the last week. VSS and no LE swelling. Pt denies any chest pain during session and no significant symptoms reported. Pt encouraged to contact his PCP to notify of his symptoms. Pt encouraged to contact his PCP He is also able to perform sit to stand with greater ease today however still struggles without Airex pad on seat. Pt demonstrates difficulty with balance on unstable surfaces such as 1/2 foam roll and Airex pad. He continues to require intermittent seated rest breaks between exercises due to fatigue. Pt encouraged to continue HEP and follow-up as scheduled. Pt will benefit from PT services to address deficits in strength, balance, and mobility in order to return to full function at home.    Personal Factors and Comorbidities Age;Comorbidity 1;Comorbidity 2    Comorbidities spinal stenosis, back pain, neuropathy    Examination-Activity Limitations Bed Mobility    Examination-Participation Restrictions Driving    Stability/Clinical Decision Making Evolving/Moderate complexity    Rehab Potential Fair    Clinical Impairments Affecting Rehab Potential motivated; concerned about progressive stenosis with increased numbness in legs;      PT Frequency 2x / week    PT Duration 4 weeks    PT Treatment/Interventions Aquatic Therapy;Cryotherapy;Electrical Stimulation;Moist Heat;Therapeutic exercise;Therapeutic activities;Functional mobility training;Stair training;Gait training;Balance training;Neuromuscular re-education;Patient/family education;Orthotic Fit/Training;Manual techniques;Energy conservation    PT Next Visit Plan Review current HEP and review as needed; continue with gait training and balance training.    PT Home Exercise Plan seated marching, hamstring curls, and hip abduction with green tband; trunk rotation and single knee to chest in AM and PM, Standing mini squats; seated heel and toe raises    Consulted and Agree with Plan of Care Patient           Patient will benefit from skilled therapeutic intervention in order to improve the following deficits and impairments:  Decreased endurance, Decreased activity tolerance, Decreased strength, Pain, Decreased balance, Decreased mobility, Difficulty walking, Postural dysfunction, Impaired flexibility, Decreased safety awareness  Visit Diagnosis: Other abnormalities of gait and mobility  Muscle weakness (generalized)     Problem List Patient Active Problem List   Diagnosis Date Noted  . Pelvis fracture ([place]) 02/24/2019  . Atrial fibrillation, chronic ([place]) 02/24/2019  . Essential hypertension 02/24/2019  . Coronary artery disease due to lipid rich plaque 02/24/2019  . Acute on chronic systolic CHF (congestive heart failure) ([place]) 02/24/2019  . Aortic stenosis 02/24/2019  . Pelvic fracture ([place]) 02/24/2019  . B12 deficiency 07/01/2018  . Elevated alkaline phosphatase level 06/16/2018  . Macrocytosis 06/16/2018  . Acute ST elevation myocardial infarction (STEMI) of anterolateral wall ([place])   . Angio-edema   . Nonsustained ventricular tachycardia ([place])   . Acute respiratory failure ([place])   . OSA (obstructive sleep apnea)   . Macroglossia   . Non-STEMI  (non-ST elevated myocardial  infarction) ([place])   . Anaphylaxis 09/30/2016   Phillips Grout PT, DPT, GCS  Jasara Corrigan 09/25/2019, 4:15 PM  [place] MAIN [facility] SERVICES [address] [place], [place], [address] Phone: [phone]   Fax:  [phone]  Name: Donald Riley MRN: [id number] Date of Birth: Apr 04, 1933

## 2019-09-27 ENCOUNTER — Other Ambulatory Visit: Payer: Self-pay

## 2019-09-27 ENCOUNTER — Ambulatory Visit: Payer: Medicare Other

## 2019-09-27 DIAGNOSIS — M6281 Muscle weakness (generalized): Secondary | ICD-10-CM

## 2019-09-27 DIAGNOSIS — R2689 Other abnormalities of gait and mobility: Secondary | ICD-10-CM

## 2019-09-27 NOTE — Therapy (Signed)
[place] MAIN [facility] SERVICES [address] [place], [place], [address] Phone: [phone]   Fax:  [phone]  Physical Therapy Treatment  Patient Details  Name: Donald Riley MRN: [id number] Date of Birth: 30-Mar-1934 Referring Provider (PT): Hortencia Pilar   Encounter Date: 09/27/2019   PT End of Session - 09/27/19 1304    Visit Number 13    Number of Visits 17    Date for PT Re-Evaluation 10/03/19    Authorization Type MCR:    Authorization Time Period cert 1 through 05/31/72    PT Start Time 1300    PT Stop Time 1345    PT Time Calculation (min) 45 min    Equipment Utilized During Treatment Gait belt    Activity Tolerance Patient tolerated treatment well;No increased pain    Behavior During Therapy WFL for tasks assessed/performed           Past Medical History:  Diagnosis Date  . Allergy to alpha-gal   . Aortic stenosis   . Arthritis   . Asthma   . Atrial fibrillation ([place])   . Atrial fibrillation ([place])   . Cancer ([place]) 2002  . CHF (congestive heart failure) ([place])   . GERD (gastroesophageal reflux disease)   . Glaucoma   . Gout   . Hypertension   . Neuropathy   . Osteoporosis   . Sleep apnea     Past Surgical History:  Procedure Laterality Date  . ARTHROPLASTY Right    ankle  . BUNIONECTOMY    . CARDIAC CATHETERIZATION N/A 07/02/2015   Procedure: Right/Left Heart Cath and Coronary Angiography;  Surgeon: Teodoro Spray, MD;  Location: [place] [facility];  Service: Cardiovascular;  Laterality: N/A;  . CORONARY/GRAFT ACUTE MI REVASCULARIZATION N/A 10/02/2016   Procedure: Coronary/Graft Acute MI Revascularization;  Surgeon: Wellington Hampshire, MD;  Location: [place] [facility];  Service: Cardiovascular;  Laterality: N/A;  . JOINT REPLACEMENT    . ostatectomy      There were no vitals filed for this visit.   Subjective Assessment - 09/27/19 1305    Subjective Pt reports that he is doing well today. His DOE is improved  today. States that he had improvement with his albuterol inhaler as well as after using his Bipap machine. Continues today with his typical low back pain/stiffness. No specific questions or concerns currently.    Pertinent History Patient fell and had a pelvic fracture in Nov 2011, then he fell in march , had fx ribs and went to [facility] to recover. He has been in bed from being in the nursing home. He had HHPT but still is having weakness in BLE. He has back and hip pain.    Limitations Other (comment)    How long can you sit comfortably? unlimited    How long can you stand comfortably? less than 15 minutes    How long can you walk comfortably? intermediate distances    Diagnostic tests MRI shows multiple lumbar segment stenosis/facet degeneration    Patient Stated Goals improve strength and walking    Currently in Pain? No/denies   No acute pain at rest but chronic low back stiffness/pain with movement.             TREATMENT    Ther-ex Nu-Step L2-3x 5 mins for warm-up during history with therapist adjusting resistance throughout to change intensity(4 minutes unbilled);?  Precor BLE leg press 85# 2 x 20; Matrix forward resisted gait with rollator  7.5# x 3; Sit to stand from regular height chair x 5 without UE support, pt requires modA+1 for all 5 reps; added Airex pad on seat and performed an additional 10 repetitions with pt requiring intermittent minA+1 assist; Standing mini squats x 10 with BUE support;  Standing exercises with 4# ankle weights: Hip flexion marches x 10; Hip abduction x 10; Hip extension x 10;  Split squat alternating forward LE with SUE support    Pt educated throughout session about proper posture and technique with exercises. Improved exercise technique, movement at target joints, use of target muscles after min to mod verbal, visual, tactile cues.    Pt demonstrates excellent motivation during session today. He is able to increase his  resistance on the leg press however struggles more with sit to stand execises. Session focused on strengthening as his DOE is improved and last session was centered around balance interventions. He continues to require intermittent seated rest breaks between exercises due to increase in fatigue. He will need a recertification at next visit however his outcome measures/goals were just updated on 09/18/19. Pt encouraged to continue HEP and follow-up as scheduled. Pt will benefit from PT services to address deficits in strength, balance, and mobility in order to return to full function at home.                           PT Short Term Goals - 09/18/19 1334      PT SHORT TERM GOAL #1   Title Patient will be adherent to HEP at least 3x a week to improve functional strength and balance for better safety at home.    Baseline Performing exercises everyday    Time 4    Period Weeks    Status Achieved    Target Date 09/12/19             PT Long Term Goals - 09/18/19 1334      PT LONG TERM GOAL #1   Title Patient will increase BLE gross strength to 4+/5 as to improve functional strength for independent gait, increased standing tolerance and increased ADL ability.    Baseline Straight plane hip extension and abdusction remain limited, but not tested at eval.    Time 8    Period Weeks    Status Achieved    Target Date 10/10/19      PT LONG TERM GOAL #2   Title Patient will increase 10 meter walk test to >1.82m/s as to improve gait speed for better community ambulation and to reduce fall risk.    Baseline 08/15/19=0.33 m/sec; 0.20m/s    Time 8    Period Weeks    Status On-going    Target Date 10/10/19      PT LONG TERM GOAL #3   Title Patient (> 82 years old) will complete five times sit to stand test in < 15 seconds indicating an increased LE strength and improved balance.    Baseline 08/15/19=  25.20 sec; on 6/21: 19.9sec    Time 8    Period Weeks    Status On-going     Target Date 10/10/19      PT LONG TERM GOAL #4   Title Patient will be mod I when negotiating a curb with LRAD to exhibit improved gait ability and safety in community.     Baseline Per pt report pt has been able to navigate curbs in the community when his wife drops him  off.    Time 8    Period Weeks    Status Achieved    Target Date 10/10/19                 Plan - 09/27/19 1309    Clinical Impression Statement Pt demonstrates excellent motivation during session today. He is able to increase his resistance on the leg press however struggles more with sit to stand execises. Session focused on strengthening as his DOE is improved and last session was centered around balance interventions. He continues to require intermittent seated rest breaks between exercises due to increase in fatigue. He will need a recertification at next visit however his outcome measures/goals were just updated on 09/18/19. Pt encouraged to continue HEP and follow-up as scheduled. Pt will benefit from PT services to address deficits in strength, balance, and mobility in order to return to full function at home.    Personal Factors and Comorbidities Age;Comorbidity 1;Comorbidity 2    Comorbidities spinal stenosis, back pain, neuropathy    Examination-Activity Limitations Bed Mobility    Examination-Participation Restrictions Driving    Stability/Clinical Decision Making Evolving/Moderate complexity    Rehab Potential Fair    Clinical Impairments Affecting Rehab Potential motivated; concerned about progressive stenosis with increased numbness in legs;     PT Frequency 2x / week    PT Duration 4 weeks    PT Treatment/Interventions Aquatic Therapy;Cryotherapy;Electrical Stimulation;Moist Heat;Therapeutic exercise;Therapeutic activities;Functional mobility training;Stair training;Gait training;Balance training;Neuromuscular re-education;Patient/family education;Orthotic Fit/Training;Manual techniques;Energy conservation     PT Next Visit Plan Recertification, goals updated 09/18/19; Review current HEP and review as needed; continue with gait training and balance training.    PT Home Exercise Plan seated marching, hamstring curls, and hip abduction with green tband; trunk rotation and single knee to chest in AM and PM, Standing mini squats; seated heel and toe raises    Consulted and Agree with Plan of Care Patient           Patient will benefit from skilled therapeutic intervention in order to improve the following deficits and impairments:  Decreased endurance, Decreased activity tolerance, Decreased strength, Pain, Decreased balance, Decreased mobility, Difficulty walking, Postural dysfunction, Impaired flexibility, Decreased safety awareness  Visit Diagnosis: Other abnormalities of gait and mobility  Muscle weakness (generalized)     Problem List Patient Active Problem List   Diagnosis Date Noted  . Pelvis fracture ([place]) 02/24/2019  . Atrial fibrillation, chronic ([place]) 02/24/2019  . Essential hypertension 02/24/2019  . Coronary artery disease due to lipid rich plaque 02/24/2019  . Acute on chronic systolic CHF (congestive heart failure) ([place]) 02/24/2019  . Aortic stenosis 02/24/2019  . Pelvic fracture ([place]) 02/24/2019  . B12 deficiency 07/01/2018  . Elevated alkaline phosphatase level 06/16/2018  . Macrocytosis 06/16/2018  . Acute ST elevation myocardial infarction (STEMI) of anterolateral wall ([place])   . Angio-edema   . Nonsustained ventricular tachycardia ([place])   . Acute respiratory failure ([place])   . OSA (obstructive sleep apnea)   . Macroglossia   . Non-STEMI (non-ST elevated myocardial infarction) ([place])   . Anaphylaxis 09/30/2016   Phillips Grout PT, DPT, GCS  Ambrie Carte 09/27/2019, 3:16 PM  [place] MAIN [facility] SERVICES [address] [place], [place], [address] Phone: [phone]   Fax:  [phone]  Name: LORD LANCOUR MRN:  [id number] Date of Birth: 02-25-1934

## 2019-10-04 ENCOUNTER — Other Ambulatory Visit: Payer: Self-pay

## 2019-10-04 ENCOUNTER — Ambulatory Visit: Payer: Medicare Other | Attending: Family Medicine

## 2019-10-04 DIAGNOSIS — R2689 Other abnormalities of gait and mobility: Secondary | ICD-10-CM | POA: Diagnosis not present

## 2019-10-04 DIAGNOSIS — M6281 Muscle weakness (generalized): Secondary | ICD-10-CM | POA: Diagnosis present

## 2019-10-04 NOTE — Therapy (Signed)
[place] MAIN [facility] SERVICES [address] [place], [place], [address] Phone: [phone]   Fax:  [phone]  Physical Therapy Treatment/Recertification  Patient Details  Name: Donald Riley MRN: [id number] Date of Birth: [birth year] Referring Provider (PT): Hortencia Pilar   Encounter Date: 10/04/2019   PT End of Session - 10/04/19 1313    Visit Number 14    Number of Visits 33    Date for PT Re-Evaluation 11/29/19    Authorization Type MCR, recertification 08/05/50    Authorization Time Period --    PT Start Time 1310    PT Stop Time 1345    PT Time Calculation (min) 35 min    Equipment Utilized During Treatment Gait belt    Activity Tolerance Patient tolerated treatment well;No increased pain    Behavior During Therapy WFL for tasks assessed/performed           Past Medical History:  Diagnosis Date   Allergy to alpha-gal    Aortic stenosis    Arthritis    Asthma    Atrial fibrillation ([employer])    Atrial fibrillation ([place])    Cancer ([place]) 2002   CHF (congestive heart failure) ([facility])    GERD (gastroesophageal reflux disease)    Glaucoma    Gout    Hypertension    Neuropathy    Osteoporosis    Sleep apnea     Past Surgical History:  Procedure Laterality Date   ARTHROPLASTY Right    ankle   BUNIONECTOMY     CARDIAC CATHETERIZATION N/A 07/02/2015   Procedure: Right/Left Heart Cath and Coronary Angiography;  Surgeon: Teodoro Spray, MD;  Location: [place] [facility];  Service: Cardiovascular;  Laterality: N/A;   CORONARY/GRAFT ACUTE MI REVASCULARIZATION N/A 10/02/2016   Procedure: Coronary/Graft Acute MI Revascularization;  Surgeon: Wellington Hampshire, MD;  Location: [place] [facility];  Service: Cardiovascular;  Laterality: N/A;   JOINT REPLACEMENT     ostatectomy      There were no vitals filed for this visit.   Subjective Assessment - 10/04/19 1311    Subjective Pt reports that he is doing well today.  His DOE is improved. Continues today with his typical low back pain/stiffness but nothing acute. No specific questions or concerns currently.    Pertinent History Patient fell and had a pelvic fracture in Nov 2011, then he fell in march , had fx ribs and went to [facility] to recover. He has been in bed from being in the nursing home. He had HHPT but still is having weakness in BLE. He has back and hip pain.    Limitations Other (comment)    How long can you sit comfortably? unlimited    How long can you stand comfortably? less than 15 minutes    How long can you walk comfortably? intermediate distances    Diagnostic tests MRI shows multiple lumbar segment stenosis/facet degeneration    Patient Stated Goals improve strength and walking    Currently in Pain? No/denies                 TREATMENT    Ther-ex Nu-Step L2-3x 7 mins for warm-up during history with therapist adjusting resistance throughout to change intensity(4 minutes unbilled);?  Precor BLE leg press 85# 2 x 20; Matrix forward resisted gait with rollator 7.5# x 3; Sit to stand from regular height chair x 5 without UE support, pt requires modA+1 for all 5 reps; added Airex pad on  seat and performed an additional 10 repetitions with pt requiring intermittent minA+1 assist; Standing mini squats x 15 with BUE support;   Neuromuscular Re-education  Airex WBOS/NBOS eye open balance x 60s each; Airex alternating stepping stone taps with SUE support x 10 on each side;    Pt educated throughout session about proper posture and technique with exercises. Improved exercise technique, movement at target joints, use of target muscles after min to mod verbal, visual, tactile cues.     Reassessment performed on this date. Goals last updated on 09/18/19 so not updated again today. Pt shows progress in MMT strenght assessment, 5xSTS, 10MWT maximal gait speed when last updated. Pt had achieved 1 short goal, 2 long term goals and  made progress toward 2 additional long term goals. Pt continues to have significant postural unsteadiness and abnormality. He continues to have weakness of bilat hip extension, unable to rise from chair without use of hands. Pt still reliant on device for unsteadiness with a falls history. FOTO score shows some improvement.. Excellent progress thus far and pt will continue to benefit from skilled intervention to reduce falls risk and improve independence with basic ADL/IADLs.                      PT Short Term Goals - 10/04/19 1317      PT SHORT TERM GOAL #1   Title Patient will be adherent to HEP at least 3x a week to improve functional strength and balance for better safety at home.    Baseline Performing exercises everyday    Time 4    Period Weeks    Status Achieved    Target Date 09/12/19             PT Long Term Goals - 10/04/19 1317      PT LONG TERM GOAL #1   Title Patient will increase BLE gross strength to 4+/5 as to improve functional strength for independent gait, increased standing tolerance and increased ADL ability.    Baseline Straight plane hip extension and abdusction remain limited, but not tested at eval.    Time 8    Period Weeks    Status Achieved      PT LONG TERM GOAL #2   Title Patient will increase 10 meter walk test to >1.50ms as to improve gait speed for better community ambulation and to reduce fall risk.    Baseline 08/15/19=0.33 m/sec; 09/18/19: 0.564m    Time 8    Period Weeks    Status Partially Met    Target Date 11/29/19      PT LONG TERM GOAL #3   Title Patient (> 6074ears old) will complete five times sit to stand test in < 15 seconds indicating an increased LE strength and improved balance.    Baseline 08/15/19=  25.20 sec; on 6/21: 19.9sec    Time 8    Period Weeks    Status Partially Met    Target Date 11/29/19      PT LONG TERM GOAL #4   Title Patient will be mod I when negotiating a curb with LRAD to exhibit  improved gait ability and safety in community.     Baseline Per pt report pt has been able to navigate curbs in the community when his wife drops him off.    Time 8    Period Weeks    Status Achieved  Plan - 10/04/19 1313    Clinical Impression Statement Reassessment performed on this date. Goals last updated on 09/18/19 so not updated again today. Pt shows progress in MMT strenght assessment, 5xSTS, 10MWT maximal gait speed when last updated. Pt had achieved 1 short goal, 2 long term goals and made progress toward 2 additional long term goals. Pt continues to have significant postural unsteadiness and abnormality. He continues to have weakness of bilat hip extension, unable to rise from chair without use of hands. Pt still reliant on device for unsteadiness with a falls history. FOTO score shows some improvement.. Excellent progress thus far and pt will continue to benefit from skilled intervention to reduce falls risk and improve independence with basic ADL/IADLs.    Personal Factors and Comorbidities Age;Comorbidity 1;Comorbidity 2    Comorbidities spinal stenosis, back pain, neuropathy    Examination-Activity Limitations Bed Mobility    Examination-Participation Restrictions Driving    Stability/Clinical Decision Making Evolving/Moderate complexity    Rehab Potential Fair    Clinical Impairments Affecting Rehab Potential motivated; concerned about progressive stenosis with increased numbness in legs;     PT Frequency 2x / week    PT Duration 8 weeks    PT Treatment/Interventions Aquatic Therapy;Cryotherapy;Electrical Stimulation;Moist Heat;Therapeutic exercise;Therapeutic activities;Functional mobility training;Stair training;Gait training;Balance training;Neuromuscular re-education;Patient/family education;Orthotic Fit/Training;Manual techniques;Energy conservation    PT Next Visit Plan Recertification, goals updated 09/18/19; Review current HEP and review as needed;  continue with gait training and balance training.    PT Home Exercise Plan seated marching, hamstring curls, and hip abduction with green tband; trunk rotation and single knee to chest in AM and PM, Standing mini squats; seated heel and toe raises    Consulted and Agree with Plan of Care Patient           Patient will benefit from skilled therapeutic intervention in order to improve the following deficits and impairments:  Decreased endurance, Decreased activity tolerance, Decreased strength, Pain, Decreased balance, Decreased mobility, Difficulty walking, Postural dysfunction, Impaired flexibility, Decreased safety awareness  Visit Diagnosis: Other abnormalities of gait and mobility  Muscle weakness (generalized)     Problem List Patient Active Problem List   Diagnosis Date Noted   Pelvis fracture ([place]) 02/24/2019   Atrial fibrillation, chronic ([place]) 02/24/2019   Essential hypertension 02/24/2019   Coronary artery disease due to lipid rich plaque 02/24/2019   Acute on chronic systolic CHF (congestive heart failure) ([place]) 02/24/2019   Aortic stenosis 02/24/2019   Pelvic fracture ([place]) 02/24/2019   B12 deficiency 07/01/2018   Elevated alkaline phosphatase level 06/16/2018   Macrocytosis 06/16/2018   Acute ST elevation myocardial infarction (STEMI) of anterolateral wall ([facility])    Angio-edema    Nonsustained ventricular tachycardia ([facility])    Acute respiratory failure ([facility])    OSA (obstructive sleep apnea)    Macroglossia    Non-STEMI (non-ST elevated myocardial infarction) ([place])    Anaphylaxis 09/30/2016   Phillips Grout PT, DPT, GCS  Bernell Sigal 10/04/2019, 3:01 PM  [facility] [address] [place], [place], [address] Phone: [phone]   Fax:  [phone]  Name: Donald Riley MRN: [id number] Date of Birth: 1933-07-07

## 2019-10-09 ENCOUNTER — Encounter: Payer: Self-pay | Admitting: Physical Therapy

## 2019-10-09 ENCOUNTER — Ambulatory Visit: Payer: Medicare Other | Admitting: Physical Therapy

## 2019-10-10 ENCOUNTER — Other Ambulatory Visit: Payer: Self-pay | Admitting: Cardiology

## 2019-10-10 DIAGNOSIS — I5023 Acute on chronic systolic (congestive) heart failure: Secondary | ICD-10-CM

## 2019-10-10 NOTE — Therapy (Signed)
[place] MAIN [facility] SERVICES [address] [place], [place], [address] Phone: [phone]   Fax:  [phone]  Patient Details  Name: Donald Riley MRN: [id number] Date of Birth: [birth year] Referring Provider:  Hortencia Pilar, MD  Encounter Date: 10/09/2019  This encounter was accidentally opened but patient no-showed for his appointment.   Emil Weigold PT, DPT 10/10/2019, 12:13 PM  [place] MAIN [facility] SERVICES [address] [place], [place], [address] Phone: [phone]   Fax:  [phone]

## 2019-10-11 ENCOUNTER — Ambulatory Visit: Payer: Medicare Other

## 2019-10-11 ENCOUNTER — Other Ambulatory Visit: Payer: Self-pay

## 2019-10-11 DIAGNOSIS — R2689 Other abnormalities of gait and mobility: Secondary | ICD-10-CM

## 2019-10-11 DIAGNOSIS — M6281 Muscle weakness (generalized): Secondary | ICD-10-CM

## 2019-10-11 NOTE — Therapy (Signed)
[place] MAIN [facility] SERVICES [address] [place], [place], [address] Phone: [phone]   Fax:  [phone]  Physical Therapy Treatment  Patient Details  Name: Donald Riley MRN: [id number] Date of Birth: 03-Oct-1933 Referring Provider (PT): Hortencia Pilar   Encounter Date: 10/11/2019   PT End of Session - 10/11/19 1351    Visit Number 15    Number of Visits 33    Date for PT Re-Evaluation 11/29/19    Authorization Type MCR, recertification 3/0/07    PT Start Time 1310    PT Stop Time 1345    PT Time Calculation (min) 35 min    Equipment Utilized During Treatment Gait belt    Activity Tolerance Patient tolerated treatment well;No increased pain    Behavior During Therapy WFL for tasks assessed/performed           Past Medical History:  Diagnosis Date  . Allergy to alpha-gal   . Aortic stenosis   . Arthritis   . Asthma   . Atrial fibrillation ([place])   . Atrial fibrillation ([place])   . Cancer ([place]) 2002  . CHF (congestive heart failure) ([place])   . GERD (gastroesophageal reflux disease)   . Glaucoma   . Gout   . Hypertension   . Neuropathy   . Osteoporosis   . Sleep apnea     Past Surgical History:  Procedure Laterality Date  . ARTHROPLASTY Right    ankle  . BUNIONECTOMY    . CARDIAC CATHETERIZATION N/A 07/02/2015   Procedure: Right/Left Heart Cath and Coronary Angiography;  Surgeon: Teodoro Spray, MD;  Location: [place] [facility];  Service: Cardiovascular;  Laterality: N/A;  . CORONARY/GRAFT ACUTE MI REVASCULARIZATION N/A 10/02/2016   Procedure: Coronary/Graft Acute MI Revascularization;  Surgeon: Wellington Hampshire, MD;  Location: [place] [facility];  Service: Cardiovascular;  Laterality: N/A;  . JOINT REPLACEMENT    . ostatectomy      There were no vitals filed for this visit.   Subjective Assessment - 10/11/19 1314    Subjective Pt reports that he is doing well today. He saw his cardiologist on Friday who ordered a  chest CT and echocardiogram to figure out the shortness of breath. Denies any falls or stumbles. He notes his low back pain/stiffness is doing okay today. No specific questions or concerns currently.    Pertinent History Patient fell and had a pelvic fracture in Nov 2011, then he fell in march , had fx ribs and went to [facility] to recover. He has been in bed from being in the nursing home. He had HHPT but still is having weakness in BLE. He has back and hip pain.    Limitations Other (comment)    How long can you sit comfortably? unlimited    How long can you stand comfortably? less than 15 minutes    How long can you walk comfortably? intermediate distances    Diagnostic tests MRI shows multiple lumbar segment stenosis/facet degeneration    Patient Stated Goals improve strength and walking    Currently in Pain? No/denies            TREATMENT   Ther-ex  Precor BLE leg press 85# x 20; Sit to stand from regular height chair with UE support, pt requires minA+1 for 3 reps; unable to perform without UE support added Airex pad on seat and performed an additional 10 repetitions with pt requiring modA+1 assist; Step ups onto 6" step with  UE support x 10 each leg.  Standing exercises with 4# ankle weights: UE support needed, cued to correct form Hip flexion marches x 15; Hip abduction x 15; Hip extension x 15; Heel/Toe raises x 10 each, unable to clear floor     Neuromuscular Re-education  Alternating 6" step taps with faded UE support 1-0 alternating LE x 10 each;  Feet together with R UE reaches and throwing on dart board with faded UE support x 20    Pt educated throughout session about proper posture and technique with exercises. Improved exercise technique, movement at target joints, use of target muscles after min to mod verbal, visual, tactile cues.     Pt demonstrates excellent motivation during session today. Patient arrived late, so therapy session was abbreviated. Much of  the session focused on strengthening with a few balance exercises. He was out of breath more than usual today, requiring lots of rest breaks due to fatigue. He continues to struggle with sit to stands, but able to perform leg press well. He was able to increase the reps for the hip exercises, showing increase in strength and endurance. Pt encouraged to continue HEP and follow-up as scheduled. Pt will benefit from PT services to address deficits in strength, balance, and mobility in order to return to full function at home.         PT Short Term Goals - 10/04/19 1317      PT SHORT TERM GOAL #1   Title Patient will be adherent to HEP at least 3x a week to improve functional strength and balance for better safety at home.    Baseline Performing exercises everyday    Time 4    Period Weeks    Status Achieved    Target Date 09/12/19             PT Long Term Goals - 10/04/19 1317      PT LONG TERM GOAL #1   Title Patient will increase BLE gross strength to 4+/5 as to improve functional strength for independent gait, increased standing tolerance and increased ADL ability.    Baseline Straight plane hip extension and abdusction remain limited, but not tested at eval.    Time 8    Period Weeks    Status Achieved      PT LONG TERM GOAL #2   Title Patient will increase 10 meter walk test to >1.38ms as to improve gait speed for better community ambulation and to reduce fall risk.    Baseline 08/15/19=0.33 m/sec; 09/18/19: 0.549m    Time 8    Period Weeks    Status Partially Met    Target Date 11/29/19      PT LONG TERM GOAL #3   Title Patient (> 6084ears old) will complete five times sit to stand test in < 15 seconds indicating an increased LE strength and improved balance.    Baseline 08/15/19=  25.20 sec; on 6/21: 19.9sec    Time 8    Period Weeks    Status Partially Met    Target Date 11/29/19      PT LONG TERM GOAL #4   Title Patient will be mod I when negotiating a curb with  LRAD to exhibit improved gait ability and safety in community.     Baseline Per pt report pt has been able to navigate curbs in the community when his wife drops him off.    Time 8    Period Weeks  Status Achieved                 Plan - 10/11/19 1411    Clinical Impression Statement Pt demonstrates excellent motivation during session today. Patient arrived late, so therapy session was abbreviated. Much of the session focused on strengthening with a few balance exercises. He was out of breath more than usual today, requiring lots of rest breaks due to fatigue. He continues to struggle with sit to stands, but able to perform leg press well. He was able to increase the reps for the hip exercises, showing increase in strength and endurance. Pt encouraged to continue HEP and follow-up as scheduled. Pt will benefit from PT services to address deficits in strength, balance, and mobility in order to return to full function at home.    Personal Factors and Comorbidities Age;Comorbidity 1;Comorbidity 2    Comorbidities spinal stenosis, back pain, neuropathy    Examination-Activity Limitations Bed Mobility    Examination-Participation Restrictions Driving    Stability/Clinical Decision Making Evolving/Moderate complexity    Rehab Potential Fair    Clinical Impairments Affecting Rehab Potential motivated; concerned about progressive stenosis with increased numbness in legs;     PT Frequency 2x / week    PT Duration 8 weeks    PT Treatment/Interventions Aquatic Therapy;Cryotherapy;Electrical Stimulation;Moist Heat;Therapeutic exercise;Therapeutic activities;Functional mobility training;Stair training;Gait training;Balance training;Neuromuscular re-education;Patient/family education;Orthotic Fit/Training;Manual techniques;Energy conservation    PT Next Visit Plan Review current HEP and review as needed; continue with gait training and balance training.    PT Home Exercise Plan seated marching,  hamstring curls, and hip abduction with green tband; trunk rotation and single knee to chest in AM and PM, Standing mini squats; seated heel and toe raises    Consulted and Agree with Plan of Care Patient           Patient will benefit from skilled therapeutic intervention in order to improve the following deficits and impairments:  Decreased endurance, Decreased activity tolerance, Decreased strength, Pain, Decreased balance, Decreased mobility, Difficulty walking, Postural dysfunction, Impaired flexibility, Decreased safety awareness  Visit Diagnosis: Other abnormalities of gait and mobility  Muscle weakness (generalized)     Problem List Patient Active Problem List   Diagnosis Date Noted  . Pelvis fracture ([place]) 02/24/2019  . Atrial fibrillation, chronic ([place]) 02/24/2019  . Essential hypertension 02/24/2019  . Coronary artery disease due to lipid rich plaque 02/24/2019  . Acute on chronic systolic CHF (congestive heart failure) ([place]) 02/24/2019  . Aortic stenosis 02/24/2019  . Pelvic fracture ([place]) 02/24/2019  . B12 deficiency 07/01/2018  . Elevated alkaline phosphatase level 06/16/2018  . Macrocytosis 06/16/2018  . Acute ST elevation myocardial infarction (STEMI) of anterolateral wall ([place])   . Angio-edema   . Nonsustained ventricular tachycardia ([place])   . Acute respiratory failure ([place])   . OSA (obstructive sleep apnea)   . Macroglossia   . Non-STEMI (non-ST elevated myocardial infarction) ([place])   . Anaphylaxis 09/30/2016    This entire session was performed under direct supervision and direction of a licensed therapist/therapist assistant . I have personally read, edited and approve of the note as written.    Noemi Chapel, SPT Phillips Grout PT, DPT, GCS  Huprich,Jason 10/11/2019, 4:13 PM  [place] MAIN [facility] SERVICES [address] [place], [place], [address] Phone: [phone]   Fax:  [phone]  Name: Donald Riley MRN: [id number] Date of Birth: 07/07/1933

## 2019-10-16 ENCOUNTER — Ambulatory Visit: Payer: Medicare Other

## 2019-10-16 ENCOUNTER — Other Ambulatory Visit: Payer: Self-pay

## 2019-10-16 DIAGNOSIS — M6281 Muscle weakness (generalized): Secondary | ICD-10-CM

## 2019-10-16 DIAGNOSIS — R2689 Other abnormalities of gait and mobility: Secondary | ICD-10-CM | POA: Diagnosis not present
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
## 2019-10-18 ENCOUNTER — Ambulatory Visit: Payer: Medicare Other

## 2019-10-18 ENCOUNTER — Other Ambulatory Visit: Payer: Self-pay

## 2019-10-18 DIAGNOSIS — R2689 Other abnormalities of gait and mobility: Secondary | ICD-10-CM | POA: Diagnosis not present

## 2019-10-18 DIAGNOSIS — M6281 Muscle weakness (generalized): Secondary | ICD-10-CM

## 2019-10-18 NOTE — Therapy (Signed)
[place] MAIN [facility] SERVICES [address] [place], [place], [address] Phone: [phone]   Fax:  [phone]  Physical Therapy Treatment  Patient Details  Name: Donald Riley MRN: [id number] Date of Birth: Apr 28, 1933 Referring Provider (PT): Hortencia Pilar   Encounter Date: 10/18/2019   PT End of Session - 10/18/19 1307    Visit Number 17    Number of Visits 33    Date for PT Re-Evaluation 11/29/19    Authorization Type MCR, recertification 06/04/14    PT Start Time 9678    PT Stop Time 1345    PT Time Calculation (min) 42 min    Equipment Utilized During Treatment Gait belt    Activity Tolerance Patient tolerated treatment well;No increased pain    Behavior During Therapy WFL for tasks assessed/performed           Past Medical History:  Diagnosis Date  . Allergy to alpha-gal   . Aortic stenosis   . Arthritis   . Asthma   . Atrial fibrillation ([place])   . Atrial fibrillation ([place])   . Cancer ([place]) 2002  . CHF (congestive heart failure) ([place])   . GERD (gastroesophageal reflux disease)   . Glaucoma   . Gout   . Hypertension   . Neuropathy   . Osteoporosis   . Sleep apnea     Past Surgical History:  Procedure Laterality Date  . ARTHROPLASTY Right    ankle  . BUNIONECTOMY    . CARDIAC CATHETERIZATION N/A 07/02/2015   Procedure: Right/Left Heart Cath and Coronary Angiography;  Surgeon: Teodoro Spray, MD;  Location: [place] [facility];  Service: Cardiovascular;  Laterality: N/A;  . CORONARY/GRAFT ACUTE MI REVASCULARIZATION N/A 10/02/2016   Procedure: Coronary/Graft Acute MI Revascularization;  Surgeon: Wellington Hampshire, MD;  Location: [place] [facility];  Service: Cardiovascular;  Laterality: N/A;  . JOINT REPLACEMENT    . ostatectomy      There were no vitals filed for this visit.   Subjective Assessment - 10/18/19 1305    Subjective Pt reports that he is doing better today. He had a shot in his R eye yesterday for  macular degeneration. Still complains of shortness of breath and is going for a chest CT tomorrow. He denies any pain in his low back. No specific questions or concerns currently.    Pertinent History Patient fell and had a pelvic fracture in Nov 2011, then he fell in march , had fx ribs and went to [facility] to recover. He has been in bed from being in the nursing home. He had HHPT but still is having weakness in BLE. He has back and hip pain.    Limitations Other (comment)    How long can you sit comfortably? unlimited    How long can you stand comfortably? less than 15 minutes    How long can you walk comfortably? intermediate distances    Diagnostic tests MRI shows multiple lumbar segment stenosis/facet degeneration    Patient Stated Goals improve strength and walking    Currently in Pain? No/denies            TREATMENT     Therapeutic Exercises Nu-Step L2-3 x 5 mins for warm-up during history with therapist adjusting resistance throughout to change intensity (1 minutes unbilled); Precor BLE leg press 85# x 20;? Sit to stand with Airex pad on seat x 10 with pt requiring min A+1 assist, hands placed on knees, needed L UE  support sometimes;  Neuromuscular Re-education  4 square stepping CW/CCW x 3 each direction without UE support, cued to take big steps and not step on the lines; Standing on airex with alternating LE 6" step taps x 10 each leg with just L UE support, Standing on airex with alternating LE 6" step taps x 5 each leg with no UE support Feet apart standing on airex, R hand basketball shots  x multiple bouts each, patient able to reach and maintain balance. Standing one foot on airex, one foot on 4 inch step, BUE ball pass side/side x10 reps each foot on step, with on and off L UE support.     Pt educated throughout session about proper posture and technique with exercises. Improved exercise technique, movement at target joints, use of target muscles after min to mod  verbal, visual, tactile cues.      Pt demonstrates excellent motivation during session today. Most of the session focused on balance with a few strengthening exercises. He still continues to struggle with sit to stands as he needed to use L UE intermittent throughout the 10 reps. Balance exercises progressed to more difficult activities as his balance has improved. Pt encouraged to continue HEP and follow-up as scheduled. Pt will benefit from PT services to address deficits in strength, balance, and mobility in order to return to full function at home.            PT Short Term Goals - 10/04/19 1317      PT SHORT TERM GOAL #1   Title Patient will be adherent to HEP at least 3x a week to improve functional strength and balance for better safety at home.    Baseline Performing exercises everyday    Time 4    Period Weeks    Status Achieved    Target Date 09/12/19             PT Long Term Goals - 10/04/19 1317      PT LONG TERM GOAL #1   Title Patient will increase BLE gross strength to 4+/5 as to improve functional strength for independent gait, increased standing tolerance and increased ADL ability.    Baseline Straight plane hip extension and abdusction remain limited, but not tested at eval.    Time 8    Period Weeks    Status Achieved      PT LONG TERM GOAL #2   Title Patient will increase 10 meter walk test to >1.32ms as to improve gait speed for better community ambulation and to reduce fall risk.    Baseline 08/15/19=0.33 m/sec; 09/18/19: 0.532m    Time 8    Period Weeks    Status Partially Met    Target Date 11/29/19      PT LONG TERM GOAL #3   Title Patient (> 6070ears old) will complete five times sit to stand test in < 15 seconds indicating an increased LE strength and improved balance.    Baseline 08/15/19=  25.20 sec; on 6/21: 19.9sec    Time 8    Period Weeks    Status Partially Met    Target Date 11/29/19      PT LONG TERM GOAL #4   Title Patient will  be mod I when negotiating a curb with LRAD to exhibit improved gait ability and safety in community.     Baseline Per pt report pt has been able to navigate curbs in the community when his wife drops him off.  Time 8    Period Weeks    Status Achieved                 Plan - 10/18/19 1308    Clinical Impression Statement Pt demonstrates excellent motivation during session today. Most of the session focused on balance with a few strengthening exercises. He still continues to struggle with sit to stands as he needed to use L UE intermittent throughout the 10 reps. Balance exercises progressed to more difficult activities as his balance has improved. Pt encouraged to continue HEP and follow-up as scheduled. Pt will benefit from PT services to address deficits in strength, balance, and mobility in order to return to full function at home.    Personal Factors and Comorbidities Age;Comorbidity 1;Comorbidity 2    Comorbidities spinal stenosis, back pain, neuropathy    Examination-Activity Limitations Bed Mobility    Examination-Participation Restrictions Driving    Stability/Clinical Decision Making Evolving/Moderate complexity    Rehab Potential Fair    Clinical Impairments Affecting Rehab Potential motivated; concerned about progressive stenosis with increased numbness in legs;     PT Frequency 2x / week    PT Duration 8 weeks    PT Treatment/Interventions Aquatic Therapy;Cryotherapy;Electrical Stimulation;Moist Heat;Therapeutic exercise;Therapeutic activities;Functional mobility training;Stair training;Gait training;Balance training;Neuromuscular re-education;Patient/family education;Orthotic Fit/Training;Manual techniques;Energy conservation    PT Next Visit Plan Ladder in parallel bars; Soccer ball kicks in bars - standing    PT Home Exercise Plan seated marching, hamstring curls, and hip abduction with green tband; trunk rotation and single knee to chest in AM and PM, Standing mini  squats; seated heel and toe raises    Consulted and Agree with Plan of Care Patient           Patient will benefit from skilled therapeutic intervention in order to improve the following deficits and impairments:  Decreased endurance, Decreased activity tolerance, Decreased strength, Pain, Decreased balance, Decreased mobility, Difficulty walking, Postural dysfunction, Impaired flexibility, Decreased safety awareness  Visit Diagnosis: Other abnormalities of gait and mobility  Muscle weakness (generalized)     Problem List Patient Active Problem List   Diagnosis Date Noted  . Pelvis fracture ([place]) 02/24/2019  . Atrial fibrillation, chronic ([place]) 02/24/2019  . Essential hypertension 02/24/2019  . Coronary artery disease due to lipid rich plaque 02/24/2019  . Acute on chronic systolic CHF (congestive heart failure) ([place]) 02/24/2019  . Aortic stenosis 02/24/2019  . Pelvic fracture ([place]) 02/24/2019  . B12 deficiency 07/01/2018  . Elevated alkaline phosphatase level 06/16/2018  . Macrocytosis 06/16/2018  . Acute ST elevation myocardial infarction (STEMI) of anterolateral wall ([place])   . Angio-edema   . Nonsustained ventricular tachycardia ([place])   . Acute respiratory failure ([place])   . OSA (obstructive sleep apnea)   . Macroglossia   . Non-STEMI (non-ST elevated myocardial infarction) ([place])   . Anaphylaxis 09/30/2016    This entire session was performed under direct supervision and direction of a licensed therapist/therapist assistant . I have personally read, edited and approve of the note as written.   Noemi Chapel, SPT Phillips Grout PT, DPT, GCS  Huprich,Jason 10/18/2019, 2:58 PM  [place] MAIN [facility] SERVICES [address] [place], [place], [address] Phone: [phone]   Fax:  [phone]  Name: Donald Riley MRN: [id number] Date of Birth: 1934-02-11

## 2019-10-19 ENCOUNTER — Other Ambulatory Visit: Payer: Self-pay

## 2019-10-19 ENCOUNTER — Ambulatory Visit
Admission: RE | Admit: 2019-10-19 | Discharge: 2019-10-19 | Disposition: A | Discharge status: Home / Self Care | Payer: Medicare Other | Source: Ambulatory Visit | Attending: Cardiology | Admitting: Cardiology

## 2019-10-19 DIAGNOSIS — I5023 Acute on chronic systolic (congestive) heart failure: Secondary | ICD-10-CM | POA: Diagnosis present

## 2019-10-23 ENCOUNTER — Other Ambulatory Visit: Payer: Self-pay

## 2019-10-23 ENCOUNTER — Ambulatory Visit: Payer: Medicare Other

## 2019-10-23 DIAGNOSIS — R2689 Other abnormalities of gait and mobility: Secondary | ICD-10-CM | POA: Diagnosis not present

## 2019-10-23 DIAGNOSIS — M6281 Muscle weakness (generalized): Secondary | ICD-10-CM

## 2019-10-23 NOTE — Therapy (Signed)
[place] MAIN [facility] SERVICES [address] [place], [place], [address] Phone: [phone]   Fax:  [phone]  Physical Therapy Treatment  Patient Details  Name: Donald Riley MRN: [id number] Date of Birth: 09/03/1933 Referring Provider (PT): Hortencia Pilar   Encounter Date: 10/23/2019   PT End of Session - 10/23/19 1308    Visit Number 18    Number of Visits 33    Date for PT Re-Evaluation 11/29/19    Authorization Type MCR, recertification 09/01/31    PT Start Time 1304    PT Stop Time 1345    PT Time Calculation (min) 41 min    Equipment Utilized During Treatment Gait belt    Activity Tolerance Patient tolerated treatment well;No increased pain    Behavior During Therapy WFL for tasks assessed/performed           Past Medical History:  Diagnosis Date  . Allergy to alpha-gal   . Aortic stenosis   . Arthritis   . Asthma   . Atrial fibrillation ([place])   . Atrial fibrillation ([place])   . Cancer ([place]) 2002  . CHF (congestive heart failure) ([place])   . GERD (gastroesophageal reflux disease)   . Glaucoma   . Gout   . Hypertension   . Neuropathy   . Osteoporosis   . Sleep apnea     Past Surgical History:  Procedure Laterality Date  . ARTHROPLASTY Right    ankle  . BUNIONECTOMY    . CARDIAC CATHETERIZATION N/A 07/02/2015   Procedure: Right/Left Heart Cath and Coronary Angiography;  Surgeon: Teodoro Spray, MD;  Location: [place] [facility];  Service: Cardiovascular;  Laterality: N/A;  . CORONARY/GRAFT ACUTE MI REVASCULARIZATION N/A 10/02/2016   Procedure: Coronary/Graft Acute MI Revascularization;  Surgeon: Wellington Hampshire, MD;  Location: [place] [facility];  Service: Cardiovascular;  Laterality: N/A;  . JOINT REPLACEMENT    . ostatectomy      There were no vitals filed for this visit.   Subjective Assessment - 10/23/19 1306    Subjective Pt reports that he is doing better today. Shortness of breath has improved a little bit  today. The chest CT was abnormal and will go in for more tests. No specific questions or concerns currently.    Pertinent History Patient fell and had a pelvic fracture in Nov 2011, then he fell in march , had fx ribs and went to [facility] to recover. He has been in bed from being in the nursing home. He had HHPT but still is having weakness in BLE. He has back and hip pain.    Limitations Other (comment)    How long can you sit comfortably? unlimited    How long can you stand comfortably? less than 15 minutes    How long can you walk comfortably? intermediate distances    Diagnostic tests MRI shows multiple lumbar segment stenosis/facet degeneration    Patient Stated Goals improve strength and walking    Currently in Pain? No/denies              TREATMENT      Therapeutic Exercises Nu-Step L2-3 x 5 mins for warm-up during history with therapist adjusting resistance throughout to change intensity (1 minutes unbilled); Precor BLE leg press 85# x 20;? Matrix forward resisted gait with rollator 7.5# x 3; Sit to stand with Airex pad on seat x 10 with pt requiring no A to min A+1 assist, hands placed on knees, did  not use L UE support; Step ups onto 6" step with UE support x 10 each leg, Heel/Toe raises x 20 each, able to clear floor, UE support needed.  Standing exercises with 5# ankle weights: UE support needed, cued for posture and to correct form; Hip flexion marches x 20; Hip abduction x 15 each; Hip extension x 15 each; Seated LAQ with 3 second holds x 10 each LE    Neuromuscular Re-education  Standing on airex with alternating LE 6" step taps x 10 each leg with no UE support, Standing on airex with shooting hoops x multiple bouts,   Pt educated throughout session about proper posture and technique with exercises. Improved exercise technique, movement at target joints, use of target muscles after min to mod verbal, visual, tactile cues.      Pt demonstrates excellent  motivation during session today. Most of the session focused on strengthening with a few balance exercises. Patient has improved with sit to stands as he did not require assistance for a few reps. Patient increased weight during strengthening exercises, showing increased strength. Balance exercises progressed to more dynamic activities as his balance has improved. Pt encouraged to continue HEP and follow-up as scheduled. Pt will benefit from PT services to address deficits in strength, balance, and mobility in order to return to full function at home.         PT Short Term Goals - 10/04/19 1317      PT SHORT TERM GOAL #1   Title Patient will be adherent to HEP at least 3x a week to improve functional strength and balance for better safety at home.    Baseline Performing exercises everyday    Time 4    Period Weeks    Status Achieved    Target Date 09/12/19             PT Long Term Goals - 10/04/19 1317      PT LONG TERM GOAL #1   Title Patient will increase BLE gross strength to 4+/5 as to improve functional strength for independent gait, increased standing tolerance and increased ADL ability.    Baseline Straight plane hip extension and abdusction remain limited, but not tested at eval.    Time 8    Period Weeks    Status Achieved      PT LONG TERM GOAL #2   Title Patient will increase 10 meter walk test to >1.33ms as to improve gait speed for better community ambulation and to reduce fall risk.    Baseline 08/15/19=0.33 m/sec; 09/18/19: 0.531m    Time 8    Period Weeks    Status Partially Met    Target Date 11/29/19      PT LONG TERM GOAL #3   Title Patient (> 6066ears old) will complete five times sit to stand test in < 15 seconds indicating an increased LE strength and improved balance.    Baseline 08/15/19=  25.20 sec; on 6/21: 19.9sec    Time 8    Period Weeks    Status Partially Met    Target Date 11/29/19      PT LONG TERM GOAL #4   Title Patient will be mod I  when negotiating a curb with LRAD to exhibit improved gait ability and safety in community.     Baseline Per pt report pt has been able to navigate curbs in the community when his wife drops him off.    Time 8    Period Weeks  Status Achieved                 Plan - 10/23/19 1308    Clinical Impression Statement Pt demonstrates excellent motivation during session today. Most of the session focused on strengthening with a few balance exercises. Patient has improved with sit to stands as he did not require assistance for a few reps. Patient increased weight during strengthening exercises, showing increased strength. Balance exercises progressed to more dynamic activities as his balance has improved. Pt encouraged to continue HEP and follow-up as scheduled. Pt will benefit from PT services to address deficits in strength, balance, and mobility in order to return to full function at home.    Personal Factors and Comorbidities Age;Comorbidity 1;Comorbidity 2    Comorbidities spinal stenosis, back pain, neuropathy    Examination-Activity Limitations Bed Mobility    Examination-Participation Restrictions Driving    Stability/Clinical Decision Making Evolving/Moderate complexity    Rehab Potential Fair    Clinical Impairments Affecting Rehab Potential motivated; concerned about progressive stenosis with increased numbness in legs;     PT Frequency 2x / week    PT Duration 8 weeks    PT Treatment/Interventions Aquatic Therapy;Cryotherapy;Electrical Stimulation;Moist Heat;Therapeutic exercise;Therapeutic activities;Functional mobility training;Stair training;Gait training;Balance training;Neuromuscular re-education;Patient/family education;Orthotic Fit/Training;Manual techniques;Energy conservation    PT Next Visit Plan Ladder in parallel bars; Soccer ball kicks in bars - standing    PT Home Exercise Plan seated marching, hamstring curls, and hip abduction with green tband; trunk rotation and  single knee to chest in AM and PM, Standing mini squats; seated heel and toe raises    Consulted and Agree with Plan of Care Patient           Patient will benefit from skilled therapeutic intervention in order to improve the following deficits and impairments:  Decreased endurance, Decreased activity tolerance, Decreased strength, Pain, Decreased balance, Decreased mobility, Difficulty walking, Postural dysfunction, Impaired flexibility, Decreased safety awareness  Visit Diagnosis: Other abnormalities of gait and mobility  Muscle weakness (generalized)     Problem List Patient Active Problem List   Diagnosis Date Noted  . Pelvis fracture ([place]) 02/24/2019  . Atrial fibrillation, chronic ([place]) 02/24/2019  . Essential hypertension 02/24/2019  . Coronary artery disease due to lipid rich plaque 02/24/2019  . Acute on chronic systolic CHF (congestive heart failure) ([place]) 02/24/2019  . Aortic stenosis 02/24/2019  . Pelvic fracture ([place]) 02/24/2019  . B12 deficiency 07/01/2018  . Elevated alkaline phosphatase level 06/16/2018  . Macrocytosis 06/16/2018  . Acute ST elevation myocardial infarction (STEMI) of anterolateral wall ([place])   . Angio-edema   . Nonsustained ventricular tachycardia ([place])   . Acute respiratory failure ([place])   . OSA (obstructive sleep apnea)   . Macroglossia   . Non-STEMI (non-ST elevated myocardial infarction) ([place])   . Anaphylaxis 09/30/2016    This entire session was performed under direct supervision and direction of a licensed therapist/therapist assistant . I have personally read, edited and approve of the note as written.   Noemi Chapel, SPT [facility] Grout PT, DPT, GCS  Huprich,Jason 10/23/2019, 4:21 PM  [place] MAIN [facility] SERVICES [address] [place], [place], [address] Phone: [phone]   Fax:  [phone]  Name: PEPPER WYNDHAM MRN: [id number] Date of Birth: 24-Jul-1933

## 2019-10-25 ENCOUNTER — Other Ambulatory Visit: Payer: Self-pay

## 2019-10-25 ENCOUNTER — Ambulatory Visit: Payer: Medicare Other

## 2019-10-25 DIAGNOSIS — R2689 Other abnormalities of gait and mobility: Secondary | ICD-10-CM

## 2019-10-25 DIAGNOSIS — M6281 Muscle weakness (generalized): Secondary | ICD-10-CM

## 2019-10-25 NOTE — Therapy (Signed)
[place] MAIN [facility] SERVICES [address] [place], [place], [address] Phone: [phone]   Fax:  [phone]  Physical Therapy Treatment  Patient Details  Name: Donald Riley MRN: [id number] Date of Birth: 08-15-1933 Referring Provider (PT): Hortencia Pilar   Encounter Date: 10/25/2019   PT End of Session - 10/25/19 1304    Visit Number 19    Number of Visits 33    Date for PT Re-Evaluation 11/29/19    Authorization Type MCR, recertification 10/01/23    PT Start Time 1300    PT Stop Time 1345    PT Time Calculation (min) 45 min    Equipment Utilized During Treatment Gait belt    Activity Tolerance Patient tolerated treatment well;No increased pain    Behavior During Therapy WFL for tasks assessed/performed           Past Medical History:  Diagnosis Date   Allergy to alpha-gal    Aortic stenosis    Arthritis    Asthma    Atrial fibrillation ([place])    Atrial fibrillation ([place])    Cancer ([place]) 2002   CHF (congestive heart failure) (HCC)    GERD (gastroesophageal reflux disease)    Glaucoma    Gout    Hypertension    Neuropathy    Osteoporosis    Sleep apnea     Past Surgical History:  Procedure Laterality Date   ARTHROPLASTY Right    ankle   BUNIONECTOMY     CARDIAC CATHETERIZATION N/A 07/02/2015   Procedure: Right/Left Heart Cath and Coronary Angiography;  Surgeon: Teodoro Spray, MD;  Location: [place] [facility];  Service: Cardiovascular;  Laterality: N/A;   CORONARY/GRAFT ACUTE MI REVASCULARIZATION N/A 10/02/2016   Procedure: Coronary/Graft Acute MI Revascularization;  Surgeon: Wellington Hampshire, MD;  Location: [place] [facility];  Service: Cardiovascular;  Laterality: N/A;   JOINT REPLACEMENT     ostatectomy      There were no vitals filed for this visit.   Subjective Assessment - 10/25/19 1303    Subjective Pt reports that he is doing better today. Shortness of breath has improved a little bit  yesterday and today, but has not done anything differnet. No specific questions or concerns currently.    Pertinent History Patient fell and had a pelvic fracture in Nov 2011, then he fell in march , had fx ribs and went to [facility] to recover. He has been in bed from being in the nursing home. He had HHPT but still is having weakness in BLE. He has back and hip pain.    Limitations Other (comment)    How long can you sit comfortably? unlimited    How long can you stand comfortably? less than 15 minutes    How long can you walk comfortably? intermediate distances    Diagnostic tests MRI shows multiple lumbar segment stenosis/facet degeneration    Patient Stated Goals improve strength and walking    Currently in Pain? No/denies               TREATMENT      Therapeutic Exercises Nu-Step L2-3 x 5 mins for warm-up during history with therapist adjusting resistance throughout to change intensity (1 minutes unbilled); Precor BLE leg press 70# x 20, decreased weight due to back pain. Sit to stand with Airex pad on seat x 10 with pt requiring no A to min A+1 assist, hands out in front, did not use L UE support; Step  ups onto 6 step with UE support x 15 each leg, patient short of breath afterwards; Heel/Toe raises x 20 each, able to clear floor, UE support needed, cued for lifting toes up higher.   Exercises with 5# ankle weights: UE support needed, cued for posture and to correct form; Hip flexion marches x 20; (sitting) Standing Hip abduction x 20 each; Standing Hip extension x 20 each; Seated LAQ with 3 second holds x 12 each LE     Neuromuscular Re-education  4 square stepping CW/CCW x 3 each direction without UE support, cued to take big steps and not step on the lines; Standing one foot on airex, one foot on 6 inch step, 1kg ball tosses to PT with student PT guarding x multiple bouts each, lost balance 4 times.    Pt educated throughout session about proper posture and  technique with exercises. Improved exercise technique, movement at target joints, use of target muscles after min to mod verbal, visual, tactile cues.      Pt demonstrates excellent motivation during session today. Most of the session focused on a combination of strengthening and balance exercises. Frequent rest breaks utilized to help with shortness of breath. Patient's sit to stands continue to improve as he requires very little assistance. Patient increased reps during strengthening exercises, showing increased strength. Balance exercises progressed to more dynamic activities as his balance has improved. The 4 square stepping was more fluid as he was able to take bigger steps. Patient notes his legs feel weak after session today. Will update goals and outcome measures next visit. Pt will benefit from PT services to address deficits in strength, balance, and mobility in order to return to full function at home.             PT Short Term Goals - 10/04/19 1317      PT SHORT TERM GOAL #1   Title Patient will be adherent to HEP at least 3x a week to improve functional strength and balance for better safety at home.    Baseline Performing exercises everyday    Time 4    Period Weeks    Status Achieved    Target Date 09/12/19             PT Long Term Goals - 10/04/19 1317      PT LONG TERM GOAL #1   Title Patient will increase BLE gross strength to 4+/5 as to improve functional strength for independent gait, increased standing tolerance and increased ADL ability.    Baseline Straight plane hip extension and abdusction remain limited, but not tested at eval.    Time 8    Period Weeks    Status Achieved      PT LONG TERM GOAL #2   Title Patient will increase 10 meter walk test to >1.38ms as to improve gait speed for better community ambulation and to reduce fall risk.    Baseline 08/15/19=0.33 m/sec; 09/18/19: 0.513m    Time 8    Period Weeks    Status Partially Met    Target  Date 11/29/19      PT LONG TERM GOAL #3   Title Patient (> 6052ears old) will complete five times sit to stand test in < 15 seconds indicating an increased LE strength and improved balance.    Baseline 08/15/19=  25.20 sec; on 6/21: 19.9sec    Time 8    Period Weeks    Status Partially Met    Target Date  11/29/19      PT LONG TERM GOAL #4   Title Patient will be mod I when negotiating a curb with LRAD to exhibit improved gait ability and safety in community.     Baseline Per pt report pt has been able to navigate curbs in the community when his wife drops him off.    Time 8    Period Weeks    Status Achieved                 Plan - 10/25/19 1305    Clinical Impression Statement Pt demonstrates excellent motivation during session today. Most of the session focused on a combination of strengthening and balance exercises. Frequent rest breaks utilized to help with shortness of breath. Patient's sit to stands continue to improve as he requires very little assistance. Patient increased reps during strengthening exercises, showing increased strength. Balance exercises progressed to more dynamic activities as his balance has improved. The 4 square stepping was more fluid as he was able to take bigger steps. Patient notes his legs feel weak after session today. Will update goals and outcome measures next visit. Pt will benefit from PT services to address deficits in strength, balance, and mobility in order to return to full function at home.    Personal Factors and Comorbidities Age;Comorbidity 1;Comorbidity 2    Comorbidities spinal stenosis, back pain, neuropathy    Examination-Activity Limitations Bed Mobility    Examination-Participation Restrictions Driving    Stability/Clinical Decision Making Evolving/Moderate complexity    Rehab Potential Fair    Clinical Impairments Affecting Rehab Potential motivated; concerned about progressive stenosis with increased numbness in legs;     PT  Frequency 2x / week    PT Duration 8 weeks    PT Treatment/Interventions Aquatic Therapy;Cryotherapy;Electrical Stimulation;Moist Heat;Therapeutic exercise;Therapeutic activities;Functional mobility training;Stair training;Gait training;Balance training;Neuromuscular re-education;Patient/family education;Orthotic Fit/Training;Manual techniques;Energy conservation    PT Next Visit Plan Update goals next visit. Ladder in parallel bars; Soccer ball kicks in bars - standing    PT Home Exercise Plan seated marching, hamstring curls, and hip abduction with green tband; trunk rotation and single knee to chest in AM and PM, Standing mini squats; seated heel and toe raises    Consulted and Agree with Plan of Care Patient           Patient will benefit from skilled therapeutic intervention in order to improve the following deficits and impairments:  Decreased endurance, Decreased activity tolerance, Decreased strength, Pain, Decreased balance, Decreased mobility, Difficulty walking, Postural dysfunction, Impaired flexibility, Decreased safety awareness  Visit Diagnosis: Other abnormalities of gait and mobility  Muscle weakness (generalized)     Problem List Patient Active Problem List   Diagnosis Date Noted   Pelvis fracture ([place]) 02/24/2019   Atrial fibrillation, chronic ([place]) 02/24/2019   Essential hypertension 02/24/2019   Coronary artery disease due to lipid rich plaque 02/24/2019   Acute on chronic systolic CHF (congestive heart failure) ([place]) 02/24/2019   Aortic stenosis 02/24/2019   Pelvic fracture ([place]) 02/24/2019   B12 deficiency 07/01/2018   Elevated alkaline phosphatase level 06/16/2018   Macrocytosis 06/16/2018   Acute ST elevation myocardial infarction (STEMI) of anterolateral wall (HCC)    Angio-edema    Nonsustained ventricular tachycardia (HCC)    Acute respiratory failure (HCC)    OSA (obstructive sleep apnea)    Macroglossia    Non-STEMI (non-ST  elevated myocardial infarction) ([place])    Anaphylaxis 09/30/2016    This entire session was performed under direct supervision  and direction of a licensed Chiropractor . I have personally read, edited and approve of the note as written.    Noemi Chapel, SPT [facility] Grout PT, DPT, GCS  Huprich,Jason 10/25/2019, 5:24 PM  [place] MAIN [facility] SERVICES [address] [place], [place], [address] Phone: [phone]   Fax:  [phone]  Name: GONSALO CUTHBERTSON MRN: [id number] Date of Birth: [birth year]

## 2019-10-30 ENCOUNTER — Other Ambulatory Visit: Payer: Self-pay

## 2019-10-30 ENCOUNTER — Ambulatory Visit: Payer: Medicare Other | Attending: Family Medicine

## 2019-10-30 DIAGNOSIS — M6281 Muscle weakness (generalized): Secondary | ICD-10-CM | POA: Diagnosis present

## 2019-10-30 DIAGNOSIS — R2689 Other abnormalities of gait and mobility: Secondary | ICD-10-CM | POA: Insufficient documentation

## 2019-10-30 NOTE — Therapy (Signed)
[place] MAIN [facility] SERVICES [address] [place], [place], [address] Phone: [phone]   Fax:  [phone]  Physical Therapy Treatment / Progress Note   Dates of reporting period  09/18/19   to   10/30/19  Patient Details  Name: Donald Riley MRN: [id number] Date of Birth: 1933/11/24 Referring Provider (PT): Hortencia Pilar   Encounter Date: 10/30/2019   PT End of Session - 10/30/19 1305    Visit Number 20    Number of Visits 33    Date for PT Re-Evaluation 11/29/19    Authorization Type MCR, recertification 04/01/43; Progress Note: 8/2    PT Start Time 1300    PT Stop Time 1345    PT Time Calculation (min) 45 min    Equipment Utilized During Treatment Gait belt    Activity Tolerance Patient tolerated treatment well;No increased pain    Behavior During Therapy WFL for tasks assessed/performed           Past Medical History:  Diagnosis Date  . Allergy to alpha-gal   . Aortic stenosis   . Arthritis   . Asthma   . Atrial fibrillation ([place])   . Atrial fibrillation ([place])   . Cancer ([place]) 2002  . CHF (congestive heart failure) ([place])   . GERD (gastroesophageal reflux disease)   . Glaucoma   . Gout   . Hypertension   . Neuropathy   . Osteoporosis   . Sleep apnea     Past Surgical History:  Procedure Laterality Date  . ARTHROPLASTY Right    ankle  . BUNIONECTOMY    . CARDIAC CATHETERIZATION N/A 07/02/2015   Procedure: Right/Left Heart Cath and Coronary Angiography;  Surgeon: Teodoro Spray, MD;  Location: [place] [facility];  Service: Cardiovascular;  Laterality: N/A;  . CORONARY/GRAFT ACUTE MI REVASCULARIZATION N/A 10/02/2016   Procedure: Coronary/Graft Acute MI Revascularization;  Surgeon: Wellington Hampshire, MD;  Location: [place] [facility];  Service: Cardiovascular;  Laterality: N/A;  . JOINT REPLACEMENT    . ostatectomy      There were no vitals filed for this visit.   Subjective Assessment - 10/30/19 1304    Subjective  Pt reports that he is doing better today. Shortness of breath is not as bad as it was. No specific questions or concerns currently.    Pertinent History Patient fell and had a pelvic fracture in Nov 2011, then he fell in march , had fx ribs and went to [facility] to recover. He has been in bed from being in the nursing home. He had HHPT but still is having weakness in BLE. He has back and hip pain.    Limitations Other (comment)    How long can you sit comfortably? unlimited    How long can you stand comfortably? less than 15 minutes    How long can you walk comfortably? intermediate distances    Diagnostic tests MRI shows multiple lumbar segment stenosis/facet degeneration    Patient Stated Goals improve strength and walking    Currently in Pain? No/denies                TREATMENT      Therapeutic Exercises Nu-Step L2-3 x 5 mins for warm-up during history with therapist adjusting resistance throughout to change intensity (1 minutes unbilled); Updated goals and performed outcome measures below. 10MWT: self-selected: 24 secs: 0.416 m/s, fastest: 21 secs: 0.476 m/s 5TSTS: 32 secs, used UE as needed Precor BLE leg press  70# x 20; Step ups onto 6" step with UE support x 15 each leg, patient short of breath afterwards;    Exercises with 5# ankle weights: UE support needed, cued for posture and to correct form; Alternating Hip flexion marches x 30 total; (sitting) Standing Hip abduction x 20 each; cued to keep foot pointed straight Standing Hip extension x 20 each; Seated LAQ with 3 second holds x 15 each LE Heel/Toe raises x 20 each, able to clear floor, UE support needed, cued for lifting toes up higher.     Neuromuscular Re-education  4 square stepping CW/CCW x 3 each direction without UE support, cued to take big steps and not step on the lines; Standing one foot on airex, one foot on 6 inch step, 1kg ball tosses to PT with student PT guarding x multiple bouts each, lost  balance 4 times.     Pt educated throughout session about proper posture and technique with exercises. Improved exercise technique, movement at target joints, use of target muscles after min to mod verbal, visual, tactile cues.      Pt demonstrates excellent motivation during session today. Updated goals today. Patient performed 5TSTS in 32 seconds with use of hands. On 10MWT, self-selected pace was 0.416 m/s and fastest pace was 0.476 m/s. Both the 5TSTS and 10MWT are worse than previous progress note. Patient notes that he feels weaker the past couple of weeks. Most of the session focused strengthening and a few balance exercises. Frequent rest breaks utilized to help with shortness of breath. The 4 square stepping was less fluid today as he needed to use support with loss of balance. Pt will benefit from PT services to address deficits in strength, balance, and mobility in order to return to full function at home.           PT Short Term Goals - 10/04/19 1317      PT SHORT TERM GOAL #1   Title Patient will be adherent to HEP at least 3x a week to improve functional strength and balance for better safety at home.    Baseline Performing exercises everyday    Time 4    Period Weeks    Status Achieved    Target Date 09/12/19             PT Long Term Goals - 10/30/19 1455      PT LONG TERM GOAL #1   Title Patient will increase BLE gross strength to 4+/5 as to improve functional strength for independent gait, increased standing tolerance and increased ADL ability.    Baseline Straight plane hip extension and abdusction remain limited, but not tested at eval.    Time 8    Period Weeks    Status Achieved      PT LONG TERM GOAL #2   Title Patient will increase 10 meter walk test to >1.63m/s as to improve gait speed for better community ambulation and to reduce fall risk.    Baseline 08/15/19=0.33 m/sec; 09/18/19: 0.77m/s; 10/30/19: 0.476 m/s    Time 8    Period Weeks    Status  On-going      PT LONG TERM GOAL #3   Title Patient (> 12 years old) will complete five times sit to stand test in < 15 seconds indicating an increased LE strength and improved balance.    Baseline 08/15/19=  25.20 sec; on 6/21: 19.9sec; 10/30/19: 32 secs    Time 8    Period Weeks  Status On-going      PT LONG TERM GOAL #4   Title Patient will be mod I when negotiating a curb with LRAD to exhibit improved gait ability and safety in community.     Baseline Per pt report pt has been able to navigate curbs in the community when his wife drops him off.    Time 8    Period Weeks    Status Achieved                 Plan - 10/30/19 1305    Clinical Impression Statement Pt demonstrates excellent motivation during session today. Updated goals today. Patient performed 5TSTS in 32 seconds with use of hands. On 10MWT, self-selected pace was 0.416 m/s and fastest pace was 0.476 m/s. Both the 5TSTS and 10MWT are worse than previous progress note. Patient notes that he feels weaker the past couple of weeks. Most of the session focused strengthening and a few balance exercises. Frequent rest breaks utilized to help with shortness of breath. The 4 square stepping was less fluid today as he needed to use support with loss of balance. Pt will benefit from PT services to address deficits in strength, balance, and mobility in order to return to full function at home.    Personal Factors and Comorbidities Age;Comorbidity 1;Comorbidity 2    Comorbidities spinal stenosis, back pain, neuropathy    Examination-Activity Limitations Bed Mobility    Examination-Participation Restrictions Driving    Stability/Clinical Decision Making Evolving/Moderate complexity    Rehab Potential Fair    Clinical Impairments Affecting Rehab Potential motivated; concerned about progressive stenosis with increased numbness in legs;     PT Frequency 2x / week    PT Duration 8 weeks    PT Treatment/Interventions Aquatic  Therapy;Cryotherapy;Electrical Stimulation;Moist Heat;Therapeutic exercise;Therapeutic activities;Functional mobility training;Stair training;Gait training;Balance training;Neuromuscular re-education;Patient/family education;Orthotic Fit/Training;Manual techniques;Energy conservation    PT Next Visit Plan Update goals next visit. Ladder in parallel bars; Soccer ball kicks in bars - standing    PT Home Exercise Plan seated marching, hamstring curls, and hip abduction with green tband; trunk rotation and single knee to chest in AM and PM, Standing mini squats; seated heel and toe raises    Consulted and Agree with Plan of Care Patient           Patient will benefit from skilled therapeutic intervention in order to improve the following deficits and impairments:  Decreased endurance, Decreased activity tolerance, Decreased strength, Pain, Decreased balance, Decreased mobility, Difficulty walking, Postural dysfunction, Impaired flexibility, Decreased safety awareness  Visit Diagnosis: Other abnormalities of gait and mobility  Muscle weakness (generalized)     Problem List Patient Active Problem List   Diagnosis Date Noted  . Pelvis fracture ([place]) 02/24/2019  . Atrial fibrillation, chronic ([place]) 02/24/2019  . Essential hypertension 02/24/2019  . Coronary artery disease due to lipid rich plaque 02/24/2019  . Acute on chronic systolic CHF (congestive heart failure) ([place]) 02/24/2019  . Aortic stenosis 02/24/2019  . Pelvic fracture ([place]) 02/24/2019  . B12 deficiency 07/01/2018  . Elevated alkaline phosphatase level 06/16/2018  . Macrocytosis 06/16/2018  . Acute ST elevation myocardial infarction (STEMI) of anterolateral wall ([place])   . Angio-edema   . Nonsustained ventricular tachycardia ([place])   . Acute respiratory failure ([place])   . OSA (obstructive sleep apnea)   . Macroglossia   . Non-STEMI (non-ST elevated myocardial infarction) ([place])   . Anaphylaxis 09/30/2016    This entire  session was performed under direct supervision and  direction of a licensed Chiropractor . I have personally read, edited and approve of the note as written.   Noemi Chapel, SPT [facility] Grout PT, DPT, GCS  Huprich,Jason 10/30/2019, 4:12 PM  [place] MAIN [facility] SERVICES [address] [place], [place], [address] Phone: [phone]   Fax:  [phone]  Name: Donald Riley MRN: [id number] Date of Birth: October 12, 1933

## 2019-11-01 ENCOUNTER — Encounter (INDEPENDENT_AMBULATORY_CARE_PROVIDER_SITE_OTHER): Payer: Self-pay

## 2019-11-01 ENCOUNTER — Ambulatory Visit: Payer: Medicare Other

## 2019-11-01 ENCOUNTER — Other Ambulatory Visit: Payer: Self-pay

## 2019-11-01 DIAGNOSIS — R2689 Other abnormalities of gait and mobility: Secondary | ICD-10-CM

## 2019-11-01 DIAGNOSIS — M6281 Muscle weakness (generalized): Secondary | ICD-10-CM

## 2019-11-01 NOTE — Therapy (Addendum)
[place] MAIN [facility] SERVICES [address] [place], [place], [address] Phone: [phone]   Fax:  [phone]  Physical Therapy Treatment  Patient Details  Name: Donald Riley MRN: [id number] Date of Birth: 04-14-1933 Referring Provider (PT): Hortencia Pilar   Encounter Date: 11/01/2019   PT End of Session - 11/01/19 1302    Visit Number 21    Number of Visits 33    Date for PT Re-Evaluation 11/29/19    Authorization Type MCR, recertification 11/04/48; Progress Note: 8/2    PT Start Time 1300    PT Stop Time 1345    PT Time Calculation (min) 45 min    Equipment Utilized During Treatment Gait belt    Activity Tolerance Patient tolerated treatment well;No increased pain    Behavior During Therapy WFL for tasks assessed/performed           Past Medical History:  Diagnosis Date   Allergy to alpha-gal    Aortic stenosis    Arthritis    Asthma    Atrial fibrillation ([place])    Atrial fibrillation ([place])    Cancer ([place]) 2002   CHF (congestive heart failure) (HCC)    GERD (gastroesophageal reflux disease)    Glaucoma    Gout    Hypertension    Neuropathy    Osteoporosis    Sleep apnea     Past Surgical History:  Procedure Laterality Date   ARTHROPLASTY Right    ankle   BUNIONECTOMY     CARDIAC CATHETERIZATION N/A 07/02/2015   Procedure: Right/Left Heart Cath and Coronary Angiography;  Surgeon: Teodoro Spray, MD;  Location: [place] [facility];  Service: Cardiovascular;  Laterality: N/A;   CORONARY/GRAFT ACUTE MI REVASCULARIZATION N/A 10/02/2016   Procedure: Coronary/Graft Acute MI Revascularization;  Surgeon: Wellington Hampshire, MD;  Location: [place] [facility];  Service: Cardiovascular;  Laterality: N/A;   JOINT REPLACEMENT     ostatectomy      There were no vitals filed for this visit.   Subjective Assessment - 11/01/19 1259    Subjective Pt reports that he is doing better today. Patient has no new updates.  Shortness of breath is about the same. No specific questions or concerns currently.    Pertinent History Patient fell and had a pelvic fracture in Nov 2011, then he fell in march , had fx ribs and went to [facility] to recover. He has been in bed from being in the nursing home. He had HHPT but still is having weakness in BLE. He has back and hip pain.    Limitations Other (comment)    How long can you sit comfortably? unlimited    How long can you stand comfortably? less than 15 minutes    How long can you walk comfortably? intermediate distances    Diagnostic tests MRI shows multiple lumbar segment stenosis/facet degeneration    Patient Stated Goals improve strength and walking    Currently in Pain? No/denies             TREATMENT      Therapeutic Exercises Nu-Step L2-3 x 5 mins for warm-up during history with therapist adjusting resistance throughout to change intensity (1 minutes unbilled); Matrix forward resisted gait with rollator 7.5# x 4;   Neuromuscular Re-education Standing soccer kicks x multiple bouts each leg, patient able to weight shift and kick ball, Balance beam airex tandem walks, with BUE to SUE support x 4 lengths, cues for proper foot positioning;  Balance beam airex side steps, with BUE to SUE support x 3 lengths; Steps ups on 6" step x 10 each foot with BUE support; Standing one foot on airex, one foot on 6 inch step, 1kg ball tosses to PT with student PT guarding x multiple bouts each. Forward ladder steps, both feet in the square x 4 lengths, cued for taking big steps with R foot and not stepping on the rings.   Pt educated throughout session about proper posture and technique with exercises. Improved exercise technique, movement at target joints, use of target muscles after min to mod verbal, visual, tactile cues.     Pt demonstrates excellent motivation during session today. Most of the session focused on balance exercises. Frequent rest breaks utilized to  help with shortness of breath, but not as much today. Patient increased reps during strengthening exercises, showing increased strength. Balance exercises progressed to more dynamic activities as his balance has improved. Added agility ladder, airex balance beam, and agility ladder exercises to challenge balance. Pt will benefit from PT services to address deficits in strength, balance, and mobility in order to return to full function at home.        PT Short Term Goals - 10/04/19 1317      PT SHORT TERM GOAL #1   Title Patient will be adherent to HEP at least 3x a week to improve functional strength and balance for better safety at home.    Baseline Performing exercises everyday    Time 4    Period Weeks    Status Achieved    Target Date 09/12/19             PT Long Term Goals - 10/30/19 1455      PT LONG TERM GOAL #1   Title Patient will increase BLE gross strength to 4+/5 as to improve functional strength for independent gait, increased standing tolerance and increased ADL ability.    Baseline Straight plane hip extension and abdusction remain limited, but not tested at eval.    Time 8    Period Weeks    Status Achieved      PT LONG TERM GOAL #2   Title Patient will increase 10 meter walk test to >1.31m/s as to improve gait speed for better community ambulation and to reduce fall risk.    Baseline 08/15/19=0.33 m/sec; 09/18/19: 0.70m/s; 10/30/19: 0.476 m/s    Time 8    Period Weeks    Status On-going      PT LONG TERM GOAL #3   Title Patient (> 66 years old) will complete five times sit to stand test in < 15 seconds indicating an increased LE strength and improved balance.    Baseline 08/15/19=  25.20 sec; on 6/21: 19.9sec; 10/30/19: 32 secs    Time 8    Period Weeks    Status On-going      PT LONG TERM GOAL #4   Title Patient will be mod I when negotiating a curb with LRAD to exhibit improved gait ability and safety in community.     Baseline Per pt report pt has been able  to navigate curbs in the community when his wife drops him off.    Time 8    Period Weeks    Status Achieved                 Plan - 11/01/19 1303    Clinical Impression Statement Pt demonstrates excellent motivation during session today. Most of the  session focused on balance exercises. Frequent rest breaks utilized to help with shortness of breath, but not as much today. Patient increased reps during strengthening exercises, showing increased strength. Balance exercises progressed to more dynamic activities as his balance has improved. Added agility ladder, airex balance beam, and agility ladder exercises to challenge balance. Pt will benefit from PT services to address deficits in strength, balance, and mobility in order to return to full function at home.    Personal Factors and Comorbidities Age;Comorbidity 1;Comorbidity 2    Comorbidities spinal stenosis, back pain, neuropathy    Examination-Activity Limitations Bed Mobility    Examination-Participation Restrictions Driving    Stability/Clinical Decision Making Evolving/Moderate complexity    Rehab Potential Fair    Clinical Impairments Affecting Rehab Potential motivated; concerned about progressive stenosis with increased numbness in legs;     PT Frequency 2x / week    PT Duration 8 weeks    PT Treatment/Interventions Aquatic Therapy;Cryotherapy;Electrical Stimulation;Moist Heat;Therapeutic exercise;Therapeutic activities;Functional mobility training;Stair training;Gait training;Balance training;Neuromuscular re-education;Patient/family education;Orthotic Fit/Training;Manual techniques;Energy conservation    PT Next Visit Plan Update goals next visit. Ladder in parallel bars; Soccer ball kicks in bars - standing    PT Home Exercise Plan seated marching, hamstring curls, and hip abduction with green tband; trunk rotation and single knee to chest in AM and PM, Standing mini squats; seated heel and toe raises    Consulted and Agree  with Plan of Care Patient           Patient will benefit from skilled therapeutic intervention in order to improve the following deficits and impairments:  Decreased endurance, Decreased activity tolerance, Decreased strength, Pain, Decreased balance, Decreased mobility, Difficulty walking, Postural dysfunction, Impaired flexibility, Decreased safety awareness  Visit Diagnosis: Other abnormalities of gait and mobility  Muscle weakness (generalized)     Problem List Patient Active Problem List   Diagnosis Date Noted   Pelvis fracture ([place]) 02/24/2019   Atrial fibrillation, chronic ([place]) 02/24/2019   Essential hypertension 02/24/2019   Coronary artery disease due to lipid rich plaque 02/24/2019   Acute on chronic systolic CHF (congestive heart failure) ([place]) 02/24/2019   Aortic stenosis 02/24/2019   Pelvic fracture ([place]) 02/24/2019   B12 deficiency 07/01/2018   Elevated alkaline phosphatase level 06/16/2018   Macrocytosis 06/16/2018   Acute ST elevation myocardial infarction (STEMI) of anterolateral wall (HCC)    Angio-edema    Nonsustained ventricular tachycardia (HCC)    Acute respiratory failure (HCC)    OSA (obstructive sleep apnea)    Macroglossia    Non-STEMI (non-ST elevated myocardial infarction) ([place])    Anaphylaxis 09/30/2016    Noemi Chapel, SPT Lyndel Safe Huprich PT, DPT, GCS  Huprich,Jason 11/02/2019, 9:52 AM  [place] [address] [place], [place], [address] Phone: [phone]   Fax:  [phone]  Name: Donald Riley MRN: [id number] Date of Birth: 01/12/1934

## 2019-11-06 ENCOUNTER — Ambulatory Visit: Payer: Medicare Other

## 2019-11-08 ENCOUNTER — Ambulatory Visit: Payer: Medicare Other

## 2019-11-13 ENCOUNTER — Ambulatory Visit: Payer: Medicare Other

## 2019-11-15 ENCOUNTER — Ambulatory Visit: Payer: Medicare Other

## 2019-11-20 ENCOUNTER — Ambulatory Visit: Payer: Medicare Other

## 2019-11-21 ENCOUNTER — Encounter (INDEPENDENT_AMBULATORY_CARE_PROVIDER_SITE_OTHER): Payer: Medicare Other | Admitting: Vascular Surgery

## 2019-11-22 ENCOUNTER — Ambulatory Visit: Payer: Medicare Other

## 2019-11-27 ENCOUNTER — Ambulatory Visit: Payer: Medicare Other

## 2019-11-29 ENCOUNTER — Ambulatory Visit: Payer: Medicare Other

## 2019-11-29 DEATH — deceased

## 2019-12-05 ENCOUNTER — Encounter (INDEPENDENT_AMBULATORY_CARE_PROVIDER_SITE_OTHER): Payer: Medicare Other | Admitting: Vascular Surgery

## 2019-12-06 ENCOUNTER — Ambulatory Visit: Payer: Medicare Other

## 2019-12-11 ENCOUNTER — Ambulatory Visit: Payer: Medicare Other

## 2019-12-13 ENCOUNTER — Ambulatory Visit: Payer: Medicare Other

## 2019-12-18 ENCOUNTER — Ambulatory Visit: Payer: Medicare Other

## 2019-12-20 ENCOUNTER — Ambulatory Visit: Payer: Medicare Other

## 2019-12-25 ENCOUNTER — Ambulatory Visit: Payer: Medicare Other

## 2019-12-27 ENCOUNTER — Ambulatory Visit: Payer: Medicare Other

## 2021-04-03 IMAGING — CT CT CHEST W/O CM
2 of 3 series · 15 of 36 positions shown, 18 images · non-contrast
Comparison: 10/29/2016

CLINICAL DATA: Cough, increasingly short of breath for 1-2 years
history of prostate cancer

EXAM:
CT CHEST WITHOUT CONTRAST
TECHNIQUE: Multidetector CT imaging of the chest was performed following the
standard protocol without IV contrast.

[Series 2: thorax · axial · 0.79mm/px · z∈[-297,-27]mm · 12 of 159 slices shown, 15 images]
[im 12/159  mediastinal]
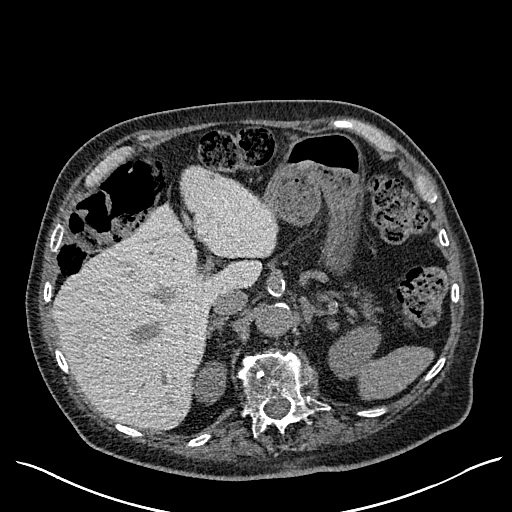
[im 12/159  lung]
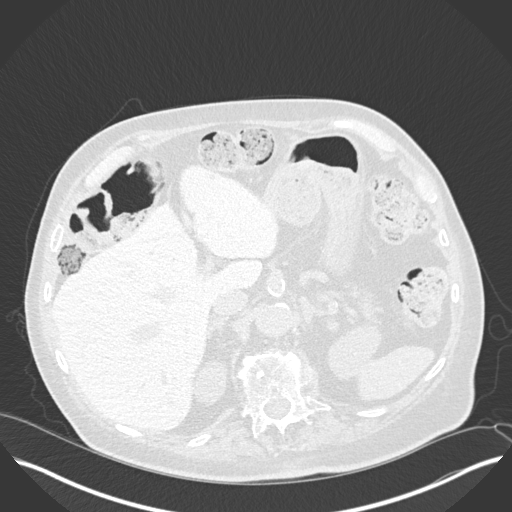
[im 24/159  lung]
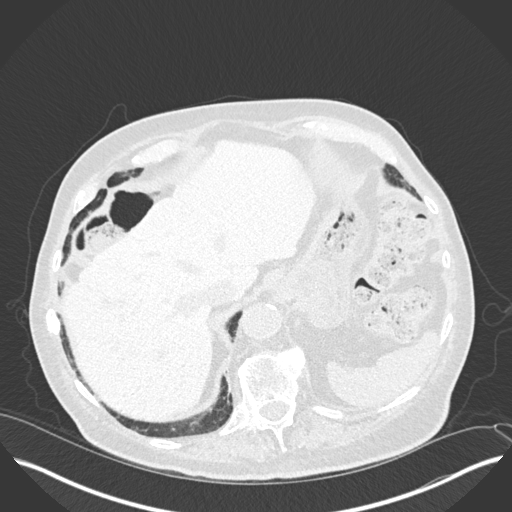
[im 36/159  lung]
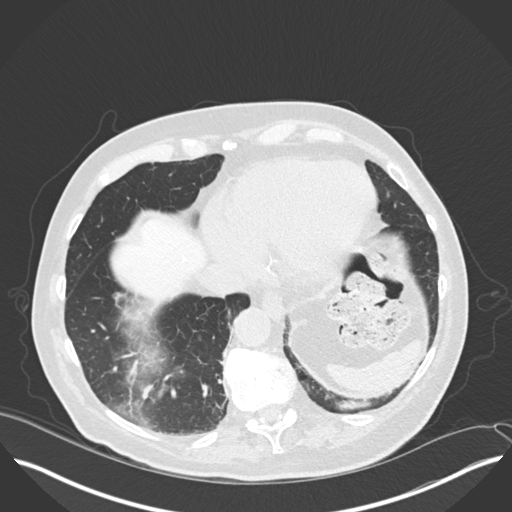
[im 47/159  lung]
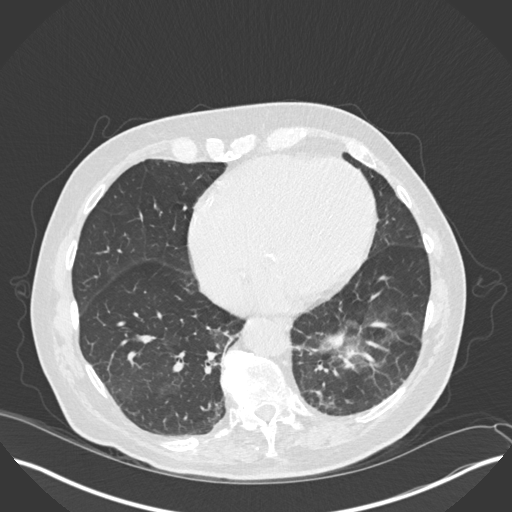
[im 59/159  mediastinal]
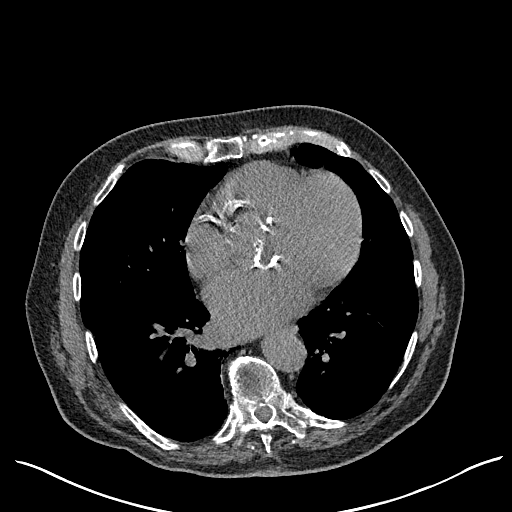
[im 59/159  lung]
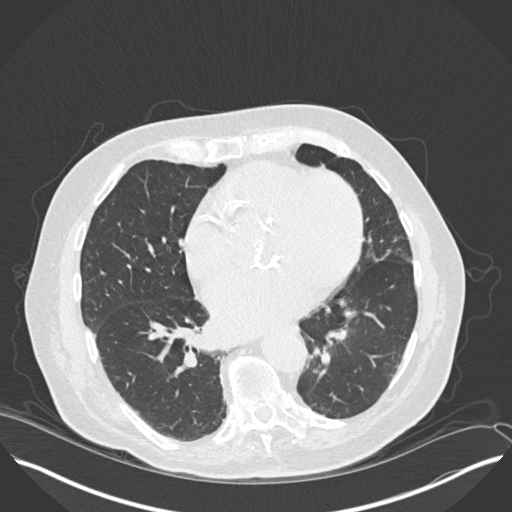
[im 71/159  lung]
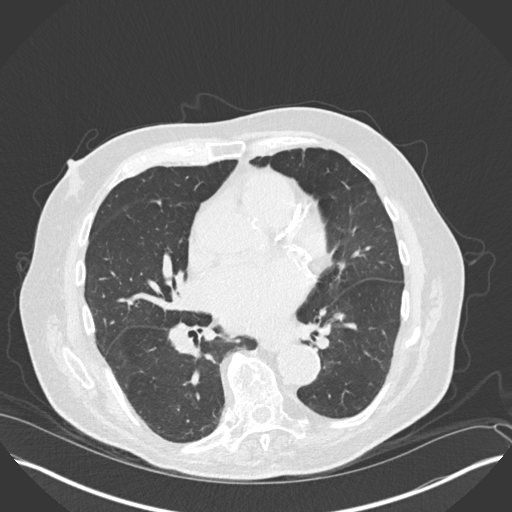
[im 88/159  lung]
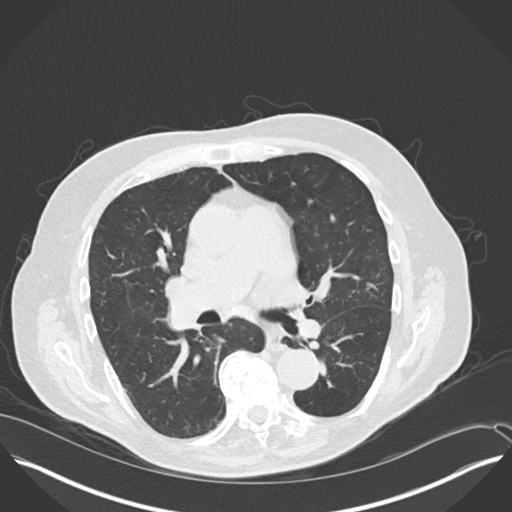
[im 100/159  lung]
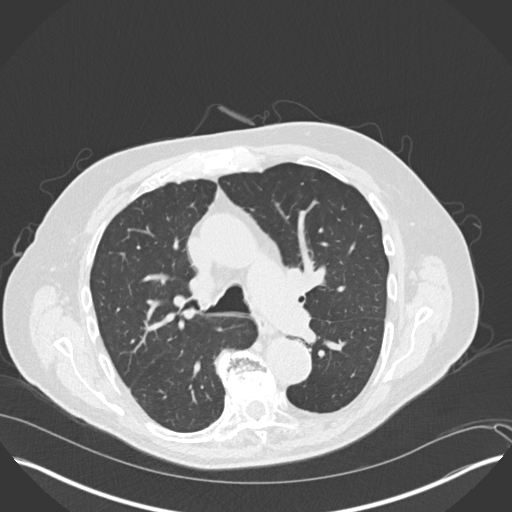
[im 112/159  mediastinal]
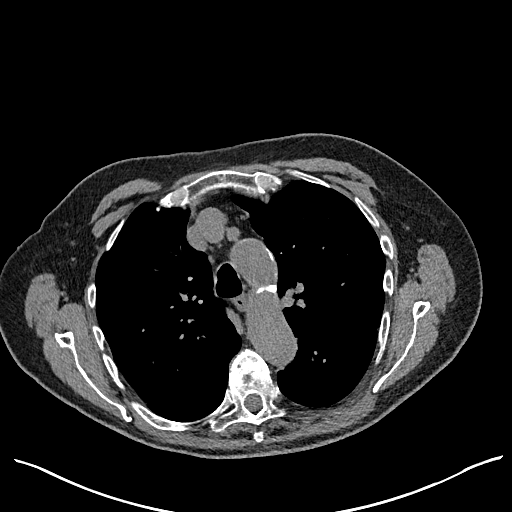
[im 112/159  lung]
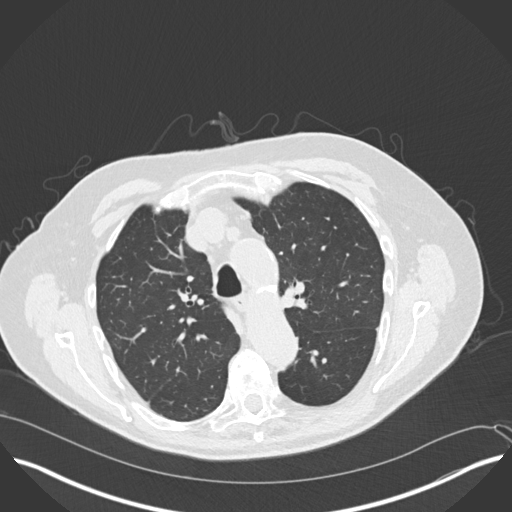
[im 123/159  lung]
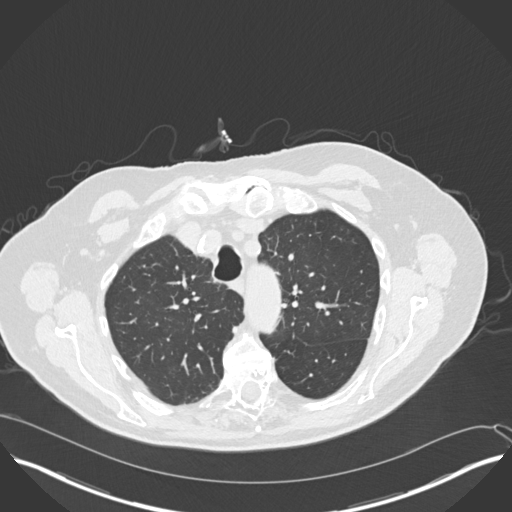
[im 135/159  lung]
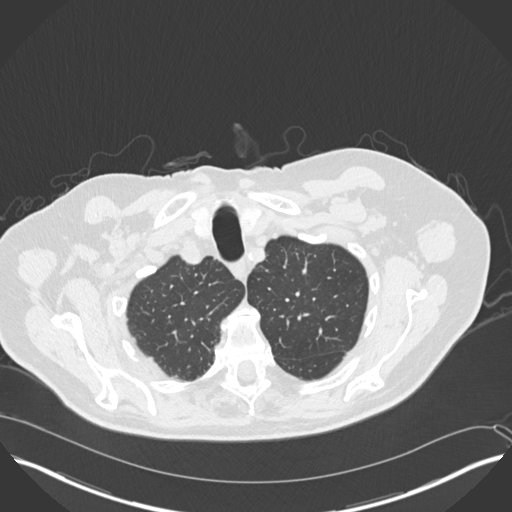
[im 147/159  lung]
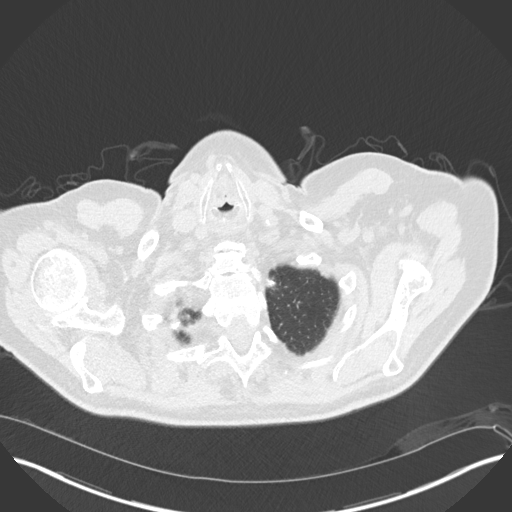

[Series 5: coronal · coronal · 0.65mm/px · 3 of 151 slices shown]
[im 31/151  lung]
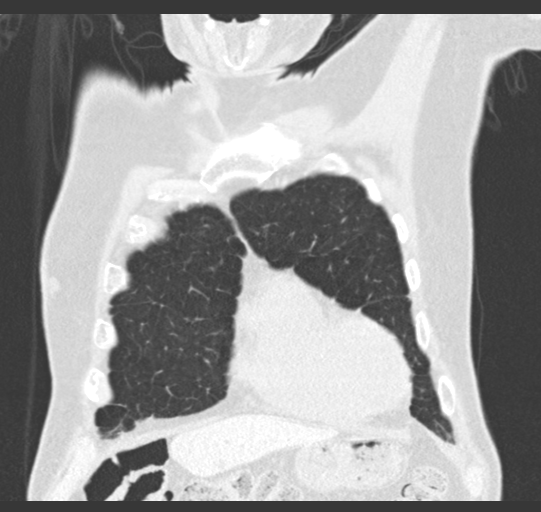
[im 61/151  lung]
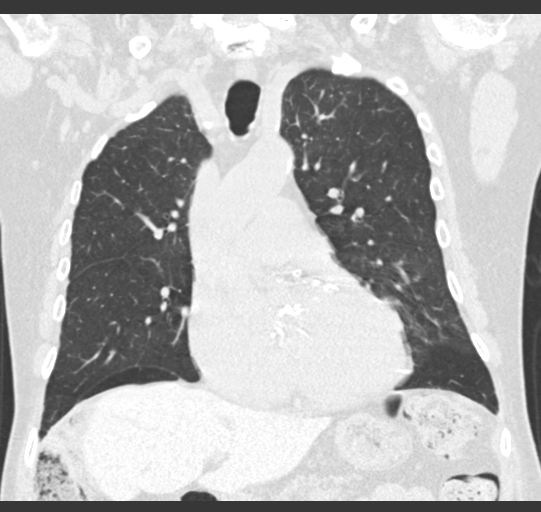
[im 91/151  lung]
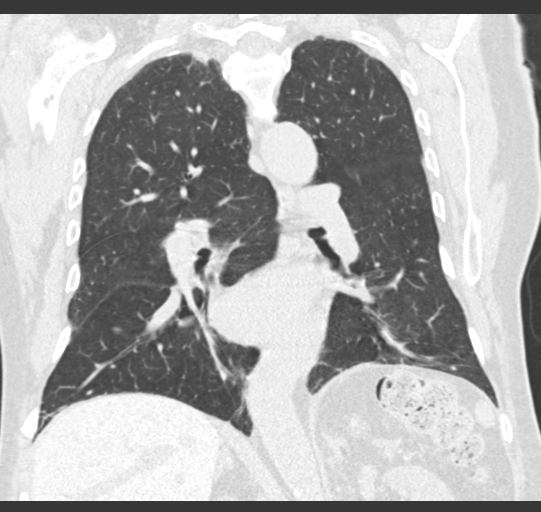

[15 of 36 positions shown; findings below may reference images not displayed]

FINDINGS: Cardiovascular: Calcified atheromatous plaque is of the thoracic
aorta which is tortuous but nonaneurysmal. Heart size is enlarged
without pericardial effusion. Aortic valvular calcifications.
Three-vessel coronary artery disease with signs of prior
percutaneous coronary intervention suggested on today's study.
Vessels not well evaluated given lack of intravenous contrast.

Mediastinum/Nodes: Thoracic inlet structures are normal. No axillary
lymphadenopathy. No mediastinal lymphadenopathy. No hilar
adenopathy, hilar structures with limited assessment due to lack of
intravenous contrast.

Lungs/Pleura: Mild subpleural reticulation is stable compared to the
previous exam. Interval development of small basilar pulmonary
nodules since 5975 at the LEFT and RIGHT lung base. (Image 103,
series 3) 5 mm LEFT lower lobe pulmonary nodule

(Image 86, series [DATE] x 6 mm mildly spiculated appearing RIGHT
lower lobe pulmonary nodule is new as well no consolidation.

LEFT costodiaphragmatic sulcus medially with small 9 x 7 mm
pulmonary nodule. This is contiguous with some atelectatic changes
and there is an adjacent tiny pulmonary nodule seen just cephalad to
this best appreciated on image 105 of the coronal data set. Pleural
and parenchymal scarring at the lung apices. Airways are patent.

Upper Abdomen: Marked hyperdensity of the liver greater than 100
Hounsfield units on noncontrast imaging. Small low-density lesion in
the cephalad aspect of the RIGHT hemi liver likely a small cyst
unchanged. Sludge layers dependent incompletely imaged gallbladder.
No acute upper abdominal process to the extent evaluated.

Musculoskeletal: Subacute or chronic RIGHT posterior rib fracture at
RIGHT posterior tenth and eleventh ribs.
IMPRESSION: 1. Interval development of small basilar pulmonary nodules, findings
may be due to infection or inflammation though metastatic process is
considered. Short interval follow-up is suggested given size and
location of nodules, perhaps in 3 months. PET imaging could
potentially be helpful as well though the largest nodule is just
above the LEFT hemidiaphragm and may show limited assessment on PET.
2. Subacute or chronic RIGHT posterior rib fracture at RIGHT
posterior tenth and eleventh ribs.
3. Marked hyperdensity of the liver greater than 100 Hounsfield
units on noncontrast imaging. This can be seen in the setting of
amiodarone therapy.
4. Aortic atherosclerosis.

Aortic Atherosclerosis ([id number].X).
